# Patient Record
Sex: Female | Born: 1975 | Race: White | Hispanic: No
Health system: Western US, Academic
[De-identification: ages and names within clinical notes are randomized; demographics above are authoritative.]

## PROBLEM LIST (undated history)

## (undated) ENCOUNTER — Emergency Department (EMERGENCY_DEPARTMENT_HOSPITAL): Admission: EM | Payer: Medicare Other

## (undated) DIAGNOSIS — M549 Dorsalgia, unspecified: Secondary | ICD-10-CM

## (undated) DIAGNOSIS — E669 Obesity, unspecified: Secondary | ICD-10-CM

## (undated) DIAGNOSIS — J309 Allergic rhinitis, unspecified: Secondary | ICD-10-CM

## (undated) DIAGNOSIS — G4730 Sleep apnea, unspecified: Secondary | ICD-10-CM

## (undated) DIAGNOSIS — T148XXA Other injury of unspecified body region, initial encounter: Secondary | ICD-10-CM

## (undated) DIAGNOSIS — E119 Type 2 diabetes mellitus without complications: Secondary | ICD-10-CM

## (undated) DIAGNOSIS — I10 Essential (primary) hypertension: Secondary | ICD-10-CM

## (undated) DIAGNOSIS — F3289 Other specified depressive episodes: Secondary | ICD-10-CM

## (undated) DIAGNOSIS — J4489 Other specified chronic obstructive pulmonary disease: Secondary | ICD-10-CM

## (undated) DIAGNOSIS — K458 Other specified abdominal hernia without obstruction or gangrene: Secondary | ICD-10-CM

## (undated) DIAGNOSIS — E079 Disorder of thyroid, unspecified: Secondary | ICD-10-CM

## (undated) DIAGNOSIS — K219 Gastro-esophageal reflux disease without esophagitis: Secondary | ICD-10-CM

## (undated) DIAGNOSIS — J449 Chronic obstructive pulmonary disease, unspecified: Secondary | ICD-10-CM

## (undated) DIAGNOSIS — R519 Headache, unspecified: Secondary | ICD-10-CM

## (undated) DIAGNOSIS — M159 Polyosteoarthritis, unspecified: Secondary | ICD-10-CM

## (undated) DIAGNOSIS — L089 Local infection of the skin and subcutaneous tissue, unspecified: Secondary | ICD-10-CM

## (undated) HISTORY — DX: Morbid (severe) obesity due to excess calories: E66.01

## (undated) HISTORY — PX: PR APPENDECTOMY: 44950

## (undated) HISTORY — DX: Headache, unspecified: R51.9

## (undated) HISTORY — PX: SURGICAL HX OTHER: 99

## (undated) HISTORY — DX: Disorder of thyroid, unspecified: E07.9

## (undated) HISTORY — DX: Polyosteoarthritis, unspecified: M15.9

## (undated) HISTORY — DX: Allergic rhinitis, unspecified: J30.9

## (undated) HISTORY — PX: PR TONSILLECTOMY ONE-HALF <AGE 12: 42825

## (undated) HISTORY — PX: ADRENALECTOMY: SHX5220

## (undated) HISTORY — DX: Other specified chronic obstructive pulmonary disease: J44.89

## (undated) HISTORY — PX: BARIATRIC SURGERY: SHX5068

## (undated) HISTORY — PX: PR CHOLECYSTECTOMY: 47600

## (undated) HISTORY — PX: PR GASTRIC RSTCV W/BYP W/SM INT RCNSTJ LIMIT ABSRPJ: 43847

## (undated) HISTORY — DX: Type 2 diabetes mellitus without complications: E11.9

## (undated) HISTORY — DX: Other specified abdominal hernia without obstruction or gangrene: K45.8

## (undated) HISTORY — PX: PR LIG/TRNSXJ FLP TUBE ABDL/VAG APPR UNI/BI: 58600

## (undated) HISTORY — DX: Local infection of the skin and subcutaneous tissue, unspecified: L08.9

## (undated) HISTORY — DX: Chronic obstructive pulmonary disease, unspecified: J44.9

## (undated) HISTORY — DX: Gastro-esophageal reflux disease without esophagitis: K21.9

## (undated) HISTORY — DX: Other specified depressive episodes: F32.89

## (undated) HISTORY — DX: Sleep apnea, unspecified: G47.30

## (undated) HISTORY — DX: Essential (primary) hypertension: I10

## (undated) HISTORY — DX: Other injury of unspecified body region, initial encounter: T14.8XXA

## (undated) HISTORY — PX: CHOLECYSTECTOMY: SHX55

## (undated) HISTORY — PX: TUBAL LIGATION: SHX77

## (undated) HISTORY — PX: APPENDECTOMY: SHX54

## (undated) HISTORY — PX: GASTRIC BYPASS: SHX52

## (undated) HISTORY — PX: TONSILLECTOMY: SUR1361

## (undated) MED ORDER — BUSPIRONE HCL 10 MG OR TABS
ORAL_TABLET | ORAL | 5 refills | Status: AC
Start: 2021-02-26 — End: ?

## (undated) MED ORDER — MELOXICAM 7.5 MG OR TABS
ORAL_TABLET | ORAL | Status: AC
Start: 2018-12-15 — End: ?

## (undated) MED ORDER — TIZANIDINE HCL 4 MG OR TABS
ORAL_TABLET | ORAL | 5 refills | Status: AC
Start: 2021-02-28 — End: ?

## (undated) MED ORDER — FLUOXETINE HCL 20 MG OR CAPS
ORAL_CAPSULE | ORAL | 4 refills | Status: AC
Start: 2022-01-13 — End: ?

## (undated) MED ORDER — TIZANIDINE HCL 4 MG OR TABS
ORAL_TABLET | ORAL | 5 refills | Status: AC
Start: 2022-02-10 — End: ?

## (undated) MED ORDER — METHOCARBAMOL 750 MG OR TABS
ORAL_TABLET | ORAL | 0 refills | Status: AC
Start: 2019-01-17 — End: ?

## (undated) MED ORDER — BUPROPION HCL ER (XL) 150 MG OR TB24
EXTENDED_RELEASE_TABLET | ORAL | 5 refills | Status: AC
Start: 2019-11-07 — End: ?

## (undated) MED ORDER — PREDNISONE 20 MG OR TABS
ORAL_TABLET | ORAL | 0 refills | Status: AC
Start: 2017-10-07 — End: ?

## (undated) MED ORDER — LIDOCAINE 5 % EX PTCH
MEDICATED_PATCH | CUTANEOUS | 0 refills | Status: AC
Start: 2021-12-20 — End: ?

---

## 2004-04-04 ENCOUNTER — Emergency Department (HOSPITAL_COMMUNITY): Admission: EM | Admit: 2004-04-04 | Discharge: 2004-04-04 | Payer: Self-pay | Admitting: Emergency Medicine

## 2004-10-24 ENCOUNTER — Emergency Department (HOSPITAL_COMMUNITY): Admission: EM | Admit: 2004-10-24 | Discharge: 2004-10-24 | Payer: Self-pay | Admitting: Emergency Medicine

## 2008-12-02 ENCOUNTER — Emergency Department (HOSPITAL_BASED_OUTPATIENT_CLINIC_OR_DEPARTMENT_OTHER): Admission: EM | Admit: 2008-12-02 | Discharge: 2008-12-02 | Payer: Self-pay | Admitting: Emergency Medicine

## 2008-12-17 ENCOUNTER — Emergency Department (HOSPITAL_BASED_OUTPATIENT_CLINIC_OR_DEPARTMENT_OTHER): Admission: EM | Admit: 2008-12-17 | Discharge: 2008-12-17 | Payer: Self-pay | Admitting: Emergency Medicine

## 2009-01-22 ENCOUNTER — Ambulatory Visit (HOSPITAL_COMMUNITY): Admission: RE | Admit: 2009-01-22 | Discharge: 2009-01-22 | Payer: Self-pay | Admitting: Obstetrics and Gynecology

## 2009-01-22 ENCOUNTER — Encounter (INDEPENDENT_AMBULATORY_CARE_PROVIDER_SITE_OTHER): Payer: Self-pay | Admitting: Obstetrics and Gynecology

## 2009-03-25 ENCOUNTER — Emergency Department
Admission: EM | Admit: 2009-03-25 | Discharge: 2009-03-26 | Disposition: A | Discharge status: Home / Self Care | Payer: Medicaid Other | Attending: Emergency Medicine | Admitting: Emergency Medicine

## 2009-03-25 ENCOUNTER — Other Ambulatory Visit (HOSPITAL_BASED_OUTPATIENT_CLINIC_OR_DEPARTMENT_OTHER): Payer: Self-pay | Admitting: Emergency Medicine

## 2009-03-25 DIAGNOSIS — R9431 Abnormal electrocardiogram [ECG] [EKG]: Secondary | ICD-10-CM

## 2009-03-25 DIAGNOSIS — R079 Chest pain, unspecified: Secondary | ICD-10-CM

## 2009-03-26 LAB — X-RAY CHEST 2 VW

## 2009-03-30 ENCOUNTER — Ambulatory Visit (INDEPENDENT_AMBULATORY_CARE_PROVIDER_SITE_OTHER): Payer: Medicaid Other | Admitting: Wound Care

## 2009-03-30 ENCOUNTER — Encounter (INDEPENDENT_AMBULATORY_CARE_PROVIDER_SITE_OTHER): Payer: Self-pay | Admitting: Wound Care

## 2009-03-30 VITALS — BP 118/72 | HR 104 | Temp 98.7°F | Resp 16 | Ht 63.5 in | Wt >= 6400 oz

## 2009-03-30 DIAGNOSIS — R58 Hemorrhage, not elsewhere classified: Secondary | ICD-10-CM

## 2009-03-30 DIAGNOSIS — Z23 Encounter for immunization: Secondary | ICD-10-CM

## 2009-03-30 DIAGNOSIS — F339 Major depressive disorder, recurrent, unspecified: Secondary | ICD-10-CM

## 2009-03-30 DIAGNOSIS — R7309 Other abnormal glucose: Secondary | ICD-10-CM

## 2009-03-30 DIAGNOSIS — A6000 Herpesviral infection of urogenital system, unspecified: Secondary | ICD-10-CM

## 2009-03-30 MED ORDER — WELLBUTRIN SR 150 MG OR TB12
EXTENDED_RELEASE_TABLET | ORAL | Status: DC
Start: 2009-03-30 — End: 2009-08-24

## 2009-03-30 MED ORDER — LEXAPRO 10 MG OR TABS
ORAL_TABLET | ORAL | Status: DC
Start: 2009-03-30 — End: 2009-05-11

## 2009-03-30 NOTE — Progress Notes (Signed)
Why is patient here today? Patient is here to establish care. Patient was refrred to [place] by [facility].   Does patient need refills? Yes.   Does patient need a referral?   Does patient need a letter or need forms filled out?   Lab results?   Please check health mainenance issues and flag for the doctor:

## 2009-03-30 NOTE — Progress Notes (Signed)
Pt is a 33 year old female  here today for bruising and hernia   Reviewed history obtained by MA and agree on 03/30/2009    Hernia- has stomach area hernias for past 2 years back. They are getting bigger.  She has some pain 1-2 times per week. Like a pressure 2-3 hrs . She has a compazine from [place] and it helps. She needs to get this hernia fixed.     Chest pain - she recently was seen at [facility]. They rules out cardiac issue. She has had multiple studies in [place] - barium swallow etc which are neg.   She is too large to fit into a cardiac nuclear study and so that part of the test aws not done.   Her chest pain has resolved.     bruising started a lot. Large bruising spontanesous. Platelets normal at ER. PTT abnormal.   No blood from anywhere else.     Depression lots of death in her family. Parents died when she was young. Since then she has lost everybody she loves. Moved here to be close to a distant cousin. So far she likes it.       No records.     There is no problem list on file for this patient.       Current outpatient prescriptions   Medication Sig   . Albuterol 90 MCG/ACT IN AERS two puff as needed    . BuPROPion HCl (WELLBUTRIN SR) 150 MG OR TB12 once daily    . Eletriptan Hydrobromide (RELPAX) 40 MG OR TABS take one tablet by mouth at onset of headache    . Escitalopram Oxalate (LEXAPRO) 10 MG OR TABS once daily    . Omeprazole 20 MG OR TBEC take two capsules by mouth every day    . Prochlorperazine Maleate 10 MG OR TABS take one tablet by mouth every 6 hours as needed for nausea    . Valacyclovir HCl 1 GM OR TABS Take 1 tablet by mouth daily for supression with outbreaks . Take one tablet twice day daily    . Zolpidem Tartrate (AMBIEN) 10 MG OR TABS take one tablet at bedtime as needed        History   Substance Use Topics   . Tobacco Use: Never   . Alcohol Use: No         O/e  Gen alert    BP 118/72  Pulse 104  Temp(Src) 98.7 F (37.1 C) (Oral)  Resp 16  Ht 5' 3.5" (1.613 m)  Wt 533 lb (241.767 kg)   SpO2 97%  LMP IUD  Morbidly obese.  Arms some bruising seen  Abdomen very diff exam. + hernia seen ventral. No strangulation noted.         ASSESMENT AND PLAN  296.30 DEPRESSIVE DISORDER RECURRENT,NOS  (primary encounter diagnosis)  Comment: needs medication refilled.  Plan: has been stable on medication for longtime per pt.     278.01 MORBID OBESITY  Comment: this is a huge issue for her. Check with bariatric sx   Plan: REFERRAL TO SURGICAL OBESITY TX, REFERRAL TO         NUTRITION, LIPID PANEL, COMPREHENSIVE METABOLIC        PANEL, THYROID STIMULATING HORMONE, A1C,         ONSITE, RAPID        Has non cardiac chest pain but as the ER notes she is a high risk.        Lipids.daily  asa and wt loss are imp. See orders.     790.29 OTHER ABNORMAL GLUCOSE  Comment: Plan: REFERRAL TO SURGICAL OBESITY TX, A1C, ONSITE,         RAPID        Has had high sugars. Check a1c    V04.81 VACCINE FOR INFLUENZA  Comment: Plan: FLU VACCINE, 3 YRS & >, IM, ADMIN INFLUENZA         VIRUS VAC          She is using relpax for headaches. We did not have time to discuss but will Follow up next time  She needs CT head r/o NPH secondary to her obesity.     Greater than 50% of this visit was spent face to face with the patient in counseling and/or coordination of care, and/or discussing treatment/management options, and/or education with the patient and/or family member.  Total time spent was 30 minutes.

## 2009-04-02 ENCOUNTER — Telehealth (INDEPENDENT_AMBULATORY_CARE_PROVIDER_SITE_OTHER): Payer: Self-pay | Admitting: Wound Care

## 2009-04-02 ENCOUNTER — Other Ambulatory Visit (INDEPENDENT_AMBULATORY_CARE_PROVIDER_SITE_OTHER): Payer: Medicaid Other

## 2009-04-02 DIAGNOSIS — F339 Major depressive disorder, recurrent, unspecified: Secondary | ICD-10-CM

## 2009-04-02 DIAGNOSIS — R7309 Other abnormal glucose: Secondary | ICD-10-CM

## 2009-04-02 LAB — PROTHROMBIN & PTT
Partial Thromboplastin Time: 31 s (ref 22–35)
Partial Thromboplastin X Mean: 1.1
Prothrombin INR: 1 (ref 0.8–1.3)
Prothrombin Time Patient: 13.2 s (ref 10.7–15.6)

## 2009-04-02 LAB — COMPREHENSIVE METABOLIC PANEL
ALT (GPT): 16 U/L (ref 6–40)
AST (GOT): 20 U/L (ref 15–40)
Albumin: 3.5 g/dL (ref 3.5–5.2)
Alkaline Phosphatase (Total): 50 U/L (ref 25–100)
Anion Gap: 12 — ABNORMAL HIGH (ref 3–11)
Bilirubin (Total): 0.7 mg/dL (ref 0.2–1.3)
Calcium: 8.9 mg/dL (ref 8.9–10.2)
Carbon Dioxide, Total: 20 mEq/L — ABNORMAL LOW (ref 22–32)
Chloride: 105 mEq/L (ref 98–108)
Creatinine: 0.7 mg/dL (ref 0.2–1.1)
GFR, Calc, African American: >60 mL/min (ref >59)
GFR, Calc, European American: >60 mL/min (ref >59)
Glucose: 99 mg/dL (ref 62–125)
Potassium: 4.5 mEq/L (ref 3.7–5.2)
Protein (Total): 6.1 g/dL (ref 6.0–8.2)
Sodium: 137 mEq/L (ref 136–145)
Urea Nitrogen: 11 mg/dL (ref 8–21)

## 2009-04-02 LAB — LIPID PANEL
Cholesterol (LDL): 107 mg/dL (ref <130)
Cholesterol/HDL Ratio: 3.9
HDL Cholesterol: 43 mg/dL (ref >40)
Non-HDL Cholesterol: 125 mg/dL (ref 0–159)
Total Cholesterol: 168 mg/dL (ref <200)
Triglyceride: 90 mg/dL (ref <150)

## 2009-04-02 LAB — THYROID STIMULATING HORMONE: Thyroid Stimulating Hormone: 4.155 u[IU]/mL (ref 0.400–5.00)

## 2009-04-02 LAB — PR A1C RAPID, ONSITE: Hemoglobin A1C: 6 % (ref 4.0–6.0)

## 2009-04-02 MED ORDER — CELEXA 10 MG OR TABS
ORAL_TABLET | ORAL | Status: DC
Start: 2009-04-02 — End: 2009-06-28

## 2009-04-02 NOTE — Telephone Encounter (Signed)
Forward to Dr. Ennis Forts, please advise a an alternative to Lexapro.

## 2009-04-02 NOTE — Telephone Encounter (Signed)
Check pharmacy and send .  I have approved celexa 10mg 

## 2009-04-02 NOTE — Telephone Encounter (Signed)
Pt states her insurance does not cover this medication.  Requesting alternative be faxed to [facility] on [address].  Please call when completed.

## 2009-04-02 NOTE — Telephone Encounter (Signed)
Called patient and ask what pharmacy she goes there is not one file. Patient states she uses [facility] in [place].     Called pharmacy and Celexa is covered. Called medication in and let patient know that this was done.

## 2009-04-04 ENCOUNTER — Telehealth (INDEPENDENT_AMBULATORY_CARE_PROVIDER_SITE_OTHER): Payer: Self-pay | Admitting: Wound Care

## 2009-04-04 LAB — VITAMIN D (25 HYDROXY)
Vit D (25_Hydroxy) Total: 10.4 ng/mL — ABNORMAL LOW (ref 20.1–50.0)
Vitamin D2 (25_Hydroxy): <1 ng/mL
Vitamin D3 (25_Hydroxy): 10.4 ng/mL

## 2009-04-04 MED ORDER — VITAMIN D 50 MCG (2000 UT) OR CAPS
ORAL_CAPSULE | ORAL | Status: DC
Start: 2009-04-04 — End: 2011-10-10

## 2009-04-04 NOTE — Telephone Encounter (Signed)
Called patient and relayed message per Dr. Patni. Patient understood and did not have any further questions.

## 2009-04-04 NOTE — Telephone Encounter (Signed)
Please let pt know that     Your Vitamin D level is low. Please start Vitamin D tablets that were sent to your pharmacy and recheck levels in 2months.

## 2009-04-06 ENCOUNTER — Ambulatory Visit (HOSPITAL_BASED_OUTPATIENT_CLINIC_OR_DEPARTMENT_OTHER): Payer: Medicaid Other | Attending: Family | Admitting: Family

## 2009-04-06 DIAGNOSIS — G4733 Obstructive sleep apnea (adult) (pediatric): Secondary | ICD-10-CM

## 2009-04-11 ENCOUNTER — Ambulatory Visit (HOSPITAL_BASED_OUTPATIENT_CLINIC_OR_DEPARTMENT_OTHER): Payer: Medicaid Other | Attending: Neurology

## 2009-04-11 DIAGNOSIS — G4733 Obstructive sleep apnea (adult) (pediatric): Secondary | ICD-10-CM | POA: Insufficient documentation

## 2009-04-19 ENCOUNTER — Ambulatory Visit (INDEPENDENT_AMBULATORY_CARE_PROVIDER_SITE_OTHER): Payer: Medicaid Other | Admitting: Registered"

## 2009-04-24 NOTE — Progress Notes (Addendum)
Addended by: Keane Police) on: 04/24/2009      Modules accepted: Orders, Level of Service

## 2009-04-25 ENCOUNTER — Encounter (INDEPENDENT_AMBULATORY_CARE_PROVIDER_SITE_OTHER): Payer: Self-pay | Admitting: Wound Care

## 2009-04-25 ENCOUNTER — Ambulatory Visit (INDEPENDENT_AMBULATORY_CARE_PROVIDER_SITE_OTHER): Payer: Medicaid Other | Admitting: Wound Care

## 2009-04-25 VITALS — BP 98/80 | HR 88 | Temp 98.6°F | Resp 16 | Wt >= 6400 oz

## 2009-04-25 DIAGNOSIS — L0291 Cutaneous abscess, unspecified: Secondary | ICD-10-CM

## 2009-04-25 MED ORDER — BACTROBAN 2 % EX OINT
TOPICAL_OINTMENT | CUTANEOUS | Status: AC
Start: 2009-04-25 — End: 2009-05-01

## 2009-04-25 MED ORDER — BACTRIM DS 800-160 MG OR TABS
ORAL_TABLET | ORAL | Status: AC
Start: 2009-04-25 — End: 2009-05-04

## 2009-04-25 NOTE — Progress Notes (Signed)
Pt is a 33 year old female  here today for infection   Reviewed history obtained by MA and agree on 04/25/2009  Why is patient here today? Patient is is here with a possible abscess on the top of her head. Patient states that she notice sore yesterday while washing her hair and it has been draining since.   Does patient need refills? Yes   Does patient need a referral? No   Does patient need a letter or need forms filled out? No   Lab results? No   Please check health mainenance issues and flag for the doctor: yes .    agree with MA note above.     Started 1-2 days.thinks she scratched it during sleep study and it is getting worse. dtr had MRSa  Previous h/o of similar  no  No fever     There is no problem list on file for this patient.       Current outpatient prescriptions   Medication Sig   . Albuterol 90 MCG/ACT IN AERS two puff as needed    . BuPROPion HCl (WELLBUTRIN SR) 150 MG OR TB12 once daily    . BuPROPion HCl (WELLBUTRIN SR) 150 MG OR TB12 one tablet daily   . Cholecalciferol (VITAMIN D) 2000 UNIT OR CAPS one tablet daily    . Citalopram Hydrobromide (CELEXA) 10 MG OR TABS one tab daily    . Eletriptan Hydrobromide (RELPAX) 40 MG OR TABS take one tablet by mouth at onset of headache    . Escitalopram Oxalate (LEXAPRO) 10 MG OR TABS once daily   . Mupirocin (BACTROBAN) 2 % EX OINT Apply to affected area 3 times per day   . Omeprazole 20 MG OR TBEC take two capsules by mouth every day    . Prochlorperazine Maleate 10 MG OR TABS take one tablet by mouth every 6 hours as needed for nausea    . TRIMETHOPRIM-SULFAMETHOXAZOLE (BACTRIM DS) 800-160 MG OR TABS Take 1 tablet by mouth 2 times per day until gone   . Valacyclovir HCl 1 GM OR TABS Take 1 tablet by mouth daily for supression with outbreaks . Take one tablet twice day daily    . Zolpidem Tartrate (AMBIEN) 10 MG OR TABS take one tablet at bedtime as needed        History   Substance Use Topics   . Tobacco Use: Never   . Alcohol Use: No       Review of  patient's allergies indicates:  Allergies   Allergen Reactions   . Amoxicillin      Unsure from childhood    . Penicillins      Unsure from childhood          O/e  Gen alert    BP 98/80  Pulse 88  Temp(Src) 98.6 F (37 C) (Oral)  Resp 16  Wt 527 lb (239.046 kg)  LMP IUD  Scalp - + tender erythematous abscess seen. Mild blood on palpation.         ASSESMENT AND PLAN  682.9 CELLULITIS NOS  (primary encounter diagnosis)  Comment: bactrim for mrsa coverage. bactroban plus abx.  Allergic to PCN so should tolerate this.   Plan: CULTURE:BACT - WOUND          Procedure  Drain abscess on scalp  Verbal consent obtained yes  Area cleaned with alcohol yes and povidone  Lidocaine with Epi1% injected into the lesion  Yellow green d/c seen at injection site  Stab incision made into abscess with large amt of pus drainage  Collected and sent for culture   NO packing required and minimal drainge   Area irrigated with hydrogen peroxide and then Saline  Area cleaned and dressed  Return Monday for recheck. Ok to take showers and keep area clean.

## 2009-04-25 NOTE — Progress Notes (Signed)
Why is patient here today? Patient is is here with a possible abscess on the top of her head. Patient states that she notice sore yesterday while washing her hair and it has been draining since.   Does patient need refills? Yes   Does patient need a referral? No   Does patient need a letter or need forms filled out? No   Lab results? No   Please check health mainenance issues and flag for the doctor: yes .

## 2009-04-26 ENCOUNTER — Ambulatory Visit (INDEPENDENT_AMBULATORY_CARE_PROVIDER_SITE_OTHER): Payer: Medicaid Other | Admitting: Family Medicine

## 2009-04-26 ENCOUNTER — Ambulatory Visit (INDEPENDENT_AMBULATORY_CARE_PROVIDER_SITE_OTHER): Payer: Medicaid Other | Admitting: Registered"

## 2009-04-28 LAB — WOUND C/S W/GRAM

## 2009-04-30 ENCOUNTER — Ambulatory Visit (INDEPENDENT_AMBULATORY_CARE_PROVIDER_SITE_OTHER): Payer: Medicaid Other | Admitting: Wound Care

## 2009-05-04 ENCOUNTER — Ambulatory Visit (INDEPENDENT_AMBULATORY_CARE_PROVIDER_SITE_OTHER): Payer: Medicaid Other | Admitting: Wound Care

## 2009-05-04 ENCOUNTER — Encounter (INDEPENDENT_AMBULATORY_CARE_PROVIDER_SITE_OTHER): Payer: Self-pay | Admitting: Wound Care

## 2009-05-04 ENCOUNTER — Telehealth (INDEPENDENT_AMBULATORY_CARE_PROVIDER_SITE_OTHER): Payer: Self-pay | Admitting: Wound Care

## 2009-05-04 VITALS — BP 128/76 | HR 88 | Resp 16 | Wt >= 6400 oz

## 2009-05-04 DIAGNOSIS — R21 Rash and other nonspecific skin eruption: Secondary | ICD-10-CM

## 2009-05-04 DIAGNOSIS — B354 Tinea corporis: Secondary | ICD-10-CM

## 2009-05-04 DIAGNOSIS — K469 Unspecified abdominal hernia without obstruction or gangrene: Secondary | ICD-10-CM

## 2009-05-04 DIAGNOSIS — R1906 Epigastric swelling, mass or lump: Secondary | ICD-10-CM

## 2009-05-04 DIAGNOSIS — Z23 Encounter for immunization: Secondary | ICD-10-CM

## 2009-05-04 LAB — PR KOH SKIN SCRAPE,ONSITE: Hyphae: POSITIVE — AB

## 2009-05-04 MED ORDER — CLOTRIMAZOLE 1 % EX CREA
TOPICAL_CREAM | CUTANEOUS | Status: DC
Start: 2009-05-04 — End: 2009-06-20

## 2009-05-04 NOTE — Telephone Encounter (Signed)
Please let pt know that she has an fungus on her left arm. See orders

## 2009-05-04 NOTE — Progress Notes (Signed)
Why is patient here today? Patient is here for a one month follow up. Patient has a rash on her L arm. Patient statesthat she is also having upper abdominal pain. Patient notes that she has been having joint pain.   Does patient need refills? No   Does patient need a referral? Possible pediatry   Does patient need a letter or need forms filled out? No   Lab results? Yes   Please check health mainenance issues and flag for the doctor: yes, Tdap.

## 2009-05-04 NOTE — Progress Notes (Signed)
Pt is a 33 year old female  here today for Follow up    Reviewed history obtained by MA and agree on 05/04/2009    Scalp - her infection of her scalp has resolved.    Joint pains - her knees and ankles hurt. She has been exercising and walking.     Hernia- she has this abdominal mass in epigastric area. The [facility] will not see for this .    Has a rash left arm - months. Wants it checked. Itchy.       There is no problem list on file for this patient.       Current outpatient prescriptions   Medication Sig   . Albuterol 90 MCG/ACT IN AERS two puff as needed    . BuPROPion HCl (WELLBUTRIN SR) 150 MG OR TB12 once daily    . BuPROPion HCl (WELLBUTRIN SR) 150 MG OR TB12 one tablet daily   . Cholecalciferol (VITAMIN D) 2000 UNIT OR CAPS one tablet daily    . Citalopram Hydrobromide (CELEXA) 10 MG OR TABS one tab daily    . Eletriptan Hydrobromide (RELPAX) 40 MG OR TABS take one tablet by mouth at onset of headache    . Escitalopram Oxalate (LEXAPRO) 10 MG OR TABS once daily   . Omeprazole 20 MG OR TBEC take two capsules by mouth every day    . Prochlorperazine Maleate 10 MG OR TABS take one tablet by mouth every 6 hours as needed for nausea    . TRIMETHOPRIM-SULFAMETHOXAZOLE (BACTRIM DS) 800-160 MG OR TABS Take 1 tablet by mouth 2 times per day until gone   . Valacyclovir HCl 1 GM OR TABS Take 1 tablet by mouth daily for supression with outbreaks . Take one tablet twice day daily    . Zolpidem Tartrate (AMBIEN) 10 MG OR TABS take one tablet at bedtime as needed        History   Substance Use Topics   . Tobacco Use: Never   . Alcohol Use: No         O/e  Gen alert    BP 128/76  Pulse 88  Resp 16  Wt 531 lb (240.86 kg)  LMP IUD  Obese  Left arm- scaly erythemtous rash with central clearing.   Abdomen - very diff exam. Obese. Possible hernia.   Left foot - tenderness over the arch area. No other pain noted. Feet are small      ASSESMENT AND PLAN  789.36 ABD/PEL SWELL/MASS/LUMP EPIGASTR  (primary encounter  diagnosis)  Comment: get [place]. She is not able to fit into a machine.   Plan: US EXAM, ABDOM, COMPLETE            553.9 HERNIA NOS  Comment:   Plan: REFERRAL TO GENERAL SURGERY            782.1 NONSPECIF SKIN ERUPT NEC  Comment:ringworm see orders. She had to leave -see TE   Plan: KOH SKIN SCRAPE,ONSITE            V06.2 VACCINE FOR DTP + TAB  Comment:   Plan: TDAP VACCINE >7 IM, IMMUNIZATION ADMIN, SINGLE         IMM        Has kids.     Joint pains. From her wt. i dont think her body was made to support 500 pds. She agrees. I would be happy to do an inx with labs etc for things like RA /inflam disorders but unlikely. Return if  needed.   revd diet and need to meet with dietician. revd exercise. [facility]ric [facility] at [facility] has sent [facility]. Will send her to [facility] loss surgery .  Her insurance has not approved this yet which is unbelievable . She definitely needs help with this wt. She is working towards losing 50 pds which is 10% of body wt for her insurance.     Ward Givens, MD

## 2009-05-07 DIAGNOSIS — R1013 Epigastric pain: Secondary | ICD-10-CM | POA: Insufficient documentation

## 2009-05-07 DIAGNOSIS — R079 Chest pain, unspecified: Secondary | ICD-10-CM

## 2009-05-07 NOTE — Telephone Encounter (Signed)
Called patient and left a message for patient to return clinics call.

## 2009-05-08 ENCOUNTER — Emergency Department
Admission: EM | Admit: 2009-05-08 | Discharge: 2009-05-08 | Disposition: A | Discharge status: Home / Self Care | Payer: Medicaid Other | Attending: Emergency Medicine | Admitting: Emergency Medicine

## 2009-05-08 NOTE — Telephone Encounter (Signed)
Relayed message to pt

## 2009-05-09 ENCOUNTER — Ambulatory Visit: Payer: Medicaid Other | Attending: Wound Care

## 2009-05-09 ENCOUNTER — Telehealth (INDEPENDENT_AMBULATORY_CARE_PROVIDER_SITE_OTHER): Payer: Self-pay | Admitting: Wound Care

## 2009-05-09 DIAGNOSIS — R1901 Right upper quadrant abdominal swelling, mass and lump: Secondary | ICD-10-CM | POA: Insufficient documentation

## 2009-05-09 DIAGNOSIS — R1013 Epigastric pain: Secondary | ICD-10-CM

## 2009-05-09 LAB — PR ULTRASOUND ABDOMINAL R-T W/IMAGE LIMITED

## 2009-05-09 NOTE — Telephone Encounter (Signed)
Done

## 2009-05-09 NOTE — Telephone Encounter (Signed)
See letter   Normal. No hernia seen.   It is just fat per radiologist.

## 2009-05-09 NOTE — Telephone Encounter (Signed)
CONFIRMED PHONE NUMBER: [phone]   CALLERS FIRST AND LAST NAME: Pershing Proud  FACILITY NAME: N/A TITLE: N/A  CALLERS RELATIONSHIP: Self  RETURN CALL: General message OK    SUBJECT: Results Request   REASON FOR REQUEST: U/S Results Request    WHO ORDERED THE TEST: [name], MD  TYPE OF TEST? Imaging (U/S)  WHERE WAS THE TEST DONE:   WHEN WAS THE TEST DONE: 05/09/09

## 2009-05-09 NOTE — Telephone Encounter (Signed)
Informed patient of below message. Patient is requesting to be sent to a gastroenterologist  Because she is still having pain.

## 2009-05-09 NOTE — Telephone Encounter (Signed)
Forward to Dr. Ennis Forts, please advise on [place] report.

## 2009-05-10 ENCOUNTER — Ambulatory Visit (INDEPENDENT_AMBULATORY_CARE_PROVIDER_SITE_OTHER): Payer: Medicaid Other | Admitting: Registered"

## 2009-05-10 DIAGNOSIS — R7309 Other abnormal glucose: Secondary | ICD-10-CM

## 2009-05-10 NOTE — Progress Notes (Signed)
Pershing Proud was referred for Medical Nutrition Therapy by Dr.Patni.    SUBJECTIVE: Not sure of purpose of today's visit.      Lifestyle:  Moved to area from  [place] two months ago. Has  31yo and 58 yo kids;  Living with family of kids' father "They are like my mother and sister." . "I wanted to get away from my kids father."  Hasn't worked in 6 to 7 years; in the process of getting back with [employer].  Spends days taking care of kids, housework.    Exercise: Hasn't walked two blocks in more than 5 years; did all by self yesterday.  Admitted that she has done some walking in the past "but not every day."  Has used electric cart in grocery cart for at least 4 years.      Hasn't been in a mall for much longer than that.      Weight: States that weight gain started when she was a child. Parents separated when she was 8 or 9 and was living with her father who passed away (due to trauma) when she was 10, lost mother at 86yo.  Only child.  mom with massive heart attack at age 52.  Grandma passed away a year later.  Grandpa raised pt and "spoiled me rotten."    Weight concerns pt "Wasn't active until I moved here."  Walks daughter to bus stop; has started walking more.  Feels good about that   Over 350lbs  at age 60.     33 yo [facility] [place] - little recollection.  Stated, "I don't remember much about my childhood."     Diet pills - Meridia, Xenical;    Weight Watchers with friend, friends mother;  Lost 30 lbs and then got pregnant.   Would be very interested in gastric bypass surgery.      Diet:  Food intolerances: denies   Food aversions:some uncooked vegies;    Pt and cousin are trying to figure out best diet for the family.  Cousin has lupus.  "She holds me up." Aunt has diabetes.       Diet recall   6am got up   6:30 diet pop   ultrasound -   might go back to bed   9 -10am  usually eats leftovers - just some meat  or bacon, eggs or sausage, eggs, diet pop or water   noon to  2pm will eat with son - leftovers or sandwich or chips; diet soda   trying to limit junk food   4-5pm banana, pineapple, spinach - 20 oz; might also have banana   8to 9pm pigs in blanket - 6small ones; fun sized candy bar.  Glass of water   glass of soda    "Sunday dinner - baked chicken, green beans, brocolli, stove top stuffing.     Doesn't really get hungry or notice when full; denies over eating though notes that was over-eating recently and resumed anti-depressants which managed it.       Got GDM, preeclampsia with son;     60" 0lbs when went into hospital at [redacted] weeks gestation; at delivery 536lbs about 5 years ago       Groceries - every 1-2 weeks;  [place] of food in home;  [facility] for son.       Vitamin, mineral or herbal supplements: vit D   Alcohol intake: denies   See medical history, medications.  OBJECTIVE: Ht:63.5"    Wt:531 (05/04/09)   BMI: 93;  A1c 6.0 (04/02/09)     See MD order.       ASSESSMENT:  Super-obese  33 yo woman with lengthy history of weight issues.  With prediabetes per A1c 6.0.   Pt noted that she is in the process of change since moving the the area from [place] with some close friends and her children.    On disability and pursuing Voc Rehab   Pt admits that she is making herself walk more but feet, ankles and knees are sore, likely due to very very little regular walking until recently.    Diet recall indicates current meal pattern of three meals/day.  Ample meat per [employer] preferences though pt likes cooked vegies though they may have added fat   Pt is drinking her fruit though does also eat fruit.    Little current evidence of over-eating though pt admits to eating in the past due to depression.   Pt has successfully lost weight on Weight Watchers;    Denies difficulty with access to food   Would be interested in gastric bypass surgery if it is available to her.     PLAN:   commended changes that pt has made to date, especially with exercise   Gave 1800 kcal meal  patterns as guides for family    Discussed gastric bypass preparation and offered to assist pt with this    Avoid drinking fruit in smoothie   Urged as much activity as possible     Pt signed waiver      Spero Curb, MNS RD CD CDE

## 2009-05-11 ENCOUNTER — Emergency Department
Admission: EM | Admit: 2009-05-11 | Discharge: 2009-05-12 | Disposition: A | Discharge status: Home / Self Care | Payer: Medicaid Other | Attending: Emergency Medicine | Admitting: Emergency Medicine

## 2009-05-11 ENCOUNTER — Encounter (INDEPENDENT_AMBULATORY_CARE_PROVIDER_SITE_OTHER): Payer: Self-pay | Admitting: Wound Care

## 2009-05-11 ENCOUNTER — Ambulatory Visit (INDEPENDENT_AMBULATORY_CARE_PROVIDER_SITE_OTHER): Payer: Medicaid Other | Admitting: Wound Care

## 2009-05-11 VITALS — BP 140/100 | HR 86 | Temp 98.1°F | Resp 16 | Wt >= 6400 oz

## 2009-05-11 DIAGNOSIS — R109 Unspecified abdominal pain: Secondary | ICD-10-CM | POA: Insufficient documentation

## 2009-05-11 DIAGNOSIS — R1013 Epigastric pain: Secondary | ICD-10-CM

## 2009-05-11 MED ORDER — TYLENOL WITH CODEINE #3 300-30 MG OR TABS
ORAL_TABLET | ORAL | Status: DC
Start: 2009-05-11 — End: 2013-03-08

## 2009-05-11 MED ORDER — ATENOLOL 25 MG OR TABS
ORAL_TABLET | ORAL | Status: DC
Start: 2009-05-11 — End: 2009-06-04

## 2009-05-11 MED ORDER — CELEXA 20 MG OR TABS
ORAL_TABLET | ORAL | Status: DC
Start: 2009-05-11 — End: 2009-09-05

## 2009-05-11 NOTE — Progress Notes (Signed)
Pt is a 33 year old female  here today for abdominal.   Reviewed history obtained by MA and agree on 05/11/2009    Location mid epigastric abdominal area.   Duration 2 years. At times it gets better. Then worse. She went to ER 2 days ago as it was worse.  We also did a [place] which did not show hernia. She has a CT scan which shows ventral hernia but these are from Feb 2009.   Pain level -8. Cannot sleep.   Worse with nothing just gets worse. Not worse after eating.   Better with not sure  Used  Nothing. Omeprazole which sometimes doesn't work.     There is no problem list on file for this patient.       Current outpatient prescriptions   Medication Sig   . Albuterol 90 MCG/ACT IN AERS two puff as needed    . BuPROPion HCl (WELLBUTRIN SR) 150 MG OR TB12 once daily    . BuPROPion HCl (WELLBUTRIN SR) 150 MG OR TB12 one tablet daily   . Cholecalciferol (VITAMIN D) 2000 UNIT OR CAPS one tablet daily    . Citalopram Hydrobromide (CELEXA) 10 MG OR TABS one tab daily    . Clotrimazole 1 % EX CREA apply to rash three times per day   . Eletriptan Hydrobromide (RELPAX) 40 MG OR TABS take one tablet by mouth at onset of headache    . Omeprazole 20 MG OR TBEC take two capsules by mouth every day    . Prochlorperazine Maleate 10 MG OR TABS take one tablet by mouth every 6 hours as needed for nausea    . Valacyclovir HCl 1 GM OR TABS Take 1 tablet by mouth daily for supression with outbreaks . Take one tablet twice day daily    . Zolpidem Tartrate (AMBIEN) 10 MG OR TABS take one tablet at bedtime as needed        History   Substance Use Topics   . Tobacco Use: Never   . Alcohol Use: No         review of systems:    cardiovascular Negative   genitourinary  Negative . Not sexually active and has an IUD  Mood- feels that she needs celexa increased. At times she gets frustarted. She was on a higher dose of lexapro.   No Si or HI. Sleep is good. She just wants to stay motivated to lose wt so she can get her surgery done. Weather change is  not helping either.     O/e  Gen alert    BP 140/100  Pulse 86  Temp(Src) 98.1 F (36.7 C) (Oral)  Resp 16  Wt 534 lb (242.221 kg)  LMP IUD  BP is elevated.  RS-LUNGS CLEAR TO AUSCULTATION,NO RALESRHONCHI OR WHEEZES  [facility]- RRR no murmurs or rubs  Abdominal exam is limited secondary to her SIZE.   nontender , no gaurding or rigidity.       ASSESMENT AND PLAN  789.06 ABDOMINAL PAIN EPIGASTRIC  (primary encounter diagnosis)  Comment: check for any hernias or issues with esophageal motility.  Plan: H. PYLORI BREATH TEST, CINE ESOPHAGRAM/PHARYNX,        TYLENOL WITH CODEINE #3 300-30 MG OR TABS        Barium test. H pylori. Codeine tab prn for pain incase msk. Hard to tell with her body habitus    Depression - increase celexa to 20mg  daily. Return 55m to see how she is doing.  Instructions given to patient and/or family re: when to call our office and when to seek emergency care/call 911 etc.     HTN- high. Needs control. Atenolol 25mg  daily . Discussed medication dosage, usage, goals of therapy, and side effects.   Has used labetolol during her pregnancies.       Greater than 50% of this visit was spent face to face with the patient in counseling and/or coordination of care, and/or discussing treatment/management options, and/or education with the patient and/or family member.  Total time spent was 25 minutes.    Ward Givens, MD

## 2009-05-11 NOTE — Progress Notes (Signed)
Why is patient here today? Patient is here for a ER follow up on upper abdominal pain. Patient states that she has had the pain on and off for the last two years. Patient states that this episode is the worse yet. Patient also states that her WBC are always high.   Does patient need refills? Possible, patient would like to discuss getting depression medication increased.   Does patient need a referral? Possible   Does patient need a letter or need forms filled out? No   Lab results? Yes  Please check health mainenance issues and flag for the doctor: no

## 2009-05-12 LAB — H. PYLORI BREATH TEST
H. Pylori Breath Test Interp: NEGATIVE
H. Pylori Breath Test Interp: NEGATIVE
H. Pylori Breath Test Result: NEGATIVE

## 2009-05-17 ENCOUNTER — Telehealth (INDEPENDENT_AMBULATORY_CARE_PROVIDER_SITE_OTHER): Payer: Self-pay | Admitting: Wound Care

## 2009-05-17 NOTE — Telephone Encounter (Signed)
Called patient and left a message for patient to return clinics call.

## 2009-05-17 NOTE — Telephone Encounter (Signed)
CONFIRMED PHONE NUMBER: [phone]   CALLERS FIRST AND LAST NAME: Pershing Proud  FACILITY NAME: N/A TITLE: N/A  CALLERS RELATIONSHIP:Father  RETURN CALL: Detailed message on voicemail only    SUBJECT: Results Request   REASON FOR REQUEST: Results    WHO ORDERED THE TEST: Patni  TYPE OF TEST? Other Pt doesn't remember what the test was   WHERE WAS THE TEST DONE: [facility]  WHEN WAS THE TEST DONE: 05/11/2009

## 2009-05-18 NOTE — Telephone Encounter (Signed)
Forward to Dr. Ennis Forts, please advise on H. Pylori result.

## 2009-05-18 NOTE — Telephone Encounter (Signed)
Normal result.   No h pylori.

## 2009-05-21 ENCOUNTER — Encounter (HOSPITAL_BASED_OUTPATIENT_CLINIC_OR_DEPARTMENT_OTHER): Payer: Self-pay

## 2009-05-22 ENCOUNTER — Ambulatory Visit: Payer: Medicaid Other | Attending: Wound Care

## 2009-05-22 DIAGNOSIS — R1013 Epigastric pain: Secondary | ICD-10-CM | POA: Insufficient documentation

## 2009-05-22 LAB — PR RADIOLOGIC EXAM PHRNX&/CRV ESOPH CONTRAST STUDY

## 2009-05-29 ENCOUNTER — Encounter (HOSPITAL_BASED_OUTPATIENT_CLINIC_OR_DEPARTMENT_OTHER): Payer: Medicaid Other

## 2009-05-30 ENCOUNTER — Ambulatory Visit (INDEPENDENT_AMBULATORY_CARE_PROVIDER_SITE_OTHER): Payer: Medicaid Other | Admitting: Wound Care

## 2009-06-04 ENCOUNTER — Encounter (INDEPENDENT_AMBULATORY_CARE_PROVIDER_SITE_OTHER): Payer: Self-pay | Admitting: Wound Care

## 2009-06-04 ENCOUNTER — Ambulatory Visit (INDEPENDENT_AMBULATORY_CARE_PROVIDER_SITE_OTHER): Payer: Medicaid Other | Admitting: Wound Care

## 2009-06-04 VITALS — BP 150/96 | HR 84 | Temp 98.6°F | Resp 16 | Wt >= 6400 oz

## 2009-06-04 DIAGNOSIS — I10 Essential (primary) hypertension: Secondary | ICD-10-CM

## 2009-06-04 DIAGNOSIS — R7309 Other abnormal glucose: Secondary | ICD-10-CM

## 2009-06-04 DIAGNOSIS — E559 Vitamin D deficiency, unspecified: Secondary | ICD-10-CM

## 2009-06-04 DIAGNOSIS — B3731 Acute candidiasis of vulva and vagina: Secondary | ICD-10-CM

## 2009-06-04 LAB — PR A1C RAPID, ONSITE: Hemoglobin A1C: 6 % (ref 4.0–6.0)

## 2009-06-04 MED ORDER — ATENOLOL 25 MG OR TABS
ORAL_TABLET | ORAL | Status: DC
Start: 2009-06-04 — End: 2009-07-04

## 2009-06-04 MED ORDER — DIFLUCAN 150 MG OR TABS
ORAL_TABLET | ORAL | Status: AC
Start: 2009-06-04 — End: 2009-06-05

## 2009-06-04 NOTE — Progress Notes (Signed)
Pt is a 33 year old female  here today for followup  Reviewed history obtained by MA and agree on 06/04/2009    -Patient states that she has a vaginal yeast infection. She also gets infection when she changes soap  Itching. She has no sexual partners denies any exposure to STD.  Doesn't want a vaginal check today as she has another appointment to go to. Her daughter passed out this morning and she is extremely stressed about that    -Patient also needs vitamin D level checked.    - Patient states that her blood sugars in the morning are running around 140. This is a change for her     we did a1c which was at 6.0/     There are no active problems to display for this patient.       Current outpatient prescriptions   Medication Sig   . Albuterol 90 MCG/ACT IN AERS two puff as needed    . Atenolol 25 MG OR TABS one and half tablet daily -dose increase   . BuPROPion HCl (WELLBUTRIN SR) 150 MG OR TB12 once daily    . BuPROPion HCl (WELLBUTRIN SR) 150 MG OR TB12 one tablet daily   . Cholecalciferol (VITAMIN D) 2000 UNIT OR CAPS one tablet daily    . Citalopram Hydrobromide (CELEXA) 10 MG OR TABS one tab daily    . Citalopram Hydrobromide (CELEXA) 20 MG OR TABS dose increase - one daily   . Clotrimazole 1 % EX CREA apply to rash three times per day   . Eletriptan Hydrobromide (RELPAX) 40 MG OR TABS take one tablet by mouth at onset of headache    . Fluconazole (DIFLUCAN) 150 MG OR TABS Take 1 tablet 1 time only   . Omeprazole 20 MG OR TBEC take two capsules by mouth every day    . Prochlorperazine Maleate 10 MG OR TABS take one tablet by mouth every 6 hours as needed for nausea    . Valacyclovir HCl 1 GM OR TABS Take 1 tablet by mouth daily for supression with outbreaks . Take one tablet twice day daily    . Zolpidem Tartrate (AMBIEN) 10 MG OR TABS take one tablet at bedtime as needed        History   Substance Use Topics   . Tobacco Use: Never   . Alcohol Use: No         O/e  Gen alert    BP 150/96  Pulse 84  Temp(Src)  98.6 F (37 C) (Oral)  Resp 16  Wt 542 lb (245.85 kg)  LMP IUD  RS-lungs clear to auscultation,no ralesrhonchi or wheezes  CVS- RRR no murmurs or rubs  GU refuses.       ASSESMENT AND PLAN    401.1 BENIGN HYPERTENSION  (primary encounter diagnosis)  Comment: Increase medicine to atenolol 37.5 daily.   Plan: May increase to 50 mg depending on blood pressure    790.29 OTHER ABNORMAL GLUCOSE  Comment: Plan: A1C, ONSITE, RAPID        Recheck    268.9 VITAMIN D DEFICIENCY NOS  Comment: Plan: VITAMIN D (25 HYDROXY)            112.1 CANDIDAL VULVOVAGINITIS  Comment: This diagnosis is without exam as patient refuses.   Plan: We'll treat symptoms. If symptoms persist she'll need a vaginal check    Ward Givens, MD

## 2009-06-04 NOTE — Progress Notes (Addendum)
Addended by: STOVER, DONNA L on: 06/04/2009      Modules accepted: Orders

## 2009-06-04 NOTE — Progress Notes (Signed)
Why is patient here today? Patient is here for a one month follow up. Patient would like to discuss allergies. Patient also statesthat she might have a vaginal yeast infection and behind her knees.   Does patient need refills? Yes   Does patient need a referral? No   Does patient need a letter or need forms filled out? No   Lab results? No   Please check health mainenance issues and flag for the doctor:

## 2009-06-04 NOTE — Telephone Encounter (Signed)
Discussed at appt today

## 2009-06-05 ENCOUNTER — Encounter (HOSPITAL_BASED_OUTPATIENT_CLINIC_OR_DEPARTMENT_OTHER): Payer: Medicaid Other

## 2009-06-06 ENCOUNTER — Emergency Department
Admission: EM | Admit: 2009-06-06 | Discharge: 2009-06-06 | Disposition: A | Discharge status: Home / Self Care | Payer: Medicaid Other | Attending: Acute Care | Admitting: Acute Care

## 2009-06-06 DIAGNOSIS — R1031 Right lower quadrant pain: Secondary | ICD-10-CM | POA: Insufficient documentation

## 2009-06-06 DIAGNOSIS — R109 Unspecified abdominal pain: Secondary | ICD-10-CM | POA: Insufficient documentation

## 2009-06-06 LAB — VITAMIN D (25 HYDROXY)
Vit D (25_Hydroxy) Total: 25.8 ng/mL (ref 20.1–50.0)
Vitamin D2 (25_Hydroxy): <1 ng/mL
Vitamin D3 (25_Hydroxy): 25.8 ng/mL

## 2009-06-19 ENCOUNTER — Other Ambulatory Visit (INDEPENDENT_AMBULATORY_CARE_PROVIDER_SITE_OTHER): Payer: Self-pay | Admitting: Wound Care

## 2009-06-19 NOTE — Telephone Encounter (Signed)
Last OV 06/04/09   Dr Carma Lair prescribed before.

## 2009-06-19 NOTE — Telephone Encounter (Signed)
CONFIRMED PHONE NUMBER:   CALLERS FIRST AND LAST NAME: Fara Boros    CALLERS RELATIONSHIP:OTHER:   RETURN CALL: General message OK    SUBJECT: Requesting Orders   REASON FOR REQUEST: Faxed request for Diabetic supplies on 06/07/09 and have heard back will re-fax request    ORDERING PROVIDER: [name], MD    REQUESTS AN ORDER FOR: Other Diabetic supplies  ABLE TO PICK UP LAB SLIP AT FRONT DESK: NO

## 2009-06-20 ENCOUNTER — Encounter (INDEPENDENT_AMBULATORY_CARE_PROVIDER_SITE_OTHER): Payer: Self-pay | Admitting: Wound Care

## 2009-06-20 ENCOUNTER — Ambulatory Visit (INDEPENDENT_AMBULATORY_CARE_PROVIDER_SITE_OTHER): Payer: Medicaid Other | Admitting: Wound Care

## 2009-06-20 VITALS — BP 138/86 | HR 80 | Temp 98.5°F | Resp 16 | Wt >= 6400 oz

## 2009-06-20 DIAGNOSIS — R079 Chest pain, unspecified: Secondary | ICD-10-CM

## 2009-06-20 DIAGNOSIS — Z8249 Family history of ischemic heart disease and other diseases of the circulatory system: Secondary | ICD-10-CM

## 2009-06-20 DIAGNOSIS — M25529 Pain in unspecified elbow: Secondary | ICD-10-CM

## 2009-06-20 DIAGNOSIS — F339 Major depressive disorder, recurrent, unspecified: Secondary | ICD-10-CM

## 2009-06-20 MED ORDER — KETOCONAZOLE 2 % EX CREA
TOPICAL_CREAM | CUTANEOUS | Status: DC
Start: 2009-06-20 — End: 2010-11-04

## 2009-06-20 MED ORDER — MELOXICAM 15 MG OR TABS
ORAL_TABLET | ORAL | Status: DC
Start: 2009-06-20 — End: 2009-10-09

## 2009-06-20 NOTE — Telephone Encounter (Signed)
She does not have diabetes per our lab.    She does have prediabetes or glucose intolerance.   We dont give them a meter for prediabetes.

## 2009-06-20 NOTE — Progress Notes (Signed)
Pt is a 33 year old female  here today for Follow up    Reviewed history obtained by MA and agree on 06/20/2009    Abnormal sugar- pt has prediabetes. She had gesttional diabetes and wanted to know if she should be checking her sugars. She is not on any medication . Last 2 A1c 6.0/    Left elbow. Larey Seat last month. Xray at [place] - normal. Since then the elbow is causing a cracking noise. She has no pain.just wants it checked. Thinks it is healing well.    Rash behind R knee. Hurts. Red and swollen area. She cannot see it.     Depression - fairly stable. She would like an opinion from the psychiatrist. Nada Boozer are not good for her. She feels extremely fatigued.  No Si or HI.     Also left chest pain started 2 days back. Only left breast area and under left breast. She is worried because her mom died of MI at 76.  No cp or sob. Doesn't wake her up from sleep. Hurts to touch area.      There are no active problems to display for this patient.       Current outpatient prescriptions   Medication Sig   . Albuterol 90 MCG/ACT IN AERS two puff as needed    . Atenolol 25 MG OR TABS one and half tablet daily -dose increase   . BuPROPion HCl (WELLBUTRIN SR) 150 MG OR TB12 once daily    . BuPROPion HCl (WELLBUTRIN SR) 150 MG OR TB12 one tablet daily   . Cholecalciferol (VITAMIN D) 2000 UNIT OR CAPS one tablet daily    . Citalopram Hydrobromide (CELEXA) 10 MG OR TABS one tab daily    . Citalopram Hydrobromide (CELEXA) 20 MG OR TABS dose increase - one daily   . Clotrimazole 1 % EX CREA apply to rash three times per day   . Eletriptan Hydrobromide (RELPAX) 40 MG OR TABS take one tablet by mouth at onset of headache    . Omeprazole 20 MG OR TBEC take two capsules by mouth every day    . Prochlorperazine Maleate 10 MG OR TABS take one tablet by mouth every 6 hours as needed for nausea    . Valacyclovir HCl 1 GM OR TABS Take 1 tablet by mouth daily for supression with outbreaks . Take one tablet twice day daily    . Zolpidem  Tartrate (AMBIEN) 10 MG OR TABS take one tablet at bedtime as needed        History   Substance Use Topics   . Tobacco Use: Never   . Alcohol Use: No         review of systems:    constitutional Negative   respiratory Negative   neurological Negative       O/e  Gen alert    BP 138/86  Pulse 80  Temp(Src) 98.5 F (36.9 C) (Oral)  Resp 16  Wt 538 lb (244.035 kg)  LMP IUD  Morbidly obese  Left breast -chest wall - tenderness over entire chest wall left rib 4-8th rib.  RS-lungs clear to auscultation,no ralesrhonchi or wheezes  CVS- RRR no murmurs or rubs  Left elbow no tenderness or erythema. ROM normal. There is crepitus heard with flex/ext of elbow.   No tenderness over the olecranon area.  Pulse intact.   R knee- popliteal area. + moist erythematous rash with satelite lesions.         ASSESMENT  AND PLAN  786.50 CHEST PAIN NOS  (primary encounter diagnosis)  Comment: TRY mobic. Cannot use other nsaids.   Plan: REFERRAL TO CARDIOLOGY        No e/o cardiac issue today. Seems msk today. Chest wall pain. Use prn tylenol. She herself is high risk secondary to her wt.     296.30 DEPRESSIVE DISORDER RECURRENT,NOS  Comment:  Plan: REFERRAL TO PSYCHIATRY        Psych to optimize medication . She may be going thru bariatric surgery    V17.3 FAMILY HX-ISCHEM HEART DIS  Comment:  Plan: REFERRAL TO CARDIOLOGY            719.42 JOINT PAIN-UP/ARM  Comment: xray.normal.   Plan: X-RAY ELBOW 3+ VW          Candidiasis. Behind R knee. See orders. Clotrimazole doesn't work for her. Use alternate medication .    Greater than 50% of this visit was spent face to face with the patient in counseling and/or coordination of care, and/or discussing treatment/management options, and/or education with the patient and/or family member.  Total time spent was 30 minutes.    Ward Givens, MD

## 2009-06-20 NOTE — Progress Notes (Signed)
Why is patient here today? Patient is here and c/o knee pain, and possible yeast behind R knee. Patient also c/o chest pain for the last 2 days. Patient notes that her upper back started to hurt today. Patient also needs to follow up on derm concern on her arms.   Does patient need refills? Yes, needs to discuss blood pressure medication.   Does patient need a referral? Yes, psych   Does patient need a letter or need forms filled out? No   Lab results? No   Please check health mainenance issues and flag for the doctor:

## 2009-06-20 NOTE — Telephone Encounter (Signed)
See visit.

## 2009-06-21 ENCOUNTER — Encounter (HOSPITAL_BASED_OUTPATIENT_CLINIC_OR_DEPARTMENT_OTHER): Payer: Medicaid Other | Admitting: Family

## 2009-06-21 LAB — PR X-RAY ELBOW 3+ VW

## 2009-06-25 ENCOUNTER — Telehealth (INDEPENDENT_AMBULATORY_CARE_PROVIDER_SITE_OTHER): Payer: Self-pay | Admitting: Wound Care

## 2009-06-25 NOTE — Telephone Encounter (Signed)
Triage Nurse Telephone Encounter  Chief Complaint: Abdominal pain  Reviewed Problem List, Medications and Allergies: YES    Description of symptoms: Pt reports chronic belly pain, but admits worsening pain in past few days since started on Mobic.  Describes as more on Left upper bra area, denies n/v, nor unusual abdominal distension or stools.  No fever, nor other sx of illness.  Pt has been taking medication with food.     ROS: positive for Gastric Bypass candidate  Protocol used for assessment: Abdominal pain and Epocrates medication information  Recommended disposition: Consult with primary or covering provider  Caller agrees:  YES    PLAN:  Encouraged to increase fluids when taking medication and will forward to PCP for further recommendations.   Home care instructions provided:  YES   Instructed to call back or seek tx if sxs worsen or has new concerns: YES   Caller understands:  YES  Follow up call needed  YES  Reference used: Briggs 3rd edition, Telephone Triage Protocols for Nurses

## 2009-06-25 NOTE — Telephone Encounter (Signed)
Called patient and relayed message per Dr. Ennis Forts. Melany understood and did not have any further questions.

## 2009-06-25 NOTE — Telephone Encounter (Signed)
She can stop the mobic

## 2009-06-26 ENCOUNTER — Encounter (HOSPITAL_BASED_OUTPATIENT_CLINIC_OR_DEPARTMENT_OTHER): Payer: Medicaid Other | Admitting: Psychiatry

## 2009-06-26 ENCOUNTER — Other Ambulatory Visit (INDEPENDENT_AMBULATORY_CARE_PROVIDER_SITE_OTHER): Payer: Self-pay | Admitting: Wound Care

## 2009-06-26 ENCOUNTER — Other Ambulatory Visit: Payer: Self-pay

## 2009-06-26 DIAGNOSIS — F339 Major depressive disorder, recurrent, unspecified: Secondary | ICD-10-CM

## 2009-06-26 NOTE — Telephone Encounter (Signed)
No phone number has been provided in this TE, unable to complete.

## 2009-06-28 MED ORDER — CITALOPRAM HYDROBROMIDE 10 MG OR TABS
ORAL_TABLET | ORAL | Status: DC
Start: 2009-06-28 — End: 2009-07-04

## 2009-06-28 NOTE — Telephone Encounter (Signed)
Done. Thanks.

## 2009-06-28 NOTE — Telephone Encounter (Signed)
Medication Refill Documentation    Name of Medication: Celexa    Prescribing provider:  Angie Fava, MD  Protocol: Adult:  ANTIDEPRESSANTS - OK to refill for 3 months unless last chart note specifies otherwise, patient must be seen annually.  Refill for only 1 month if not seen in the last 12 months and advise pt the yearly visit is due.    Date script was written: 05/11/09, quantity: 1, # refills: 1    Date last seen for this issue:  06/20/09  Date of last OV: 06/20/09  Next scheduled appointment date: 07/04/2009      Interaction warning with NSAIDS. Ok to fill?

## 2009-07-03 ENCOUNTER — Ambulatory Visit (HOSPITAL_BASED_OUTPATIENT_CLINIC_OR_DEPARTMENT_OTHER): Payer: Medicaid Other | Attending: Surgery

## 2009-07-03 DIAGNOSIS — R1012 Left upper quadrant pain: Secondary | ICD-10-CM | POA: Insufficient documentation

## 2009-07-04 ENCOUNTER — Ambulatory Visit (INDEPENDENT_AMBULATORY_CARE_PROVIDER_SITE_OTHER): Payer: Medicaid Other | Admitting: Wound Care

## 2009-07-04 VITALS — BP 150/90 | HR 68 | Temp 97.8°F | Resp 16 | Wt >= 6400 oz

## 2009-07-04 DIAGNOSIS — I10 Essential (primary) hypertension: Secondary | ICD-10-CM

## 2009-07-04 DIAGNOSIS — B3789 Other sites of candidiasis: Secondary | ICD-10-CM

## 2009-07-04 DIAGNOSIS — J309 Allergic rhinitis, unspecified: Secondary | ICD-10-CM

## 2009-07-04 MED ORDER — CETIRIZINE HCL 10 MG OR TABS
ORAL_TABLET | ORAL | Status: DC
Start: 2009-07-04 — End: 2009-12-14

## 2009-07-04 MED ORDER — ATENOLOL 50 MG OR TABS
ORAL_TABLET | ORAL | Status: DC
Start: 2009-07-04 — End: 2009-10-17

## 2009-07-04 NOTE — Progress Notes (Signed)
Pt is a 33 year old female  here today for depression  Reviewed history obtained by MA and agree on 07/04/2009    Allergies - she has a lot of runny nose and sneeezing. The weather affects it.  No fever and chills . No chest pains or sob. No wheezing.   Some cough.     Rash - better with ketoconazole treatment but now has it on R arm. She is using the same ointment on different rashes and it is helping.     HTn- hard for her to take care of her BP. The 37.5 mg is not working well. Plus it is hard for her to cut it.       There are no active problems to display for this patient.       Current outpatient prescriptions   Medication Sig   . Albuterol 90 MCG/ACT IN AERS two puff as needed    . Atenolol 25 MG OR TABS one and half tablet daily -dose increase   . BuPROPion HCl (WELLBUTRIN SR) 150 MG OR TB12 once daily    . BuPROPion HCl (WELLBUTRIN SR) 150 MG OR TB12 one tablet daily   . Cholecalciferol (VITAMIN D) 2000 UNIT OR CAPS one tablet daily    . Citalopram Hydrobromide (CELEXA) 10 MG OR TABS one tab daily    . Citalopram Hydrobromide (CELEXA) 20 MG OR TABS dose increase - one daily   . Eletriptan Hydrobromide (RELPAX) 40 MG OR TABS take one tablet by mouth at onset of headache    . Ketoconazole 2 % EX CREA apply to rash area three times a day   . Meloxicam (MOBIC) 15 MG OR TABS one tablet daily for pain   . Omeprazole 20 MG OR TBEC take two capsules by mouth every day    . Prochlorperazine Maleate 10 MG OR TABS take one tablet by mouth every 6 hours as needed for nausea    . Valacyclovir HCl 1 GM OR TABS Take 1 tablet by mouth daily for supression with outbreaks . Take one tablet twice day daily    . Zolpidem Tartrate (AMBIEN) 10 MG OR TABS take one tablet at bedtime as needed        History   Substance Use Topics   . Tobacco Use: Never   . Alcohol Use: No       O/e  Gen alert    BP 150/90  Pulse 68  Temp(Src) 97.8 F (36.6 C) (Oral)  Resp 16  Wt 542 lb (245.85 kg)  LMP IUD  Obese  RS-lungs clear to  auscultation,no ralesrhonchi or wheezes  CVS- RRR no murmurs or rubs  Skin candidiasis - R elbow area.       ASSESMENT AND PLAN  401.1 BENIGN HYPERTENSION  (primary encounter diagnosis)  Comment: not well controlled.   Plan: ZEBRA LABELS, ZEBRA LABELS, ATENOLOL 50 MG OR         TABS        Increase atenolol.    477.9 ALLERGIC RHINITIS NOS  Comment: start otc medication for allergies  Plan: ZEBRA LABELS, ZEBRA LABELS, CETIRIZINE HCL 10         MG OR TABS            112.89 CANDIDIASIS SITE NEC  Comment: use topical cream. May need oral medication     Ward Givens, MD

## 2009-07-04 NOTE — Progress Notes (Signed)
Patient here for follow up on rash.  Now has rash on left leg.  Concerns about allergies and ambien.      Does patient need refills? no  Does patient need a referral? no  Does patient need a letter or need forms filled out? no  Lab results? no  Please check health mainenance issues and flag for the doctor: no

## 2009-07-07 HISTORY — PX: SURGICAL HX OTHER: 99

## 2009-07-08 ENCOUNTER — Other Ambulatory Visit: Payer: Self-pay

## 2009-07-08 DIAGNOSIS — F339 Major depressive disorder, recurrent, unspecified: Secondary | ICD-10-CM

## 2009-07-09 ENCOUNTER — Encounter (INDEPENDENT_AMBULATORY_CARE_PROVIDER_SITE_OTHER): Payer: Self-pay | Admitting: Wound Care

## 2009-07-09 DIAGNOSIS — B3789 Other sites of candidiasis: Secondary | ICD-10-CM

## 2009-07-10 NOTE — Telephone Encounter (Signed)
From: Pershing Proud   Sent: Sun Jul 08, 2009 8:24 PM   Subject: Medication Renewal Request    Original authorizing provider: Pcp Outside, MD    Pershing Proud would like a refill of the following medications:  Eletriptan Hydrobromide (RELPAX) 40 MG OR TABS [Pcp Outside, MD]  Zolpidem Tartrate (AMBIEN) 10 MG OR TABS [Pcp Outside, MD]    Preferred pharmacy: [facility]    Comment:      Medication renewals requested in this message routed to other providers:

## 2009-07-10 NOTE — Telephone Encounter (Signed)
Medication Refill Documentation    Name of Medication: Relpax  Name of Medication: Ambien    Prescribing provider:  Outside provider, historical medication.    Relpax: Date script was written: not dispensed from pharmacy patient is requesting for refills  Ambien: Date script was written:not dispensed from pharmacy patient is requesting for refills    Date last seen for this issue:  Not in filter, historical medication  Date of last OV: 07/04/09  Next scheduled appointment date: Visit date not found    email sent to patient re: history of where she got these medications from. We will need to call the pharmacy to gather last script information: quantity with refill amounts and what provider last prescribed the medications.

## 2009-07-12 MED ORDER — ZOLPIDEM TARTRATE 10 MG OR TABS
ORAL_TABLET | ORAL | Status: DC
Start: 2009-07-10 — End: 2009-10-17

## 2009-07-12 MED ORDER — ELETRIPTAN HYDROBROMIDE 40 MG OR TABS
ORAL_TABLET | ORAL | Status: DC
Start: 2009-07-10 — End: 2010-11-04

## 2009-07-12 NOTE — Telephone Encounter (Signed)
Spoke with [facility]-  Relpax last issued 03/07/09 for 10, 40 mg tabs  Ambien last issued 01/04/2009 for 30, 10 mg tabs

## 2009-07-12 NOTE — Telephone Encounter (Signed)
Given these are new, will defer to PCP.  Thanks.

## 2009-07-16 ENCOUNTER — Emergency Department
Admission: EM | Admit: 2009-07-16 | Discharge: 2009-07-17 | Disposition: A | Discharge status: Home / Self Care | Payer: Medicaid Other | Attending: Emergency Medicine | Admitting: Emergency Medicine

## 2009-07-16 DIAGNOSIS — R109 Unspecified abdominal pain: Secondary | ICD-10-CM | POA: Insufficient documentation

## 2009-07-16 DIAGNOSIS — R1012 Left upper quadrant pain: Secondary | ICD-10-CM | POA: Insufficient documentation

## 2009-07-17 ENCOUNTER — Other Ambulatory Visit (HOSPITAL_BASED_OUTPATIENT_CLINIC_OR_DEPARTMENT_OTHER): Payer: Self-pay | Admitting: Emergency Medicine

## 2009-07-17 DIAGNOSIS — R1012 Left upper quadrant pain: Secondary | ICD-10-CM

## 2009-07-17 LAB — PR CT ABDOMEN AND PELVIS W CONT

## 2009-07-17 NOTE — Telephone Encounter (Signed)
Sent unable to reach letter.

## 2009-07-20 ENCOUNTER — Ambulatory Visit: Payer: Medicaid Other | Attending: Cardiovascular Disease | Admitting: Cardiovascular Disease

## 2009-07-20 DIAGNOSIS — R072 Precordial pain: Secondary | ICD-10-CM | POA: Insufficient documentation

## 2009-07-20 DIAGNOSIS — E669 Obesity, unspecified: Secondary | ICD-10-CM | POA: Insufficient documentation

## 2009-07-20 DIAGNOSIS — I10 Essential (primary) hypertension: Secondary | ICD-10-CM | POA: Insufficient documentation

## 2009-07-24 ENCOUNTER — Telehealth (INDEPENDENT_AMBULATORY_CARE_PROVIDER_SITE_OTHER): Payer: Self-pay | Admitting: Wound Care

## 2009-07-24 NOTE — Telephone Encounter (Signed)
CONFIRMED PHONE NUMBER: [phone]  CALLERS FIRST AND LAST NAME: Pershing Proud  FACILITY NAME: N/A TITLE: N/A  CALLERS RELATIONSHIP:Self  RETURN CALL: General message OK    SUBJECT: General Message GENERAL MESSAGE    PCP: [name], MD  Main reason for the call: Pt stated she received a letter from the clinic, dated 07/17/09, in which she is informed the clinic has "some information to share" and to give a phone number she can be reached at. Pt stated it offered no other information. Pt stated the number listed in her chart is the number she can be reached.  Call back expected: YES  Daytime call back number: [phone] After 5: same Saturday: same   Okay to leave detailed VM or msg with someone at any of these numbers: YES

## 2009-07-25 NOTE — Telephone Encounter (Signed)
I am not sure. It is not from the physician.  There is nothing I could find in the chart to report to her.. Maybe it is something to do with the referral to derm. I see that she has an appt so that is nothing to worry about.

## 2009-07-25 NOTE — Telephone Encounter (Signed)
Unsure of what letter was intended for. Please advise. Thanks

## 2009-07-25 NOTE — Telephone Encounter (Signed)
Pt is calling back because she did not receive a call from the clinic. Pt states that the letter was from [facility] clinic stating that we have been trying to contact her, and to call our clinic. She's still unsure what this is for, please call the pt back regarding this matter.

## 2009-07-25 NOTE — Telephone Encounter (Signed)
As noted on the letter page, it was generated from the encounter dated 06/19/09 when patient was asking about prediabetes.  Informed pt

## 2009-07-27 ENCOUNTER — Ambulatory Visit: Payer: Medicaid Other | Attending: Dermatology | Admitting: Dermatology

## 2009-07-27 DIAGNOSIS — R21 Rash and other nonspecific skin eruption: Secondary | ICD-10-CM | POA: Insufficient documentation

## 2009-07-27 DIAGNOSIS — Z1283 Encounter for screening for malignant neoplasm of skin: Secondary | ICD-10-CM | POA: Insufficient documentation

## 2009-07-31 ENCOUNTER — Ambulatory Visit: Payer: Medicaid Other | Attending: Wound Care | Admitting: Gastroenterology

## 2009-07-31 DIAGNOSIS — K3189 Other diseases of stomach and duodenum: Secondary | ICD-10-CM | POA: Insufficient documentation

## 2009-07-31 DIAGNOSIS — R1013 Epigastric pain: Secondary | ICD-10-CM | POA: Insufficient documentation

## 2009-08-01 ENCOUNTER — Ambulatory Visit (HOSPITAL_BASED_OUTPATIENT_CLINIC_OR_DEPARTMENT_OTHER): Payer: Medicaid Other | Attending: Psychiatry | Admitting: Psychiatry

## 2009-08-01 DIAGNOSIS — F339 Major depressive disorder, recurrent, unspecified: Secondary | ICD-10-CM | POA: Insufficient documentation

## 2009-08-01 DIAGNOSIS — F509 Eating disorder, unspecified: Secondary | ICD-10-CM | POA: Insufficient documentation

## 2009-08-01 DIAGNOSIS — F3289 Other specified depressive episodes: Secondary | ICD-10-CM | POA: Insufficient documentation

## 2009-08-20 ENCOUNTER — Encounter (HOSPITAL_BASED_OUTPATIENT_CLINIC_OR_DEPARTMENT_OTHER): Payer: Medicaid Other | Admitting: Psychiatry

## 2009-08-20 ENCOUNTER — Other Ambulatory Visit: Payer: Self-pay

## 2009-08-24 ENCOUNTER — Ambulatory Visit (INDEPENDENT_AMBULATORY_CARE_PROVIDER_SITE_OTHER): Payer: Medicaid Other | Admitting: Wound Care

## 2009-08-24 ENCOUNTER — Encounter (INDEPENDENT_AMBULATORY_CARE_PROVIDER_SITE_OTHER): Payer: Self-pay | Admitting: Wound Care

## 2009-08-24 DIAGNOSIS — G43919 Migraine, unspecified, intractable, without status migrainosus: Secondary | ICD-10-CM

## 2009-08-24 MED ORDER — ALBUTEROL 90 MCG/ACT IN AERS
INHALATION_SPRAY | RESPIRATORY_TRACT | Status: DC
Start: 2009-08-24 — End: 2010-09-27

## 2009-08-24 MED ORDER — PROCHLORPERAZINE MALEATE 10 MG OR TABS
ORAL_TABLET | ORAL | Status: DC
Start: 2009-08-24 — End: 2010-11-04

## 2009-08-24 NOTE — Progress Notes (Signed)
Pt is a 34 year old female  here today for migraines   Reviewed history obtained by MA and agree on 08/24/2009    Location r side of head.   Duration getting migraines for years. Off and on for 2 weeks now. Sometimes she wake up with it.  She has used the imitrex,injections, relpax etc for years. She is here because in the last 2 weeks not much is helping her   Pain level 10/10. Right now she doesn't have a headache. She is headache free 2 days in a week in the last 2 weeks. She was doing fine 2 weeks back. No new stressors. No new foods etc .  Worse with nothing , noise from kids   Better with sleep.       There is no problem list on file for this patient.       Current outpatient prescriptions   Medication Sig   . Albuterol 90 MCG/ACT IN AERS two puff as needed    . Atenolol 50 MG OR TABS Take 1 tablet by mouth daily -NEW dose   . BuPROPion HCl (WELLBUTRIN SR) 150 MG OR TB12 one tablet twice a day    . Cetirizine HCl (ZYRTEC) 10 MG OR TABS Take 1 tablet every day for allergies   . Cholecalciferol (VITAMIN D) 2000 UNIT OR CAPS one tablet daily    . Citalopram Hydrobromide (CELEXA) 20 MG OR TABS dose increase - one daily   . Eletriptan Hydrobromide (RELPAX) 40 MG OR TABS take one tablet by mouth at onset of headache    . Ketoconazole 2 % EX CREA apply to rash area three times a day   . Meloxicam (MOBIC) 15 MG OR TABS one tablet daily for pain   . Omeprazole 20 MG OR TBEC take two capsules by mouth every day    . Prochlorperazine Maleate 10 MG OR TABS take one tablet by mouth every 6 hours as needed for nausea    . Valacyclovir HCl 1 GM OR TABS Take 1 tablet by mouth daily for supression with outbreaks . Take one tablet twice day daily    . Zolpidem Tartrate (AMBIEN) 10 MG OR TABS take one tablet at bedtime as needed        History   Substance Use Topics   . Tobacco Use: Never   . Alcohol Use: No         review of systems:    constitutional Negative   cardiovascular Negative   respiratory Negative   neurological  Negative   No new eye changes      O/e  Gen alert    BP 130/78  Pulse 84  Resp 16  Wt 545 lb (247.21 kg)  LMP IUD  HEENT neg for acute infection.   Rs clear  CVS- RRR no murmurs or rubs   Neuro nonfocal     ASSESMENT AND PLAN  784.0 HEADACHE  (primary encounter diagnosis)  Comment: Referring her to neurology as her headaches have changed.   There is some concern with her weight but she could have increased pressures. I let the neurologist comment on this  Plan: REFERRAL TO NEUROLOGY            346.91 MIGRAINE NOS INTRACTABLE  Comment: Plan: REFERRAL TO NEUROLOGY        Use  prochlorperazine with Relpax if another migraine starts. Ok to add ibuprofen if needed.       She has not tolerated other medications. My  choices are limited and I wait for neurology opinion

## 2009-08-24 NOTE — Progress Notes (Signed)
Why is patient here today? Pt is here and c/o migraines for the last 2 weeks mostly on then off. Pt states has light sensitive, sounds bother her and also has nausea and vomiting. Pt states that she was seen at [facility] ER about a week as her migraine got worse.   Does patient need refills? No   Does patient need a referral? No   Does patient need a letter or need forms filled out? Yes, disabled parking   Lab results? No   Please check health mainenance issues and flag for the doctor: no

## 2009-08-29 ENCOUNTER — Encounter (HOSPITAL_BASED_OUTPATIENT_CLINIC_OR_DEPARTMENT_OTHER): Payer: Medicaid Other | Admitting: Family

## 2009-08-30 ENCOUNTER — Telehealth (INDEPENDENT_AMBULATORY_CARE_PROVIDER_SITE_OTHER): Payer: Self-pay | Admitting: Wound Care

## 2009-08-30 NOTE — Telephone Encounter (Signed)
CONFIRMED PHONE NUMBER: [phone]  CALLERS FIRST AND LAST NAME: Pershing Proud  FACILITY NAME: n/a TITLE: n/a  CALLERS RELATIONSHIP:Self  RETURN CALL: General message OK    SUBJECT: Referral Request   REASON FOR REQUEST: Other Referral ordered, Dr offered 2nd referral if 1st clinic couldn't get patient in in timely manenr    REFERRAL TO WHAT SPECIALTY? neurology  HAVE YOU DISCUSSED THIS WITH A PROVIDER? YES - when  2/18, who Dr Ennis Forts  WHAT SYMPTOMS ARE YOU EXPERIENCING? As discussed with Dr  NAME OF DOCTOR AND/OR CLINIC: [facility]  SPECIALTY CLINIC PHONE & FAX NUMBER: [facility]

## 2009-08-31 NOTE — Telephone Encounter (Signed)
Referral re-routed to [facility] neurology as requested. Left message on vm for patient

## 2009-09-05 ENCOUNTER — Telehealth (INDEPENDENT_AMBULATORY_CARE_PROVIDER_SITE_OTHER): Payer: Self-pay | Admitting: Wound Care

## 2009-09-05 ENCOUNTER — Other Ambulatory Visit (INDEPENDENT_AMBULATORY_CARE_PROVIDER_SITE_OTHER): Payer: Self-pay | Admitting: Wound Care

## 2009-09-05 MED ORDER — CITALOPRAM HYDROBROMIDE 20 MG OR TABS
ORAL_TABLET | ORAL | Status: DC
Start: 2009-09-05 — End: 2009-10-17

## 2009-09-05 NOTE — Telephone Encounter (Signed)
Fax from [facility] pharmacy requesting refill(s).  Medication Refill Documentation    Name of Medication: citalopram    Prescribing provider:  Illene Silver, MD  Protocol: Adult:  ANTIDEPRESSANTS - OK to refill for 3 months unless last chart note specifies otherwise, patient must be seen annually.  Refill for only 1 month if not seen in the last 12 months and advise pt the yearly visit is due.    Date script was written: 05/11/09, quantity: 45m, # refills: 1    Date last seen for this issue:  08/24/09  Date of last OV: 08/24/09  Next scheduled appointment date: Visit date not found      Refilled per protocol.

## 2009-09-05 NOTE — Telephone Encounter (Signed)
Vanetta Shawl from [facility] clinic called regarding patient's referral.  Referral was redirected in Feb to [facility].  Lillian was not informed.  Left msg for patient to call back.

## 2009-09-05 NOTE — Telephone Encounter (Signed)
Patient was seen 09/04/09 at [facility].  Gear @ [facility] clinic verbally informed.

## 2009-09-10 MED ORDER — ZOLPIDEM TARTRATE 10 MG OR TABS
ORAL_TABLET | ORAL | Status: DC
Start: 2009-09-10 — End: 2010-01-04

## 2009-09-10 NOTE — Telephone Encounter (Signed)
Approved  Will sign in am

## 2009-09-10 NOTE — Telephone Encounter (Addendum)
Medication Refill Documentation    Name of Medication:   Pending Prescriptions:                       Disp   Refills    Zolpidem Tartrate (AMBIEN) 10 MG OR TABS   10 Tab          Sig: take one tablet at bedtime as needed     Prescribing provider:  Illene Silver, MD  Protocol: Adult:  ANTIDEPRESSANTS - OK to refill for 3 months unless last chart note specifies otherwise, patient must be seen annually.  Refill for only 1 month if not seen in the last 12 months and advise pt the yearly visit is due.    Date script was written: 07/10/09, quantity: 10, # refills: 1    Date last seen for this issue:  06/20/09  Date of last OV: 08/24/09  Next scheduled appointment date: 07/08/2009

## 2009-09-17 ENCOUNTER — Emergency Department
Admission: EM | Admit: 2009-09-17 | Discharge: 2009-09-17 | Disposition: A | Discharge status: Home / Self Care | Payer: Medicare Other | Attending: Emergency Medicine | Admitting: Emergency Medicine

## 2009-09-17 DIAGNOSIS — Z13228 Encounter for screening for other metabolic disorders: Secondary | ICD-10-CM | POA: Insufficient documentation

## 2009-09-17 DIAGNOSIS — Z1329 Encounter for screening for other suspected endocrine disorder: Secondary | ICD-10-CM | POA: Insufficient documentation

## 2009-09-17 DIAGNOSIS — Z130 Encounter for screening for diseases of the blood and blood-forming organs and certain disorders involving the immune mechanism: Secondary | ICD-10-CM | POA: Insufficient documentation

## 2009-09-18 ENCOUNTER — Ambulatory Visit (HOSPITAL_BASED_OUTPATIENT_CLINIC_OR_DEPARTMENT_OTHER): Payer: Medicare Other | Attending: Psychiatry | Admitting: Psychiatry

## 2009-09-18 DIAGNOSIS — F509 Eating disorder, unspecified: Secondary | ICD-10-CM | POA: Insufficient documentation

## 2009-09-18 DIAGNOSIS — F339 Major depressive disorder, recurrent, unspecified: Secondary | ICD-10-CM

## 2009-09-19 ENCOUNTER — Ambulatory Visit (HOSPITAL_BASED_OUTPATIENT_CLINIC_OR_DEPARTMENT_OTHER): Payer: Medicare Other | Attending: Family | Admitting: Family

## 2009-09-19 DIAGNOSIS — G4733 Obstructive sleep apnea (adult) (pediatric): Secondary | ICD-10-CM

## 2009-09-25 ENCOUNTER — Telehealth (INDEPENDENT_AMBULATORY_CARE_PROVIDER_SITE_OTHER): Payer: Self-pay | Admitting: Wound Care

## 2009-09-25 ENCOUNTER — Encounter (HOSPITAL_BASED_OUTPATIENT_CLINIC_OR_DEPARTMENT_OTHER): Payer: Medicaid Other | Admitting: Gastroenterology

## 2009-09-25 NOTE — Telephone Encounter (Signed)
REASON FOR REQUEST:  Caregiver Assessment    MESSAGE: Caller states she faxed over a caregiver assessment form requesting information regarding the pt on 09/17/09, requesting a call from clinic to discuss  FORWARD TO: [name], MD

## 2009-09-26 NOTE — Telephone Encounter (Signed)
Received fax. Called Misty [facility] and left a message that we did receive form.     Forward to Dr. Ennis Forts, please review form. Placed in forms to complete file at your desk.

## 2009-09-26 NOTE — Telephone Encounter (Signed)
Called Sharon Stephens and she states that she faxed a paperwork to Dr. Ennis Forts on 3/14 asked her it it was the [employer] paperwork and she states that she did not.   Informed Sharon Stephens that we has npt seen paperwork and ask her to refax paper work. Sharon Stephens would like a call back to confirm paper one way or the other.

## 2009-09-26 NOTE — Telephone Encounter (Signed)
Caregiver returning call and checking on status of request. She will be at her phone for the majority of the day and all of the mornings

## 2009-09-26 NOTE — Telephone Encounter (Signed)
I have not done a skin assessment on this patient . They can get a RN to do this for them if needed. The form only requires a Chief of Staff.    MA staff - they only need her last notes from [place] faxed to them with medication history.         Thanks

## 2009-09-27 NOTE — Telephone Encounter (Signed)
I have form. Faxed last chart notes and a list of her medications.   Will follow up with Natasha Bence tomorrow.

## 2009-09-27 NOTE — Telephone Encounter (Signed)
Kristin Bruins is out of the office today. I need a fax number to send below request. I will look for the form, it may have a number on it. If I can not find the form I will call Kristin Bruins again tomorrow.

## 2009-09-28 ENCOUNTER — Ambulatory Visit (HOSPITAL_BASED_OUTPATIENT_CLINIC_OR_DEPARTMENT_OTHER): Payer: Medicare Other | Attending: Clinical | Admitting: Clinical

## 2009-09-28 ENCOUNTER — Encounter (INDEPENDENT_AMBULATORY_CARE_PROVIDER_SITE_OTHER): Payer: Self-pay | Admitting: Wound Care

## 2009-09-28 DIAGNOSIS — F331 Major depressive disorder, recurrent, moderate: Secondary | ICD-10-CM

## 2009-09-28 DIAGNOSIS — G4730 Sleep apnea, unspecified: Secondary | ICD-10-CM | POA: Insufficient documentation

## 2009-09-28 DIAGNOSIS — I10 Essential (primary) hypertension: Secondary | ICD-10-CM | POA: Insufficient documentation

## 2009-09-28 DIAGNOSIS — F509 Eating disorder, unspecified: Secondary | ICD-10-CM | POA: Insufficient documentation

## 2009-09-28 DIAGNOSIS — R109 Unspecified abdominal pain: Secondary | ICD-10-CM | POA: Insufficient documentation

## 2009-09-28 NOTE — Telephone Encounter (Signed)
CONFIRMED PHONE NUMBER:  [phone]  CALLERS FIRST AND LAST NAME:  LISA MERRY (Social worker - [facility])   FACILITY NAME: na  TITLE: na   CALLERS RELATIONSHIP:OTHER: / Social work   RETURN CALL:  No needed     SUBJECT: General Message   REASON FOR REQUEST:  A note of thanks    MESSAGE:  Social worker wanted to thank [place] [facility] for the information which was sent on patient.   FORWARD TO:  [name], MD / [facility]

## 2009-09-28 NOTE — Telephone Encounter (Signed)
Noted  

## 2009-09-28 NOTE — Telephone Encounter (Signed)
Forward to [place].

## 2009-09-28 NOTE — Telephone Encounter (Signed)
Called Sharon Stephens and left a message for her to return my call.

## 2009-10-01 NOTE — Telephone Encounter (Signed)
Please see TE 3/25.   Closing TE as this has been taken care of.

## 2009-10-03 ENCOUNTER — Other Ambulatory Visit: Payer: Self-pay

## 2009-10-04 ENCOUNTER — Emergency Department
Admission: EM | Admit: 2009-10-04 | Discharge: 2009-10-04 | Disposition: A | Discharge status: Home / Self Care | Payer: Medicare Other | Attending: Acute Care | Admitting: Acute Care

## 2009-10-04 ENCOUNTER — Encounter (HOSPITAL_BASED_OUTPATIENT_CLINIC_OR_DEPARTMENT_OTHER): Payer: Medicaid Other | Admitting: Dermatology

## 2009-10-04 ENCOUNTER — Other Ambulatory Visit (HOSPITAL_BASED_OUTPATIENT_CLINIC_OR_DEPARTMENT_OTHER): Payer: Self-pay | Admitting: Registered Nurse

## 2009-10-04 ENCOUNTER — Ambulatory Visit (HOSPITAL_BASED_OUTPATIENT_CLINIC_OR_DEPARTMENT_OTHER): Payer: Medicare Other | Attending: Clinical | Admitting: Clinical

## 2009-10-04 DIAGNOSIS — M79609 Pain in unspecified limb: Secondary | ICD-10-CM | POA: Insufficient documentation

## 2009-10-04 DIAGNOSIS — F331 Major depressive disorder, recurrent, moderate: Secondary | ICD-10-CM

## 2009-10-04 DIAGNOSIS — I10 Essential (primary) hypertension: Secondary | ICD-10-CM

## 2009-10-04 DIAGNOSIS — R109 Unspecified abdominal pain: Secondary | ICD-10-CM | POA: Insufficient documentation

## 2009-10-04 DIAGNOSIS — G4730 Sleep apnea, unspecified: Secondary | ICD-10-CM | POA: Insufficient documentation

## 2009-10-04 DIAGNOSIS — F509 Eating disorder, unspecified: Secondary | ICD-10-CM

## 2009-10-04 DIAGNOSIS — R7309 Other abnormal glucose: Secondary | ICD-10-CM | POA: Insufficient documentation

## 2009-10-04 LAB — PR X-RAY FOOT 3+ VW

## 2009-10-06 ENCOUNTER — Other Ambulatory Visit (INDEPENDENT_AMBULATORY_CARE_PROVIDER_SITE_OTHER): Payer: Self-pay | Admitting: Wound Care

## 2009-10-09 MED ORDER — MELOXICAM 15 MG OR TABS
ORAL_TABLET | ORAL | Status: DC
Start: 2009-10-09 — End: 2009-12-14

## 2009-10-09 NOTE — Telephone Encounter (Signed)
Medication Refill Documentation    Name of Medication:   Pending Prescriptions:                       Disp   Refills    Meloxicam (MOBIC) 15 MG OR TABS            16m     1        Sig: one tablet daily for pain    Prescribing provider:  Illene Silver, MD  Protocol: Adult:  Other     Date script was written: 06/20/09, quantity: 69m , # refills: 1    Date last seen for this issue:  06/20/09  Date of last OV: 08/24/09  Next scheduled appointment date: Visit date not found

## 2009-10-10 ENCOUNTER — Ambulatory Visit (HOSPITAL_BASED_OUTPATIENT_CLINIC_OR_DEPARTMENT_OTHER): Payer: Medicare Other | Attending: Clinical | Admitting: Clinical

## 2009-10-10 DIAGNOSIS — F509 Eating disorder, unspecified: Secondary | ICD-10-CM | POA: Insufficient documentation

## 2009-10-10 DIAGNOSIS — F331 Major depressive disorder, recurrent, moderate: Secondary | ICD-10-CM

## 2009-10-10 DIAGNOSIS — R7309 Other abnormal glucose: Secondary | ICD-10-CM | POA: Insufficient documentation

## 2009-10-10 DIAGNOSIS — I10 Essential (primary) hypertension: Secondary | ICD-10-CM

## 2009-10-10 DIAGNOSIS — G4730 Sleep apnea, unspecified: Secondary | ICD-10-CM | POA: Insufficient documentation

## 2009-10-10 DIAGNOSIS — R109 Unspecified abdominal pain: Secondary | ICD-10-CM | POA: Insufficient documentation

## 2009-10-17 ENCOUNTER — Ambulatory Visit (INDEPENDENT_AMBULATORY_CARE_PROVIDER_SITE_OTHER): Payer: Self-pay | Admitting: Wound Care

## 2009-10-17 ENCOUNTER — Ambulatory Visit (INDEPENDENT_AMBULATORY_CARE_PROVIDER_SITE_OTHER): Payer: Medicaid Other | Admitting: Wound Care

## 2009-10-17 ENCOUNTER — Ambulatory Visit (HOSPITAL_BASED_OUTPATIENT_CLINIC_OR_DEPARTMENT_OTHER): Payer: Medicare Other | Attending: Psychiatry | Admitting: Clinical

## 2009-10-17 ENCOUNTER — Encounter (INDEPENDENT_AMBULATORY_CARE_PROVIDER_SITE_OTHER): Payer: Self-pay | Admitting: Wound Care

## 2009-10-17 VITALS — BP 124/86 | HR 76 | Temp 98.4°F | Resp 16 | Wt >= 6400 oz

## 2009-10-17 DIAGNOSIS — I10 Essential (primary) hypertension: Secondary | ICD-10-CM

## 2009-10-17 DIAGNOSIS — G43919 Migraine, unspecified, intractable, without status migrainosus: Secondary | ICD-10-CM

## 2009-10-17 NOTE — Progress Notes (Signed)
Why is patient here today? Pt is here for a follow up. Pt would like to follow up on headaches and stomach pain. Pt also notes that she had a swollen lymph node behind here R ear that was painful. Pt states that sx got better and is no longer having pain.   Does patient need refills? Yes, blood pressure medication.   Does patient need a referral? No   Does patient need a letter or need forms filled out? No   Lab results? No   Please check health mainenance issues and flag for the doctor: no

## 2009-10-17 NOTE — Progress Notes (Signed)
Pt is a 34 year old female  here today for Follow up    Reviewed history obtained by MA and agree on 10/17/2009    Wt - doing wt watchers. She is following the pt system.  Still not exercising but trying to.  She is having a home therapist start a program next week.      She was having some swollen glnds better now.    Headaches - she is seeing a neurologist. They are managing her headaches and medication .    There is no problem list on file for this patient.       Current outpatient prescriptions   Medication Sig   . Albuterol 90 MCG/ACT IN AERS 2 puffs inhaled every 4 hours as needed for wheezing and/or cough   . Albuterol 90 MCG/ACT IN AERS two puff as needed    . BuPROPion HCl (WELLBUTRIN SR) 200 MG OR TB12 400 mg a day two tablet twice a day    . Cetirizine HCl (ZYRTEC) 10 MG OR TABS Take 1 tablet every day for allergies   . Cholecalciferol (VITAMIN D) 2000 UNIT OR CAPS one tablet daily    . Citalopram Hydrobromide 40 MG OR TABS once a day    . Eletriptan Hydrobromide (RELPAX) 40 MG OR TABS take one tablet by mouth at onset of headache    . Hydrochlorothiazide 25 MG OR TABS once a day    . Ketoconazole 2 % EX CREA apply to rash area three times a day   . Meloxicam (MOBIC) 15 MG OR TABS one tablet daily for pain   . Omeprazole 20 MG OR TBEC take two capsules by mouth every day    . Prochlorperazine Maleate 10 MG OR TABS take one tablet by mouth every 6 hours as needed for nausea/headache   . Zolpidem Tartrate (AMBIEN) 10 MG OR TABS take one tablet at bedtime as needed        History   Substance Use Topics   . Tobacco Use: Never   . Alcohol Use: No         review of systems:    constitutional Negative   cardiovascular Negative   respiratory Negative       O/e  Gen alert    BP 124/86  Pulse 76  Temp(Src) 98.4 F (36.9 C) (Oral)  Resp 16  Wt 546 lb (247.664 kg)  LMP IUD  Exam NAD      ASSESMENT AND PLAN  401.1 BENIGN HYPERTENSION  (primary encounter diagnosis)  Comment: stable      346.91 MIGRAINE NOS  INTRACTABLE  Comment: followed by neurology. Awaiting notes.    Gaining wt . Not losing. A1c around 6 last time. Going to bariatric surgery soon.  Needs to exercise - understands . Ct wt watchers.     Greater than 50% of this visit was spent face to face with the patient in counseling and/or coordination of care, and/or discussing treatment/management options, and/or education with the patient and/or family member.  Total time spent was 15 minutes.

## 2009-10-24 ENCOUNTER — Ambulatory Visit (HOSPITAL_BASED_OUTPATIENT_CLINIC_OR_DEPARTMENT_OTHER): Payer: Medicare Other | Attending: Clinical | Admitting: Clinical

## 2009-10-24 DIAGNOSIS — I10 Essential (primary) hypertension: Secondary | ICD-10-CM

## 2009-10-24 DIAGNOSIS — F331 Major depressive disorder, recurrent, moderate: Secondary | ICD-10-CM | POA: Insufficient documentation

## 2009-10-24 DIAGNOSIS — F509 Eating disorder, unspecified: Secondary | ICD-10-CM

## 2009-10-24 DIAGNOSIS — R7309 Other abnormal glucose: Secondary | ICD-10-CM | POA: Insufficient documentation

## 2009-10-24 DIAGNOSIS — G4730 Sleep apnea, unspecified: Secondary | ICD-10-CM | POA: Insufficient documentation

## 2009-10-24 DIAGNOSIS — F3289 Other specified depressive episodes: Secondary | ICD-10-CM | POA: Insufficient documentation

## 2009-10-24 DIAGNOSIS — R109 Unspecified abdominal pain: Secondary | ICD-10-CM | POA: Insufficient documentation

## 2009-10-30 ENCOUNTER — Ambulatory Visit (HOSPITAL_BASED_OUTPATIENT_CLINIC_OR_DEPARTMENT_OTHER): Payer: Medicare Other | Admitting: Clinical

## 2009-10-30 ENCOUNTER — Ambulatory Visit (HOSPITAL_BASED_OUTPATIENT_CLINIC_OR_DEPARTMENT_OTHER): Payer: Medicare Other | Attending: Clinical | Admitting: Psychiatry

## 2009-10-30 DIAGNOSIS — R7309 Other abnormal glucose: Secondary | ICD-10-CM | POA: Insufficient documentation

## 2009-10-30 DIAGNOSIS — I10 Essential (primary) hypertension: Secondary | ICD-10-CM

## 2009-10-30 DIAGNOSIS — F331 Major depressive disorder, recurrent, moderate: Secondary | ICD-10-CM

## 2009-10-30 DIAGNOSIS — F3289 Other specified depressive episodes: Secondary | ICD-10-CM | POA: Insufficient documentation

## 2009-10-30 DIAGNOSIS — R109 Unspecified abdominal pain: Secondary | ICD-10-CM | POA: Insufficient documentation

## 2009-10-30 DIAGNOSIS — F509 Eating disorder, unspecified: Secondary | ICD-10-CM | POA: Insufficient documentation

## 2009-10-30 DIAGNOSIS — G4730 Sleep apnea, unspecified: Secondary | ICD-10-CM | POA: Insufficient documentation

## 2009-10-31 ENCOUNTER — Telehealth (INDEPENDENT_AMBULATORY_CARE_PROVIDER_SITE_OTHER): Payer: Self-pay | Admitting: Wound Care

## 2009-10-31 ENCOUNTER — Encounter (INDEPENDENT_AMBULATORY_CARE_PROVIDER_SITE_OTHER): Payer: Self-pay | Admitting: Wound Care

## 2009-10-31 ENCOUNTER — Inpatient Hospital Stay (HOSPITAL_COMMUNITY): Payer: Medicare Other

## 2009-10-31 ENCOUNTER — Ambulatory Visit (INDEPENDENT_AMBULATORY_CARE_PROVIDER_SITE_OTHER): Payer: Medicare Other | Admitting: Wound Care

## 2009-10-31 ENCOUNTER — Other Ambulatory Visit (HOSPITAL_BASED_OUTPATIENT_CLINIC_OR_DEPARTMENT_OTHER): Payer: Self-pay | Admitting: Emergency Medicine

## 2009-10-31 ENCOUNTER — Inpatient Hospital Stay
Admission: EM | Admit: 2009-10-31 | Discharge: 2009-11-05 | DRG: 075 | Disposition: A | Discharge status: Home / Self Care | Payer: Medicare Other | Attending: Neurology | Admitting: Neurology

## 2009-10-31 VITALS — BP 160/100 | HR 88 | Temp 99.2°F | Resp 16 | Wt >= 6400 oz

## 2009-10-31 DIAGNOSIS — J069 Acute upper respiratory infection, unspecified: Secondary | ICD-10-CM

## 2009-10-31 DIAGNOSIS — I10 Essential (primary) hypertension: Secondary | ICD-10-CM | POA: Diagnosis present

## 2009-10-31 DIAGNOSIS — R55 Syncope and collapse: Secondary | ICD-10-CM

## 2009-10-31 DIAGNOSIS — S0993XA Unspecified injury of face, initial encounter: Secondary | ICD-10-CM

## 2009-10-31 DIAGNOSIS — F3289 Other specified depressive episodes: Secondary | ICD-10-CM | POA: Diagnosis present

## 2009-10-31 DIAGNOSIS — A879 Viral meningitis, unspecified: Principal | ICD-10-CM | POA: Diagnosis present

## 2009-10-31 DIAGNOSIS — G4733 Obstructive sleep apnea (adult) (pediatric): Secondary | ICD-10-CM | POA: Diagnosis present

## 2009-10-31 DIAGNOSIS — Z881 Allergy status to other antibiotic agents status: Secondary | ICD-10-CM

## 2009-10-31 DIAGNOSIS — Z880 Allergy status to penicillin: Secondary | ICD-10-CM

## 2009-10-31 DIAGNOSIS — M654 Radial styloid tenosynovitis [de Quervain]: Secondary | ICD-10-CM

## 2009-10-31 DIAGNOSIS — Z6841 Body Mass Index (BMI) 40.0 and over, adult: Secondary | ICD-10-CM

## 2009-10-31 DIAGNOSIS — R29810 Facial weakness: Secondary | ICD-10-CM | POA: Diagnosis present

## 2009-10-31 DIAGNOSIS — M4000 Postural kyphosis, site unspecified: Secondary | ICD-10-CM | POA: Diagnosis present

## 2009-10-31 NOTE — Progress Notes (Signed)
Why is patient here today? 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 Pt states that she is having a flare up right now. Pt states that she is having a hard time moving thumb. Pt is also concerned that she might have a sinus infection.   Does patient need refills? No   Does patient need a referral? Yes, hand   Does patient need a letter or need forms filled out? No   Lab results? No   Please check health mainenance issues and flag for the doctor: no

## 2009-10-31 NOTE — Telephone Encounter (Signed)
Pt was seen today and per PCP pt had cold sx.    PCP also reported re-check of BP was normal (initially was 160/100).      Left message that we would try once more today or tomororow and that if her sx are worsening, she should call after hours nurse or seek ER?

## 2009-10-31 NOTE — Progress Notes (Signed)
Pt is a 34 year old female  here today for left arm pain   Reviewed history obtained by MA and agree on 10/31/2009    Started a long time back. Gets aggravated. Has had shots in wrist before.  This time started 3 weeks back. No trauma. Has an issue with her bones in the past and broke her wrist 2002. No redness or swelling.    Also has a URI. Cough congestion sinus pressure for 1 weeks or so.  Coughing a lot. Seems to be getting better.     BP high per MA. Rechecked. Pt lost her wallet and is stressed.       Patient Active Problem List   Diagnoses Code   . BENIGN HYPERTENSION 401.1   . MIGRAINE NOS INTRACTABLE 346.91          Current outpatient prescriptions   Medication Sig   . Albuterol 90 MCG/ACT IN AERS 2 puffs inhaled every 4 hours as needed for wheezing and/or cough   . Albuterol 90 MCG/ACT IN AERS two puff as needed    . BuPROPion HCl (WELLBUTRIN SR) 200 MG OR TB12 400 mg a day two tablet twice a day    . Cetirizine HCl (ZYRTEC) 10 MG OR TABS Take 1 tablet every day for allergies   . Cholecalciferol (VITAMIN D) 2000 UNIT OR CAPS one tablet daily    . Citalopram Hydrobromide 40 MG OR TABS once a day    . Eletriptan Hydrobromide (RELPAX) 40 MG OR TABS take one tablet by mouth at onset of headache    . Hydrochlorothiazide 25 MG OR TABS once a day    . Ketoconazole 2 % EX CREA apply to rash area three times a day   . Meloxicam (MOBIC) 15 MG OR TABS one tablet daily for pain   . Omeprazole 20 MG OR TBEC take two capsules by mouth every day    . Prochlorperazine Maleate 10 MG OR TABS take one tablet by mouth every 6 hours as needed for nausea/headache   . Zolpidem Tartrate (AMBIEN) 10 MG OR TABS take one tablet at bedtime as needed        History   Substance Use Topics   . Tobacco Use: Never   . Alcohol Use: No         review of systems:    constitutional Negative   cardiovascular Negative   respiratory Negative       O/e  Gen alert    BP 160/100  Pulse 88  Temp(Src) 99.2 F (37.3 C) (Oral)  Resp 16  Wt 545 lb  (247.21 kg)  LMP IUD  Recheck left around 132/ 90 and R systolic 120/80 , very diff to get BP.    Left wrist - no swelling or erythema . Tenderness with forced extension of thumb.   Radial pulse. Normal.    HEAD - atraumatic   EAR- external exam -normal,external canals- clear , TMS - clear  EYE- conjuctiva clear, sclera anicteric,PERRL,EOMI  NOSE- Clear clear drainage   THROAT - Clear with no erythema , oral hygiene good  Mastoid area - No mastoid tenderness  Sinus - no  maxillary tenderness and no  frontal sinus tenderness   Neck no lymphadenopathy       ASSESMENT AND PLAN    727.04 RADIAL STYLOID TENOSYNOV  (primary encounter diagnosis)  Comment: Plan: [facility]        Refer to hand for injection    465.9  ACUTE URI NOS  Comment: Plan: Symptomatic therapy suggested: rest and increase fluids.

## 2009-11-01 ENCOUNTER — Emergency Department (HOSPITAL_BASED_OUTPATIENT_CLINIC_OR_DEPARTMENT_OTHER): Payer: Medicare Other

## 2009-11-01 ENCOUNTER — Other Ambulatory Visit (HOSPITAL_BASED_OUTPATIENT_CLINIC_OR_DEPARTMENT_OTHER): Payer: Self-pay | Admitting: Emergency Medicine

## 2009-11-01 DIAGNOSIS — R55 Syncope and collapse: Secondary | ICD-10-CM

## 2009-11-01 LAB — PR CT CERVICAL SPINE W/O CONTRAST MATERIAL

## 2009-11-01 LAB — PR CT HEAD/BRAIN W/O CONTRAST MATERIAL

## 2009-11-01 LAB — XR FLUORO GUIDED LUMBAR SPINAL PUNCTURE

## 2009-11-01 NOTE — Telephone Encounter (Signed)
Patient went to ER last night and is being worked up --  Negative CT scan --is going to have LP this am.  Labs OK --slept  Most of the night.  If negative --will be discharged.

## 2009-11-02 DIAGNOSIS — F3289 Other specified depressive episodes: Secondary | ICD-10-CM

## 2009-11-02 NOTE — Telephone Encounter (Signed)
Patient is still in hospital.  Forward to Monday for f/u

## 2009-11-03 DIAGNOSIS — R110 Nausea: Secondary | ICD-10-CM

## 2009-11-04 DIAGNOSIS — G971 Other reaction to spinal and lumbar puncture: Secondary | ICD-10-CM

## 2009-11-05 DIAGNOSIS — G43909 Migraine, unspecified, not intractable, without status migrainosus: Secondary | ICD-10-CM

## 2009-11-05 NOTE — Telephone Encounter (Signed)
Patient is still inpatient.  Has developed what they feel is post spinal headache.

## 2009-11-06 ENCOUNTER — Inpatient Hospital Stay (HOSPITAL_COMMUNITY): Payer: Medicare Other | Admitting: Neurology

## 2009-11-06 ENCOUNTER — Inpatient Hospital Stay
Admission: EM | Admit: 2009-11-06 | Discharge: 2009-11-08 | DRG: 103 | Disposition: A | Discharge status: Home / Self Care | Payer: Medicare Other | Attending: Neurology | Admitting: Neurology

## 2009-11-06 DIAGNOSIS — G43909 Migraine, unspecified, not intractable, without status migrainosus: Secondary | ICD-10-CM | POA: Diagnosis present

## 2009-11-06 DIAGNOSIS — F3289 Other specified depressive episodes: Secondary | ICD-10-CM | POA: Diagnosis present

## 2009-11-06 DIAGNOSIS — I10 Essential (primary) hypertension: Secondary | ICD-10-CM | POA: Diagnosis present

## 2009-11-06 DIAGNOSIS — R110 Nausea: Secondary | ICD-10-CM

## 2009-11-06 DIAGNOSIS — G4733 Obstructive sleep apnea (adult) (pediatric): Secondary | ICD-10-CM | POA: Diagnosis present

## 2009-11-06 DIAGNOSIS — G971 Other reaction to spinal and lumbar puncture: Principal | ICD-10-CM | POA: Diagnosis present

## 2009-11-06 DIAGNOSIS — Z6841 Body Mass Index (BMI) 40.0 and over, adult: Secondary | ICD-10-CM

## 2009-11-06 DIAGNOSIS — A879 Viral meningitis, unspecified: Secondary | ICD-10-CM | POA: Diagnosis present

## 2009-11-06 NOTE — Telephone Encounter (Signed)
Patient was discharged last pm and presented at ER this am with headache. Was readmitted.

## 2009-11-07 ENCOUNTER — Encounter (INDEPENDENT_AMBULATORY_CARE_PROVIDER_SITE_OTHER): Payer: Self-pay | Admitting: Wound Care

## 2009-11-07 ENCOUNTER — Encounter (HOSPITAL_BASED_OUTPATIENT_CLINIC_OR_DEPARTMENT_OTHER): Payer: Medicare Other | Admitting: Clinical

## 2009-11-08 ENCOUNTER — Encounter (HOSPITAL_BASED_OUTPATIENT_CLINIC_OR_DEPARTMENT_OTHER): Payer: Medicare Other

## 2009-11-08 ENCOUNTER — Telehealth (INDEPENDENT_AMBULATORY_CARE_PROVIDER_SITE_OTHER): Payer: Self-pay | Admitting: Wound Care

## 2009-11-08 NOTE — Telephone Encounter (Addendum)
Patient still inpatient.  Considering a blood patch.

## 2009-11-08 NOTE — Telephone Encounter (Signed)
[facility] called regarding pt resolved problem and wanted to inform PCP that patient is doing better. If any follow questions pager number is attached above. Thanks

## 2009-11-08 NOTE — Telephone Encounter (Signed)
Noted. FYI Dr. Patni

## 2009-11-09 NOTE — Telephone Encounter (Signed)
Patient discharged 5/5,  Planned on attending a bariatric surgery information meeting 5/5/11PM.

## 2009-11-13 ENCOUNTER — Other Ambulatory Visit: Payer: Self-pay

## 2009-11-14 ENCOUNTER — Ambulatory Visit (HOSPITAL_BASED_OUTPATIENT_CLINIC_OR_DEPARTMENT_OTHER): Payer: Medicare Other | Attending: Clinical | Admitting: Clinical

## 2009-11-14 DIAGNOSIS — R7309 Other abnormal glucose: Secondary | ICD-10-CM | POA: Insufficient documentation

## 2009-11-14 DIAGNOSIS — G4730 Sleep apnea, unspecified: Secondary | ICD-10-CM | POA: Insufficient documentation

## 2009-11-14 DIAGNOSIS — I10 Essential (primary) hypertension: Secondary | ICD-10-CM

## 2009-11-14 DIAGNOSIS — R109 Unspecified abdominal pain: Secondary | ICD-10-CM | POA: Insufficient documentation

## 2009-11-14 DIAGNOSIS — F331 Major depressive disorder, recurrent, moderate: Secondary | ICD-10-CM

## 2009-11-14 DIAGNOSIS — F509 Eating disorder, unspecified: Secondary | ICD-10-CM

## 2009-11-17 ENCOUNTER — Encounter (INDEPENDENT_AMBULATORY_CARE_PROVIDER_SITE_OTHER): Payer: Self-pay | Admitting: Wound Care

## 2009-11-17 ENCOUNTER — Telehealth (INDEPENDENT_AMBULATORY_CARE_PROVIDER_SITE_OTHER): Payer: Self-pay | Admitting: Wound Care

## 2009-11-17 ENCOUNTER — Ambulatory Visit (INDEPENDENT_AMBULATORY_CARE_PROVIDER_SITE_OTHER): Payer: Medicare Other | Admitting: Wound Care

## 2009-11-17 VITALS — BP 140/100 | HR 84 | Resp 16 | Wt >= 6400 oz

## 2009-11-17 DIAGNOSIS — I10 Essential (primary) hypertension: Secondary | ICD-10-CM

## 2009-11-17 NOTE — Telephone Encounter (Signed)
Called [facility] and pt is taking Propanolol 60 mg QD. Rx'd by Lennox Grumbles.   Updated med list.

## 2009-11-17 NOTE — Progress Notes (Signed)
Pt is a 34 year old female  here today for headache.   Reviewed history obtained by MA and agree on 11/17/2009    Pt is here for headache Follow up . She feels great today.     Pt states that the hosp stopped her hctz. She was vomiting at the hosp and had electrolyte issues.  Her BP is up today. Not using any other medication for htn. Her neurologist stopped the atenolol also.  She was on propanolol for headaches but not usedit for a while now secondary to refill issues with the neurologist.     revd [place] chart.     Patient Active Problem List   Diagnoses Code   . BENIGN HYPERTENSION 401.1   . MIGRAINE NOS INTRACTABLE 346.91          Current outpatient prescriptions   Medication Sig   . Albuterol 90 MCG/ACT IN AERS 2 puffs inhaled every 4 hours as needed for wheezing and/or cough   . Albuterol 90 MCG/ACT IN AERS two puff as needed    . BuPROPion HCl (WELLBUTRIN SR) 200 MG OR TB12 400 mg a day two tablet twice a day    . Cetirizine HCl (ZYRTEC) 10 MG OR TABS Take 1 tablet every day for allergies   . Cholecalciferol (VITAMIN D) 2000 UNIT OR CAPS one tablet daily    . Citalopram Hydrobromide 40 MG OR TABS once a day    . Eletriptan Hydrobromide (RELPAX) 40 MG OR TABS take one tablet by mouth at onset of headache    . Hydrochlorothiazide 25 MG OR TABS once a day    . Ketoconazole 2 % EX CREA apply to rash area three times a day   . Meloxicam (MOBIC) 15 MG OR TABS one tablet daily for pain   . Omeprazole 20 MG OR TBEC take two capsules by mouth every day    . Prochlorperazine Maleate 10 MG OR TABS take one tablet by mouth every 6 hours as needed for nausea/headache   . Zolpidem Tartrate (AMBIEN) 10 MG OR TABS take one tablet at bedtime as needed        History   Substance Use Topics   . Tobacco Use: Never   . Alcohol Use: No         review of systems:    cardiovascular Negative   respiratory Negative       O/e  Gen alert    BP 140/100  Pulse 84  Resp 16  Wt 532 lb (241.314 kg)  LMP IUD  BP is running higher.   resp  clear   [facility]      ASSESMENT AND PLAN  401.1 BENIGN HYPERTENSION  (primary encounter diagnosis)  Comment: restart propanolol. This will help with headache and her BP. She cannot just stop it. See TE.  Confirmed with pharmacy that pt has med from neurologist. Recheck BP in 2 weeks. May need to add ACE. She understands.

## 2009-11-17 NOTE — Telephone Encounter (Signed)
Staff can we find out how much propanolol the pt is taking.   pleasecall the pharmacy [facility] and see her last rx. Thanks

## 2009-11-17 NOTE — Progress Notes (Signed)
Why is patient here today? Pt is here for a hospital follow up.     Any new significant medical diagnoses since last visit? YES: blood patch     Refills? NO    Referral? NO    Letter or form? NO    Lab results? NO    Health maintenance issues? NO

## 2009-11-17 NOTE — Telephone Encounter (Signed)
Yes update med list with info from pharmacy

## 2009-11-17 NOTE — Telephone Encounter (Signed)
Pt was seen in clinic today she said her bp med already got call in by a different doctor. Thank you

## 2009-11-17 NOTE — Telephone Encounter (Signed)
Forward to Dr. Ennis Forts, please advise if you would like to to still follow up on this.

## 2009-11-19 ENCOUNTER — Encounter (INDEPENDENT_AMBULATORY_CARE_PROVIDER_SITE_OTHER): Payer: Medicare Other | Admitting: Hand Surgery

## 2009-11-20 ENCOUNTER — Other Ambulatory Visit: Payer: Self-pay

## 2009-11-21 ENCOUNTER — Ambulatory Visit (HOSPITAL_BASED_OUTPATIENT_CLINIC_OR_DEPARTMENT_OTHER): Payer: Medicare Other | Attending: Clinical | Admitting: Clinical

## 2009-11-21 DIAGNOSIS — F509 Eating disorder, unspecified: Secondary | ICD-10-CM

## 2009-11-21 DIAGNOSIS — F331 Major depressive disorder, recurrent, moderate: Secondary | ICD-10-CM | POA: Insufficient documentation

## 2009-11-21 DIAGNOSIS — R109 Unspecified abdominal pain: Secondary | ICD-10-CM | POA: Insufficient documentation

## 2009-11-21 DIAGNOSIS — G4730 Sleep apnea, unspecified: Secondary | ICD-10-CM | POA: Insufficient documentation

## 2009-11-21 DIAGNOSIS — I10 Essential (primary) hypertension: Secondary | ICD-10-CM

## 2009-11-21 DIAGNOSIS — R7309 Other abnormal glucose: Secondary | ICD-10-CM | POA: Insufficient documentation

## 2009-11-23 ENCOUNTER — Ambulatory Visit (INDEPENDENT_AMBULATORY_CARE_PROVIDER_SITE_OTHER): Payer: Self-pay | Admitting: Wound Care

## 2009-11-23 ENCOUNTER — Encounter (HOSPITAL_BASED_OUTPATIENT_CLINIC_OR_DEPARTMENT_OTHER): Payer: Medicaid Other | Admitting: Cardiovascular Disease

## 2009-11-26 ENCOUNTER — Ambulatory Visit: Payer: Medicare Other | Attending: Hand Surgery | Admitting: Hand Surgery

## 2009-11-26 ENCOUNTER — Encounter (INDEPENDENT_AMBULATORY_CARE_PROVIDER_SITE_OTHER): Payer: Medicare Other | Admitting: Rehabilitative and Restorative Service Providers"

## 2009-11-26 ENCOUNTER — Ambulatory Visit: Payer: Medicare Other | Admitting: Rehabilitative and Restorative Service Providers"

## 2009-11-26 VITALS — BP 140/90 | HR 81

## 2009-11-26 DIAGNOSIS — IMO0001 Reserved for inherently not codable concepts without codable children: Secondary | ICD-10-CM | POA: Insufficient documentation

## 2009-11-26 DIAGNOSIS — Q740 Other congenital malformations of upper limb(s), including shoulder girdle: Secondary | ICD-10-CM | POA: Insufficient documentation

## 2009-11-26 DIAGNOSIS — M25539 Pain in unspecified wrist: Secondary | ICD-10-CM

## 2009-11-26 LAB — PR X-RAY WRIST 3+ VW

## 2009-11-26 NOTE — Progress Notes (Signed)
Occupational Therapy Hand Clinic Note: [facility]    This note serves as a Leisure centre manager form that requires the referring provider to certify the need for therapy services furnished under this Plan of Treatment. The signing provider is certifying the plan of care.    Start of care date: 11/26/2009    Certification from: 11/26/2009 through February 25, 2010    Date of onset: 11/26/2009. Date of referral is being used for onset date, due to exacerbation of symptoms.    The following patient identifiers were confirmed with the patient: name and date of birth    Time In: 2:45 PM   Time Out: 3:15 PM   Duration of Treatment: 30 minutes    Referring M.D:  Cristela Felt, M.D.    History: Patient is a right hand dominant female, currently on disability, who reports a several week history of left wrist pain.  Patient has past medical history of bilateral Madelung's deformity, and notes that she does have pain flare-ups, left greater than right, every few years.   Patient is morbidly obese, and states that she lives with room-mates and her 2 children, ages 37 and 77.    Fall risk: a low risk for falls.    [name] Butterbaugh arrived on this date from the [facility] with orders for therapy to provide: Orthosis fabrication    Barriers to learning: none  Preferred learning style: demonstration, written and verbal  Cultural Practices Influencing care: none    Prior level of function: patient was able to lift objects with her left hand without pain. She was able to brush her daughter's hair, dress herself without pain.    Level of function at start of care: Patient reports increased pain mainly when she tries to lift heavy objects.  She has pain with brushing her daughter's hair, opening jar lids, and dressing herself.    Skilled Services/Interventions provided: orthosis  for 30 minutes WHO wrist gauntlet [id number].  The entire treatment session was spent on fabrication of a thermoplastic forearm-based left  wrist orthosis.  Patient was instructed in wear and care of orthosis.    Current Impairments:  Range of Motion: not tested.    Strength:  Not tested    Pain: The patient is reporting 6-7 level of pain, on a 0-10 Numeric Pain Distress Scale following her steroid injection.  She states that her pain has been a 4/10 the past few weeks.    Sensation: normal    Patient's participation in home program:   Patient verbalizes/demonstrates comprehension of the exercise routine, progression and precautions.  Patient verbalizes comprehension of the use of the splint, and was able to don and doff splint independently and correctly.    GOALS:    Long term goal(s): (Hand)  Open a jar lid with involved hand within 6 weeks. and brush daughter's hair within 6 weeks without wrist pain.    These goals were discussed and the patient agrees with them.  Rehab potential: Good    PLAN:   Frequency/Duration: This patient was seen for a one time session for orthosis fabrication.  The therapy will include: Orthotic management       Patient may also be seen by an Wellsite geologist. The plan of care has been reviewed with the [place] Patent examiner).

## 2009-11-26 NOTE — Progress Notes (Deleted)
No chief complaint on file.    Dear Dr. Ennis Forts, [place],    I had the pleasure of seeing your patient, Sharon Stephens at [facility] [id number]} today on 11/26/2009 in consultation for evaluation and treatment of ***.    As you know,  Sharon Stephens is a 34 year old {RHD:106038} female who {WORK NFAO:130865}.  She presents with a chief complaint of {CHIEF COMPLAINT HQIO:962952} in the {RIGHT/LEFT:104821} {LOCATION UPPER EXTREMITY:105891} that has been ongoing for {NUMBERS:100716} {TIME UNITS III:100419} {TRAUMA:106037} ***    She currently rates her pain as {PAIN SCALE:104448::"0 (No Pain)"} out of 10 that is {PAIN QUALITY:104449}. Pain is aggravated by ***. The pain has come on {ONSET:104370}.  Sensation is {SENSATION:100506::"normal"} in the ***.  She notes {STRENGTH WUXL:244010} . She currently rates her strength as {NUMBERS:100716} out of 10.     Past Medical History:  Past Medical History   Diagnosis Date   . DEPRESSIVE DISORDER NEC    . CHRON OBST ASTHMA WO STATUS ASTHM    . OTHER UNSPEC SLEEP APNEA    . DIABETES UNCOMPL ADULT-TYPE II      broaderline    . HERNIA NEC      notes having 3 in abdomen          Medications:     Current outpatient prescriptions   Medication Sig   . Albuterol 90 MCG/ACT IN AERS 2 puffs inhaled every 4 hours as needed for wheezing and/or cough   . Albuterol 90 MCG/ACT IN AERS two puff as needed    . BuPROPion HCl (WELLBUTRIN SR) 200 MG OR TB12 400 mg a day two tablet twice a day    . Cetirizine HCl (ZYRTEC) 10 MG OR TABS Take 1 tablet every day for allergies   . Cholecalciferol (VITAMIN D) 2000 UNIT OR CAPS one tablet daily    . Citalopram Hydrobromide 40 MG OR TABS once a day    . Eletriptan Hydrobromide (RELPAX) 40 MG OR TABS take one tablet by mouth at onset of headache    . Ketoconazole 2 % EX CREA apply to rash area three times a day   . Meloxicam (MOBIC) 15 MG OR TABS one tablet daily for pain   . Omeprazole 20 MG OR TBEC take two capsules by mouth every day    .  Prochlorperazine Maleate 10 MG OR TABS take one tablet by mouth every 6 hours as needed for nausea/headache   . Propranolol HCl 60 MG Oral Tab Once a day    . Zolpidem Tartrate (AMBIEN) 10 MG OR TABS take one tablet at bedtime as needed          Allergies:   Review of patient's allergies indicates:  Allergies   Allergen Reactions   . Amoxicillin      Unsure from childhood    . Penicillins      Unsure from childhood          Past Surgical History:  Past Surgical History   Procedure Date   . C-sections    . Gallbladder removed    . Appendectomy    . Tube tied    . Removal of tonsils          Family History:  Family History   Problem Relation   . Alcohol/Drug No Hx Of   . Diabetes Mother   . Diabetes Maternal Grandmother   . Diabetes Maternal Grandfather   . Diabetes Paternal Grandmother   . Heart disease Maternal Grandfather   .  Heart disease Paternal Grandmother   . Hypertension Maternal Grandmother   . Hypertension Maternal Grandfather   . Hypertension Paternal Grandmother   . Hypertension Paternal Grandfather   . Lipids Maternal Grandfather   . Stroke Maternal Grandfather         Social History:   The patient states her problem {is/ is not:103979::"is "} work related.  She {MARITALSTATUS2:106092} and has {NUMBERS 1-5 ([place]/ORTHO):105718} children.     History   Alcohol Use No       History   Tobacco Use Never         ROS:   Positive for {ROSHAND:105976}. All other systems of the 14 reviewed are negative.     Hand & Upper Extremity Examination    Physical Examination  Gen: Patient is {GENERAL APPEARANCE:50::"healthy","alert","no distress"}    Psych: {PSYCH EXAM:106179::"Alert and oriented times 3"}  {MOOD AND AFFECT:106180}    Skin: {(HAND) SKIN JXBJ:478295}  {SKIN MASS AOZHY:865784}    Vascular: {VASCULAR:106181}    Neurologic: {NEUROHAND:105959}    Musculoskeletal:  {MSK HUANG GENERAL:106183}  {ROM ON:629528}  {ROM UXLK:440102}  {MSK HUANG STABILITY:106184}  {MSK HUANG STRENGTH:106185}      Studies:  {STUDIES:106062::"Not applicable"}      Assessment:   ***    Plan:  ***    Thank you for allowing [place] to participate in the care of your patient.    Sincerely,    Azucena Fallen, MD  [facility]  Department of Orthopaedics and Sports Medicine  Hand, Wrist, and Elbow Surgery

## 2009-11-27 ENCOUNTER — Encounter (INDEPENDENT_AMBULATORY_CARE_PROVIDER_SITE_OTHER): Payer: Self-pay | Admitting: Hand Surgery

## 2009-11-27 ENCOUNTER — Ambulatory Visit (HOSPITAL_BASED_OUTPATIENT_CLINIC_OR_DEPARTMENT_OTHER): Payer: Medicare Other | Attending: Clinical | Admitting: Clinical

## 2009-11-27 DIAGNOSIS — F509 Eating disorder, unspecified: Secondary | ICD-10-CM

## 2009-11-27 DIAGNOSIS — G4730 Sleep apnea, unspecified: Secondary | ICD-10-CM

## 2009-11-27 DIAGNOSIS — I10 Essential (primary) hypertension: Secondary | ICD-10-CM | POA: Insufficient documentation

## 2009-11-27 DIAGNOSIS — F3289 Other specified depressive episodes: Secondary | ICD-10-CM | POA: Insufficient documentation

## 2009-11-27 DIAGNOSIS — F331 Major depressive disorder, recurrent, moderate: Secondary | ICD-10-CM

## 2009-11-27 DIAGNOSIS — R7309 Other abnormal glucose: Secondary | ICD-10-CM | POA: Insufficient documentation

## 2009-11-27 DIAGNOSIS — R109 Unspecified abdominal pain: Secondary | ICD-10-CM | POA: Insufficient documentation

## 2009-11-27 NOTE — Progress Notes (Signed)
Chief Complaint   Patient presents with   . Musculoskeletal Problem     Left wrist pain             Dear Dr. Ennis Forts, Darrick Penna,    I had the pleasure of seeing your patient, Sharon Stephens at [facility] today on 11/26/2009 in consultation for evaluation and treatment of left wrist pain.    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  She presents with a chief complaint of pain in the left wrist that has been ongoing for 10 years. Patient sustained an injury to the left forearm as a child. When she was 30 years old she sustained a both bone forearm fracture.  She was treated closed and she healed without complication. About 10 years ago, she began having bilateral wrist pain.  At today's visit she only has left wrist pain.  She denies any recent injuries.  She recently moved to [place] from [place] 8 months ago.  She notes she did see an orthopedic surgeon in [place] previously who told her she had a wrist deformity and only surgery could correct her pain. She has had previous steroid injections into bilateral wrists.  She notes she has had 2 previous injections into both wrists, but it has been well over a year since her last one.  She notes she has occasional numbness in bilateral hands, but it is not present today.      She currently rates her pain as 6 out of 10 that is Aching, Stabbing, Burning and Heavy. Pain is aggravated by activity. The pain has come on gradually.  Sensation is normal in the bilateral hands.  She notes a decrease in strength . She currently rates her strength as 5 out of 10.     Past Medical History:  Past Medical History   Diagnosis Date   . DEPRESSIVE DISORDER NEC    . CHRON OBST ASTHMA WO STATUS ASTHM    . OTHER UNSPEC SLEEP APNEA    . DIABETES UNCOMPL ADULT-TYPE II      broaderline    . HERNIA NEC      notes having 3 in abdomen    . ALLERGIC RHINITIS NOS    . ESOPHAGEAL REFLUX    . BENIGN  HYPERTENSION            Medications:     Medications marked Taking as of 11/26/09 encounter (Office Visit) with [facility]   Medication Sig Dispense Refill   . Albuterol 90 MCG/ACT IN AERS 2 puffs inhaled every 4 hours as needed for wheezing and/or cough  1 Inhaler  2   . Albuterol 90 MCG/ACT IN AERS two puff as needed        . BuPROPion HCl (WELLBUTRIN SR) 200 MG OR TB12 400 mg a day two tablet twice a day        . Cetirizine HCl (ZYRTEC) 10 MG OR TABS Take 1 tablet every day for allergies  90 Tab  3   . Cholecalciferol (VITAMIN D) 2000 UNIT OR CAPS one tablet daily   8m  11   . Citalopram Hydrobromide 40 MG OR TABS once a day        . Eletriptan Hydrobromide (RELPAX) 40 MG OR TABS take one tablet by mouth at onset of headache   9 Tab  1   . Meloxicam (MOBIC) 15 MG OR  TABS one tablet daily for pain  30 Tab  1   . Omeprazole 20 MG OR TBEC take two capsules by mouth every day        . Prochlorperazine Maleate 10 MG OR TABS take one tablet by mouth every 6 hours as needed for nausea/headache  30 Tab  0   . Propranolol HCl 60 MG Oral Tab Once a day        . Zolpidem Tartrate (AMBIEN) 10 MG OR TABS take one tablet at bedtime as needed   10 Tab  1           Allergies:   Review of patient's allergies indicates:  Allergies   Allergen Reactions   . Amoxicillin      Unsure from childhood    . Penicillins      Unsure from childhood            Past Surgical History:  Past Surgical History   Procedure Date   . C-sections    . Gallbladder removed    . Appendectomy    . Tube tied    . Removal of tonsils            Social History:   The patient states her problem is not work related.  She is single and has 2 children.     History   Alcohol Use No         History   Tobacco Use Never           ROS:   Positive for   Eyes:  glasses/contacts  Heart:  high blood pressure  Stomach:  heartburn  Muscle/Bones:  joint pain  Neurologic:  numbness/tingling  Mental Health:  depression  . All other systems of the 14 reviewed are negative.      Hand & Upper Extremity Examination    Physical Examination  Gen: Patient is obese, alert, no distress    Psych: Alert and oriented times 3  Pleasant female. Mood and affect appropriate.    Skin:   warm, normal color and sweat patterns.  no abrasions, lacerations, or ecchymosis.    No palpable masses    Vascular: Fingers warm, pink, with brisk capillary refill    Neurologic: Sensation to light touch grossly intact over the median, radial, and ulnar distributions    Musculoskeletal:    Inspection of the  left upper extremity shows no gross deformity or evidence of atrophy.  No swelling over the left wrist.  Tender over left dorsal wrist over lunate and distal radius.  NegativeFinkelstein's  left wrist.  Test does cause wrist pain, but not over 1st dorsal compartment.    Supination/pronation: right: 30/90, left: 35/90  Wrist ROM: right: 55/60, left: 50/70  Full composite flexion/extension all digits    5/5 strength WE, WF, FE, FF, grip  5/5 strength APB, intrinsics        Studies: X-rays of the left wrist were reviewed and show increased inclination of the distal radius with ulnar negative variance consistent with Madelung deformity.  There is impaction of the lunate on the distal radius.  There is widening of her scapholunate interval.      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  The only way to correct this is surgically with a radius corrective osteotomy.  At this point we are recommending continued conservative management until pain  requires further intervention. We are sending the patient to therapy for a custom wrist splint to be worn for comfort.  She was also offered a steroid injection in the radiocarpal joint to decrease inflammation and pain. Patient would like to proceed with the injection.    Patient will follow-up with [place] on a prn basis.  Patient is happy with the plan.    Dr. Renaldo Reel  saw the patient, evaluated the patient, and formulated the plan to his satisfaction.      Thank you for allowing [place] to participate in the care of your patient.    Sincerely,    Santiago Bur, PA-C, ATC  [facility]  Orthopaedics & Sports Medicine  Hand and Upper Extremity Surgery    ATTENDING STATEMENT:    I personally evaluated and examined the patient above, and agree with the findings and plan outlined above.  Sharon Stephens is a 54 year old year old morbidly obese female who presents with 10 year history of bilateral wrist pain.  She had a both bone forearm fracture as a child treated with cast immobilization.  On exam, she has tenderness over the lunate and the lunate facet of the distal radius. Wrist flexion/extension of right: 55/60, left: 50/70. Radiographs demonstrate significant radial inclination angle with ulnar negative variance with ulnar subluxation of the lunate, consistent with a Madelung's deformity. Radiographs were not available of the right wrist, but patient states she has a similar deformity. Treatment options were reviewed with the patient including nonoperative vs. surgical treatment. Ms. Whitworth will likely benefit from a corrective osteotomy in the future. At this time, we recommended continuing with course of conservative management. She would like a cortisone injection today.    The risks and benefits of a corticosteroid injection were discussed. Potential side effects include but are not limited to infection, hematoma, allergic reaction, hypopigmentation/ skin blanching, fatty atrophy, risk of tendon rupture, and temporary spike in blood sugar. The patient agreed to the procedure, which was performed as described below.    After obtaining verbal consent and verifying patient identification, the region over the radiocarpal joint of the left wrist was prepped with alcohol and injected with 20 mg of kenalog with 1% lidocaine without any difficulty.      Azucena Fallen, MD  [facility]  [facility]  Department of Orthopaedics and Sports Medicine  Hand, Wrist, and Elbow Surgery

## 2009-11-30 ENCOUNTER — Ambulatory Visit (INDEPENDENT_AMBULATORY_CARE_PROVIDER_SITE_OTHER): Payer: Medicare Other | Admitting: Wound Care

## 2009-11-30 VITALS — BP 130/98 | HR 76 | Temp 98.0°F | Wt >= 6400 oz

## 2009-11-30 DIAGNOSIS — I10 Essential (primary) hypertension: Secondary | ICD-10-CM

## 2009-11-30 MED ORDER — LISINOPRIL 5 MG OR TABS
ORAL_TABLET | ORAL | Status: DC
Start: 2009-11-30 — End: 2010-02-23

## 2009-11-30 NOTE — Progress Notes (Signed)
Subjective:   Sharon Stephens is a 34 year old female with hypertension.     Current outpatient prescriptions   Medication Sig   . Albuterol 90 MCG/ACT IN AERS 2 puffs inhaled every 4 hours as needed for wheezing and/or cough   . Albuterol 90 MCG/ACT IN AERS two puff as needed    . BuPROPion HCl (WELLBUTRIN SR) 200 MG OR TB12 400 mg a day two tablet twice a day    . Cetirizine HCl (ZYRTEC) 10 MG OR TABS Take 1 tablet every day for allergies   . Cholecalciferol (VITAMIN D) 2000 UNIT OR CAPS one tablet daily    . Citalopram Hydrobromide 40 MG OR TABS once a day    . Eletriptan Hydrobromide (RELPAX) 40 MG OR TABS take one tablet by mouth at onset of headache    . Ketoconazole 2 % EX CREA apply to rash area three times a day   . Meloxicam (MOBIC) 15 MG OR TABS one tablet daily for pain   . Omeprazole 20 MG OR TBEC take two capsules by mouth every day    . Prochlorperazine Maleate 10 MG OR TABS take one tablet by mouth every 6 hours as needed for nausea/headache   . Propranolol HCl 40 MG Oral Tab 1 TABLET daily   . Zolpidem Tartrate (AMBIEN) 10 MG OR TABS take one tablet at bedtime as needed         Hypertension ROS: taking medications as instructed and no medication side effects noted.   New concerns: none. Using propanolol. No more headaches. Needs BP controlled for her upcoming bariatric surgery.    Objective:   BP 130/98  Pulse 76  Temp(Src) 98 F (36.7 C) (Temporal)  Wt 532 lb (241.314 kg)  LMP IUD   Appearance healthy, alert, no distress and cooperative.  General exam BP noted to be mildly elevated today in office.   RS-lungs clear to auscultation,no ralesrhonchi or wheezes  CVS- RRR no murmurs or rubs    Lab review: labs are reviewed, up to date and normal.     Assessment:    Hypertension - start lisinopril daily. See orders recheck 2 weeks

## 2009-11-30 NOTE — Progress Notes (Signed)
Why is patient here today? Sharon Stephens is here f/u on blood pressure. Patient states she is taking propranolol 40 mg.     Any new significant medical diagnoses since last visit? NO    Refills? YES: Mobic    Referral? NO    Letter or form? NO    Lab results? NO    Health maintenance issues? NO

## 2009-12-05 ENCOUNTER — Ambulatory Visit (HOSPITAL_BASED_OUTPATIENT_CLINIC_OR_DEPARTMENT_OTHER): Payer: Medicare Other | Attending: Clinical | Admitting: Clinical

## 2009-12-05 DIAGNOSIS — F331 Major depressive disorder, recurrent, moderate: Secondary | ICD-10-CM | POA: Insufficient documentation

## 2009-12-05 DIAGNOSIS — I10 Essential (primary) hypertension: Secondary | ICD-10-CM | POA: Insufficient documentation

## 2009-12-05 DIAGNOSIS — R7309 Other abnormal glucose: Secondary | ICD-10-CM | POA: Insufficient documentation

## 2009-12-05 DIAGNOSIS — G4730 Sleep apnea, unspecified: Secondary | ICD-10-CM | POA: Insufficient documentation

## 2009-12-05 DIAGNOSIS — F509 Eating disorder, unspecified: Secondary | ICD-10-CM

## 2009-12-05 DIAGNOSIS — F3289 Other specified depressive episodes: Secondary | ICD-10-CM | POA: Insufficient documentation

## 2009-12-05 DIAGNOSIS — R109 Unspecified abdominal pain: Secondary | ICD-10-CM | POA: Insufficient documentation

## 2009-12-06 ENCOUNTER — Ambulatory Visit: Payer: Medicare Other

## 2009-12-11 ENCOUNTER — Ambulatory Visit (HOSPITAL_BASED_OUTPATIENT_CLINIC_OR_DEPARTMENT_OTHER): Payer: Medicare Other | Attending: Clinical | Admitting: Clinical

## 2009-12-11 DIAGNOSIS — R7309 Other abnormal glucose: Secondary | ICD-10-CM | POA: Insufficient documentation

## 2009-12-11 DIAGNOSIS — F509 Eating disorder, unspecified: Secondary | ICD-10-CM | POA: Insufficient documentation

## 2009-12-11 DIAGNOSIS — F331 Major depressive disorder, recurrent, moderate: Secondary | ICD-10-CM | POA: Insufficient documentation

## 2009-12-11 DIAGNOSIS — I10 Essential (primary) hypertension: Secondary | ICD-10-CM | POA: Insufficient documentation

## 2009-12-11 DIAGNOSIS — G4730 Sleep apnea, unspecified: Secondary | ICD-10-CM | POA: Insufficient documentation

## 2009-12-11 DIAGNOSIS — R109 Unspecified abdominal pain: Secondary | ICD-10-CM | POA: Insufficient documentation

## 2009-12-14 ENCOUNTER — Ambulatory Visit (INDEPENDENT_AMBULATORY_CARE_PROVIDER_SITE_OTHER): Payer: Medicare Other | Admitting: Wound Care

## 2009-12-14 ENCOUNTER — Encounter (INDEPENDENT_AMBULATORY_CARE_PROVIDER_SITE_OTHER): Payer: Self-pay | Admitting: Wound Care

## 2009-12-14 VITALS — BP 120/86 | HR 84 | Resp 16 | Wt >= 6400 oz

## 2009-12-14 DIAGNOSIS — J309 Allergic rhinitis, unspecified: Secondary | ICD-10-CM

## 2009-12-14 DIAGNOSIS — R4589 Other symptoms and signs involving emotional state: Secondary | ICD-10-CM

## 2009-12-14 DIAGNOSIS — G43919 Migraine, unspecified, intractable, without status migrainosus: Secondary | ICD-10-CM

## 2009-12-14 DIAGNOSIS — R079 Chest pain, unspecified: Secondary | ICD-10-CM | POA: Insufficient documentation

## 2009-12-14 MED ORDER — M-VIT OR TABS
ORAL_TABLET | ORAL | Status: DC
Start: 2009-12-14 — End: 2010-11-04

## 2009-12-14 MED ORDER — CETIRIZINE HCL 10 MG OR TABS
ORAL_TABLET | ORAL | Status: DC
Start: 2009-12-14 — End: 2010-11-04

## 2009-12-14 MED ORDER — ASPIRIN 81 MG OR TBEC
DELAYED_RELEASE_TABLET | ORAL | Status: DC
Start: 2009-12-14 — End: 2010-09-27

## 2009-12-14 MED ORDER — CLONAZEPAM 1 MG OR TABS
ORAL_TABLET | ORAL | Status: DC
Start: 2009-12-14 — End: 2010-11-04

## 2009-12-14 NOTE — Progress Notes (Signed)
Why is patient here today? Pt is here for high blood pressure and discuss lab work. Pt states that requested records form her surgeons office in [place] where she had her gallbladder removed.      Any new significant medical diagnoses since last visit? NO    Refills? YES:     Referral? NO    Letter or form? NO    Lab results? NO    Health maintenance issues? NO

## 2009-12-14 NOTE — Progress Notes (Signed)
Date ROI Received: 12/14/2009    Request Sent to: [facility]    Request was sent via:Fax    Contact for follow UV:OZDGU to [phone]

## 2009-12-14 NOTE — Progress Notes (Signed)
Pt is a 34 year old female  here today for Follow up    Reviewed history obtained by MA and agree on 12/14/2009    She went to the ER for ongoing chest pain and foot issues.  She dropped a wt on her foot and hurt it but it got better. She has no issues with her foot since then.    She also went in for chestpain. It is now better. She gets this on and off. She has been ruled out for cardiaca event. At the ER they thought she may have a PE but did not have a CT machine that could accomodate her. So they watched her and she got better. She is asymptomatic today .           Patient Active Problem List   Diagnoses Code   . BENIGN HYPERTENSION 401.1   . MIGRAINE NOS INTRACTABLE 346.91          Current outpatient prescriptions   Medication Sig   . Albuterol 90 MCG/ACT IN AERS 2 puffs inhaled every 4 hours as needed for wheezing and/or cough   . Albuterol 90 MCG/ACT IN AERS two puff as needed    . Aspirin (SB LOW DOSE ASA EC) 81 MG Oral Tab EC 1 TABLET DAILY   . BuPROPion HCl (WELLBUTRIN SR) 200 MG OR TB12 400 mg a day two tablet twice a day    . Cetirizine HCl (ZYRTEC) 10 MG Oral Tab Take 1 tablet every day for allergies   . Cholecalciferol (VITAMIN D) 2000 UNIT OR CAPS one tablet daily    . Citalopram Hydrobromide 40 MG OR TABS once a day    . ClonazePAM 1 MG Oral Tab One tab as needed daily for stress.   . Eletriptan Hydrobromide (RELPAX) 40 MG OR TABS take one tablet by mouth at onset of headache    . Ketoconazole 2 % EX CREA apply to rash area three times a day   . Lisinopril 5 MG Oral Tab Take 1 tablet by mouth every day for HTN. new med   . Omeprazole 20 MG OR TBEC take two capsules by mouth every day    . Prenatal Vit-Fe Fumarate-FA (M-VIT) Oral Tab any daily multivitamin that is covered by insurance - once daily   . Prochlorperazine Maleate 10 MG OR TABS take one tablet by mouth every 6 hours as needed for nausea/headache   . Propranolol HCl 40 MG Oral Tab 1 TABLET daily   . Zolpidem Tartrate (AMBIEN) 10 MG OR TABS  take one tablet at bedtime as needed        History   Substance Use Topics   . Tobacco Use: Never   . Alcohol Use: No         review of systems:    constitutional Negative   cardiovascular Negative   respiratory Negative   Had some higher BP yesterday but not sure if used the right size cuff.  Psych increase stress. She is trying to lose wt. Doing well with wt loss . no h.o current abuse etc.      O/e  Gen alert    BP 120/86  Pulse 84  Resp 16  Wt 518 lb (234.963 kg)  LMP IUD  Morbid obese.  resp clear   CVS- RRR no murmurs or rubs        ASSESMENT AND PLAN  477.9 ALLERGIC RHINITIS NOS    Comment: refill medication   Plan: CETIRIZINE HCL 10  MG OR TABS            346.91 MIGRAINE NOS INTRACTABLE  Comment: she has stopped her propanolol. Just stopped it.   Plan: needs to take medication properly. Was started by neurologist for headaches. She was in the hosp for 7 days for headaches. Understands that medication need to be taken properly and if she has a s.e she needs to call not just stop them.    308.0 STRESS REACT, EMOTIONAL  Comment: use prn klonipin if needed. She thinks her chest pains are anxiety.  Plan: see if this helps. Call prn.     Greater than 50% of this visit was spent face to face with the patient in counseling and/or coordination of care, and/or discussing treatment/management options, and/or education with the patient and/or family member.  Total time spent was 25 minutes mostly on compliance of medication

## 2009-12-18 ENCOUNTER — Encounter (INDEPENDENT_AMBULATORY_CARE_PROVIDER_SITE_OTHER): Payer: Self-pay | Admitting: Wound Care

## 2009-12-18 NOTE — Telephone Encounter (Signed)
LVM advising we have not received records yet.  Request was faxed on 6/10 (4 days ago). Advised we will let her know when records are received.

## 2009-12-18 NOTE — Telephone Encounter (Signed)
CONFIRMED PHONE NUMBER: [phone]  CALLERS FIRST AND LAST NAME: Shellee Milo  FACILITY NAME: n/a TITLE: n/a  CALLERS RELATIONSHIP:Self  RETURN CALL: Detailed message on voicemail only    SUBJECT: General Message   REASON FOR REQUEST: per patient    MESSAGE: Recently signed release for clinic to obtain medical records from Dr. Adolphus Birchwood, pt would like to know if clinic was able to get those records, please call and let her know.  FORWARD TO: [place]

## 2009-12-19 ENCOUNTER — Ambulatory Visit (HOSPITAL_BASED_OUTPATIENT_CLINIC_OR_DEPARTMENT_OTHER): Payer: Medicare Other | Attending: Clinical | Admitting: Clinical

## 2009-12-19 ENCOUNTER — Telehealth (INDEPENDENT_AMBULATORY_CARE_PROVIDER_SITE_OTHER): Payer: Self-pay | Admitting: Wound Care

## 2009-12-19 DIAGNOSIS — R7309 Other abnormal glucose: Secondary | ICD-10-CM | POA: Insufficient documentation

## 2009-12-19 DIAGNOSIS — F331 Major depressive disorder, recurrent, moderate: Secondary | ICD-10-CM | POA: Insufficient documentation

## 2009-12-19 DIAGNOSIS — R109 Unspecified abdominal pain: Secondary | ICD-10-CM | POA: Insufficient documentation

## 2009-12-19 DIAGNOSIS — I10 Essential (primary) hypertension: Secondary | ICD-10-CM

## 2009-12-19 DIAGNOSIS — F509 Eating disorder, unspecified: Secondary | ICD-10-CM

## 2009-12-19 DIAGNOSIS — G4730 Sleep apnea, unspecified: Secondary | ICD-10-CM | POA: Insufficient documentation

## 2009-12-19 DIAGNOSIS — F3289 Other specified depressive episodes: Secondary | ICD-10-CM | POA: Insufficient documentation

## 2009-12-19 NOTE — Telephone Encounter (Signed)
CONFIRMED PHONE NUMBER: [phone]  CALLERS FIRST AND LAST NAME: Pershing Proud  FACILITY NAME: N/A TITLE: N/A  CALLERS RELATIONSHIP:Self  RETURN CALL: Detailed message on voicemail only    SUBJECT: MEDICATION CONCERN/QUESTION  [id number] [name], MD  Doctor who prescribed meds. [name], MD  Name of medication: ClonazePAM 1 MG Oral Tab  Question/Concern regarding medication: Pt stated this medication isn't addressing her pain, and she would like to try something else.  Pharmacy name and location: [facility] #[id number] [address] [place] [address]   Pharmacy phone number: [phone]  Callers name: Sharon Stephens  Daytime call back number: [phone] After 5: same Saturday: same   Okay to leave detailed VM or msg with someone at any of these numbers: YES

## 2009-12-19 NOTE — Telephone Encounter (Signed)
Clonazepam is not for pain but anxiety, please advise, thank you

## 2009-12-19 NOTE — Telephone Encounter (Signed)
Ask her to make an appt. It was given for stress.anxiety

## 2009-12-20 NOTE — Telephone Encounter (Signed)
Called pt and she made and appointment for tomorrow at 1 45 with Dr. Ennis Forts.

## 2009-12-20 NOTE — Telephone Encounter (Signed)
Called pt and left a message that I called and will call her back in the morning.

## 2009-12-20 NOTE — Telephone Encounter (Signed)
Called pt and left a message that I called and will try back this afternoon.

## 2009-12-21 ENCOUNTER — Ambulatory Visit (INDEPENDENT_AMBULATORY_CARE_PROVIDER_SITE_OTHER): Payer: Medicare Other | Admitting: Wound Care

## 2009-12-21 ENCOUNTER — Encounter (INDEPENDENT_AMBULATORY_CARE_PROVIDER_SITE_OTHER): Payer: Self-pay | Admitting: Wound Care

## 2009-12-21 VITALS — BP 118/80 | HR 76 | Temp 99.1°F | Resp 16 | Wt >= 6400 oz

## 2009-12-21 DIAGNOSIS — K219 Gastro-esophageal reflux disease without esophagitis: Secondary | ICD-10-CM

## 2009-12-21 NOTE — Progress Notes (Signed)
Why is patient here today? Pt is here for a follow up on pain in between her breasts. Pt states that anxiety medication did not help.     Any new significant medical diagnoses since last visit? NO    Refills? NO    Referral? NO    Letter or form? NO    Lab results? NO    Health maintenance issues? NO

## 2009-12-21 NOTE — Telephone Encounter (Signed)
We have not yet received records.

## 2009-12-24 NOTE — Progress Notes (Signed)
Pt is a 34 year old female  here today for pain   Reviewed history obtained by MA and agree on 12/24/2009    Pt states that she has pain inbetween her breasts and upper abdomen for years. I have done testing with neg findings.  She is here to see what else can be done. No fever and chills   Recent hosp admission for chest pains - neg card work up.    She states that the pain was better when she was heavier. Now that she is trying to lose wt it has returned.  Feels like a pull inside.     Patient Active Problem List   Diagnoses Code   . BENIGN HYPERTENSION 401.1   . MIGRAINE NOS INTRACTABLE 346.91   . MORBID OBESITY 278.01   . CHEST PAIN NOS 786.50          Current outpatient prescriptions   Medication Sig   . Albuterol 90 MCG/ACT IN AERS 2 puffs inhaled every 4 hours as needed for wheezing and/or cough   . Albuterol 90 MCG/ACT IN AERS two puff as needed    . Aspirin (SB LOW DOSE ASA EC) 81 MG Oral Tab EC 1 TABLET DAILY   . BuPROPion HCl (WELLBUTRIN SR) 200 MG OR TB12 400 mg a day two tablet twice a day    . Cetirizine HCl (ZYRTEC) 10 MG Oral Tab Take 1 tablet every day for allergies   . Cholecalciferol (VITAMIN D) 2000 UNIT OR CAPS one tablet daily    . Citalopram Hydrobromide 40 MG OR TABS once a day    . ClonazePAM 1 MG Oral Tab One tab as needed daily for stress.   . Eletriptan Hydrobromide (RELPAX) 40 MG OR TABS take one tablet by mouth at onset of headache    . Ketoconazole 2 % EX CREA apply to rash area three times a day   . Lisinopril 5 MG Oral Tab Take 1 tablet by mouth every day for HTN. new med   . Omeprazole 20 MG OR TBEC take two capsules by mouth every day    . Prenatal Vit-Fe Fumarate-FA (M-VIT) Oral Tab any daily multivitamin that is covered by insurance - once daily   . Prochlorperazine Maleate 10 MG OR TABS take one tablet by mouth every 6 hours as needed for nausea/headache   . Propranolol HCl 40 MG Oral Tab 1 TABLET daily   . Zolpidem Tartrate (AMBIEN) 10 MG OR TABS take one tablet at bedtime as  needed        History   Substance Use Topics   . Tobacco Use: Never   . Alcohol Use: No           O/e  Gen alert    BP 118/80  Pulse 76  Temp(Src) 99.1 F (37.3 C) (Oral)  Resp 16  Wt 517 lb (234.51 kg)  LMP IUD  Skin chest wall neg  Abdomen- soft, nontender,non distended +BS ( extremely obese and very diff exam)      ASSESMENT AND PLAN  530.81 ESOPHAGEAL REFLUX  (primary encounter diagnosis)  Comment: not had EGD before. Will see if GI can help schedule her. We need to look inside to see what is going on.   Plan: REFERRAL TO UPPER GI ENDOSCOPY         Ct PPI .     [name], MD

## 2009-12-25 ENCOUNTER — Ambulatory Visit (HOSPITAL_BASED_OUTPATIENT_CLINIC_OR_DEPARTMENT_OTHER): Payer: Medicare Other | Attending: Clinical | Admitting: Psychiatry

## 2009-12-25 ENCOUNTER — Telehealth (INDEPENDENT_AMBULATORY_CARE_PROVIDER_SITE_OTHER): Payer: Self-pay | Admitting: Wound Care

## 2009-12-25 ENCOUNTER — Ambulatory Visit (HOSPITAL_BASED_OUTPATIENT_CLINIC_OR_DEPARTMENT_OTHER): Payer: Medicare Other | Admitting: Clinical

## 2009-12-25 DIAGNOSIS — F331 Major depressive disorder, recurrent, moderate: Secondary | ICD-10-CM

## 2009-12-25 DIAGNOSIS — R7309 Other abnormal glucose: Secondary | ICD-10-CM | POA: Insufficient documentation

## 2009-12-25 DIAGNOSIS — R109 Unspecified abdominal pain: Secondary | ICD-10-CM | POA: Insufficient documentation

## 2009-12-25 DIAGNOSIS — I10 Essential (primary) hypertension: Secondary | ICD-10-CM

## 2009-12-25 DIAGNOSIS — F509 Eating disorder, unspecified: Secondary | ICD-10-CM | POA: Insufficient documentation

## 2009-12-25 DIAGNOSIS — G4730 Sleep apnea, unspecified: Secondary | ICD-10-CM | POA: Insufficient documentation

## 2009-12-25 NOTE — Telephone Encounter (Signed)
Triage Nurse Telephone Encounter  Chief Complaint:Chest pain  Reviewed Problem List, Medications and Allergies: YES    Description of symptoms: The patient was seen for reflux 06.17.11, she has an EGD scheduled 07.06.11, today she reports increased constant midline chest pain, described as "sharp," rated 8 out of 10, started 2 days ago, She has taken Omeprazole without a change in SX, denies SOB, dizzy, weak, pallor, diaphoresis, N/V, palpitation, hemoptysis.    ROS: negative per protocol except as noted in history  Protocol used for assessment: chest pain    Recommended disposition: Call 911  Caller agrees:  NO    PLAN: Declines 911 or ED, scheduled an appointment tomorrow with Dr Ennis Forts as requested.    Home care instructions provided:  YES  Instructed to call back or seek tx if sxs worsen or has new concerns: YES   Caller understands:  YES  Follow up call needed  NO  Reference used: [employer] 3rd edition, Telephone Triage Protocols for Nurses        '

## 2009-12-26 ENCOUNTER — Ambulatory Visit (INDEPENDENT_AMBULATORY_CARE_PROVIDER_SITE_OTHER): Payer: Medicare Other | Admitting: Wound Care

## 2009-12-27 ENCOUNTER — Encounter (HOSPITAL_BASED_OUTPATIENT_CLINIC_OR_DEPARTMENT_OTHER): Payer: Medicaid Other | Admitting: Neurology

## 2009-12-27 NOTE — Telephone Encounter (Signed)
Spoke to [facility] and was advised pt has never been seen there. LVM for pt advising and requesting follow up

## 2009-12-31 ENCOUNTER — Ambulatory Visit: Payer: Medicare Other | Attending: Surgery | Admitting: Surgery

## 2009-12-31 ENCOUNTER — Encounter (HOSPITAL_BASED_OUTPATIENT_CLINIC_OR_DEPARTMENT_OTHER): Payer: Medicaid Other | Admitting: Family

## 2009-12-31 DIAGNOSIS — G4733 Obstructive sleep apnea (adult) (pediatric): Secondary | ICD-10-CM | POA: Insufficient documentation

## 2009-12-31 DIAGNOSIS — E119 Type 2 diabetes mellitus without complications: Secondary | ICD-10-CM | POA: Insufficient documentation

## 2009-12-31 DIAGNOSIS — I10 Essential (primary) hypertension: Secondary | ICD-10-CM | POA: Insufficient documentation

## 2010-01-04 ENCOUNTER — Ambulatory Visit: Payer: Medicare Other | Attending: Surgery | Admitting: Rehabilitative and Restorative Service Providers"

## 2010-01-04 ENCOUNTER — Ambulatory Visit: Payer: Medicare Other | Attending: Surgery | Admitting: Registered"

## 2010-01-04 ENCOUNTER — Ambulatory Visit (INDEPENDENT_AMBULATORY_CARE_PROVIDER_SITE_OTHER): Payer: Medicare Other | Admitting: Wound Care

## 2010-01-04 ENCOUNTER — Encounter (INDEPENDENT_AMBULATORY_CARE_PROVIDER_SITE_OTHER): Payer: Self-pay | Admitting: Wound Care

## 2010-01-04 VITALS — BP 120/90 | HR 72 | Resp 16 | Wt >= 6400 oz

## 2010-01-04 DIAGNOSIS — I10 Essential (primary) hypertension: Secondary | ICD-10-CM | POA: Insufficient documentation

## 2010-01-04 DIAGNOSIS — IMO0001 Reserved for inherently not codable concepts without codable children: Secondary | ICD-10-CM | POA: Insufficient documentation

## 2010-01-04 DIAGNOSIS — M79609 Pain in unspecified limb: Secondary | ICD-10-CM

## 2010-01-04 NOTE — Progress Notes (Signed)
Why is patient here today? Pt is here for a follow up. Pt would like to have her L ankle checked. Pt states that she injured her foot while walking Tuesday.     Any new significant medical diagnoses since last visit? NO    Refills? NO    Referral? NO    Letter or form? NO    Lab results? YES: [place]     Health maintenance issues? NO

## 2010-01-04 NOTE — Progress Notes (Signed)
Pt is a 34 year old female  here today for HTN/ankle pain  Reviewed history obtained by MA and agree on 01/04/2010    HTN stable. Taking medication without compalints.    Left ankle- doing more exercise at present. Twisted her ankle while walking.   Can walk on it but wants it checked. No redness. Mild swelling on it.         Patient Active Problem List   Diagnoses Code   . BENIGN HYPERTENSION 401.1   . MIGRAINE NOS INTRACTABLE 346.91   . MORBID OBESITY 278.01   . CHEST PAIN NOS 786.50          Current outpatient prescriptions   Medication Sig   . Albuterol 90 MCG/ACT IN AERS 2 puffs inhaled every 4 hours as needed for wheezing and/or cough   . Albuterol 90 MCG/ACT IN AERS two puff as needed    . Aspirin (SB LOW DOSE ASA EC) 81 MG Oral Tab EC 1 TABLET DAILY   . BuPROPion HCl (WELLBUTRIN SR) 200 MG OR TB12 400 mg a day two tablet twice a day    . Cetirizine HCl (ZYRTEC) 10 MG Oral Tab Take 1 tablet every day for allergies   . Cholecalciferol (VITAMIN D) 2000 UNIT OR CAPS one tablet daily    . Citalopram Hydrobromide 40 MG OR TABS once a day    . ClonazePAM 1 MG Oral Tab One tab as needed daily for stress.   . Eletriptan Hydrobromide (RELPAX) 40 MG OR TABS take one tablet by mouth at onset of headache    . Ketoconazole 2 % EX CREA apply to rash area three times a day   . Lisinopril 5 MG Oral Tab Take 1 tablet by mouth every day for HTN. new med   . Omeprazole 20 MG OR TBEC take two capsules by mouth every day    . Prenatal Vit-Fe Fumarate-FA (M-VIT) Oral Tab any daily multivitamin that is covered by insurance - once daily   . Prochlorperazine Maleate 10 MG OR TABS take one tablet by mouth every 6 hours as needed for nausea/headache   . Propranolol HCl 40 MG Oral Tab 1 TABLET daily   . Zaleplon 10 MG Oral Cap Take one capsule by mouth nightly at bedtime          History   Substance Use Topics   . Tobacco Use: Never   . Alcohol Use: No         review of systems:    cardiovascular Negative   gastrointestinal  Negative has  egd appt.      O/e  Gen alert    BP 120/90  Pulse 72  Resp 16  Wt 527 lb (239.046 kg)  LMP IUD  Obese  Left ankle - mild swelling. ROM normal. Tenderness is nonspecific over dorsum of foot. No bruising seen.  No tenderness over foot bones.     Xray - due to wt limit she will go to [facility] to get it done.    ASSESMENT AND PLAN  401.1 BENIGN HYPERTENSION  (primary encounter diagnosis)  Comment: well controlled overall.  Plan: walking is very important for her to lose wt. Will see if podiatry can help,   Rest ice for now. See if it improves on its own.      729.5 PAIN IN LIMB  Comment: REFERRAL TO PODIATRY           Plan: X-RAY ANKLE 3+ VW, X-RAY FOOT 3+ VW

## 2010-01-09 ENCOUNTER — Encounter (HOSPITAL_BASED_OUTPATIENT_CLINIC_OR_DEPARTMENT_OTHER): Payer: Medicare Other

## 2010-01-09 ENCOUNTER — Ambulatory Visit: Payer: Medicare Other | Attending: Gastroenterology | Admitting: Gastroenterology

## 2010-01-09 ENCOUNTER — Encounter (HOSPITAL_BASED_OUTPATIENT_CLINIC_OR_DEPARTMENT_OTHER): Payer: Self-pay | Admitting: Gastroenterology

## 2010-01-09 DIAGNOSIS — R079 Chest pain, unspecified: Secondary | ICD-10-CM | POA: Insufficient documentation

## 2010-01-14 ENCOUNTER — Ambulatory Visit (HOSPITAL_BASED_OUTPATIENT_CLINIC_OR_DEPARTMENT_OTHER): Payer: Medicare Other | Admitting: Psychiatry

## 2010-01-14 ENCOUNTER — Ambulatory Visit (HOSPITAL_BASED_OUTPATIENT_CLINIC_OR_DEPARTMENT_OTHER): Payer: Medicare Other | Attending: Adult Health | Admitting: Adult Health

## 2010-01-14 ENCOUNTER — Ambulatory Visit (HOSPITAL_BASED_OUTPATIENT_CLINIC_OR_DEPARTMENT_OTHER): Payer: Medicare Other | Admitting: Rehabilitative and Restorative Service Providers"

## 2010-01-14 DIAGNOSIS — M659 Synovitis and tenosynovitis, unspecified: Secondary | ICD-10-CM | POA: Insufficient documentation

## 2010-01-14 DIAGNOSIS — M19079 Primary osteoarthritis, unspecified ankle and foot: Secondary | ICD-10-CM | POA: Insufficient documentation

## 2010-01-14 DIAGNOSIS — M25579 Pain in unspecified ankle and joints of unspecified foot: Secondary | ICD-10-CM | POA: Insufficient documentation

## 2010-01-14 DIAGNOSIS — M79609 Pain in unspecified limb: Secondary | ICD-10-CM

## 2010-01-14 DIAGNOSIS — G43909 Migraine, unspecified, not intractable, without status migrainosus: Secondary | ICD-10-CM | POA: Insufficient documentation

## 2010-01-14 DIAGNOSIS — F509 Eating disorder, unspecified: Secondary | ICD-10-CM | POA: Insufficient documentation

## 2010-01-14 DIAGNOSIS — M216X9 Other acquired deformities of unspecified foot: Secondary | ICD-10-CM

## 2010-01-14 DIAGNOSIS — M65979 Unspecified synovitis and tenosynovitis, unspecified ankle and foot: Secondary | ICD-10-CM | POA: Insufficient documentation

## 2010-01-14 DIAGNOSIS — G4733 Obstructive sleep apnea (adult) (pediatric): Secondary | ICD-10-CM | POA: Insufficient documentation

## 2010-01-14 DIAGNOSIS — E669 Obesity, unspecified: Secondary | ICD-10-CM | POA: Insufficient documentation

## 2010-01-21 ENCOUNTER — Ambulatory Visit (HOSPITAL_BASED_OUTPATIENT_CLINIC_OR_DEPARTMENT_OTHER): Payer: Medicare Other | Admitting: Rehabilitative and Restorative Service Providers"

## 2010-01-21 ENCOUNTER — Ambulatory Visit (HOSPITAL_BASED_OUTPATIENT_CLINIC_OR_DEPARTMENT_OTHER): Payer: Medicare Other | Attending: Psychiatry | Admitting: Psychiatry

## 2010-01-21 DIAGNOSIS — E669 Obesity, unspecified: Secondary | ICD-10-CM | POA: Insufficient documentation

## 2010-01-21 DIAGNOSIS — G43909 Migraine, unspecified, not intractable, without status migrainosus: Secondary | ICD-10-CM | POA: Insufficient documentation

## 2010-01-21 DIAGNOSIS — F509 Eating disorder, unspecified: Secondary | ICD-10-CM | POA: Insufficient documentation

## 2010-01-21 DIAGNOSIS — G4733 Obstructive sleep apnea (adult) (pediatric): Secondary | ICD-10-CM | POA: Insufficient documentation

## 2010-01-25 ENCOUNTER — Ambulatory Visit: Payer: Medicare Other | Attending: Internal Medicine

## 2010-01-25 ENCOUNTER — Ambulatory Visit (HOSPITAL_BASED_OUTPATIENT_CLINIC_OR_DEPARTMENT_OTHER): Payer: Medicare Other | Admitting: Internal Medicine

## 2010-01-25 DIAGNOSIS — Z01810 Encounter for preprocedural cardiovascular examination: Secondary | ICD-10-CM | POA: Insufficient documentation

## 2010-01-25 DIAGNOSIS — G4730 Sleep apnea, unspecified: Secondary | ICD-10-CM | POA: Insufficient documentation

## 2010-01-25 DIAGNOSIS — F3289 Other specified depressive episodes: Secondary | ICD-10-CM | POA: Insufficient documentation

## 2010-01-25 DIAGNOSIS — G4733 Obstructive sleep apnea (adult) (pediatric): Secondary | ICD-10-CM

## 2010-01-25 DIAGNOSIS — R072 Precordial pain: Secondary | ICD-10-CM | POA: Insufficient documentation

## 2010-01-25 DIAGNOSIS — R1013 Epigastric pain: Secondary | ICD-10-CM | POA: Insufficient documentation

## 2010-01-25 DIAGNOSIS — G8929 Other chronic pain: Secondary | ICD-10-CM | POA: Insufficient documentation

## 2010-01-25 DIAGNOSIS — I10 Essential (primary) hypertension: Secondary | ICD-10-CM

## 2010-01-25 DIAGNOSIS — G43909 Migraine, unspecified, not intractable, without status migrainosus: Secondary | ICD-10-CM | POA: Insufficient documentation

## 2010-01-25 DIAGNOSIS — Z01818 Encounter for other preprocedural examination: Secondary | ICD-10-CM | POA: Insufficient documentation

## 2010-01-25 DIAGNOSIS — M129 Arthropathy, unspecified: Secondary | ICD-10-CM | POA: Insufficient documentation

## 2010-01-28 ENCOUNTER — Ambulatory Visit (HOSPITAL_BASED_OUTPATIENT_CLINIC_OR_DEPARTMENT_OTHER): Payer: Medicare Other | Attending: Psychiatry | Admitting: Psychiatry

## 2010-01-28 ENCOUNTER — Ambulatory Visit (HOSPITAL_BASED_OUTPATIENT_CLINIC_OR_DEPARTMENT_OTHER): Payer: Medicare Other | Admitting: Rehabilitative and Restorative Service Providers"

## 2010-01-28 DIAGNOSIS — G43909 Migraine, unspecified, not intractable, without status migrainosus: Secondary | ICD-10-CM | POA: Insufficient documentation

## 2010-01-28 DIAGNOSIS — E669 Obesity, unspecified: Secondary | ICD-10-CM | POA: Insufficient documentation

## 2010-01-28 DIAGNOSIS — F329 Major depressive disorder, single episode, unspecified: Secondary | ICD-10-CM | POA: Insufficient documentation

## 2010-01-28 DIAGNOSIS — G4733 Obstructive sleep apnea (adult) (pediatric): Secondary | ICD-10-CM | POA: Insufficient documentation

## 2010-01-28 DIAGNOSIS — F509 Eating disorder, unspecified: Secondary | ICD-10-CM | POA: Insufficient documentation

## 2010-01-29 ENCOUNTER — Encounter (HOSPITAL_BASED_OUTPATIENT_CLINIC_OR_DEPARTMENT_OTHER): Payer: Self-pay | Admitting: Obstetrics & Gynecology

## 2010-01-29 ENCOUNTER — Ambulatory Visit (HOSPITAL_BASED_OUTPATIENT_CLINIC_OR_DEPARTMENT_OTHER): Payer: Medicare Other | Attending: Obstetrics & Gynecology | Admitting: Obstetrics & Gynecology

## 2010-01-29 VITALS — BP 132/83 | HR 100 | Temp 96.8°F | Ht 66.0 in | Wt >= 6400 oz

## 2010-01-29 DIAGNOSIS — Z9851 Tubal ligation status: Secondary | ICD-10-CM | POA: Insufficient documentation

## 2010-01-29 DIAGNOSIS — Z975 Presence of (intrauterine) contraceptive device: Secondary | ICD-10-CM | POA: Insufficient documentation

## 2010-01-29 DIAGNOSIS — R1031 Right lower quadrant pain: Secondary | ICD-10-CM | POA: Insufficient documentation

## 2010-01-29 DIAGNOSIS — Z6841 Body Mass Index (BMI) 40.0 and over, adult: Secondary | ICD-10-CM | POA: Insufficient documentation

## 2010-01-29 DIAGNOSIS — N949 Unspecified condition associated with female genital organs and menstrual cycle: Secondary | ICD-10-CM | POA: Insufficient documentation

## 2010-01-29 DIAGNOSIS — E669 Obesity, unspecified: Secondary | ICD-10-CM

## 2010-01-29 DIAGNOSIS — R109 Unspecified abdominal pain: Secondary | ICD-10-CM

## 2010-01-29 NOTE — Progress Notes (Signed)
This note was dictated.

## 2010-01-30 ENCOUNTER — Ambulatory Visit: Payer: Medicare Other | Attending: Surgery | Admitting: Clinical

## 2010-01-30 ENCOUNTER — Ambulatory Visit (HOSPITAL_BASED_OUTPATIENT_CLINIC_OR_DEPARTMENT_OTHER): Payer: Medicare Other | Attending: Family | Admitting: Family

## 2010-01-30 DIAGNOSIS — G4733 Obstructive sleep apnea (adult) (pediatric): Secondary | ICD-10-CM

## 2010-01-30 NOTE — Progress Notes (Signed)
Transcription accepted by Eustace Pen ANN on 01/30/2010 at  4:31 PM  ------  CHIEF COMPLAINT:  The patient is a 34 year old G2, P2 with a past medical history significant for morbid obesity, here for evaluation of right lower quadrant pain, now resolved.      HISTORY OF PRESENT ILLNESS:  Ms. Hollifield is a 34 year old G2 P2 who has a past medical history significant for morbid obesity with a BMI of 83.1.  She has had two prior C-sections and a tubal ligation.  Because she had a history of menorrhagia on January 22, 2009 she underwent a dilatation and curettage which found a benign polyp in her uterus and an IUD was placed.  She has had oligomenorrhea since, and has had no issues with menorrhagia.  She is also status post an appendectomy in the past.  At that time, her appendix was noted to be normal but she did have multiple ovarian cysts.  She no longer has her appendix, and therefore is not being worked up for appendicitis.  The pain started about a week ago.  She rated it about 4 to 5/10 on a pain scale.  It lasted for about 2 to 3 days, and then now has currently resolved.  She had no nausea, or vomiting, no fevers, chills, no changes in her bowel or bladder habits.  The patient has also lost 37 pounds since May as she is trying to become a candidate for a gastric bypass surgery.  She is not currently sexually active.  She does not have any vaginal discharge or symptoms.  When she is sexually active she denies dyspareunia.      PAST MEDICAL HISTORY:  1.  Allergies, which include penicillin, which occurred in childhood, and she has no record of what that was.  2.  Arthritis.  3.  Headaches.  4.  Hypertension, for which she is treated with propanolol.  5.  She also has depression.      PAST SURGICAL HISTORY:  1.  She has had an appendectomy.  2.  Two C-sections.  3.  A cholecystectomy.  4.  A tubal ligation.  5.  Dilatation and curettage.      OB HISTORY:  1.  October 05, 1997, she underwent a C-section at 32 weeks due  to severe pre-eclampsia, in [place], [place].  2.  June 12, 2005, she underwent a C-section at 32 weeks due to severe pre-eclampsia, and then was hospitalized for two and a half months    GYN HISTORY.  She underwent menarche at age of 34, and her last menstrual period was October 2010, at which time flow was limited to spotting.She has oligomenorrhea due to a Mirena IUD placement.  The Mirena IUD was placed for severe menorrhagia.  At that time she was also noted to have a polyp in her uterus and underwent a dilatation and curettage, which had benign findings.  She has had no issues with her IUD.  When she gets her period she does have dysmenorrhea, that she rates as severe; however, she has become almost amenorrheic with the IUD in place.  She is not currently sexually active.  Coitarche at the age of 7, consensual.  She has had two different partners in the last year and has had 11 total lifetime partners.  She had an HIV test in 2009.  She is not currently sexually active.  She has a history of herpes, but denies any history of  chlamydia, gonorrhea, or other sexually transmitted illnesses.  She has had five Pap smear in five years.  They have all been within normal limits.  Her last Pap was in November 2010.  She states that her OB/GYN had told her that with three consecutive normal Pap smears she was now on a q-three year Pap cycle.  Again, she denies any symptoms of vaginitis.      SOCIAL HISTORY:  The patient lives in [place] with three friends and her two children.  She moved to [place] from [place], [place] to "get away from the father of her children."  She denies any history of sexual or physical abuse, and states that the FOB has not been physically abusive, that she wanted to get away from him because "he's just plain crazy."  She is actually here with his family.  She is not currently employed.  She denies any exposure to occupational hazards.  She wears a seatbelt part of the  time.  She is unable to wear a seatbelt regularly because of her habitus.  She has no guns in her home.  She does not ride a bike.  She is trying to exercise more regularly by walking.  She is taking a number of vitamin supplements in preparation for bariatric surgery and she is also on an active weight loss plan.  As mentioned above, she has lost 37 pounds since May.  She is a former smoker, smoked a half a pack a day for three years.  She quit in 1999.  She denies alcohol or recreational drug use.      FAMILY HISTORY:  She has a mother who had juvenile diabetes and died at age 32 due to a massive heart attack.  Her grandfather had stomach cancer and bone cancer. Her mother who had juvenile diabetes and died at age 32  Her mother and her grandparents both had high blood pressure.  She states that her mother was not significantly overweight.  Her grandmother died of uterine cancer in 19.      MEDICATIONS:  1.  Vitamin D 2000 mg once daily.  2.  Propanolol 40 mg once daily.  3.  Wellbutrin 400 mg once daily.  4.  Citalopram 60 mg once daily.  5.  Omeprazole 20 mg twice a day.  6.  Clonazepam 1 mg PRN.  7.  Trazodone 50 mg at bedtime.  8.  Zyrtec 10 mg once daily.  9.  Prenatal vitamin one tab po daily.  10.  Relpax 4 gmPRN for migraines.  11.  Aspirin 81 mg once daily.      ALLERGIES:  PENICILLIN, UNKNOWN REACTION OCCURRED IN CHILDHOOD.  MOTHER IS NO LONGER LIVING AND CANNOT RELATE HISTORY.      REVIEW OF SYSTEMS:  As per HPI.  Otherwise, remaining systems are negative.  The patient specifically states that she has no abdominal pain, no dysuria, no hematuria, no fatigue, fever, current headache, no unintended weight loss or gain, and no chest or lower back ache pain.      PHYSICAL EXAMINATION:    Vital signs :    Blood pressure 132/83.  Temperature is 36 degrees Celsius.  Heart rate is 100.  Height 5 feet, 6.  Weight 513 pounds.  BMI is 83.1.  Last menstrual period is April 06, 2009 in the setting of IUD use.    General:    This is a pleasant woman in no apparent  distress.  Morbidly obese.    Heent:    The patient is wearing glasses, otherwise normocephalic and atraumatic, no conjunctival injection, scleral icterus, or ptosis.  Cardiovascular:    Regular rate and rhythm,  no tachycardia appreciated on auscultation.    Respiratory:    Clear to auscultation bilaterally.    Abdomen:    Abdomen is morbidly obese, soft and non-tender.    Extremities:    Show no lower extremity edema.    Skin:    No rashes, cyanosis, or petechiae.  Normal hair pattern for sex and age.    Psych:    Bright and reactive affect.    Neuro:    Alert and oriented.  Gait impaired by habitus but otherwise normal gait and coordination.        ASSESSMENT AND PLAN:  Ms. Almanzar is a 34 year old, G2, P2, with a past medical history significant for morbid obesity.  Status post BTL with Mirena IUD in place, here for right lower quadrant pain attributed to ovarian cysts; however, the patient is asymptomatic at present.    1.  GYN.  The patient is not currently symptomatic.  She has a Mirena IUD in place.  It is likely that she experienced either a mittelschmerz associated with ovulation, or had a corpus luteum cyst that burst.  She is not currently having any pain and her presentation at that time sounds relatively benign, given that she only rated her pain 4/10 and did not have nausea and vomiting associated with it.  She was counseled that our major concern with symptoms such as those is that she may have developed an ovarian cyst that caused a torsion.  However, since the pain has resolved and not recurred we are not concerned about that at this time.  She was given warning signs and appropriate information on when to followup should she develop the symptoms.  Given that she has a BTL and a Mirena IUD in place she is currently amenorrheic/oligomenorrheic.  We do not think it is appropriate to give her oral contraceptives to assess cysts, since there is nothing that we would really do, given that they are  asymptomatic in nature.  2.  Her other health needs are being managed by a PCP who is preparing her for bariatric surgery.  3.  With regard to health maintenance, she is up to date on her Pap smears and is now on a q-three year schedule.  She does not need to have a speculum exam today.  She will followup in November for her annual well woman exam.

## 2010-02-03 NOTE — Progress Notes (Addendum)
------------------------------------------    Attending: Mathews Argyle, MD  I saw and evaluated the patient and agree with Dr. MSorley's note.   Additional diagnoses: none  ----------------------------------------

## 2010-02-04 ENCOUNTER — Ambulatory Visit (HOSPITAL_BASED_OUTPATIENT_CLINIC_OR_DEPARTMENT_OTHER): Payer: Medicare Other | Attending: Psychiatry | Admitting: Psychiatry

## 2010-02-04 ENCOUNTER — Encounter (HOSPITAL_BASED_OUTPATIENT_CLINIC_OR_DEPARTMENT_OTHER): Payer: Self-pay | Admitting: Rehabilitative and Restorative Service Providers"

## 2010-02-04 DIAGNOSIS — G4733 Obstructive sleep apnea (adult) (pediatric): Secondary | ICD-10-CM | POA: Insufficient documentation

## 2010-02-04 DIAGNOSIS — G43909 Migraine, unspecified, not intractable, without status migrainosus: Secondary | ICD-10-CM | POA: Insufficient documentation

## 2010-02-04 DIAGNOSIS — F509 Eating disorder, unspecified: Secondary | ICD-10-CM | POA: Insufficient documentation

## 2010-02-04 DIAGNOSIS — E669 Obesity, unspecified: Secondary | ICD-10-CM | POA: Insufficient documentation

## 2010-02-08 ENCOUNTER — Ambulatory Visit (INDEPENDENT_AMBULATORY_CARE_PROVIDER_SITE_OTHER): Payer: Medicare Other | Admitting: Wound Care

## 2010-02-08 ENCOUNTER — Telehealth (INDEPENDENT_AMBULATORY_CARE_PROVIDER_SITE_OTHER): Payer: Self-pay | Admitting: Wound Care

## 2010-02-08 ENCOUNTER — Encounter (INDEPENDENT_AMBULATORY_CARE_PROVIDER_SITE_OTHER): Payer: Self-pay | Admitting: Wound Care

## 2010-02-08 VITALS — BP 100/70 | HR 68 | Resp 16 | Wt >= 6400 oz

## 2010-02-08 DIAGNOSIS — M19079 Primary osteoarthritis, unspecified ankle and foot: Secondary | ICD-10-CM

## 2010-02-08 DIAGNOSIS — G4700 Insomnia, unspecified: Secondary | ICD-10-CM

## 2010-02-08 MED ORDER — CELECOXIB 200 MG OR CAPS
ORAL_CAPSULE | ORAL | Status: DC
Start: 2010-02-08 — End: 2010-11-04

## 2010-02-08 NOTE — Progress Notes (Signed)
Pt is a 34 year old female  here today for foot pain   Reviewed history obtained by MA and agree on 02/08/2010    Pt states that her foot is hurting with walking. She is getting more active.  She is on bariatric schedule for surgery. Heel hurts . mobic used to help but not anymore.  She saw podiatrist . No problems except for arthritis. Wants some medication to help with pain.    She used to be on Celebrex prior to moving here and it worked really well. She would like to try this again      Patient Active Problem List   Diagnoses Code   . BENIGN HYPERTENSION 401.1   . MIGRAINE NOS INTRACTABLE 346.91   . MORBID OBESITY 278.01   . CHEST PAIN NOS 786.50          Current outpatient prescriptions   Medication Sig   . Albuterol 90 MCG/ACT IN AERS 2 puffs inhaled every 4 hours as needed for wheezing and/or cough   . Albuterol 90 MCG/ACT IN AERS two puff as needed    . Aspirin (SB LOW DOSE ASA EC) 81 MG Oral Tab EC 1 TABLET DAILY   . BuPROPion HCl (WELLBUTRIN SR) 200 MG OR TB12 400 mg a day two tablet twice a day    . Cetirizine HCl (ZYRTEC) 10 MG Oral Tab Take 1 tablet every day for allergies   . Cholecalciferol (VITAMIN D) 2000 UNIT OR CAPS one tablet daily    . Citalopram Hydrobromide 40 MG OR TABS once a day    . ClonazePAM 1 MG Oral Tab One tab as needed daily for stress.   . Eletriptan Hydrobromide (RELPAX) 40 MG OR TABS take one tablet by mouth at onset of headache    . Ketoconazole 2 % EX CREA apply to rash area three times a day   . Lisinopril 5 MG Oral Tab Take 1 tablet by mouth every day for HTN. new med   . Omeprazole 20 MG OR TBEC take two capsules by mouth every day    . Prenatal Vit-Fe Fumarate-FA (M-VIT) Oral Tab any daily multivitamin that is covered by insurance - once daily   . Prochlorperazine Maleate 10 MG OR TABS take one tablet by mouth every 6 hours as needed for nausea/headache   . Propranolol HCl 40 MG Oral Tab 1 TABLET daily       History   Substance Use Topics   . Tobacco Use: Never   . Alcohol  Use: No         O/e  Gen alert    BP 100/70  Pulse 68  Resp 16  Wt 512 lb (232.242 kg)  LMP IUD  Exam NAD     ASSESMENT AND PLAN    715.97 OSTEOARTHROS NOS-ANKLE  (primary encounter diagnosis)  Comment: chronic issue. Start PT. Change to celebrex.   Plan: CELECOXIB 200 MG OR CAPS, REFERRAL TO PHYSICAL         THERAPY        Discussed that her pain is related to wt . Hopefully her foot pain will resolve with wt loss.     780.52 INSOMNIA NEC  Comment: Plan: TRAZODONE HCL 50 MG OR TABS        Changed by psych. Updated in chart.

## 2010-02-08 NOTE — Progress Notes (Signed)
Why is patient here today? Pt is here for a follow up. Pt needs a discuss medication for arthritis in her feet.     Any new significant medical diagnoses since last visit? NO    Refills? NO    Referral? NO    Letter or form? YES:     Lab results? NO    Health maintenance issues? NO

## 2010-02-08 NOTE — Telephone Encounter (Signed)
Noted.  Closing.

## 2010-02-08 NOTE — Telephone Encounter (Signed)
CONFIRMED PHONE NUMBER:[phone]  CALLERS FIRST AND LAST NAME: Shellee Milo  FACILITY NAME: n/a TITLE: n/a  CALLERS RELATIONSHIP:Self  RETURN CALL: No call back needed    SUBJECT: General Message   REASON FOR REQUEST: per caller    MESSAGE: States that she left information with clinic about losing her cell phone, she was able to locate it so there is no need to keep looking for it.  Thank you.   FORWARD TO: [facility]

## 2010-02-11 ENCOUNTER — Ambulatory Visit (HOSPITAL_BASED_OUTPATIENT_CLINIC_OR_DEPARTMENT_OTHER): Payer: Medicare Other | Attending: Psychiatry | Admitting: Psychiatry

## 2010-02-11 ENCOUNTER — Ambulatory Visit: Payer: Medicare Other | Attending: Surgery | Admitting: Rehabilitative and Restorative Service Providers"

## 2010-02-11 DIAGNOSIS — E669 Obesity, unspecified: Secondary | ICD-10-CM | POA: Insufficient documentation

## 2010-02-11 DIAGNOSIS — G4733 Obstructive sleep apnea (adult) (pediatric): Secondary | ICD-10-CM | POA: Insufficient documentation

## 2010-02-11 DIAGNOSIS — F329 Major depressive disorder, single episode, unspecified: Secondary | ICD-10-CM | POA: Insufficient documentation

## 2010-02-11 DIAGNOSIS — F509 Eating disorder, unspecified: Secondary | ICD-10-CM | POA: Insufficient documentation

## 2010-02-11 DIAGNOSIS — G43909 Migraine, unspecified, not intractable, without status migrainosus: Secondary | ICD-10-CM | POA: Insufficient documentation

## 2010-02-11 DIAGNOSIS — IMO0001 Reserved for inherently not codable concepts without codable children: Secondary | ICD-10-CM | POA: Insufficient documentation

## 2010-02-18 ENCOUNTER — Ambulatory Visit (HOSPITAL_BASED_OUTPATIENT_CLINIC_OR_DEPARTMENT_OTHER): Payer: Medicare Other | Attending: Psychiatry | Admitting: Psychiatry

## 2010-02-18 DIAGNOSIS — G43909 Migraine, unspecified, not intractable, without status migrainosus: Secondary | ICD-10-CM | POA: Insufficient documentation

## 2010-02-18 DIAGNOSIS — F509 Eating disorder, unspecified: Secondary | ICD-10-CM | POA: Insufficient documentation

## 2010-02-18 DIAGNOSIS — E669 Obesity, unspecified: Secondary | ICD-10-CM | POA: Insufficient documentation

## 2010-02-18 DIAGNOSIS — G4733 Obstructive sleep apnea (adult) (pediatric): Secondary | ICD-10-CM | POA: Insufficient documentation

## 2010-02-23 ENCOUNTER — Other Ambulatory Visit (INDEPENDENT_AMBULATORY_CARE_PROVIDER_SITE_OTHER): Payer: Self-pay | Admitting: Wound Care

## 2010-02-23 DIAGNOSIS — I10 Essential (primary) hypertension: Secondary | ICD-10-CM

## 2010-02-23 MED ORDER — LISINOPRIL 5 MG OR TABS
ORAL_TABLET | ORAL | Status: DC
Start: 2010-02-23 — End: 2010-11-04

## 2010-02-23 NOTE — Telephone Encounter (Signed)
Medication Refill Documentation    Name of Medication: Lisinopril    Prescribing provider:  Illene Silver, MD  Protocol: Adult:    BLOOD PRESSURE - Refill if seen in last 6 months for BP. Fill amount up to their next 6 month follow-up appointment and schedule next appointment.  Needs cmp Q77mos, if not UTD, place future order and notify.    BP Readings from Last 1 Encounters:   02/08/10 100/70          Date script was written: 11/30/09, quantity: 90, # refills: 0    Date last seen for this issue:  01/04/10  Date of last OV: 02/08/10  Next scheduled appointment date: Visit date not found    Labs done at [facility] on 01/04/10. Refill #90 x1

## 2010-02-25 ENCOUNTER — Encounter (HOSPITAL_BASED_OUTPATIENT_CLINIC_OR_DEPARTMENT_OTHER): Payer: Medicare Other | Admitting: Psychiatry

## 2010-03-04 ENCOUNTER — Ambulatory Visit (HOSPITAL_BASED_OUTPATIENT_CLINIC_OR_DEPARTMENT_OTHER): Payer: Medicare Other | Attending: Psychiatry | Admitting: Psychiatry

## 2010-03-04 DIAGNOSIS — F509 Eating disorder, unspecified: Secondary | ICD-10-CM | POA: Insufficient documentation

## 2010-03-04 DIAGNOSIS — E669 Obesity, unspecified: Secondary | ICD-10-CM | POA: Insufficient documentation

## 2010-03-04 DIAGNOSIS — G43909 Migraine, unspecified, not intractable, without status migrainosus: Secondary | ICD-10-CM | POA: Insufficient documentation

## 2010-03-04 DIAGNOSIS — G4733 Obstructive sleep apnea (adult) (pediatric): Secondary | ICD-10-CM | POA: Insufficient documentation

## 2010-03-08 ENCOUNTER — Ambulatory Visit (INDEPENDENT_AMBULATORY_CARE_PROVIDER_SITE_OTHER): Payer: Medicare Other | Admitting: Wound Care

## 2010-03-08 VITALS — BP 126/80 | HR 88 | Temp 98.3°F | Resp 16 | Wt >= 6400 oz

## 2010-03-08 DIAGNOSIS — M79609 Pain in unspecified limb: Secondary | ICD-10-CM

## 2010-03-08 MED ORDER — CALCIUM CARBONATE 1250 (500 CA) MG OR TABS
ORAL_TABLET | ORAL | Status: DC
Start: 2010-03-08 — End: 2011-04-03

## 2010-03-08 MED ORDER — POLYETHYLENE GLYCOL 3350 17 GM/SCOOP OR POWD
ORAL | Status: DC
Start: 2010-03-08 — End: 2010-11-04

## 2010-03-08 MED ORDER — METHOCARBAMOL 500 MG OR TABS
ORAL_TABLET | ORAL | Status: AC
Start: 2010-03-08 — End: 2010-03-17

## 2010-03-08 MED ORDER — BISACODYL 5 MG OR TBEC
DELAYED_RELEASE_TABLET | ORAL | Status: DC
Start: 2010-03-08 — End: 2011-03-21

## 2010-03-08 NOTE — Progress Notes (Signed)
Why is patient here today? Pt is here and c/o L leg pain that starts in hip area and goes down to her feet. Pt notes that last Thursday as she has a migraine and went to [facility] ER. Pt would like to discuss a medication for arthritis.     Any new significant medical diagnoses since last visit? NO    Refills? NO    Referral? NO    Letter or form? NO    Lab results? NO    Health maintenance issues? NO

## 2010-03-08 NOTE — Progress Notes (Signed)
Pt is a 34 year old female  here today for follow up  Reviewed history obtained by MA and agree on 03/08/2010    Pt states that she was doing well till the Migraine started last Thursday. Went to ER 2 times.  They gave her im pain medication and compazine. She feels that her migraine is better.  Her left leg started hurting her a day later. From back to lower ankle. Could not walk on it yesterday.  Today it is hurting some but can walk on it. Her stomach pain also flared up. This time it is lower .  This is worse than the other pains. No urine infection s.s . Some constipation.         Patient Active Problem List   Diagnoses   . BENIGN HYPERTENSION   . MIGRAINE NOS INTRACTABLE   . MORBID OBESITY   . CHEST PAIN NOS     Current Outpatient Prescriptions   Medication Sig   . Albuterol 90 MCG/ACT IN AERS 2 puffs inhaled every 4 hours as needed for wheezing and/or cough   . Albuterol 90 MCG/ACT IN AERS two puff as needed    . Aspirin (SB LOW DOSE ASA EC) 81 MG Oral Tab EC 1 TABLET DAILY   . BuPROPion HCl (WELLBUTRIN SR) 200 MG OR TB12 400 mg a day two tablet twice a day    . Celecoxib (CELEBREX) 200 MG Oral Cap Take 1 capsule daily for arthritis   . Cetirizine HCl (ZYRTEC) 10 MG Oral Tab Take 1 tablet every day for allergies   . Cholecalciferol (VITAMIN D) 2000 UNIT OR CAPS one tablet daily    . Citalopram Hydrobromide 40 MG OR TABS once a day    . ClonazePAM 1 MG Oral Tab One tab as needed daily for stress.   . Eletriptan Hydrobromide (RELPAX) 40 MG OR TABS take one tablet by mouth at onset of headache    . Ketoconazole 2 % EX CREA apply to rash area three times a day   . Lisinopril 5 MG Oral Tab Take 1 tablet by mouth every day for HTN. new med   . Omeprazole 20 MG OR TBEC take two capsules by mouth every day    . Prenatal Vit-Fe Fumarate-FA (M-VIT) Oral Tab any daily multivitamin that is covered by insurance - once daily   . Prochlorperazine Maleate 10 MG OR TABS take one tablet by mouth every 6 hours as needed for  nausea/headache   . Propranolol HCl 40 MG Oral Tab 1 TABLET daily   . TraZODone HCl 50 MG Oral Tab per psych - one daily     History   Substance Use Topics   . Smoking status: Never Smoker    . Smokeless tobacco: Not on file   . Alcohol Use: No       review of systems:  constitutional Negative   cardiovascular Negative   respiratory Negative   gastrointestinal  Negative except for constipation. She thinks the medication from ER caused this issue.      O/e  Gen alert    BP 126/80  Pulse 88  Temp(Src) 98.3 F (36.8 C) (Oral)  Resp 16  Wt 512 lb (232.242 kg)  Morbidly obese- losing wt  Neuro limited exan secondary to size.  Standing - ROM fairly normal for her .   Back no tenderness to palpation.  Left leg -obese , no e.o DVT. SLR neg in sitting position.      ASSESMENT  AND PLAN   PAIN IN LIMB  (primary encounter diagnosis)  Comment: seems to be msk though exam is very limited.  Try treatment for constipaiton and prn muscle relaxant.  Discussed use of tylenol for arthritis. She is already on cox-2.   Continue to lose wt. Surgery scheduled end of month for bariatric.   Plan: Bisacodyl 5 MG Oral Tab EC, Calcium Carbonate         (CALCIUM 500) 1250 MG Oral Tab, Polyethylene         Glycol 3350 (MIRALAX) Oral Powder,         Methocarbamol (ROBAXIN) 500 MG Oral Tab          She will contact neurology at [facility] for Follow up on her headaches. We need to prevent ongoing ER visits.  She agrees. Will contact Dr. Roger Shelter.     [name], MD

## 2010-03-13 ENCOUNTER — Encounter (HOSPITAL_BASED_OUTPATIENT_CLINIC_OR_DEPARTMENT_OTHER): Payer: Medicare Other | Admitting: Psychiatry

## 2010-03-13 ENCOUNTER — Telehealth (INDEPENDENT_AMBULATORY_CARE_PROVIDER_SITE_OTHER): Payer: Self-pay | Admitting: Wound Care

## 2010-03-16 ENCOUNTER — Emergency Department
Admission: EM | Admit: 2010-03-16 | Discharge: 2010-03-16 | Disposition: A | Discharge status: Home / Self Care | Payer: Medicare Other | Attending: Emergency Medicine | Admitting: Emergency Medicine

## 2010-03-16 DIAGNOSIS — R109 Unspecified abdominal pain: Secondary | ICD-10-CM | POA: Insufficient documentation

## 2010-03-18 NOTE — Telephone Encounter (Signed)
Pt was seen in ER 9/10.  Do follow up and close TE if she is ok.

## 2010-03-19 NOTE — Telephone Encounter (Signed)
Called pt and she states she is doing fine now. Pt states that stress brought her recent abdominal pain.

## 2010-03-20 ENCOUNTER — Ambulatory Visit (HOSPITAL_BASED_OUTPATIENT_CLINIC_OR_DEPARTMENT_OTHER): Payer: Medicare Other | Attending: Psychiatry | Admitting: Psychiatry

## 2010-03-20 DIAGNOSIS — G43909 Migraine, unspecified, not intractable, without status migrainosus: Secondary | ICD-10-CM | POA: Insufficient documentation

## 2010-03-20 DIAGNOSIS — F509 Eating disorder, unspecified: Secondary | ICD-10-CM | POA: Insufficient documentation

## 2010-03-20 DIAGNOSIS — F329 Major depressive disorder, single episode, unspecified: Secondary | ICD-10-CM | POA: Insufficient documentation

## 2010-03-20 DIAGNOSIS — E669 Obesity, unspecified: Secondary | ICD-10-CM | POA: Insufficient documentation

## 2010-03-20 DIAGNOSIS — G4733 Obstructive sleep apnea (adult) (pediatric): Secondary | ICD-10-CM | POA: Insufficient documentation

## 2010-03-25 ENCOUNTER — Encounter (HOSPITAL_BASED_OUTPATIENT_CLINIC_OR_DEPARTMENT_OTHER): Payer: Medicare Other

## 2010-03-25 ENCOUNTER — Ambulatory Visit: Payer: Medicare Other | Attending: Registered Nurse | Admitting: Registered Nurse

## 2010-03-25 DIAGNOSIS — M1990 Unspecified osteoarthritis, unspecified site: Secondary | ICD-10-CM | POA: Insufficient documentation

## 2010-03-25 DIAGNOSIS — I10 Essential (primary) hypertension: Secondary | ICD-10-CM | POA: Insufficient documentation

## 2010-03-25 DIAGNOSIS — Z6841 Body Mass Index (BMI) 40.0 and over, adult: Secondary | ICD-10-CM | POA: Insufficient documentation

## 2010-03-25 DIAGNOSIS — E559 Vitamin D deficiency, unspecified: Secondary | ICD-10-CM | POA: Insufficient documentation

## 2010-03-25 DIAGNOSIS — N393 Stress incontinence (female) (male): Secondary | ICD-10-CM | POA: Insufficient documentation

## 2010-03-25 DIAGNOSIS — K219 Gastro-esophageal reflux disease without esophagitis: Secondary | ICD-10-CM | POA: Insufficient documentation

## 2010-03-25 DIAGNOSIS — G4733 Obstructive sleep apnea (adult) (pediatric): Secondary | ICD-10-CM | POA: Insufficient documentation

## 2010-03-25 DIAGNOSIS — E785 Hyperlipidemia, unspecified: Secondary | ICD-10-CM | POA: Insufficient documentation

## 2010-03-27 ENCOUNTER — Encounter (HOSPITAL_BASED_OUTPATIENT_CLINIC_OR_DEPARTMENT_OTHER): Payer: Medicare Other | Admitting: Psychiatry

## 2010-04-01 ENCOUNTER — Ambulatory Visit (HOSPITAL_BASED_OUTPATIENT_CLINIC_OR_DEPARTMENT_OTHER): Payer: Medicare Other | Attending: Psychiatry | Admitting: Psychiatry

## 2010-04-01 DIAGNOSIS — E669 Obesity, unspecified: Secondary | ICD-10-CM | POA: Insufficient documentation

## 2010-04-01 DIAGNOSIS — G43909 Migraine, unspecified, not intractable, without status migrainosus: Secondary | ICD-10-CM | POA: Insufficient documentation

## 2010-04-01 DIAGNOSIS — G4733 Obstructive sleep apnea (adult) (pediatric): Secondary | ICD-10-CM | POA: Insufficient documentation

## 2010-04-01 DIAGNOSIS — F325 Major depressive disorder, single episode, in full remission: Secondary | ICD-10-CM | POA: Insufficient documentation

## 2010-04-01 DIAGNOSIS — F329 Major depressive disorder, single episode, unspecified: Secondary | ICD-10-CM | POA: Insufficient documentation

## 2010-04-01 DIAGNOSIS — F509 Eating disorder, unspecified: Secondary | ICD-10-CM | POA: Insufficient documentation

## 2010-04-08 ENCOUNTER — Observation Stay
Admission: EM | Admit: 2010-04-08 | Discharge: 2010-04-09 | DRG: 313 | Disposition: A | Discharge status: Home / Self Care | Payer: Medicare Other | Attending: Cardiovascular Disease | Admitting: Cardiovascular Disease

## 2010-04-08 ENCOUNTER — Inpatient Hospital Stay (HOSPITAL_BASED_OUTPATIENT_CLINIC_OR_DEPARTMENT_OTHER): Payer: Medicare Other | Admitting: Cardiovascular Disease

## 2010-04-08 ENCOUNTER — Telehealth (INDEPENDENT_AMBULATORY_CARE_PROVIDER_SITE_OTHER): Payer: Self-pay | Admitting: Wound Care

## 2010-04-08 ENCOUNTER — Other Ambulatory Visit (HOSPITAL_BASED_OUTPATIENT_CLINIC_OR_DEPARTMENT_OTHER): Payer: Self-pay | Admitting: Emergency Medicine

## 2010-04-08 DIAGNOSIS — I2510 Atherosclerotic heart disease of native coronary artery without angina pectoris: Secondary | ICD-10-CM | POA: Diagnosis present

## 2010-04-08 DIAGNOSIS — F3289 Other specified depressive episodes: Secondary | ICD-10-CM | POA: Diagnosis present

## 2010-04-08 DIAGNOSIS — K219 Gastro-esophageal reflux disease without esophagitis: Secondary | ICD-10-CM | POA: Diagnosis present

## 2010-04-08 DIAGNOSIS — F509 Eating disorder, unspecified: Secondary | ICD-10-CM | POA: Diagnosis present

## 2010-04-08 DIAGNOSIS — Z880 Allergy status to penicillin: Secondary | ICD-10-CM

## 2010-04-08 DIAGNOSIS — E781 Pure hyperglyceridemia: Secondary | ICD-10-CM | POA: Diagnosis present

## 2010-04-08 DIAGNOSIS — Z881 Allergy status to other antibiotic agents status: Secondary | ICD-10-CM

## 2010-04-08 DIAGNOSIS — R61 Generalized hyperhidrosis: Secondary | ICD-10-CM | POA: Diagnosis present

## 2010-04-08 DIAGNOSIS — G43909 Migraine, unspecified, not intractable, without status migrainosus: Secondary | ICD-10-CM | POA: Diagnosis present

## 2010-04-08 DIAGNOSIS — R0602 Shortness of breath: Secondary | ICD-10-CM

## 2010-04-08 DIAGNOSIS — Z6841 Body Mass Index (BMI) 40.0 and over, adult: Secondary | ICD-10-CM

## 2010-04-08 DIAGNOSIS — G4733 Obstructive sleep apnea (adult) (pediatric): Secondary | ICD-10-CM | POA: Diagnosis present

## 2010-04-08 DIAGNOSIS — Z8614 Personal history of Methicillin resistant Staphylococcus aureus infection: Secondary | ICD-10-CM

## 2010-04-08 DIAGNOSIS — I10 Essential (primary) hypertension: Secondary | ICD-10-CM

## 2010-04-08 DIAGNOSIS — J811 Chronic pulmonary edema: Secondary | ICD-10-CM

## 2010-04-08 DIAGNOSIS — R0789 Other chest pain: Principal | ICD-10-CM | POA: Diagnosis present

## 2010-04-08 DIAGNOSIS — R079 Chest pain, unspecified: Secondary | ICD-10-CM

## 2010-04-08 DIAGNOSIS — R42 Dizziness and giddiness: Secondary | ICD-10-CM | POA: Diagnosis present

## 2010-04-08 DIAGNOSIS — M25519 Pain in unspecified shoulder: Secondary | ICD-10-CM | POA: Diagnosis present

## 2010-04-08 DIAGNOSIS — E559 Vitamin D deficiency, unspecified: Secondary | ICD-10-CM | POA: Diagnosis present

## 2010-04-08 DIAGNOSIS — E785 Hyperlipidemia, unspecified: Secondary | ICD-10-CM

## 2010-04-08 LAB — CHEST X-RAY 1 VW

## 2010-04-09 ENCOUNTER — Ambulatory Visit (HOSPITAL_COMMUNITY): Payer: Medicare Other

## 2010-04-09 DIAGNOSIS — R0602 Shortness of breath: Secondary | ICD-10-CM

## 2010-04-09 DIAGNOSIS — R079 Chest pain, unspecified: Secondary | ICD-10-CM

## 2010-04-09 NOTE — Telephone Encounter (Signed)
The patient is inpatient at [facility], routing to tomorrow to check status, see [facility]

## 2010-04-10 NOTE — Telephone Encounter (Addendum)
Care Coordination/hospital follow up:    Pt states she already has a follow up appt. For Monday October 10th.  From ER. visit and overnight stay.  Pt states she is feeling much better.  Was inpatient @ [facility].  States she wasn't started on any new medications.  Pt has no further questions at this time.

## 2010-04-15 ENCOUNTER — Ambulatory Visit (INDEPENDENT_AMBULATORY_CARE_PROVIDER_SITE_OTHER): Payer: Medicare Other | Admitting: Medical

## 2010-04-15 ENCOUNTER — Ambulatory Visit (HOSPITAL_BASED_OUTPATIENT_CLINIC_OR_DEPARTMENT_OTHER): Payer: Medicare Other | Attending: Psychiatry | Admitting: Psychiatry

## 2010-04-15 VITALS — BP 138/84 | HR 92 | Temp 97.9°F

## 2010-04-15 DIAGNOSIS — R079 Chest pain, unspecified: Secondary | ICD-10-CM

## 2010-04-15 DIAGNOSIS — F329 Major depressive disorder, single episode, unspecified: Secondary | ICD-10-CM | POA: Insufficient documentation

## 2010-04-15 DIAGNOSIS — G43909 Migraine, unspecified, not intractable, without status migrainosus: Secondary | ICD-10-CM | POA: Insufficient documentation

## 2010-04-15 DIAGNOSIS — G4733 Obstructive sleep apnea (adult) (pediatric): Secondary | ICD-10-CM | POA: Insufficient documentation

## 2010-04-15 DIAGNOSIS — E669 Obesity, unspecified: Secondary | ICD-10-CM | POA: Insufficient documentation

## 2010-04-15 DIAGNOSIS — J069 Acute upper respiratory infection, unspecified: Secondary | ICD-10-CM

## 2010-04-15 DIAGNOSIS — F509 Eating disorder, unspecified: Secondary | ICD-10-CM | POA: Insufficient documentation

## 2010-04-15 NOTE — Progress Notes (Signed)
Why is patient here today? Patient is here today for a FU ER Chest Pns.    Any new significant medical diagnoses since last visit? NO    Refills? NO    Referral? NO    Letter or form? NO    Lab results? NO    Health maintenance issues? NO

## 2010-04-15 NOTE — Progress Notes (Signed)
12:42 PM  Sharon Stephens  is a 34 year old female  with the following concerns:    Admitted to the [facility] 7 days ago for chest pain. Admitted for one night. Stress test and work up revealed low likelihood  for MI. States she was informed her heart is enlarged. Has follow up with cardiologist on 11/2.     Gastric Bypass Surgery scheduled for 12/2.     No chest pain since then    Has a cold from son. Takes Zyrtec for allergies.     ROS:  No fever, no chills   + congestion   Cardiovascular: Denies chest pain, palpitations  Respiratory: Denies cough  Allergic/Immunologic: + season rhinitis, no asthma        Current Outpatient Prescriptions   Medication Sig   . Albuterol 90 MCG/ACT IN AERS 2 puffs inhaled every 4 hours as needed for wheezing and/or cough   . Albuterol 90 MCG/ACT IN AERS two puff as needed    . Aspirin (SB LOW DOSE ASA EC) 81 MG Oral Tab EC 1 TABLET DAILY   . Bisacodyl 5 MG Oral Tab EC one tablet daily for constipation   . BuPROPion HCl (WELLBUTRIN SR) 200 MG OR TB12 400 mg a day two tablet twice a day    . Calcium Carbonate (CALCIUM 500) 1250 MG Oral Tab one tablet three times daily   . Celecoxib (CELEBREX) 200 MG Oral Cap Take 1 capsule daily for arthritis   . Cetirizine HCl (ZYRTEC) 10 MG Oral Tab Take 1 tablet every day for allergies   . Cholecalciferol (VITAMIN D) 2000 UNIT OR CAPS one tablet daily    . Citalopram Hydrobromide 40 MG OR TABS once a day    . ClonazePAM 1 MG Oral Tab One tab as needed daily for stress.   . Eletriptan Hydrobromide (RELPAX) 40 MG OR TABS take one tablet by mouth at onset of headache    . Ketoconazole 2 % EX CREA apply to rash area three times a day   . Lisinopril 5 MG Oral Tab Take 1 tablet by mouth every day for HTN. new med   . Omeprazole 20 MG OR TBEC take two capsules by mouth every day    . Polyethylene Glycol 3350 (MIRALAX) Oral Powder one dose daily as needed for constipation   . Prenatal Vit-Fe Fumarate-FA (M-VIT) Oral Tab any daily multivitamin that is  covered by insurance - once daily   . Prochlorperazine Maleate 10 MG OR TABS take one tablet by mouth every 6 hours as needed for nausea/headache   . Propranolol HCl 40 MG Oral Tab 1 TABLET daily   . TraZODone HCl 50 MG Oral Tab per psych - one daily     Patient Active Problem List   Diagnoses   . BENIGN HYPERTENSION   . MIGRAINE NOS INTRACTABLE   . MORBID OBESITY   . CHEST PAIN NOS     Review of patient's allergies indicates:  Allergies   Allergen Reactions   . Amoxicillin      Unsure from childhood    . Penicillins      Unsure from childhood      .OBJECTIVE:  BP 138/84  Pulse 92  Temp(Src) 97.9 F (36.6 C) (Oral)  GENERAL: morbidly obese woman no acute distress.  Alert and oriented x3.    Resp: congested   PULM:  Distant lung sounds due to size    CV:  RRR.  Nl S1 and  S2 without murmurs rubs or gallops.       CHEST PAIN NOS  (primary encounter diagnosis)  Comment: has resolved. Reviewed [facility] chart of recent events.   Plan: follow up with cardiologist.      ACUTE URI NOS  Comment: Discussed likely viral origin   Plan: Encouraged supportive care     Jerene Bears, PA-C

## 2010-04-21 ENCOUNTER — Emergency Department (HOSPITAL_BASED_OUTPATIENT_CLINIC_OR_DEPARTMENT_OTHER)
Admission: EM | Admit: 2010-04-21 | Discharge: 2010-04-21 | Disposition: A | Discharge status: Home / Self Care | Payer: Medicare Other | Attending: Psychiatry | Admitting: Psychiatry

## 2010-04-21 DIAGNOSIS — I10 Essential (primary) hypertension: Secondary | ICD-10-CM

## 2010-04-21 DIAGNOSIS — F489 Nonpsychotic mental disorder, unspecified: Secondary | ICD-10-CM | POA: Insufficient documentation

## 2010-04-21 DIAGNOSIS — F329 Major depressive disorder, single episode, unspecified: Secondary | ICD-10-CM

## 2010-04-22 ENCOUNTER — Ambulatory Visit (HOSPITAL_BASED_OUTPATIENT_CLINIC_OR_DEPARTMENT_OTHER): Payer: Medicare Other | Attending: Psychiatry | Admitting: Psychiatry

## 2010-04-22 DIAGNOSIS — G4733 Obstructive sleep apnea (adult) (pediatric): Secondary | ICD-10-CM | POA: Insufficient documentation

## 2010-04-22 DIAGNOSIS — E669 Obesity, unspecified: Secondary | ICD-10-CM | POA: Insufficient documentation

## 2010-04-22 DIAGNOSIS — F509 Eating disorder, unspecified: Secondary | ICD-10-CM | POA: Insufficient documentation

## 2010-04-22 DIAGNOSIS — G43909 Migraine, unspecified, not intractable, without status migrainosus: Secondary | ICD-10-CM | POA: Insufficient documentation

## 2010-04-29 ENCOUNTER — Ambulatory Visit (HOSPITAL_BASED_OUTPATIENT_CLINIC_OR_DEPARTMENT_OTHER): Payer: Medicare Other | Attending: Psychiatry | Admitting: Psychiatry

## 2010-04-29 DIAGNOSIS — F509 Eating disorder, unspecified: Secondary | ICD-10-CM | POA: Insufficient documentation

## 2010-04-29 DIAGNOSIS — F325 Major depressive disorder, single episode, in full remission: Secondary | ICD-10-CM | POA: Insufficient documentation

## 2010-04-29 DIAGNOSIS — G43909 Migraine, unspecified, not intractable, without status migrainosus: Secondary | ICD-10-CM | POA: Insufficient documentation

## 2010-04-29 DIAGNOSIS — G4733 Obstructive sleep apnea (adult) (pediatric): Secondary | ICD-10-CM | POA: Insufficient documentation

## 2010-04-29 DIAGNOSIS — E669 Obesity, unspecified: Secondary | ICD-10-CM | POA: Insufficient documentation

## 2010-05-02 ENCOUNTER — Encounter (HOSPITAL_BASED_OUTPATIENT_CLINIC_OR_DEPARTMENT_OTHER): Payer: Medicare Other | Admitting: Family

## 2010-05-06 ENCOUNTER — Ambulatory Visit (HOSPITAL_BASED_OUTPATIENT_CLINIC_OR_DEPARTMENT_OTHER): Payer: Medicare Other | Attending: Psychiatry | Admitting: Psychiatry

## 2010-05-06 DIAGNOSIS — F509 Eating disorder, unspecified: Secondary | ICD-10-CM | POA: Insufficient documentation

## 2010-05-06 DIAGNOSIS — E669 Obesity, unspecified: Secondary | ICD-10-CM | POA: Insufficient documentation

## 2010-05-06 DIAGNOSIS — G43909 Migraine, unspecified, not intractable, without status migrainosus: Secondary | ICD-10-CM | POA: Insufficient documentation

## 2010-05-06 DIAGNOSIS — G4733 Obstructive sleep apnea (adult) (pediatric): Secondary | ICD-10-CM | POA: Insufficient documentation

## 2010-05-08 ENCOUNTER — Ambulatory Visit: Payer: Medicare Other | Attending: Cardiovascular Disease | Admitting: Cardiovascular Disease

## 2010-05-08 DIAGNOSIS — R0789 Other chest pain: Secondary | ICD-10-CM | POA: Insufficient documentation

## 2010-05-08 DIAGNOSIS — I10 Essential (primary) hypertension: Secondary | ICD-10-CM | POA: Insufficient documentation

## 2010-05-10 ENCOUNTER — Ambulatory Visit (INDEPENDENT_AMBULATORY_CARE_PROVIDER_SITE_OTHER): Payer: Medicare Other | Admitting: Medical

## 2010-05-10 ENCOUNTER — Encounter (INDEPENDENT_AMBULATORY_CARE_PROVIDER_SITE_OTHER): Payer: Self-pay | Admitting: Medical

## 2010-05-10 VITALS — BP 120/60 | HR 88 | Temp 97.9°F

## 2010-05-10 DIAGNOSIS — J069 Acute upper respiratory infection, unspecified: Secondary | ICD-10-CM

## 2010-05-10 NOTE — Progress Notes (Signed)
4:43 PM  Sharon Stephens  is a 34 year old female  with the following concerns:    3 days a with cough. Son recently had cold symptoms. As surgery scheduled for 12/5.     ROS:  Constitutional: Denies fever  Ears:  Denies ear pain  Nose:  Denies rhinorrhea  Throat:  + sore throat  Neurological: Denies headaches    Current Outpatient Prescriptions   Medication Sig   . Albuterol 90 MCG/ACT IN AERS 2 puffs inhaled every 4 hours as needed for wheezing and/or cough   . Albuterol 90 MCG/ACT IN AERS two puff as needed    . Aspirin (SB LOW DOSE ASA EC) 81 MG Oral Tab EC 1 TABLET DAILY   . Bisacodyl 5 MG Oral Tab EC one tablet daily for constipation   . BuPROPion HCl (WELLBUTRIN SR) 200 MG OR TB12 400 mg a day two tablet twice a day    . Calcium Carbonate (CALCIUM 500) 1250 MG Oral Tab one tablet three times daily   . Celecoxib (CELEBREX) 200 MG Oral Cap Take 1 capsule daily for arthritis   . Cetirizine HCl (ZYRTEC) 10 MG Oral Tab Take 1 tablet every day for allergies   . Cholecalciferol (VITAMIN D) 2000 UNIT OR CAPS one tablet daily    . Citalopram Hydrobromide 40 MG OR TABS once a day    . ClonazePAM 1 MG Oral Tab One tab as needed daily for stress.   . Eletriptan Hydrobromide (RELPAX) 40 MG OR TABS take one tablet by mouth at onset of headache    . Ketoconazole 2 % EX CREA apply to rash area three times a day   . Lisinopril 5 MG Oral Tab Take 1 tablet by mouth every day for HTN. new med   . Omeprazole 20 MG OR TBEC take two capsules by mouth every day    . Polyethylene Glycol 3350 (MIRALAX) Oral Powder one dose daily as needed for constipation   . Prenatal Vit-Fe Fumarate-FA (M-VIT) Oral Tab any daily multivitamin that is covered by insurance - once daily   . Prochlorperazine Maleate 10 MG OR TABS take one tablet by mouth every 6 hours as needed for nausea/headache   . Propranolol HCl 40 MG Oral Tab 1 TABLET daily   . TraZODone HCl 50 MG Oral Tab per psych - one daily     Review of patient's allergies  indicates:  Allergies   Allergen Reactions   . Amoxicillin      Unsure from childhood    . Penicillins      Unsure from childhood      Patient Active Problem List   Diagnoses   . BENIGN HYPERTENSION   . MIGRAINE NOS INTRACTABLE   . MORBID OBESITY   . CHEST PAIN NOS     OBJECTIVE:  BP 120/60  Pulse 88  Temp(Src) 97.9 F (36.6 C) (Temporal)  SpO2 96%  GENERAL: obese woman in no acute distress.  Alert and oriented x3.    HYDRATION:  Moist mucous membranes  EYES:   No conjuctival erythema or discharge.    EARS:  Canals patent bilaterally.  TMs gray with visible bony landmarks   NOSE:    No rhinorrhea.    PHARYNX:  Pharynx pink.  No tonsillar hypertrophy or exudate.  Uvula midline.    NECK:  Supple, nontender.  No lymphadenopathy.   PULM:  Clear to auscultation bilaterally.  No wheezes, rales, or rhonchi.    CV:  RRR.  Nl S1 and S2 without murmurs rubs or gallops.       ACUTE URI NOS  (primary encounter diagnosis)  Normal exam   Plan: Encouraged supportive care     Return if symptoms do not improve.     Jerene Bears, PA-C

## 2010-05-13 ENCOUNTER — Encounter (HOSPITAL_BASED_OUTPATIENT_CLINIC_OR_DEPARTMENT_OTHER): Payer: Medicare Other | Admitting: Family

## 2010-05-20 ENCOUNTER — Ambulatory Visit (HOSPITAL_BASED_OUTPATIENT_CLINIC_OR_DEPARTMENT_OTHER): Payer: Medicare Other | Attending: Psychiatry | Admitting: Psychiatry

## 2010-05-20 DIAGNOSIS — G4733 Obstructive sleep apnea (adult) (pediatric): Secondary | ICD-10-CM | POA: Insufficient documentation

## 2010-05-20 DIAGNOSIS — E669 Obesity, unspecified: Secondary | ICD-10-CM | POA: Insufficient documentation

## 2010-05-20 DIAGNOSIS — F329 Major depressive disorder, single episode, unspecified: Secondary | ICD-10-CM | POA: Insufficient documentation

## 2010-05-20 DIAGNOSIS — G43909 Migraine, unspecified, not intractable, without status migrainosus: Secondary | ICD-10-CM | POA: Insufficient documentation

## 2010-05-20 DIAGNOSIS — F509 Eating disorder, unspecified: Secondary | ICD-10-CM | POA: Insufficient documentation

## 2010-05-24 ENCOUNTER — Telehealth (INDEPENDENT_AMBULATORY_CARE_PROVIDER_SITE_OTHER): Payer: Self-pay | Admitting: Wound Care

## 2010-05-24 NOTE — Telephone Encounter (Signed)
CONFIRMED PHONE NUMBER: [phone]  CALLERS FIRST AND LAST NAME: Pershing Proud  FACILITY NAME: n/a TITLE: n/a  CALLERS RELATIONSHIP:Self  RETURN CALL: Detailed message on voicemail only    SUBJECT: General Message   REASON FOR REQUEST: Patient went to [facility]  ER. ER gave patient dysepin(check spelling).    MESSAGE: Patient would like the same medication that was given at [facility] ER.  FORWARD TO: Clinical staff pool

## 2010-05-24 NOTE — Telephone Encounter (Signed)
Spoke with patient and it is Fluconazole(yeast infection)--150 mg  One tablet by mouth.  So patient is needing two by mouth(because of her weight).  States Dr. Ennis Forts has prescribed before.  She realizes that this may not happen because this was prescribed in ED.  Thank you.

## 2010-05-24 NOTE — Telephone Encounter (Signed)
Need to know what medication, dose, sig, etc.  Thank you.

## 2010-05-24 NOTE — Telephone Encounter (Signed)
CCR could not obtain info requested below from Pt. Please try Pt again at number attached. Pt would like a call today. She says she will not be available on Monday.

## 2010-05-27 ENCOUNTER — Ambulatory Visit (HOSPITAL_BASED_OUTPATIENT_CLINIC_OR_DEPARTMENT_OTHER): Payer: Medicare Other | Attending: Psychiatry | Admitting: Psychiatry

## 2010-05-27 DIAGNOSIS — G43909 Migraine, unspecified, not intractable, without status migrainosus: Secondary | ICD-10-CM | POA: Insufficient documentation

## 2010-05-27 DIAGNOSIS — F329 Major depressive disorder, single episode, unspecified: Secondary | ICD-10-CM | POA: Insufficient documentation

## 2010-05-27 DIAGNOSIS — F509 Eating disorder, unspecified: Secondary | ICD-10-CM | POA: Insufficient documentation

## 2010-05-27 DIAGNOSIS — G4733 Obstructive sleep apnea (adult) (pediatric): Secondary | ICD-10-CM | POA: Insufficient documentation

## 2010-05-27 DIAGNOSIS — E669 Obesity, unspecified: Secondary | ICD-10-CM | POA: Insufficient documentation

## 2010-05-28 ENCOUNTER — Ambulatory Visit (INDEPENDENT_AMBULATORY_CARE_PROVIDER_SITE_OTHER): Payer: Medicare Other | Admitting: Medical

## 2010-05-28 VITALS — HR 100 | Temp 97.8°F | Wt >= 6400 oz

## 2010-05-28 DIAGNOSIS — B3731 Acute candidiasis of vulva and vagina: Secondary | ICD-10-CM

## 2010-05-28 MED ORDER — FLUCONAZOLE 150 MG OR TABS
ORAL_TABLET | ORAL | Status: AC
Start: 2010-05-28 — End: 2010-05-29

## 2010-05-28 NOTE — Progress Notes (Signed)
10:25 AM  Sharon Stephens       Walking more and cut calories in half,. Has lost 62 pounds since May. Has meeting with anesthesiologist tomorrow for gastric   Bypass scheduled on 12/2. The last time she was at this weight was at least 5 years ago.     Was in the hospital 6 days ago for what she thought was a bladder infection. She was diagnosed with a yeast infections and treated with diflucan. States her symptoms are somewhat better but still has some vaginal burning. States due to her weight she typically needs two doses of diflucan to treat a yeast infection.       Current Outpatient Prescriptions   Medication Sig   . Albuterol 90 MCG/ACT IN AERS 2 puffs inhaled every 4 hours as needed for wheezing and/or cough   . AmLODIPine Besylate (NORVASC) 5 MG Oral Tab 1 daily for high blood pressure   . Atorvastatin Calcium (LIPITOR) 20 MG Oral Tab 1 at bedtime to lower cholesterol   (2ND FAX, PLEASE FILL ASAP)   . BuPROPion HCl (WELLBUTRIN XL) 300 MG Oral TABLET SR 24 HR 1 daily for depression   . CENTRUM SILVER OR TABS 1 TABLET DAILY   . ESTRACE 0.1 MG/GM VA CREA instill in vagina 1/2 applicator 3 nights/week    . Fluticasone Propionate  HFA (FLOVENT HFA) 220 MCG/ACT IN AERO inhale 2 puffs twice daily regardless of symptoms   . Oxybutynin 3.9 MG/24HR Transdermal PATCH BIWEEKLY apply 1 patch 2 days a week   . Solifenacin Succinate (VESICARE) 5 MG Oral Tab one daily     Review of patient's allergies indicates:  Allergies   Allergen Reactions   . Codiene [Other]      vomits     . Darvon [Other]      loopy     . Hydrocodone Nausea/Vomiting     Vicotuss syrup induced nausea/vomitting   . Meperidine And Related      hives     . Talwin [Other]      nausea     . [place] [Other] Shortness of Breath     Patient Active Problem List   Diagnoses   . ENDOMETRIOSIS NOS   . CVA   . CHRONIC DEPRESSIVE PERSON   . VENTRICULAR SHUNT STATUS   . CA IN SITU CERVIX UTERI   . URINARY FREQUENCY   . BENIGN HYPERTENSION   . 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 Regurgiation   . BURSITIS: greater trochanter   . UNSP ASTHMA W/O STATUS ASTHMATICUS   . CNS DISORDER NEC   . HYPERLIPIDEMIA NEC/NOS   . ALCOHOL ABUSE-EPISODIC   . 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 UNDIAGNOSED CARDIAC MURMURS     OBJECTIVE:  Pulse 100  Temp(Src) 97.8 F (36.6 C) (Tympanic)  Wt 485 lb (219.995 kg)  SpO2 95%    GENERAL: obese woman in no acute distress.  Alert and oriented x3.      Exam deferred     CANDIDAL VULVOVAGINITIS  (primary encounter diagnosis)    Plan: diflucan for residual symptoms. Return is symptoms persists      MORBID OBESITY  Comment: praised on weight loss.     Jerene Bears, PA-C

## 2010-05-28 NOTE — Progress Notes (Signed)
Why is patient here today? Patient is c/o ongoing yeast infection x 3 days. Was seen at the ER and given Diflucan. It only temporarily cleared.    Any new significant medical diagnoses since last visit? NO    Refills? NO    Referral? NO    Letter or form? NO    Lab results? NO    Health maintenance issues? Pap and Flu shot

## 2010-05-29 ENCOUNTER — Telehealth (INDEPENDENT_AMBULATORY_CARE_PROVIDER_SITE_OTHER): Payer: Self-pay | Admitting: Wound Care

## 2010-05-29 ENCOUNTER — Ambulatory Visit: Payer: Medicare Other

## 2010-05-29 DIAGNOSIS — B3731 Acute candidiasis of vulva and vagina: Secondary | ICD-10-CM

## 2010-05-29 MED ORDER — FLUCONAZOLE 150 MG OR TABS
ORAL_TABLET | ORAL | Status: AC
Start: 2010-05-29 — End: 2010-05-30

## 2010-05-29 NOTE — Telephone Encounter (Signed)
Refill sent. Let her know she will need to be seen in symptoms persist

## 2010-05-29 NOTE — Telephone Encounter (Signed)
Patient states states her yeast infection has not cleared up since being seen on 11/22. She is requesting a refill on Diflucan to be called in to [facility] at [phone] as patient will be out of town until Friday.    Does she need to be seen?

## 2010-05-29 NOTE — Telephone Encounter (Signed)
Patient is requesting that prescription be called in asap as she will out of town until Friday. Please call patient at [phone].

## 2010-05-29 NOTE — Telephone Encounter (Signed)
CONFIRMED PHONE NUMBER: [phone]  CALLERS FIRST AND LAST NAME: Shellee Milo  FACILITY NAME: n/a TITLE: n/a  CALLERS RELATIONSHIP:Self  RETURN CALL: No call back needed    SUBJECT: Prescription Management   REASON FOR REQUEST: Yeast infection hasn't cleared up; patient is needing more medication.    MEDICATION: Fluconazole  CONCERN/QUESTION: Yeast infection hasn't cleared up.  PRESCRIBING PROVIDER: Dr. Caesar Chestnut  DOSE TAKING NOW: 150 mg.  PHARMACY NAME, LOCATION, & PHONE #: [facility], [phone]

## 2010-05-29 NOTE — Telephone Encounter (Signed)
Informed patient that the prescription has been called in to the [facility] pharmacy and if symptoms persist, to make an appointment.

## 2010-06-03 ENCOUNTER — Ambulatory Visit (HOSPITAL_BASED_OUTPATIENT_CLINIC_OR_DEPARTMENT_OTHER): Payer: Medicare Other | Attending: Psychiatry | Admitting: Psychiatry

## 2010-06-03 DIAGNOSIS — F329 Major depressive disorder, single episode, unspecified: Secondary | ICD-10-CM | POA: Insufficient documentation

## 2010-06-03 DIAGNOSIS — F509 Eating disorder, unspecified: Secondary | ICD-10-CM | POA: Insufficient documentation

## 2010-06-03 DIAGNOSIS — G4733 Obstructive sleep apnea (adult) (pediatric): Secondary | ICD-10-CM | POA: Insufficient documentation

## 2010-06-03 DIAGNOSIS — E669 Obesity, unspecified: Secondary | ICD-10-CM | POA: Insufficient documentation

## 2010-06-03 DIAGNOSIS — G43909 Migraine, unspecified, not intractable, without status migrainosus: Secondary | ICD-10-CM | POA: Insufficient documentation

## 2010-06-05 ENCOUNTER — Encounter (HOSPITAL_BASED_OUTPATIENT_CLINIC_OR_DEPARTMENT_OTHER): Payer: Medicare Other | Admitting: Family

## 2010-06-07 ENCOUNTER — Inpatient Hospital Stay
Admission: RE | Admit: 2010-06-07 | Discharge: 2010-06-13 | DRG: 621 | Disposition: A | Discharge status: Skilled Nursing Facility | Payer: Medicare Other | Attending: Surgery | Admitting: Surgery

## 2010-06-07 ENCOUNTER — Inpatient Hospital Stay (HOSPITAL_COMMUNITY): Payer: Medicare Other | Admitting: Surgery

## 2010-06-07 DIAGNOSIS — G4733 Obstructive sleep apnea (adult) (pediatric): Secondary | ICD-10-CM

## 2010-06-07 DIAGNOSIS — Z8249 Family history of ischemic heart disease and other diseases of the circulatory system: Secondary | ICD-10-CM

## 2010-06-07 DIAGNOSIS — G43909 Migraine, unspecified, not intractable, without status migrainosus: Secondary | ICD-10-CM | POA: Diagnosis present

## 2010-06-07 DIAGNOSIS — K219 Gastro-esophageal reflux disease without esophagitis: Secondary | ICD-10-CM | POA: Diagnosis present

## 2010-06-07 DIAGNOSIS — Z881 Allergy status to other antibiotic agents status: Secondary | ICD-10-CM

## 2010-06-07 DIAGNOSIS — R0681 Apnea, not elsewhere classified: Secondary | ICD-10-CM | POA: Diagnosis present

## 2010-06-07 DIAGNOSIS — Z880 Allergy status to penicillin: Secondary | ICD-10-CM

## 2010-06-07 DIAGNOSIS — A4902 Methicillin resistant Staphylococcus aureus infection, unspecified site: Secondary | ICD-10-CM | POA: Diagnosis present

## 2010-06-07 DIAGNOSIS — M1990 Unspecified osteoarthritis, unspecified site: Secondary | ICD-10-CM | POA: Diagnosis present

## 2010-06-07 DIAGNOSIS — Z713 Dietary counseling and surveillance: Secondary | ICD-10-CM

## 2010-06-07 DIAGNOSIS — Z6841 Body Mass Index (BMI) 40.0 and over, adult: Secondary | ICD-10-CM

## 2010-06-07 DIAGNOSIS — I10 Essential (primary) hypertension: Secondary | ICD-10-CM

## 2010-06-07 DIAGNOSIS — Z9109 Other allergy status, other than to drugs and biological substances: Secondary | ICD-10-CM

## 2010-06-07 DIAGNOSIS — E119 Type 2 diabetes mellitus without complications: Secondary | ICD-10-CM | POA: Diagnosis present

## 2010-06-07 DIAGNOSIS — Z87891 Personal history of nicotine dependence: Secondary | ICD-10-CM

## 2010-06-07 DIAGNOSIS — G8918 Other acute postprocedural pain: Secondary | ICD-10-CM

## 2010-06-07 DIAGNOSIS — R109 Unspecified abdominal pain: Secondary | ICD-10-CM | POA: Diagnosis present

## 2010-06-07 DIAGNOSIS — N393 Stress incontinence (female) (male): Secondary | ICD-10-CM | POA: Diagnosis present

## 2010-06-07 DIAGNOSIS — R404 Transient alteration of awareness: Secondary | ICD-10-CM | POA: Diagnosis present

## 2010-06-07 DIAGNOSIS — F3289 Other specified depressive episodes: Secondary | ICD-10-CM | POA: Diagnosis present

## 2010-06-08 DIAGNOSIS — D72829 Elevated white blood cell count, unspecified: Secondary | ICD-10-CM

## 2010-06-10 ENCOUNTER — Encounter (HOSPITAL_BASED_OUTPATIENT_CLINIC_OR_DEPARTMENT_OTHER): Payer: Medicare Other | Admitting: Psychiatry

## 2010-06-17 ENCOUNTER — Ambulatory Visit (HOSPITAL_BASED_OUTPATIENT_CLINIC_OR_DEPARTMENT_OTHER): Payer: Medicare Other | Attending: Psychiatry | Admitting: Psychiatry

## 2010-06-17 DIAGNOSIS — G43909 Migraine, unspecified, not intractable, without status migrainosus: Secondary | ICD-10-CM | POA: Insufficient documentation

## 2010-06-17 DIAGNOSIS — G4733 Obstructive sleep apnea (adult) (pediatric): Secondary | ICD-10-CM | POA: Insufficient documentation

## 2010-06-17 DIAGNOSIS — E669 Obesity, unspecified: Secondary | ICD-10-CM | POA: Insufficient documentation

## 2010-06-17 DIAGNOSIS — F509 Eating disorder, unspecified: Secondary | ICD-10-CM | POA: Insufficient documentation

## 2010-06-18 ENCOUNTER — Ambulatory Visit: Payer: Medicare Other | Attending: Family Medicine | Admitting: Family Medicine

## 2010-06-18 DIAGNOSIS — F3289 Other specified depressive episodes: Secondary | ICD-10-CM | POA: Insufficient documentation

## 2010-06-18 DIAGNOSIS — I10 Essential (primary) hypertension: Secondary | ICD-10-CM | POA: Insufficient documentation

## 2010-06-24 ENCOUNTER — Encounter (HOSPITAL_BASED_OUTPATIENT_CLINIC_OR_DEPARTMENT_OTHER): Payer: Medicare Other | Admitting: Registered"

## 2010-06-24 ENCOUNTER — Ambulatory Visit: Payer: Medicare Other | Attending: Surgery | Admitting: Surgery

## 2010-06-24 ENCOUNTER — Encounter (HOSPITAL_BASED_OUTPATIENT_CLINIC_OR_DEPARTMENT_OTHER): Payer: Medicare Other | Admitting: Psychiatry

## 2010-06-24 DIAGNOSIS — I10 Essential (primary) hypertension: Secondary | ICD-10-CM | POA: Insufficient documentation

## 2010-06-24 DIAGNOSIS — Z9884 Bariatric surgery status: Secondary | ICD-10-CM | POA: Insufficient documentation

## 2010-06-24 DIAGNOSIS — K219 Gastro-esophageal reflux disease without esophagitis: Secondary | ICD-10-CM

## 2010-06-24 DIAGNOSIS — R110 Nausea: Secondary | ICD-10-CM | POA: Insufficient documentation

## 2010-06-24 DIAGNOSIS — G4733 Obstructive sleep apnea (adult) (pediatric): Secondary | ICD-10-CM

## 2010-06-24 DIAGNOSIS — Z48815 Encounter for surgical aftercare following surgery on the digestive system: Secondary | ICD-10-CM | POA: Insufficient documentation

## 2010-06-25 ENCOUNTER — Ambulatory Visit: Payer: Medicare Other | Attending: Family Medicine | Admitting: Family Medicine

## 2010-06-25 DIAGNOSIS — R110 Nausea: Secondary | ICD-10-CM | POA: Insufficient documentation

## 2010-06-25 DIAGNOSIS — F3289 Other specified depressive episodes: Secondary | ICD-10-CM | POA: Insufficient documentation

## 2010-06-26 ENCOUNTER — Ambulatory Visit (HOSPITAL_BASED_OUTPATIENT_CLINIC_OR_DEPARTMENT_OTHER): Payer: Medicare Other | Attending: Psychiatry | Admitting: Psychiatry

## 2010-06-26 DIAGNOSIS — G43909 Migraine, unspecified, not intractable, without status migrainosus: Secondary | ICD-10-CM | POA: Insufficient documentation

## 2010-06-26 DIAGNOSIS — E669 Obesity, unspecified: Secondary | ICD-10-CM | POA: Insufficient documentation

## 2010-06-26 DIAGNOSIS — G4733 Obstructive sleep apnea (adult) (pediatric): Secondary | ICD-10-CM | POA: Insufficient documentation

## 2010-06-26 DIAGNOSIS — F329 Major depressive disorder, single episode, unspecified: Secondary | ICD-10-CM | POA: Insufficient documentation

## 2010-06-26 DIAGNOSIS — F509 Eating disorder, unspecified: Secondary | ICD-10-CM | POA: Insufficient documentation

## 2010-07-02 ENCOUNTER — Encounter (HOSPITAL_BASED_OUTPATIENT_CLINIC_OR_DEPARTMENT_OTHER): Payer: Medicare Other

## 2010-07-10 ENCOUNTER — Encounter (HOSPITAL_BASED_OUTPATIENT_CLINIC_OR_DEPARTMENT_OTHER): Payer: Medicare Other | Admitting: Psychiatry

## 2010-07-17 ENCOUNTER — Ambulatory Visit (HOSPITAL_BASED_OUTPATIENT_CLINIC_OR_DEPARTMENT_OTHER): Payer: Medicare Other | Attending: Psychiatry | Admitting: Psychiatry

## 2010-07-17 DIAGNOSIS — F325 Major depressive disorder, single episode, in full remission: Secondary | ICD-10-CM | POA: Insufficient documentation

## 2010-07-17 DIAGNOSIS — E669 Obesity, unspecified: Secondary | ICD-10-CM | POA: Insufficient documentation

## 2010-07-17 DIAGNOSIS — G43909 Migraine, unspecified, not intractable, without status migrainosus: Secondary | ICD-10-CM | POA: Insufficient documentation

## 2010-07-17 DIAGNOSIS — H16309 Unspecified interstitial keratitis, unspecified eye: Secondary | ICD-10-CM | POA: Insufficient documentation

## 2010-07-17 DIAGNOSIS — G4733 Obstructive sleep apnea (adult) (pediatric): Secondary | ICD-10-CM | POA: Insufficient documentation

## 2010-07-24 ENCOUNTER — Encounter (HOSPITAL_BASED_OUTPATIENT_CLINIC_OR_DEPARTMENT_OTHER): Payer: Medicare Other | Admitting: Psychiatry

## 2010-07-29 ENCOUNTER — Ambulatory Visit (HOSPITAL_BASED_OUTPATIENT_CLINIC_OR_DEPARTMENT_OTHER): Payer: Medicare Other | Admitting: Registered"

## 2010-07-29 ENCOUNTER — Ambulatory Visit: Payer: Medicare Other | Attending: Surgery | Admitting: Surgery

## 2010-07-29 DIAGNOSIS — Z9884 Bariatric surgery status: Secondary | ICD-10-CM | POA: Insufficient documentation

## 2010-07-29 DIAGNOSIS — Z6841 Body Mass Index (BMI) 40.0 and over, adult: Secondary | ICD-10-CM | POA: Insufficient documentation

## 2010-07-29 DIAGNOSIS — Z48815 Encounter for surgical aftercare following surgery on the digestive system: Secondary | ICD-10-CM | POA: Insufficient documentation

## 2010-07-31 ENCOUNTER — Ambulatory Visit (HOSPITAL_BASED_OUTPATIENT_CLINIC_OR_DEPARTMENT_OTHER): Payer: Medicare Other | Attending: Psychiatry | Admitting: Psychiatry

## 2010-07-31 DIAGNOSIS — F509 Eating disorder, unspecified: Secondary | ICD-10-CM | POA: Insufficient documentation

## 2010-07-31 DIAGNOSIS — G43909 Migraine, unspecified, not intractable, without status migrainosus: Secondary | ICD-10-CM | POA: Insufficient documentation

## 2010-07-31 DIAGNOSIS — E669 Obesity, unspecified: Secondary | ICD-10-CM | POA: Insufficient documentation

## 2010-07-31 DIAGNOSIS — G4733 Obstructive sleep apnea (adult) (pediatric): Secondary | ICD-10-CM | POA: Insufficient documentation

## 2010-07-31 DIAGNOSIS — F325 Major depressive disorder, single episode, in full remission: Secondary | ICD-10-CM | POA: Insufficient documentation

## 2010-08-03 ENCOUNTER — Ambulatory Visit (INDEPENDENT_AMBULATORY_CARE_PROVIDER_SITE_OTHER): Payer: Medicare Other | Admitting: Wound Care

## 2010-08-05 ENCOUNTER — Ambulatory Visit (INDEPENDENT_AMBULATORY_CARE_PROVIDER_SITE_OTHER): Payer: Medicare Other | Admitting: Wound Care

## 2010-08-05 NOTE — Progress Notes (Signed)
No show

## 2010-08-07 ENCOUNTER — Encounter (HOSPITAL_BASED_OUTPATIENT_CLINIC_OR_DEPARTMENT_OTHER): Payer: Medicare Other | Admitting: Psychiatry

## 2010-08-13 ENCOUNTER — Ambulatory Visit (INDEPENDENT_AMBULATORY_CARE_PROVIDER_SITE_OTHER): Payer: Medicare Other | Admitting: Medical

## 2010-08-14 ENCOUNTER — Encounter (HOSPITAL_BASED_OUTPATIENT_CLINIC_OR_DEPARTMENT_OTHER): Payer: Medicare Other | Admitting: Psychiatry

## 2010-08-16 ENCOUNTER — Ambulatory Visit (HOSPITAL_BASED_OUTPATIENT_CLINIC_OR_DEPARTMENT_OTHER): Payer: Medicare Other | Attending: Psychiatry | Admitting: Psychiatry

## 2010-08-16 DIAGNOSIS — G43909 Migraine, unspecified, not intractable, without status migrainosus: Secondary | ICD-10-CM | POA: Insufficient documentation

## 2010-08-16 DIAGNOSIS — F509 Eating disorder, unspecified: Secondary | ICD-10-CM | POA: Insufficient documentation

## 2010-08-16 DIAGNOSIS — E669 Obesity, unspecified: Secondary | ICD-10-CM | POA: Insufficient documentation

## 2010-08-16 DIAGNOSIS — G4733 Obstructive sleep apnea (adult) (pediatric): Secondary | ICD-10-CM | POA: Insufficient documentation

## 2010-08-19 ENCOUNTER — Telehealth (INDEPENDENT_AMBULATORY_CARE_PROVIDER_SITE_OTHER): Payer: Self-pay | Admitting: Family Medicine

## 2010-08-19 NOTE — Telephone Encounter (Signed)
CCR: Unable to reach pt at the number that she called from (x 2)  LVM at her home number to CB if concerns questions and RN's will call tomorrow.  If she CB today after five please transfer to CCL.

## 2010-08-20 NOTE — Telephone Encounter (Signed)
Requested.

## 2010-08-20 NOTE — Telephone Encounter (Signed)
Went to ER last night, [place], on Antibiotics for UTI, has OV on 2/23. Will CB if symptoms not improving or worsening.     PSR please request and scan in ER documentation from [facility] and close TE when done. Thank you.

## 2010-08-20 NOTE — Telephone Encounter (Signed)
LVM if urgent questions/concerns CB, we will CB between 1-3pm/ps   Still unable to reach with Mobile # listed, message left on home line

## 2010-08-22 NOTE — Telephone Encounter (Signed)
Noted. Will check back later this morning.

## 2010-08-22 NOTE — Telephone Encounter (Signed)
Have you seen these?

## 2010-08-22 NOTE — Telephone Encounter (Signed)
Records not in Dr [employer] box.   Sent another request. Forwarding to [place], [place] as FYI for records.

## 2010-08-22 NOTE — Telephone Encounter (Signed)
Records received. Placed on Dr. Bary Castilla desk to review.

## 2010-08-28 ENCOUNTER — Ambulatory Visit (HOSPITAL_BASED_OUTPATIENT_CLINIC_OR_DEPARTMENT_OTHER): Payer: Medicare Other | Attending: Psychiatry | Admitting: Psychiatry

## 2010-08-28 DIAGNOSIS — F509 Eating disorder, unspecified: Secondary | ICD-10-CM | POA: Insufficient documentation

## 2010-08-28 DIAGNOSIS — G43909 Migraine, unspecified, not intractable, without status migrainosus: Secondary | ICD-10-CM | POA: Insufficient documentation

## 2010-08-28 DIAGNOSIS — G4733 Obstructive sleep apnea (adult) (pediatric): Secondary | ICD-10-CM | POA: Insufficient documentation

## 2010-08-28 DIAGNOSIS — E669 Obesity, unspecified: Secondary | ICD-10-CM | POA: Insufficient documentation

## 2010-08-28 DIAGNOSIS — F329 Major depressive disorder, single episode, unspecified: Secondary | ICD-10-CM | POA: Insufficient documentation

## 2010-08-29 ENCOUNTER — Ambulatory Visit (INDEPENDENT_AMBULATORY_CARE_PROVIDER_SITE_OTHER): Payer: Medicare Other | Admitting: Wound Care

## 2010-08-29 VITALS — BP 120/86 | HR 88 | Temp 98.4°F | Resp 16 | Wt >= 6400 oz

## 2010-08-29 DIAGNOSIS — E539 Vitamin B deficiency, unspecified: Secondary | ICD-10-CM

## 2010-08-29 DIAGNOSIS — R296 Repeated falls: Secondary | ICD-10-CM

## 2010-08-29 DIAGNOSIS — M25519 Pain in unspecified shoulder: Secondary | ICD-10-CM

## 2010-08-29 MED ORDER — CYCLOBENZAPRINE HCL 10 MG OR TABS
ORAL_TABLET | ORAL | Status: DC
Start: 2010-08-29 — End: 2010-09-27

## 2010-08-29 MED ORDER — OXYCODONE-ACETAMINOPHEN 5-325 MG OR TABS
ORAL_TABLET | ORAL | Status: AC
Start: 2010-08-29 — End: 2010-09-07

## 2010-08-29 MED ORDER — ANIMAL CHEWABLE MULTIPLE VIT OR CHEW
CHEWABLE_TABLET | ORAL | Status: DC
Start: 2010-08-29 — End: 2011-03-21

## 2010-08-29 NOTE — Progress Notes (Signed)
Pt is a 35 year old female  here today for back pain  Reviewed history obtained by MA and agree on 08/29/2010    Patient fell while carrying laundry couple days ago. Hurt her upper left back  Increased pain. 5/10 to 8/10. Unable to sleep.  No head trauma.     Overall is doing well with her gastric sugery.   Needs B12 injection.      Patient Active Problem List   Diagnoses   . BENIGN HYPERTENSION   . MIGRAINE NOS INTRACTABLE   . MORBID OBESITY   . CHEST PAIN NOS     Current Outpatient Prescriptions   Medication Sig   . Albuterol 90 MCG/ACT IN AERS 2 puffs inhaled every 4 hours as needed for wheezing and/or cough   . Albuterol 90 MCG/ACT IN AERS two puff as needed    . Aspirin (SB LOW DOSE ASA EC) 81 MG Oral Tab EC 1 TABLET DAILY   . Bisacodyl 5 MG Oral Tab EC one tablet daily for constipation   . BuPROPion HCl (WELLBUTRIN SR) 200 MG OR TB12 400 mg a day two tablet twice a day    . Calcium Carbonate (CALCIUM 500) 1250 MG Oral Tab one tablet three times daily   . Celecoxib (CELEBREX) 200 MG Oral Cap Take 1 capsule daily for arthritis   . Cetirizine HCl (ZYRTEC) 10 MG Oral Tab Take 1 tablet every day for allergies   . Cholecalciferol (VITAMIN D) 2000 UNIT OR CAPS one tablet daily    . Citalopram Hydrobromide 40 MG OR TABS once a day    . ClonazePAM 1 MG Oral Tab One tab as needed daily for stress.   . Eletriptan Hydrobromide (RELPAX) 40 MG OR TABS take one tablet by mouth at onset of headache    . Ketoconazole 2 % EX CREA apply to rash area three times a day   . Lisinopril 5 MG Oral Tab Take 1 tablet by mouth every day for HTN. new med   . Omeprazole 20 MG OR TBEC take two capsules by mouth every day    . Polyethylene Glycol 3350 (MIRALAX) Oral Powder one dose daily as needed for constipation   . Prenatal Vit-Fe Fumarate-FA (M-VIT) Oral Tab any daily multivitamin that is covered by insurance - once daily   . Prochlorperazine Maleate 10 MG OR TABS take one tablet by mouth every 6 hours as needed for nausea/headache   .  Propranolol HCl 40 MG Oral Tab 1 TABLET daily   . TraZODone HCl 50 MG Oral Tab per psych - one daily     History   Substance Use Topics   . Smoking status: Never Smoker    . Smokeless tobacco: Not on file   . Alcohol Use: No       O/e  Gen alert    BP 120/86  Pulse 88  Temp(Src) 98.4 F (36.9 C) (Temporal)  Resp 16  Wt 428 lb (194.14 kg)    Back tenderness over left upper scapular area and trapezius muscle. No erythema no swelling.   Shoulder ROM fairly normal.      ASSESMENT AND PLAN   MORBID OBESITY  (primary encounter diagnosis)  Comment: Plan: VITAMIN B12 INJECTION, VITAMIN B12 INJECTION             VITAMIN B DEFICIENCY NOS  Comment: Plan: VITAMIN B12 INJECTION, VITAMIN B12 INJECTION             JOINT PAIN-SHLDER  Comment: upper  neck /spasm . Will check for fractures.   Plan: X-RAY SHOULDER 2+ VW LEFT        Use prn pain medication and muscle relaxant. Call prn.

## 2010-08-29 NOTE — Progress Notes (Signed)
Why is patient here today? Pt is here for a follow up. Pt needs to discuss getting B-12 injection. Pt received her last B-12 on 07/29/10.     Any new significant medical diagnoses since last visit? NO    Refills? NO    Referral? NO    Letter or form? NO    Lab results? NO    Health maintenance issues? YES:

## 2010-09-02 NOTE — Progress Notes (Signed)
Addended by: Porfirio Mylar CID on: 09/02/2010 02:40 PM     Modules accepted: Orders

## 2010-09-09 ENCOUNTER — Encounter (HOSPITAL_BASED_OUTPATIENT_CLINIC_OR_DEPARTMENT_OTHER): Payer: Medicare Other | Admitting: Surgery

## 2010-09-11 ENCOUNTER — Ambulatory Visit (HOSPITAL_BASED_OUTPATIENT_CLINIC_OR_DEPARTMENT_OTHER): Payer: Medicare Other | Attending: Psychiatry | Admitting: Psychiatry

## 2010-09-11 DIAGNOSIS — F509 Eating disorder, unspecified: Secondary | ICD-10-CM | POA: Insufficient documentation

## 2010-09-11 DIAGNOSIS — G43909 Migraine, unspecified, not intractable, without status migrainosus: Secondary | ICD-10-CM | POA: Insufficient documentation

## 2010-09-11 DIAGNOSIS — G4733 Obstructive sleep apnea (adult) (pediatric): Secondary | ICD-10-CM | POA: Insufficient documentation

## 2010-09-11 DIAGNOSIS — E669 Obesity, unspecified: Secondary | ICD-10-CM | POA: Insufficient documentation

## 2010-09-16 ENCOUNTER — Encounter (HOSPITAL_BASED_OUTPATIENT_CLINIC_OR_DEPARTMENT_OTHER): Payer: Medicare Other | Admitting: Registered"

## 2010-09-16 ENCOUNTER — Encounter (HOSPITAL_BASED_OUTPATIENT_CLINIC_OR_DEPARTMENT_OTHER): Payer: Medicare Other | Admitting: Surgery

## 2010-09-18 ENCOUNTER — Encounter (HOSPITAL_BASED_OUTPATIENT_CLINIC_OR_DEPARTMENT_OTHER): Payer: Medicare Other | Admitting: Psychiatry

## 2010-09-24 ENCOUNTER — Other Ambulatory Visit (INDEPENDENT_AMBULATORY_CARE_PROVIDER_SITE_OTHER): Payer: Self-pay | Admitting: Wound Care

## 2010-09-24 DIAGNOSIS — Z029 Encounter for administrative examinations, unspecified: Secondary | ICD-10-CM

## 2010-09-25 ENCOUNTER — Encounter (HOSPITAL_BASED_OUTPATIENT_CLINIC_OR_DEPARTMENT_OTHER): Payer: Medicare Other | Admitting: Psychiatry

## 2010-09-27 ENCOUNTER — Ambulatory Visit (INDEPENDENT_AMBULATORY_CARE_PROVIDER_SITE_OTHER): Payer: Self-pay

## 2010-09-27 ENCOUNTER — Encounter (INDEPENDENT_AMBULATORY_CARE_PROVIDER_SITE_OTHER): Payer: Self-pay | Admitting: Wound Care

## 2010-09-27 ENCOUNTER — Other Ambulatory Visit (INDEPENDENT_AMBULATORY_CARE_PROVIDER_SITE_OTHER): Payer: Medicare Other

## 2010-09-27 ENCOUNTER — Ambulatory Visit (INDEPENDENT_AMBULATORY_CARE_PROVIDER_SITE_OTHER): Payer: Medicare Other | Admitting: Wound Care

## 2010-09-27 VITALS — BP 130/90 | HR 80 | Temp 98.0°F | Resp 16 | Wt >= 6400 oz

## 2010-09-27 DIAGNOSIS — E539 Vitamin B deficiency, unspecified: Secondary | ICD-10-CM

## 2010-09-27 DIAGNOSIS — I10 Essential (primary) hypertension: Secondary | ICD-10-CM

## 2010-09-27 DIAGNOSIS — S93409A Sprain of unspecified ligament of unspecified ankle, initial encounter: Secondary | ICD-10-CM

## 2010-09-27 LAB — COMPREHENSIVE METABOLIC PANEL
ALT (GPT): 19 U/L (ref 6–40)
AST (GOT): 19 U/L (ref 15–40)
Albumin: 3.6 g/dL (ref 3.5–5.2)
Alkaline Phosphatase (Total): 62 U/L (ref 25–100)
Anion Gap: 5 (ref 3–11)
Bilirubin (Total): 1.1 mg/dL (ref 0.2–1.3)
Calcium: 9 mg/dL (ref 8.9–10.2)
Carbon Dioxide, Total: 23 mEq/L (ref 22–32)
Chloride: 106 mEq/L (ref 98–108)
Creatinine: 0.58 mg/dL (ref 0.38–1.02)
GFR, Calc, African American: >60 mL/min (ref >59)
GFR, Calc, European American: >60 mL/min (ref >59)
Glucose: 103 mg/dL (ref 62–125)
Potassium: 3.9 mEq/L (ref 3.7–5.2)
Protein (Total): 6.3 g/dL (ref 6.0–8.2)
Sodium: 134 mEq/L — ABNORMAL LOW (ref 136–145)
Urea Nitrogen: 10 mg/dL (ref 8–21)

## 2010-09-27 LAB — CBC (HEMOGRAM)
Hematocrit: 40 % (ref 36–45)
Hemoglobin: 12.9 g/dL (ref 11.5–15.5)
MCH: 27.1 pg — ABNORMAL LOW (ref 27.3–33.6)
MCHC: 31.9 g/dL — ABNORMAL LOW (ref 32.2–36.5)
MCV: 85 fL (ref 81–98)
Platelet Count: 223 10*3/uL (ref 150–400)
RBC: 4.76 mil/uL (ref 3.80–5.00)
RDW-CV: 14.8 % — ABNORMAL HIGH (ref 11.6–14.4)
WBC: 7.59 10*3/uL (ref 4.3–10.0)

## 2010-09-27 MED ORDER — NAPROXEN 500 MG OR TABS
ORAL_TABLET | ORAL | Status: DC
Start: 2010-09-27 — End: 2010-11-04

## 2010-09-27 NOTE — Progress Notes (Signed)
labs

## 2010-09-27 NOTE — Progress Notes (Signed)
Why is patient here today? Patient here to follow up on ER visit, left foot/ankle sprain, also would like to receive a B12 injection    Any new significant medical diagnoses since last visit? NO    Refills? NO    Referral? NO    Letter or form? YES: letter for school, missed since Tuesday due to foot injury    Lab results? NO    Health maintenance issues? NO

## 2010-09-27 NOTE — Progress Notes (Addendum)
Pt is a 35 year old female  here today for ankle pain   Reviewed history obtained by MA and agree on 09/27/2010    Pt sprained left ankle Tuesday. Went to [facility]. No fracture. No records available.  Pain 8/10 when she walks on it. Hurts a lot at night. She is in school.   She has a splint but it is not working.     She also wants blood test.     She needs her B12 shot.     Patient Active Problem List   Diagnoses   . BENIGN HYPERTENSION   . MIGRAINE NOS INTRACTABLE   . MORBID OBESITY   . CHEST PAIN NOS     Current Outpatient Prescriptions   Medication Sig   . Albuterol 90 MCG/ACT IN AERS 2 puffs inhaled every 4 hours as needed for wheezing and/or cough   . Albuterol 90 MCG/ACT IN AERS two puff as needed    . Aspirin (SB LOW DOSE ASA EC) 81 MG Oral Tab EC 1 TABLET DAILY   . Bisacodyl 5 MG Oral Tab EC one tablet daily for constipation   . BuPROPion HCl (WELLBUTRIN SR) 200 MG OR TB12 400 mg a day two tablet twice a day    . Calcium Carbonate (CALCIUM 500) 1250 MG Oral Tab one tablet three times daily   . Celecoxib (CELEBREX) 200 MG Oral Cap Take 1 capsule daily for arthritis   . Cetirizine HCl (ZYRTEC) 10 MG Oral Tab Take 1 tablet every day for allergies   . Cholecalciferol (VITAMIN D) 2000 UNIT OR CAPS one tablet daily    . Citalopram Hydrobromide 40 MG OR TABS once a day    . ClonazePAM 1 MG Oral Tab One tab as needed daily for stress.   . Cyclobenzaprine HCl (FLEXERIL) 10 MG Oral Tab Take 1 tablet by mouth 3 times per day as needed for muscle pain   . Eletriptan Hydrobromide (RELPAX) 40 MG OR TABS take one tablet by mouth at onset of headache    . Ketoconazole 2 % EX CREA apply to rash area three times a day   . Lisinopril 5 MG Oral Tab Take 1 tablet by mouth every day for HTN. new med   . Omeprazole 20 MG OR TBEC take two capsules by mouth every day    . Pediatric Multivitamins-Iron (ANIMAL CHEWABLE MULTIPLE VIT) Oral Chew Tab one chewable vitamin daily   . Polyethylene Glycol 3350 (MIRALAX) Oral Powder one dose  daily as needed for constipation   . Prenatal Vit-Fe Fumarate-FA (M-VIT) Oral Tab any daily multivitamin that is covered by insurance - once daily   . Prochlorperazine Maleate 10 MG OR TABS take one tablet by mouth every 6 hours as needed for nausea/headache   . Propranolol HCl 40 MG Oral Tab 1 TABLET daily   . TraZODone HCl 50 MG Oral Tab per psych - one daily     History   Substance Use Topics   . Smoking status: Former Games developer   . Smokeless tobacco: Not on file   . Alcohol Use: No       review of systems:  constitutional Negative   Resp neg  Cardiac-neg. Her chest pains have resolved as her chest size has decrease. Total 110 pound wt loss.  Skin her skin issues have also resolved.     O/e  Gen alert    BP 130/90  Pulse 80  Temp(Src) 98 F (36.7 C) (Temporal)  Resp 16  Wt 421 lb (190.964 kg)  Left foot- ankle ROM normal. Mild tenderness over the lateral aspect. No definite localization of tenderness.  No lateral malleoli issue.     ASSESMENT AND PLAN  845.00 SPRAIN OF ANKLE NOS  (primary encounter diagnosis)  Comment: NEEDS BOOT. SHE IS high risk for tearing the tendons secondary to her wt.   Plan: [facility] TO [facility], VENIPUNCTURE FEE        If pain continues will need re-xray.     401.1 BENIGN HYPERTENSION  Comment: fairly stable. Slightly high DBP. Will wait for recheck when not in pain  Plan: CBC (HEMOGRAM), COMPREHENSIVE METABOLIC PANEL,         VENIPUNCTURE FEE            278.01 MORBID OBESITY  Comment: s/p surgical correction. b12 today  Plan: VITAMIN B12 INJECTION, VENIPUNCTURE FEE            266.9 VITAMIN B DEFICIENCY NOS  Comment:   Plan: VITAMIN B12 INJECTION, VENIPUNCTURE FEE

## 2010-09-29 NOTE — Progress Notes (Signed)
u

## 2010-10-02 ENCOUNTER — Ambulatory Visit (HOSPITAL_BASED_OUTPATIENT_CLINIC_OR_DEPARTMENT_OTHER): Payer: Medicare Other | Attending: Psychiatry | Admitting: Psychiatry

## 2010-10-02 DIAGNOSIS — G4733 Obstructive sleep apnea (adult) (pediatric): Secondary | ICD-10-CM | POA: Insufficient documentation

## 2010-10-02 DIAGNOSIS — F509 Eating disorder, unspecified: Secondary | ICD-10-CM | POA: Insufficient documentation

## 2010-10-02 DIAGNOSIS — G43909 Migraine, unspecified, not intractable, without status migrainosus: Secondary | ICD-10-CM | POA: Insufficient documentation

## 2010-10-02 DIAGNOSIS — E669 Obesity, unspecified: Secondary | ICD-10-CM | POA: Insufficient documentation

## 2010-10-02 DIAGNOSIS — F329 Major depressive disorder, single episode, unspecified: Secondary | ICD-10-CM | POA: Insufficient documentation

## 2010-10-07 ENCOUNTER — Other Ambulatory Visit (HOSPITAL_BASED_OUTPATIENT_CLINIC_OR_DEPARTMENT_OTHER): Payer: Self-pay | Admitting: Surgery

## 2010-10-07 ENCOUNTER — Ambulatory Visit: Payer: Medicare Other | Attending: Surgery | Admitting: Registered"

## 2010-10-07 ENCOUNTER — Ambulatory Visit (HOSPITAL_BASED_OUTPATIENT_CLINIC_OR_DEPARTMENT_OTHER): Payer: Medicare Other | Admitting: Surgery

## 2010-10-07 DIAGNOSIS — G4733 Obstructive sleep apnea (adult) (pediatric): Secondary | ICD-10-CM

## 2010-10-07 DIAGNOSIS — Z9884 Bariatric surgery status: Secondary | ICD-10-CM | POA: Insufficient documentation

## 2010-10-07 DIAGNOSIS — Z6841 Body Mass Index (BMI) 40.0 and over, adult: Secondary | ICD-10-CM | POA: Insufficient documentation

## 2010-10-07 LAB — CBC, DIFF
% Basophils: 0 % (ref 0–1)
% Eosinophils: 1 % (ref 0–7)
% Immature Granulocytes: 0 % (ref 0–1)
% Lymphocytes: 23 % (ref 19–53)
% Monocytes: 5 % (ref 5–13)
% Neutrophils: 71 % (ref 34–71)
Absolute Eosinophil Count: 0.06 10*3/uL (ref 0.00–0.50)
Absolute Lymphocyte Count: 2.21 10*3/uL (ref 1.00–4.80)
Basophils: 0.03 10*3/uL (ref 0.00–0.20)
Hematocrit: 40 % (ref 36–45)
Hemoglobin: 12.7 g/dL (ref 11.5–15.5)
Immature Granulocytes: 0.02 10*3/uL (ref 0.00–0.05)
MCH: 27.6 pg (ref 27.3–33.6)
MCHC: 32.2 g/dL (ref 32.2–36.5)
MCV: 86 fL (ref 81–98)
Monocytes: 0.48 10*3/uL (ref 0.00–0.80)
Neutrophils: 6.99 10*3/uL (ref 1.80–7.00)
Platelet Count: 271 10*3/uL (ref 150–400)
RBC: 4.6 mil/uL (ref 3.80–5.00)
RDW-CV: 15 % — ABNORMAL HIGH (ref 11.6–14.4)
WBC: 9.79 10*3/uL (ref 4.3–10.0)

## 2010-10-07 LAB — ALBUMIN, SERUM: Albumin: 3.4 g/dL — ABNORMAL LOW (ref 3.5–5.2)

## 2010-10-09 ENCOUNTER — Ambulatory Visit (HOSPITAL_BASED_OUTPATIENT_CLINIC_OR_DEPARTMENT_OTHER): Payer: Medicare Other | Attending: Psychiatry | Admitting: Psychiatry

## 2010-10-09 DIAGNOSIS — F329 Major depressive disorder, single episode, unspecified: Secondary | ICD-10-CM | POA: Insufficient documentation

## 2010-10-09 DIAGNOSIS — G43909 Migraine, unspecified, not intractable, without status migrainosus: Secondary | ICD-10-CM | POA: Insufficient documentation

## 2010-10-09 DIAGNOSIS — G4733 Obstructive sleep apnea (adult) (pediatric): Secondary | ICD-10-CM | POA: Insufficient documentation

## 2010-10-09 DIAGNOSIS — F509 Eating disorder, unspecified: Secondary | ICD-10-CM | POA: Insufficient documentation

## 2010-10-09 DIAGNOSIS — E669 Obesity, unspecified: Secondary | ICD-10-CM | POA: Insufficient documentation

## 2010-10-09 LAB — VITAMIN D (25 HYDROXY)
Vit D (25_Hydroxy) Total: 15.9 ng/mL — ABNORMAL LOW (ref 20.1–50.0)
Vitamin D2 (25_Hydroxy): <1 ng/mL
Vitamin D3 (25_Hydroxy): 15.9 ng/mL

## 2010-10-13 LAB — CBC
Hemoglobin: 13.4 g/dL (ref 12.0–15.0)
MCHC: 32.9 g/dL (ref 30.0–36.0)
MCV: 79.1 fL (ref 78.0–100.0)
RBC: 5.17 MIL/uL — ABNORMAL HIGH (ref 3.87–5.11)

## 2010-10-13 LAB — BASIC METABOLIC PANEL
GFR calc Af Amer: >60 mL/min (ref >60)
GFR calc non Af Amer: >60 mL/min (ref >60)
Potassium: 3.7 mEq/L (ref 3.5–5.1)
Sodium: 137 mEq/L (ref 135–145)

## 2010-10-16 ENCOUNTER — Ambulatory Visit (HOSPITAL_BASED_OUTPATIENT_CLINIC_OR_DEPARTMENT_OTHER): Payer: Medicare Other | Attending: Psychiatry | Admitting: Psychiatry

## 2010-10-16 DIAGNOSIS — G43909 Migraine, unspecified, not intractable, without status migrainosus: Secondary | ICD-10-CM | POA: Insufficient documentation

## 2010-10-16 DIAGNOSIS — F509 Eating disorder, unspecified: Secondary | ICD-10-CM | POA: Insufficient documentation

## 2010-10-16 DIAGNOSIS — E669 Obesity, unspecified: Secondary | ICD-10-CM | POA: Insufficient documentation

## 2010-10-16 DIAGNOSIS — G4733 Obstructive sleep apnea (adult) (pediatric): Secondary | ICD-10-CM | POA: Insufficient documentation

## 2010-10-17 ENCOUNTER — Emergency Department
Admission: EM | Admit: 2010-10-17 | Discharge: 2010-10-17 | Disposition: A | Discharge status: Home / Self Care | Payer: Medicare Other | Attending: Family | Admitting: Family

## 2010-10-17 DIAGNOSIS — K089 Disorder of teeth and supporting structures, unspecified: Secondary | ICD-10-CM | POA: Insufficient documentation

## 2010-10-25 ENCOUNTER — Encounter (HOSPITAL_BASED_OUTPATIENT_CLINIC_OR_DEPARTMENT_OTHER): Payer: Medicare Other | Admitting: Psychiatry

## 2010-10-26 ENCOUNTER — Other Ambulatory Visit (INDEPENDENT_AMBULATORY_CARE_PROVIDER_SITE_OTHER): Payer: Self-pay | Admitting: Wound Care

## 2010-10-26 DIAGNOSIS — Z029 Encounter for administrative examinations, unspecified: Secondary | ICD-10-CM

## 2010-11-01 ENCOUNTER — Ambulatory Visit (HOSPITAL_BASED_OUTPATIENT_CLINIC_OR_DEPARTMENT_OTHER): Payer: Medicare Other | Admitting: Psychiatry

## 2010-11-01 ENCOUNTER — Ambulatory Visit (INDEPENDENT_AMBULATORY_CARE_PROVIDER_SITE_OTHER): Payer: Medicare Other

## 2010-11-01 ENCOUNTER — Telehealth (INDEPENDENT_AMBULATORY_CARE_PROVIDER_SITE_OTHER): Payer: Self-pay | Admitting: Wound Care

## 2010-11-01 ENCOUNTER — Emergency Department (HOSPITAL_BASED_OUTPATIENT_CLINIC_OR_DEPARTMENT_OTHER)
Admission: EM | Admit: 2010-11-01 | Discharge: 2010-11-01 | Disposition: A | Discharge status: Home / Self Care | Payer: Medicare Other | Attending: Psychiatry | Admitting: Psychiatry

## 2010-11-01 DIAGNOSIS — F509 Eating disorder, unspecified: Secondary | ICD-10-CM | POA: Insufficient documentation

## 2010-11-01 DIAGNOSIS — F39 Unspecified mood [affective] disorder: Secondary | ICD-10-CM

## 2010-11-01 DIAGNOSIS — I10 Essential (primary) hypertension: Secondary | ICD-10-CM

## 2010-11-01 DIAGNOSIS — G4700 Insomnia, unspecified: Secondary | ICD-10-CM | POA: Insufficient documentation

## 2010-11-01 DIAGNOSIS — R7309 Other abnormal glucose: Secondary | ICD-10-CM | POA: Insufficient documentation

## 2010-11-01 DIAGNOSIS — G4730 Sleep apnea, unspecified: Secondary | ICD-10-CM

## 2010-11-01 DIAGNOSIS — F603 Borderline personality disorder: Secondary | ICD-10-CM | POA: Insufficient documentation

## 2010-11-01 DIAGNOSIS — Z8679 Personal history of other diseases of the circulatory system: Secondary | ICD-10-CM | POA: Insufficient documentation

## 2010-11-01 DIAGNOSIS — F3289 Other specified depressive episodes: Secondary | ICD-10-CM | POA: Insufficient documentation

## 2010-11-01 NOTE — Telephone Encounter (Signed)
Noted! Thank you

## 2010-11-01 NOTE — Telephone Encounter (Signed)
CONFIRMED PHONE NUMBER: [phone]  CALLERS FIRST AND LAST NAME: Pershing Proud  FACILITY NAME: n/a TITLE: n/a  CALLERS RELATIONSHIP:Self  RETURN CALL: No call back necessary    SUBJECT: General Message   REASON FOR REQUEST: Injection question    MESSAGE: Patient had a visit with the clinic staff scheduled as well as an office visit with a provider scheduled at different times. Patient is requesting that she get her B12 injection that she had scheduled with the clinic staff at the time of the appointment. Cancelled clinic staff appointment and added B12 injection to notes of office visit. Please call back if for some reason she cannot have the injection at her office visit. Thank you.   FORWARD TO: n/a

## 2010-11-04 ENCOUNTER — Ambulatory Visit (INDEPENDENT_AMBULATORY_CARE_PROVIDER_SITE_OTHER): Payer: Medicare Other | Admitting: Wound Care

## 2010-11-04 VITALS — BP 122/80 | HR 84 | Temp 98.3°F | Resp 16 | Wt >= 6400 oz

## 2010-11-04 DIAGNOSIS — F322 Major depressive disorder, single episode, severe without psychotic features: Secondary | ICD-10-CM

## 2010-11-04 DIAGNOSIS — E539 Vitamin B deficiency, unspecified: Secondary | ICD-10-CM

## 2010-11-04 DIAGNOSIS — S51809A Unspecified open wound of unspecified forearm, initial encounter: Secondary | ICD-10-CM

## 2010-11-04 DIAGNOSIS — R45851 Suicidal ideations: Secondary | ICD-10-CM

## 2010-11-04 DIAGNOSIS — K5900 Constipation, unspecified: Secondary | ICD-10-CM

## 2010-11-04 DIAGNOSIS — S41109A Unspecified open wound of unspecified upper arm, initial encounter: Secondary | ICD-10-CM

## 2010-11-04 MED ORDER — ELETRIPTAN HYDROBROMIDE 40 MG OR TABS
ORAL_TABLET | ORAL | Status: DC
Start: 2010-11-04 — End: 2010-12-23

## 2010-11-04 MED ORDER — DOCUSATE SODIUM 100 MG OR CAPS
ORAL_CAPSULE | ORAL | Status: DC
Start: 2010-11-04 — End: 2011-03-21

## 2010-11-04 MED ORDER — PROCHLORPERAZINE MALEATE 10 MG OR TABS
ORAL_TABLET | ORAL | Status: DC
Start: 2010-11-04 — End: 2011-10-10

## 2010-11-04 NOTE — Progress Notes (Signed)
Why is patient here today? Pt is here for a ER follow up and suture removal from L wrist.     Any new significant medical diagnoses since last visit? NO    Refills? NO    Referral? NO    Letter or form? NO    Lab results? NO    Health maintenance issues? YES:

## 2010-11-04 NOTE — Progress Notes (Signed)
Pt is a 35 year old female  here today for  Stitches.  Reviewed history obtained by MA and agree on 11/04/2010    Sharon Stephens is here for followup on psychiatry admission.   She was admitted to [facility] after slitting open her left wrist. She is going through very severe depression.  She had stopped her Wellbutrin completely. She is now back on Wellbutrin and feeling better.     She had some problem at home with her roommate's. Very frustrated. She then used a kitchen knife to slit her left wrist open. She was taken to the [facility] ER and admitted to [facility].      Patient Active Problem List   Diagnoses   . BENIGN HYPERTENSION   . MIGRAINE NOS INTRACTABLE   . MORBID OBESITY   . CHEST PAIN NOS     Current Outpatient Prescriptions   Medication Sig   . Bisacodyl 5 MG Oral Tab EC one tablet daily for constipation   . BuPROPion HCl (WELLBUTRIN SR) 200 MG OR TB12 400 mg a day two tablet twice a day    . Calcium Carbonate (CALCIUM 500) 1250 MG Oral Tab one tablet three times daily   . Cholecalciferol (VITAMIN D) 2000 UNIT OR CAPS one tablet daily    . Docusate Sodium (COLACE) 100 MG Oral Cap Take 1 capsule 2 times per day for constipation   . Eletriptan Hydrobromide (RELPAX) 40 MG Oral Tab take one tablet by mouth at onset of headache    . Pediatric Multivitamins-Iron (ANIMAL CHEWABLE MULTIPLE VIT) Oral Chew Tab one chewable vitamin daily   . Prochlorperazine Maleate 10 MG Oral Tab take one tablet by mouth every 6 hours as needed for nausea/headache     History   Substance Use Topics   . Smoking status: Former Games developer   . Smokeless tobacco: Not on file   . Alcohol Use: No       review of systems:  constitutional Negative   respiratory Negative   gastrointestinal constipation.    O/e  Gen alert    BP 122/80  Pulse 84  Temp(Src) 98.3 F (36.8 C) (Temporal)  Resp 16  Wt 408 lb (185.068 kg)  Left wrist - left wrist healed laceration. Mild bruising still present.   Stitches were removed. No e/o erythema or  infection.    Affect -good   Insight- present  Suicidal ideations absent   Homicidal ideations absent         ASSESMENT AND PLAN  278.01 MORBID OBESITY  Comment: update today  Plan: VITAMIN B12 INJECTION, Prochlorperazine Maleate        10 MG Oral Tab            266.9 VITAMIN B DEFICIENCY NOS  Comment:  Plan: VITAMIN B12 INJECTION            564.00 UNSPEC CONSTIPATION  Comment: use colace   Plan:     V62.84 SUICIDAL IDEATION  Comment: very concerned by the behavioir. She is seeing psych who is helping her.  Plan: start po wellbutrin and do NOT stop. She understands.  Continue counseling. May need anger management therapy too. Consider mood stabilizer. She will talk to her psych about this.         881.00 OPEN WOUND OF FOREARM  Comment: healed      Greater than 50% of this visit was spent face to face with the patient in counseling and/or coordination of care, and/or discussing treatment/management options, and/or  education with the patient and/or family member.  Total time spent was 35 minutes.

## 2010-11-06 ENCOUNTER — Telehealth (INDEPENDENT_AMBULATORY_CARE_PROVIDER_SITE_OTHER): Payer: Self-pay | Admitting: Wound Care

## 2010-11-06 NOTE — Telephone Encounter (Signed)
Per Dr. Ennis Forts, do not repeat dose and only 1 tablet not to exceed 24 hours. This was relayed to the pharmacist Jasmine December

## 2010-11-06 NOTE — Telephone Encounter (Signed)
CONFIRMED PHONE NUMBER: [phone]  CALLERS FIRST AND LAST NAME: Jasmine December  FACILITY NAME: [facility] [address] TITLE: Pharmacist  CALLERS RELATIONSHIP:OTHER: Pharmacist  RETURN CALL: Detailed message on voicemail only    SUBJECT: General Message   REASON FOR REQUEST: Medication clarification    MESSAGE: Patient was prescribed Relpax; pharmacy would like a return call to clarify the dosage for the prescription. Prescribed by Dr. Ennis Forts.  FORWARD TO: n/a

## 2010-11-06 NOTE — Progress Notes (Signed)
Addended by: Porfirio Mylar CID on: 11/06/2010 03:07 PM     Modules accepted: Orders

## 2010-11-08 ENCOUNTER — Ambulatory Visit (HOSPITAL_BASED_OUTPATIENT_CLINIC_OR_DEPARTMENT_OTHER): Payer: Medicare Other | Attending: Psychiatry | Admitting: Internal Medicine

## 2010-11-08 DIAGNOSIS — F509 Eating disorder, unspecified: Secondary | ICD-10-CM | POA: Insufficient documentation

## 2010-11-08 DIAGNOSIS — F3289 Other specified depressive episodes: Secondary | ICD-10-CM | POA: Insufficient documentation

## 2010-11-11 ENCOUNTER — Ambulatory Visit (INDEPENDENT_AMBULATORY_CARE_PROVIDER_SITE_OTHER): Payer: Medicare Other | Admitting: Family Medicine

## 2010-11-12 ENCOUNTER — Ambulatory Visit (INDEPENDENT_AMBULATORY_CARE_PROVIDER_SITE_OTHER): Payer: Medicare Other | Admitting: Medical

## 2010-11-12 ENCOUNTER — Other Ambulatory Visit (HOSPITAL_BASED_OUTPATIENT_CLINIC_OR_DEPARTMENT_OTHER)
Admit: 2010-11-12 | Discharge: 2010-11-12 | Disposition: A | Discharge status: Home / Self Care | Payer: Medicare Other | Attending: Wound Care | Admitting: Wound Care

## 2010-11-12 VITALS — BP 120/68 | HR 76 | Resp 20 | Ht 65.2 in | Wt >= 6400 oz

## 2010-11-12 DIAGNOSIS — R87610 Atypical squamous cells of undetermined significance on cytologic smear of cervix (ASC-US): Secondary | ICD-10-CM

## 2010-11-12 DIAGNOSIS — Z124 Encounter for screening for malignant neoplasm of cervix: Secondary | ICD-10-CM

## 2010-11-12 DIAGNOSIS — Z13220 Encounter for screening for lipoid disorders: Secondary | ICD-10-CM

## 2010-11-12 DIAGNOSIS — Z01419 Encounter for gynecological examination (general) (routine) without abnormal findings: Secondary | ICD-10-CM | POA: Insufficient documentation

## 2010-11-12 NOTE — Progress Notes (Signed)
Sharon Stephens is a 35 year old female who presents for a Welcome to Medicare Visit (Initial Preventive Physical Exam).     Past Medical History   Diagnosis Date   . DEPRESSIVE DISORDER NEC    . OTHER UNSPEC SLEEP APNEA    . HERNIA NEC      notes having 3 in abdomen    . ALLERGIC RHINITIS NOS    . CHRON OBST ASTHMA WO STATUS ASTHM    . DIABETES UNCOMPL ADULT-TYPE II      broaderline    . ESOPHAGEAL REFLUX    . BENIGN HYPERTENSION      Past Surgical History   Procedure Date   . C-sections    . Gallbladder removed    . Tube tied    . Removal of tonsils    . Appendectomy    . Cesarean delivery only    . Ligate fallopian tube    . Removal gallbladder      Family History   Problem Relation Age of Onset   . Alcohol/Drug No Hx Of    . Diabetes Mother    . Diabetes Maternal Grandmother    . Diabetes Maternal Grandfather    . Diabetes Paternal Grandmother    . Heart disease Maternal Grandfather    . Heart disease Paternal Grandmother    . Hypertension Maternal Grandmother    . Hypertension Maternal Grandfather    . Hypertension Paternal Grandmother    . Hypertension Paternal Grandfather    . Lipids Maternal Grandfather    . Stroke Maternal Grandfather      History     Social History   . Marital Status: Single     Spouse Name: N/A     Number of Children: N/A   . Years of Education: N/A     Occupational History   . Not on file.     Social History Main Topics   . Smoking status: Former Games developer   . Smokeless tobacco: Not on file   . Alcohol Use: No   . Drug Use: No   . Sexually Active: Yes -- Female partner(s)     Other Topics Concern   . Not on file     Social History Narrative    Lives in [place] with friends and children. Moved here to "get aware from my kid's father". Denies hx of abuse. Former smoker, denies TED at current. Unemployed.     Current Outpatient Prescriptions   Medication Sig   . Bisacodyl 5 MG Oral Tab EC one tablet daily for constipation   . BuPROPion HCl (WELLBUTRIN SR) 200 MG OR TB12 400 mg a day two tablet  twice a day    . Calcium Carbonate (CALCIUM 500) 1250 MG Oral Tab one tablet three times daily   . Cholecalciferol (VITAMIN D) 2000 UNIT OR CAPS one tablet daily    . Docusate Sodium (COLACE) 100 MG Oral Cap Take 1 capsule 2 times per day for constipation   . Eletriptan Hydrobromide (RELPAX) 40 MG Oral Tab take one tablet by mouth at onset of headache    . Pediatric Multivitamins-Iron (ANIMAL CHEWABLE MULTIPLE VIT) Oral Chew Tab one chewable vitamin daily   . Prochlorperazine Maleate 10 MG Oral Tab take one tablet by mouth every 6 hours as needed for nausea/headache      Dietary issues discussed:as tolerated since gastric bypass   Current exercise habits:  Type of exercise: walking, plans to start swimming   Frequency of exercise:nealry  daily     ACTIVITIES OF DAILY LIVING:  In your present state of health how much difficulty do you have with the following activities:  Preparing food and eating: 0 No impairment (the person has no problem)  Bathing yourself: 0 No impairment (the person has no problem)  Getting dressed: 0 No impairment (the person has no problem)  Using the toilet: 0 No impairment (the person has no problem)  Moving around from place to place: 0 No impairment (the person has no problem)  In the past year have you fallen or had a near fall? NO  Do you feel safe in your home environment? YES    DEPRESSION SCREEN:   (Note: if answer to either of the following is "Yes", then a more complete depression screening is indicated)  Q1: Over the past two weeks, have you felt down, depressed or hopeless? YES, recent self harm, has seen psychiatrist at [facility] since.   Q2: Over the past two weeks, have you felt little interest or pleasure in doing things? NO    CARDIAC RISK FACTORS:  Smoker:  No  Obesity:  Yes   Diabetic:  No  Known heart disease:  No  Family history of heart disease:  Yes,mother had MI in 30s Sedentary lifestyle:  Yes  Hyperlipidemia:  No     During the course of the visit the patient was educated  and counseled about appropriate screening and preventive services including:    Pneumococcal vaccine:  No  Influenza vaccine:  No  Hepatitis B vaccine:  No  Screening mammography:  No  Screening pap smear and pelvic exam:  Yes  Colorectal cancer screening:  No  Screening for diabetes:  No  Diabetes self management training:  No  Bone densitometry screening:  No  Screening for glaucoma:  No  Nutrition counseling:  No  Cardiovascular screening blood tests:  No  End-of-life planning:  No    OBJECTIVE:  Vitals: within normal limits   Body mass index is 66.49 kg/(m^2).   GENERAL: no acute distress  GAIT: normal  Visual acuity: not assesses   -------------------------    --------------------------      ASSESSMENT AND PLAN:      F/U: PRN for routine care         Patient Ed: Discussed in detail all aspects of the patient's care described above.  Patient Instructions (the written plan) was given to the patient.

## 2010-11-12 NOTE — Patient Instructions (Addendum)
There are a number of additional preventive services and screening tests that are used to detect or reduce risk of common conditions.    These tests are not all covered by Medicare.  Please consider whether you would like any or all of these tests done based on our discussion today.  You can make an appointment for a future visit at which time we can order any or all of these services.  At that time you will need to sign an ABN (Advanced Beneficiary Notification), which is an acknowledgement that you may be billed for these services if they are not covered by Medicare.    Commonly recommended preventive services include:    Pneumococcal vaccine - covered if you are over 64 or high risk    Influenza vaccine - covered     Hepatitis B vaccine - covered if medium/high risk (end-stage renal disease, hemophiliacs who received Factor VIII or IX concentrates, clients of institutions for the mentally retarded, persons who live in the same house as a HepB virus carrier, homosexual men, illicit injectable drug abusers)    Screening mammography - covered once a year over age 36.     Screening pap smear and pelvic exam - covered once every two years    Colorectal cancer screening - stool test is covered yearly, sigmoidoscopy every 4 years, colonoscopy every 10 years.    Screening for diabetes - covered if high risk (high blood pressure, high lipids, obesity (BMI>30), history of abnormal glucose test) or over 65 and one other risk factor: overweight (BMI 25 to 30), family history of diabetes, history of gestational diabetes or baby >9#, or certain medications (steroids).    Diabetes self management training    Bone densitometry screening - covered once every 2 yrs over age 15    Screening for glaucoma - covered yearly if diabetic, family history of glaucoma, or African American over age 33.    Nutrition counseling    Medical nutrition therapy if you have diabetes or kidney disease    Cardiovascular screening blood tests -  cholesterol screening covered every 5 years.    Screening EKG    There are a number of additional preventive services and screening tests that are used to detect or reduce risk of common conditions.    These tests are not all covered by Medicare.  Please consider whether you would like any or all of these tests done based on our discussion today.  You can make an appointment for a future visit at which time we can order any or all of these services.  At that time you will need to sign an ABN (Advanced Beneficiary Notification), which is an acknowledgement that you may be billed for these services if they are not covered by Medicare.    Commonly recommended preventive services include:    Pneumococcal vaccine - covered if you are over 64 or high risk    Influenza vaccine - covered     Hepatitis B vaccine - covered if medium/high risk (end-stage renal disease, hemophiliacs who received Factor VIII or IX concentrates, clients of institutions for the mentally retarded, persons who live in the same house as a HepB virus carrier, homosexual men, illicit injectable drug abusers)    Screening mammography - covered once a year over age 58.     Screening pap smear and pelvic exam - covered once every two years    Colorectal cancer screening - stool test is covered yearly, sigmoidoscopy every 4 years, colonoscopy  every 10 years.    Screening for diabetes - covered if high risk (high blood pressure, high lipids, obesity (BMI>30), history of abnormal glucose test) or over 65 and one other risk factor: overweight (BMI 25 to 30), family history of diabetes, history of gestational diabetes or baby >9#, or certain medications (steroids).    Diabetes self management training    Bone densitometry screening - covered once every 2 yrs over age 38    Screening for glaucoma - covered yearly if diabetic, family history of glaucoma, or African American over age 34.    Nutrition counseling    Medical nutrition therapy if you have diabetes or  kidney disease    Cardiovascular screening blood tests - cholesterol screening covered every 5 years.    Screening EKG    I recommend you consider: Cardiovascular screening blood tests

## 2010-11-12 NOTE — Progress Notes (Signed)
Why is patient here today? Patient is here for annual physical.    Any new significant medical diagnoses since last visit? NO    Refills? NO    Referral? NO    Letter or form? NO    Lab results? NO    Health maintenance issues? PAP

## 2010-11-15 ENCOUNTER — Ambulatory Visit (HOSPITAL_BASED_OUTPATIENT_CLINIC_OR_DEPARTMENT_OTHER): Payer: Medicare Other | Attending: Psychiatry | Admitting: Psychiatry

## 2010-11-15 DIAGNOSIS — F509 Eating disorder, unspecified: Secondary | ICD-10-CM | POA: Insufficient documentation

## 2010-11-15 DIAGNOSIS — F3289 Other specified depressive episodes: Secondary | ICD-10-CM | POA: Insufficient documentation

## 2010-11-15 DIAGNOSIS — F325 Major depressive disorder, single episode, in full remission: Secondary | ICD-10-CM | POA: Insufficient documentation

## 2010-11-15 LAB — CERVICAL CANCER SCREENING: Cytologic Impression: UNDETERMINED — AB

## 2010-11-18 LAB — HPV ONLY

## 2010-11-19 ENCOUNTER — Telehealth (INDEPENDENT_AMBULATORY_CARE_PROVIDER_SITE_OTHER): Payer: Self-pay | Admitting: Medical

## 2010-11-19 DIAGNOSIS — R87619 Unspecified abnormal cytological findings in specimens from cervix uteri: Secondary | ICD-10-CM | POA: Insufficient documentation

## 2010-11-19 NOTE — Telephone Encounter (Signed)
I reached pt on cell and scheduled colposcopy next Tuesday 5/22 @ 11:00a with Dr.Lorentz.

## 2010-11-19 NOTE — Telephone Encounter (Signed)
Spoke with patient about pap results and explained colpo   She will schedule for colpo

## 2010-11-19 NOTE — H&P (Signed)
NAMEGERMANI, Angelica Elliott                 ACCOUNT NO.:  [id number]   MEDICAL RECORD NO.:  [id number]         PATIENT TYPE:  WINP   LOCATION:                                FACILITY:  [facility]   PHYSICIAN:  Sherron Monday, MD        DATE OF BIRTH:  [birth year]   DATE OF ADMISSION:  01/22/2009  DATE OF DISCHARGE:                              HISTORY & PHYSICAL   ADMISSION DIAGNOSES:  1. Morbid obesity.  2. Irregular vaginal bleeding.  3. Polyp on endometrial biopsy.  4. Desires Mirena for control of her irregular bleeding.   PROCEDURE:  Planned hysteroscopy, D and C, polypectomy, Mirena  placement.   HISTORY OF PRESENT ILLNESS:  A 35 year old, G2, P2-0-0-2, who presents  to the office for evaluation of irregular bleeding.  An endometrial  biopsy was performed with difficulty and revealed fragments of a polyp  with local disordered proliferative pattern.  No atypia or malignancy.  She is moving into [place] soon as well.   Her past medical history is significant for asthma, bronchitis, morbid  obesity, diabetes mellitus, migraines, and sleep apnea.   Surgical history is significant for 3 hernia repairs as well as 2 C-  sections and a tubal, tonsillectomy and adenoidectomy, cholecystectomy,  and appendectomy.   PAST OB/GYN HISTORY:  G2, P2-0-0-2.  G1 was a low transverse cesarean  section for failure to progress.  G2 was a repeat low transverse  cesarean section and tubal ligation, pregnancy complicated by her morbid  obesity as well as preeclampsia.  History of an abnormal Pap smear that  resolved with followup and has been normal since.  In 2008, she had a  Pap smear with negative high and low-risk HPV but was within normal  limits.  She has a history of herpes with occasional outbreaks.  No  other sexually transmitted diseases.  She has irregular menses and uses  a tubal for contraception.  No dyspareunia.   Medications include Diovan HCT, Relpax, valacyclovir for suppression,  ProAir.   Allergies include PENICILLIN and AMOXICILLIN.   SOCIAL HISTORY:  Denies tobacco.  Occasional alcohol use.  No other drug  use.  She is single and a homemaker.   Family history is significant for a stroke in the maternal grandfather  as well as hypertension in the same grandfather.  Diabetes is widespread  throughout her family.  Some kind of female cancer in maternal  grandmother, and mother with coronary artery disease.   PHYSICAL EXAMINATION:  GENERAL:  No apparent distress.  CARDIOVASCULAR:  Regular rate and rhythm.  LUNGS:  Clear to auscultation bilaterally.  ABDOMEN:  Soft and morbidly obese. A well-healed vertical incision from  her cesarean section.  PELVIC:  Vaginal exam reveals normal external female genitalia, normal  Bartholin's, urethral, and Skene.  Good support.  Cervix and vagina  without lesions.  Somewhat redundant vaginal tissue.  No cervical motion  tenderness.  Exam is limited by her morbid obesity.  She had a recent  ultrasound that revealed normal uterus and normal ovaries with a simple  cyst on her left ovary.  ASSESSMENT AND PLAN:  A 35 year old, gravida 2, para 2, with an  endometrial polyp for hysteroscopy, D and C, polypectomy, as well as a  Mirena placement.  I discussed with the patient risks, benefits, and  alternatives of the surgical procedure.  She voices understanding and  wishes to proceed.      Sherron Monday, MD  Electronically Signed     JB/MEDQ  D:  01/18/2009  T:  01/19/2009  Job:  [id number]

## 2010-11-19 NOTE — Op Note (Signed)
NAMEELEORA, SUTHERLAND                 ACCOUNT NO.:  [id number]   MEDICAL RECORD NO.:  [id number]          PATIENT TYPE:  AMB   LOCATION:  [facility]                           FACILITY:  [facility]   PHYSICIAN:  Sherron Monday, MD        DATE OF BIRTH:  [birth year]   DATE OF PROCEDURE:  01/22/2009  DATE OF DISCHARGE:  01/22/2009                               OPERATIVE REPORT   PREOPERATIVE DIAGNOSES:  Menorrhagia from endometrial polyp on  endometrial biopsy, morbid obesity.   POSTOPERATIVE DIAGNOSES:  Menorrhagia from endometrial polyp on  endometrial biopsy, morbid obesity.   PROCEDURE:  Diagnostic hysteroscopy, dilation and curettage, Mirena  intrauterine device insertion.   SURGEON:  Sherron Monday, MD   ASSISTANT:  None.   ANESTHESIA:  MAC with 20 mL of 1% lidocaine for local anesthesia.   FINDINGS:  Uterus sounds to 10 cm.   SPECIMENS:  Endometrial curettings to Pathology.   ESTIMATED BLOOD LOSS:  Minimal.   IV FLUIDS:  800 mL.   URINE OUTPUT:  I and O cath prior to the procedure.   Procedure was complicated by the patient's morbid obesity.   DISPOSITION:  Stable to PACU.   PROCEDURE:  After informed consent was reviewed with the patient  including risks, benefits, and alternatives, she was transported to the  OR, placed on the table in supine position.  MAC was introduced and  found to be adequate.  She was then placed in the bariatric Yellofin  stirrups, prepped and draped in the normal sterile fashion.  Her bladder  was sterilely drained with difficulty.  A long speculum was used to  visualize her cervix after a bimanual exam to localize the position.  Using a sidewall retractor, her cervix was visualized well.  It was  grasped with a single-tooth tenaculum, 20 mL of 1% lidocaine was infused  for a paracervical block.  The cervix was dilated to accommodate the  diagnostic hysteroscope.  Uterine sounding revealed a uterine cavity of  10 cm.  The diagnostic hysteroscopy was limited by  the patient's morbid  obesity.  Fluffy endometrium was noted throughout.  D and C performed,  some tissue  was removed.  Mirena IUD was placed in the typical fashion.  Strings  were trimmed to approximately 2-3 cm.  The single-tooth tenaculum was  removed.  Hemostasis was noted.  The patient was returned in supine  position to the stretcher and transported to the PACU in stable  condition.      Sherron Monday, MD  Electronically Signed     JB/MEDQ  D:  01/22/2009  T:  01/23/2009  Job:  [id number]

## 2010-11-20 ENCOUNTER — Telehealth (INDEPENDENT_AMBULATORY_CARE_PROVIDER_SITE_OTHER): Payer: Self-pay | Admitting: Wound Care

## 2010-11-20 NOTE — Telephone Encounter (Signed)
CONFIRMED PHONE NUMBER: [phone]  CALLERS FIRST AND LAST NAME: Pershing Proud  FACILITY NAME: na TITLE: na  CALLERS RELATIONSHIP:Self  RETURN CALL: General message OK    SUBJECT: Appointment Request   REASON FOR REQUEST: Patient has appointment with medical assistant on 11/29/10 for a B12 shot. Patient is requesting if she can have that appointment moved to or get the shot during her appointment on  11/26/10 Dr. Wilkie Aye.     REQUEST APPOINTMENT WITH: Medical assistant  REFERRING PROVIDER: na  REQUESTED DATE: 11/26/10  REQUESTED TIME: before or during patient's appointment with Dr. Eusebio Me TO APPOINT: Schedule appears full

## 2010-11-20 NOTE — Telephone Encounter (Signed)
Switched

## 2010-11-21 ENCOUNTER — Telehealth (INDEPENDENT_AMBULATORY_CARE_PROVIDER_SITE_OTHER): Payer: Self-pay | Admitting: Wound Care

## 2010-11-21 NOTE — Telephone Encounter (Signed)
Prior Authorization required for Relpax 40 mg  PA department: [facility]  Phone number: [phone]  Insurance ID: [id number]  Pharmacy: [facility]  Pharmacy phone #: [phone]    PA started.     Please keep TE open.

## 2010-11-22 ENCOUNTER — Ambulatory Visit (INDEPENDENT_AMBULATORY_CARE_PROVIDER_SITE_OTHER): Payer: Medicare Other | Admitting: Family Medicine

## 2010-11-22 ENCOUNTER — Ambulatory Visit (HOSPITAL_BASED_OUTPATIENT_CLINIC_OR_DEPARTMENT_OTHER): Payer: Medicare Other | Attending: Psychiatry | Admitting: Psychiatry

## 2010-11-22 VITALS — BP 130/86 | HR 78 | Temp 98.1°F | Wt 396.0 lb

## 2010-11-22 DIAGNOSIS — S20219A Contusion of unspecified front wall of thorax, initial encounter: Secondary | ICD-10-CM

## 2010-11-22 DIAGNOSIS — F509 Eating disorder, unspecified: Secondary | ICD-10-CM | POA: Insufficient documentation

## 2010-11-22 DIAGNOSIS — F3289 Other specified depressive episodes: Secondary | ICD-10-CM | POA: Insufficient documentation

## 2010-11-22 MED ORDER — HYDROCODONE-ACETAMINOPHEN 5-500 MG OR TABS
ORAL_TABLET | ORAL | Status: DC
Start: 2010-11-22 — End: 2010-12-01

## 2010-11-22 NOTE — Progress Notes (Signed)
Lost 151# in past year  Bypass 12/11, lost 100# since then  More active,  Was playing bball yesterday and   Subjective: Patient presents for evaluation of Right rib pain since falling yesterday. She was playing basketball and tripped and landed on her right side. Since then she's been having significant pain along her ribs. It's sore to take a deep breath, cough or sneeze. She does not feel particularly out of breath. She has had a rib fracture in the past and this feels fairly similar.    Patient Active Problem List   Diagnoses   . BENIGN HYPERTENSION   . MIGRAINE NOS INTRACTABLE   . MORBID OBESITY   . CHEST PAIN NOS   . NONSPEC ABNL PAP SMEAR CERVIX, UNSPEC     Current Outpatient Prescriptions   Medication Sig   . Bisacodyl 5 MG Oral Tab EC one tablet daily for constipation   . BuPROPion HCl (WELLBUTRIN SR) 200 MG OR TB12 400 mg a day two tablet twice a day    . Calcium Carbonate (CALCIUM 500) 1250 MG Oral Tab one tablet three times daily   . Cholecalciferol (VITAMIN D) 2000 UNIT OR CAPS one tablet daily    . Docusate Sodium (COLACE) 100 MG Oral Cap Take 1 capsule 2 times per day for constipation   . Eletriptan Hydrobromide (RELPAX) 40 MG Oral Tab take one tablet by mouth at onset of headache    . HYDROCODONE-APAP (VICODIN) 5-500 MG Oral Tab Take 1 to 2  tablets every 4 to 6 hours as needed for pain up to 8 tablets per day   . Pediatric Multivitamins-Iron (ANIMAL CHEWABLE MULTIPLE VIT) Oral Chew Tab one chewable vitamin daily   . Prochlorperazine Maleate 10 MG Oral Tab take one tablet by mouth every 6 hours as needed for nausea/headache     History     Social History   . Marital Status: Single     Spouse Name: N/A     Number of Children: N/A   . Years of Education: N/A     Occupational History   . Not on file.     Social History Main Topics   . Smoking status: Former Games developer   . Smokeless tobacco: Not on file   . Alcohol Use: No   . Drug Use: No   . Sexually Active: Yes -- Female partner(s)     Other Topics Concern      . Not on file     Social History Narrative    Lives in [place] with friends and children. Moved here to "get aware from my kid's father". Denies hx of abuse. Former smoker, denies TED at current. Unemployed.     ROS: Denies abdominal pain, vomiting, head injury    Objective: Pleasant woman in NAD  Heart RRR  Lungs clear  Tenderness over right lower ribs in the midaxillary line.    922.1 CONTUSION OF CHEST WALL  (primary encounter diagnosis)  Comment: Contusion of the right chest wall with possible rib fracture. Reviewed with patient that this is any slow recovery process and that symptoms will probably persist for several weeks. She can use anti-inflammatories and for the next couple of days Vicodin maybe helpful for sleeping. She should followup if symptoms are increasing or not improving over several weeks.

## 2010-11-22 NOTE — Progress Notes (Signed)
Why is patient here today? Patient is here c/o pain in her side/ribs on the R side after falling yesterday PM.   Does patient need refills? no  Does patient need a referral? no  Does patient need a letter or need forms filled out? no  Lab results? no  Please check health mainenance issues and flag for the doctor: none

## 2010-11-26 ENCOUNTER — Other Ambulatory Visit (INDEPENDENT_AMBULATORY_CARE_PROVIDER_SITE_OTHER): Payer: Medicare Other

## 2010-11-26 ENCOUNTER — Ambulatory Visit (INDEPENDENT_AMBULATORY_CARE_PROVIDER_SITE_OTHER): Payer: Self-pay | Admitting: Obstetrics & Gynecology

## 2010-11-26 ENCOUNTER — Telehealth (INDEPENDENT_AMBULATORY_CARE_PROVIDER_SITE_OTHER): Payer: Self-pay | Admitting: Wound Care

## 2010-11-26 DIAGNOSIS — I10 Essential (primary) hypertension: Secondary | ICD-10-CM

## 2010-11-26 NOTE — Progress Notes (Signed)
Patient arrived 20 min late for 11/26/10 colposcopy, rescheduled for next week 12/05/10.

## 2010-11-26 NOTE — Telephone Encounter (Signed)
Called patient and left a voice message to return to clinic for another blood draw.    Called cell number

## 2010-11-27 ENCOUNTER — Ambulatory Visit (INDEPENDENT_AMBULATORY_CARE_PROVIDER_SITE_OTHER): Payer: Medicare Other | Admitting: Nurse Practitioner

## 2010-11-27 ENCOUNTER — Encounter (INDEPENDENT_AMBULATORY_CARE_PROVIDER_SITE_OTHER): Payer: Self-pay | Admitting: Nurse Practitioner

## 2010-11-27 VITALS — BP 128/64 | HR 78 | Temp 98.6°F | Resp 16 | Wt 389.6 lb

## 2010-11-27 DIAGNOSIS — E539 Vitamin B deficiency, unspecified: Secondary | ICD-10-CM

## 2010-11-27 DIAGNOSIS — S20219A Contusion of unspecified front wall of thorax, initial encounter: Secondary | ICD-10-CM

## 2010-11-27 MED ORDER — HYDROCODONE-ACETAMINOPHEN 5-500 MG OR TABS
ORAL_TABLET | ORAL | Status: DC
Start: 2010-11-27 — End: 2010-12-23

## 2010-11-27 MED ORDER — NAPROXEN 500 MG OR TABS
ORAL_TABLET | ORAL | Status: DC
Start: 2010-11-27 — End: 2011-03-21

## 2010-11-27 NOTE — Progress Notes (Signed)
Sharon Stephens  is a 35 year old  old female  who presents with the following concerns:    Rib pain, B12 shot    HPI: Saw Dr. Dr. Gearldine Shown after right rib contusion  injured playing basket ball in fall. Pt was using Tylenol and Motrin switching and vicodin but pain continues.    --Location: left ribs  Quality: sharp  Severity: 7-8/10  Duration: since last visit  Timing: no change  Context: per above  Modifying factors:  Made better by vicodin, made worse by sleeping, moving, taking care of kids  Associated signs and symptoms:  History of asthma in [place]. No SABA. Walking since accident. No swimming.    Bariatric surgery at [facility], [facility], Family history, social history reviewed and amended as appropriate (please see history tabs in EMR.)    Patient Active Problem List   Diagnoses Date Noted   . NONSPEC ABNL PAP SMEAR CERVIX, UNSPEC [795.00] 11/19/2010     ASCUS +HPV     . MORBID OBESITY [278.01] 12/14/2009   . CHEST PAIN NOS [786.50] 12/14/2009     Non cardiac.      . BENIGN HYPERTENSION [401.1] 10/17/2009   . MIGRAINE NOS INTRACTABLE [346.91] 10/17/2009       History     Social History   . Marital Status: Single     Spouse Name: N/A     Number of Children: N/A   . Years of Education: N/A     Social History Main Topics   . Smoking status: Former Games developer   . Smokeless tobacco: Not on file   . Alcohol Use: No   . Drug Use: No   . Sexually Active: Yes -- Female partner(s)     Other Topics Concern   . Not on file     Social History Narrative    Lives in [place] with friends and children. Moved here to "get aware from my kid's father". Denies hx of abuse. Former smoker, denies TED at current. Unemployed.       review of systems:  constitutional Positive for  Expected weight loss  cardiovascular Negative   respiratory Negative   musculoskeletal As noted in HPI above  psychiatric Negative       OBJECTIVE    BP 128/64  Pulse 78  Temp(Src) 98.6 F (37 C) (Temporal)  Resp 16  Wt 389 lb 9.6 oz (176.721 kg)  SpO2 100%   GEN:   Morbidly obese, Alert, pleasant, NAD  PULM:  CTA bilaterally, good effort  CV:  RRR w/o murmur appreciated   MS:  Tender right 6-7th lateral rib area  SKIN:  no rashes or worrisome lesions, warm and dry      Assessment and Plan:     922.1 CONTUSION OF CHEST WALL  (primary encounter diagnosis)  Comment: persisting  Plan: HYDROCODONE-APAP (VICODIN) 5-500 MG Oral Tab,         Naproxen 500 MG Oral Tab        Will add regular NSAID and refilled vicodin. Local heat might be helpful. Consider local lidoderm patch or Voltarin gel if covered to avoid opioids.    266.9 VITAMIN B DEFICIENCY NOS  Comment: due  Plan: VITAMIN B12 INJECTION        given

## 2010-11-27 NOTE — Progress Notes (Signed)
Why is patient here today? Patient being seen for possible rib fracture on R side from a fall on 5/17. She was seen by Dr. Gearldine Shown on 5/18 for same issue.  She is requesting refills on pain medication and vitamin B12 injection.    Any new significant medical diagnoses since last visit? NO    Refills? YES: Hydrocodone    Referral? NO    Letter or form? NO    Lab results? NO    Health maintenance issues? NO

## 2010-11-28 ENCOUNTER — Ambulatory Visit (INDEPENDENT_AMBULATORY_CARE_PROVIDER_SITE_OTHER): Payer: Medicare Other | Admitting: Wound Care

## 2010-11-29 ENCOUNTER — Ambulatory Visit (HOSPITAL_BASED_OUTPATIENT_CLINIC_OR_DEPARTMENT_OTHER): Payer: Medicare Other | Attending: Psychiatry | Admitting: Psychiatry

## 2010-11-29 ENCOUNTER — Ambulatory Visit (INDEPENDENT_AMBULATORY_CARE_PROVIDER_SITE_OTHER): Payer: Medicare Other

## 2010-11-29 ENCOUNTER — Encounter (INDEPENDENT_AMBULATORY_CARE_PROVIDER_SITE_OTHER): Payer: Self-pay | Admitting: Family Medicine

## 2010-11-29 ENCOUNTER — Ambulatory Visit (INDEPENDENT_AMBULATORY_CARE_PROVIDER_SITE_OTHER): Payer: Medicare Other | Admitting: Family Medicine

## 2010-11-29 ENCOUNTER — Other Ambulatory Visit (INDEPENDENT_AMBULATORY_CARE_PROVIDER_SITE_OTHER): Payer: Medicare Other

## 2010-11-29 VITALS — BP 130/90 | HR 87 | Temp 98.5°F | Wt 387.0 lb

## 2010-11-29 DIAGNOSIS — N390 Urinary tract infection, site not specified: Secondary | ICD-10-CM

## 2010-11-29 DIAGNOSIS — R300 Dysuria: Secondary | ICD-10-CM

## 2010-11-29 DIAGNOSIS — F509 Eating disorder, unspecified: Secondary | ICD-10-CM | POA: Insufficient documentation

## 2010-11-29 DIAGNOSIS — F325 Major depressive disorder, single episode, in full remission: Secondary | ICD-10-CM | POA: Insufficient documentation

## 2010-11-29 DIAGNOSIS — F3289 Other specified depressive episodes: Secondary | ICD-10-CM | POA: Insufficient documentation

## 2010-11-29 LAB — PR U/A AUTO W/MICRO, ONSITE
Casts, URN: NEGATIVE /LPF
Crystals, URN: NEGATIVE /LPF
Other: NEGATIVE

## 2010-11-29 MED ORDER — NITROFURANTOIN MONOHYD MACRO 100 MG OR CAPS
ORAL_CAPSULE | ORAL | Status: AC
Start: 2010-11-29 — End: 2010-12-05

## 2010-11-29 NOTE — Progress Notes (Signed)
SUBJECTIVE: Sharon Stephens is a 35 year old female who complains of urinary frequency, urgency and dysuria x 1 days, without flank pain, denies fever, chills, or abnormal vaginal discharge or bleeding.     Patient Active Problem List   Diagnoses   . BENIGN HYPERTENSION   . MIGRAINE NOS INTRACTABLE   . MORBID OBESITY   . CHEST PAIN NOS   . NONSPEC ABNL PAP SMEAR CERVIX, UNSPEC     Current Outpatient Prescriptions   Medication Sig   . Bisacodyl 5 MG Oral Tab EC one tablet daily for constipation   . BuPROPion HCl (WELLBUTRIN SR) 200 MG OR TB12 400 mg a day two tablet twice a day    . Calcium Carbonate (CALCIUM 500) 1250 MG Oral Tab one tablet three times daily   . Cholecalciferol (VITAMIN D) 2000 UNIT OR CAPS one tablet daily    . Docusate Sodium (COLACE) 100 MG Oral Cap Take 1 capsule 2 times per day for constipation   . Eletriptan Hydrobromide (RELPAX) 40 MG Oral Tab take one tablet by mouth at onset of headache    . HYDROCODONE-APAP (VICODIN) 5-500 MG Oral Tab Take 1 to 2  tablets every 4 to 6 hours as needed for pain up to 8 tablets per day   . DISCONTD: HYDROCODONE-APAP (VICODIN) 5-500 MG Oral Tab Take 1 to 2  tablets every 4 to 6 hours as needed for pain up to 8 tablets per day   . Naproxen 500 MG Oral Tab Take 1 tablet by mouth 2 times per day as with food as needed for pain   . Pediatric Multivitamins-Iron (ANIMAL CHEWABLE MULTIPLE VIT) Oral Chew Tab one chewable vitamin daily   . Prochlorperazine Maleate 10 MG Oral Tab take one tablet by mouth every 6 hours as needed for nausea/headache     History     Social History   . Marital Status: Single     Spouse Name: N/A     Number of Children: N/A   . Years of Education: N/A     Occupational History   . Not on file.     Social History Main Topics   . Smoking status: Former Games developer   . Smokeless tobacco: Not on file   . Alcohol Use: No   . Drug Use: No   . Sexually Active: Yes -- Female partner(s)     Other Topics Concern   . Not on file     Social History Narrative       Lives in [place] with friends and children. Moved here to "get aware from my kid's father". Denies hx of abuse. Former smoker, denies TED at current. Unemployed.       Ros: denies other illness    OBJECTIVE: Appears well, in no apparent distress.  Vital signs are normal.     Urine dipstick: consistent with UTI.      ASSESSMENT: UTI uncomplicated without evidence of pyelonephritis    PLAN: Treatment per orders - also push fluids, may use Pyridium OTC prn. Call or return to clinic prn if these symptoms worsen or fail to improve as anticipated.

## 2010-11-29 NOTE — Progress Notes (Signed)
Why is patient here today? Patient is here c/o continued bladder spasms and pain when going to the bathroom.  Does patient need refills? no  Does patient need a referral? no  Does patient need a letter or need forms filled out? no  Lab results? no  Please check health mainenance issues and flag for the doctor: none

## 2010-11-29 NOTE — Progress Notes (Signed)
Addended by: Loura Halt on: 11/29/2010 11:50 AM     Modules accepted: Orders

## 2010-12-01 LAB — URINE C/S: Colony Count: 8500

## 2010-12-04 ENCOUNTER — Other Ambulatory Visit (EMERGENCY_DEPARTMENT_HOSPITAL): Payer: Self-pay

## 2010-12-04 ENCOUNTER — Inpatient Hospital Stay (HOSPITAL_BASED_OUTPATIENT_CLINIC_OR_DEPARTMENT_OTHER)
Admission: EM | Admit: 2010-12-04 | Discharge: 2010-12-16 | DRG: 881 | Disposition: A | Discharge status: Home / Self Care | Payer: Medicare Other | Attending: Psychiatry | Admitting: Psychiatry

## 2010-12-04 ENCOUNTER — Inpatient Hospital Stay (HOSPITAL_COMMUNITY): Payer: Medicare Other | Admitting: Psychiatry

## 2010-12-04 DIAGNOSIS — F3289 Other specified depressive episodes: Secondary | ICD-10-CM

## 2010-12-04 DIAGNOSIS — Z881 Allergy status to other antibiotic agents status: Secondary | ICD-10-CM

## 2010-12-04 DIAGNOSIS — M1990 Unspecified osteoarthritis, unspecified site: Secondary | ICD-10-CM | POA: Diagnosis present

## 2010-12-04 DIAGNOSIS — B356 Tinea cruris: Secondary | ICD-10-CM | POA: Diagnosis present

## 2010-12-04 DIAGNOSIS — F39 Unspecified mood [affective] disorder: Secondary | ICD-10-CM

## 2010-12-04 DIAGNOSIS — E669 Obesity, unspecified: Secondary | ICD-10-CM

## 2010-12-04 DIAGNOSIS — Z880 Allergy status to penicillin: Secondary | ICD-10-CM

## 2010-12-04 LAB — HEPATIC FUNCTION PANEL
ALT (GPT): 14 U/L (ref 6–40)
AST (GOT): 19 U/L (ref 15–40)
Albumin: 3.4 g/dL — ABNORMAL LOW (ref 3.5–5.2)
Alkaline Phosphatase (Total): 53 U/L (ref 25–100)
Bilirubin (Direct): 0.2 mg/dL (ref 0.0–0.3)
Bilirubin (Total): 0.7 mg/dL (ref 0.2–1.3)
Protein (Total): 6.3 g/dL (ref 6.0–8.2)

## 2010-12-04 LAB — BASIC METABOLIC PANEL
Anion Gap: 9 (ref 3–11)
Calcium: 9.1 mg/dL (ref 8.9–10.2)
Carbon Dioxide, Total: 24 mEq/L (ref 22–32)
Chloride: 105 mEq/L (ref 98–108)
Creatinine: 0.75 mg/dL (ref 0.38–1.02)
GFR, Calc, African American: >60 mL/min (ref >59)
GFR, Calc, European American: >60 mL/min (ref >59)
Glucose: 124 mg/dL (ref 62–125)
Potassium: 3.5 mEq/L — ABNORMAL LOW (ref 3.7–5.2)
Sodium: 138 mEq/L (ref 136–145)
Urea Nitrogen: 9 mg/dL (ref 8–21)

## 2010-12-04 LAB — CBC (HEMOGRAM)
Hematocrit: 39 % (ref 36–45)
Hemoglobin: 12.6 g/dL (ref 11.5–15.5)
MCH: 28.1 pg (ref 27.3–33.6)
MCHC: 32.3 g/dL (ref 32.2–36.5)
MCV: 87 fL (ref 81–98)
Platelet Count: 225 10*3/uL (ref 150–400)
RBC: 4.48 mil/uL (ref 3.80–5.00)
RDW-CV: 14.4 % (ref 11.6–14.4)
WBC: 7.29 10*3/uL (ref 4.30–10.00)

## 2010-12-04 LAB — PREGNANCY (HCG), SERUM, QUANT
Pregnancy (HCG), SRM: <1 m[IU]/mL (ref <6)
Pregnancy (HCG), SRM: NEGATIVE

## 2010-12-04 LAB — PROTHROMBIN & PTT
Partial Thromboplastin Time: 29 s (ref 22–35)
Prothrombin INR: 1 (ref 0.8–1.3)
Prothrombin Time Patient: 12.4 s (ref 10.7–15.6)

## 2010-12-04 LAB — SALICYLATE: Salicylate: <4 mg/dL — ABNORMAL LOW (ref 5–30)

## 2010-12-04 LAB — ALCOHOL (ETHYL): Alcohol (Ethyl): NEGATIVE mg/dL

## 2010-12-04 LAB — 1ST EXTRA LIME GREEN TOP

## 2010-12-04 LAB — ACETAMINOPHEN (TYLENOL): Acetaminophen (Tylenol): <10 ug/mL — ABNORMAL LOW (ref 10–25)

## 2010-12-05 ENCOUNTER — Telehealth (INDEPENDENT_AMBULATORY_CARE_PROVIDER_SITE_OTHER): Payer: Self-pay | Admitting: Wound Care

## 2010-12-05 ENCOUNTER — Ambulatory Visit (INDEPENDENT_AMBULATORY_CARE_PROVIDER_SITE_OTHER): Payer: Self-pay | Admitting: Obstetrics & Gynecology

## 2010-12-05 ENCOUNTER — Other Ambulatory Visit (INDEPENDENT_AMBULATORY_CARE_PROVIDER_SITE_OTHER): Payer: Self-pay

## 2010-12-05 NOTE — Telephone Encounter (Signed)
Noted, thanks    Can we follow up with this patient next week to check in and make sure she is doing ok?  Also, to reschedule her colpo?

## 2010-12-05 NOTE — Telephone Encounter (Signed)
Yes, forward TE to Monday so we can follow up with patient.

## 2010-12-05 NOTE — Telephone Encounter (Signed)
fyi to provider

## 2010-12-05 NOTE — Telephone Encounter (Signed)
CONFIRMED PHONE NUMBER: n/a  CALLERS FIRST AND LAST NAME: Pershing Proud  FACILITY NAME:  TITLE:   CALLERS RELATIONSHIP:self  RETURN CALL: Detailed message on voicemail only  SUBJECT: General Message   REASON FOR REQUEST:   MESSAGE: Pt stated she needed to cancel her procedure this morning because she is at [facility] ER for depression.  FORWARD TO: Clinic Staff

## 2010-12-06 ENCOUNTER — Other Ambulatory Visit (HOSPITAL_COMMUNITY): Payer: Self-pay | Admitting: Psychiatry

## 2010-12-06 ENCOUNTER — Encounter (HOSPITAL_BASED_OUTPATIENT_CLINIC_OR_DEPARTMENT_OTHER): Payer: Medicare Other | Admitting: Psychiatry

## 2010-12-06 DIAGNOSIS — F609 Personality disorder, unspecified: Secondary | ICD-10-CM

## 2010-12-06 DIAGNOSIS — F3289 Other specified depressive episodes: Secondary | ICD-10-CM

## 2010-12-06 LAB — URINALYSIS COMPLETE, URN
Bacteria, URN: NONE SEEN
Bilirubin (Qual), URN: NEGATIVE
Epith Cells_Renal/Trans,URN: NEGATIVE /HPF
Glucose Qual, URN: NEGATIVE mg/dL
Ketones, URN: NEGATIVE mg/dL
Leukocyte Esterase, URN: NEGATIVE
Nitrite, URN: NEGATIVE
Occult Blood, URN: NEGATIVE
Protein (Alb Semiquant), URN: NEGATIVE mg/dL
RBC, URN: NEGATIVE /HPF
Specific Gravity, URN: 1.014 g/mL (ref 1.002–1.027)
WBC, URN: NEGATIVE /HPF
pH, URN: 7 (ref 5.0–8.0)

## 2010-12-06 LAB — PREGNANCY TEST, URINE
Pregnancy Test, URN: NEGATIVE
Specific Gravity, URN: 1.014 g/mL (ref 1.002–1.027)

## 2010-12-06 LAB — STANDARD DRUG SCREEN, URN
Acetaminophen Qualitative, URN: NEGATIVE
Alcohol (Ethyl), URN: NEGATIVE mg/dL
Amphet/Methamphetamine Qual,URN: NEGATIVE
Barbiturate (Qual), URN: NEGATIVE
Benzodiazepines (Qual), URN: NEGATIVE
Cannabinoids (Qual), URN: NEGATIVE
Cocaine (Qual), URN: NEGATIVE
Drug Screen Test Info, URN: POSITIVE
Drug Screen Test Info, URN: POSITIVE
Methadone (Qual), URN: NEGATIVE
Opiates (Qual), URN: NEGATIVE
Phencyclidine (Qual), URN: NEGATIVE
Tricyclic Antidepressants, URN: NEGATIVE

## 2010-12-06 LAB — LIPID PANEL
Cholesterol (LDL): 112 mg/dL (ref <130)
Cholesterol/HDL Ratio: 4.7
HDL Cholesterol: 37 mg/dL — ABNORMAL LOW (ref >40)
Non-HDL Cholesterol: 138 mg/dL (ref 0–159)
Total Cholesterol: 175 mg/dL (ref <200)
Triglyceride: 130 mg/dL (ref <150)

## 2010-12-06 LAB — 1ST EXTRA MISC SPECIMEN

## 2010-12-06 LAB — GLUCOSE SERUM, NONFASTING: Glucose: 91 mg/dL (ref 62–125)

## 2010-12-07 ENCOUNTER — Other Ambulatory Visit (HOSPITAL_COMMUNITY): Payer: Self-pay | Admitting: Psychiatry

## 2010-12-07 LAB — STANDARD DRUG SCREEN, URN
Acetaminophen Qualitative, URN: NEGATIVE
Alcohol (Ethyl), URN: NEGATIVE mg/dL
Amphet/Methamphetamine Qual,URN: NEGATIVE
Barbiturate (Qual), URN: NEGATIVE
Benzodiazepines (Qual), URN: NEGATIVE
Cannabinoids (Qual), URN: NEGATIVE
Cocaine (Qual), URN: NEGATIVE
Drug Screen Test Info, URN: POSITIVE
Drug Screen Test Info, URN: POSITIVE
Methadone (Qual), URN: NEGATIVE
Opiates (Qual), URN: NEGATIVE
Phencyclidine (Qual), URN: NEGATIVE
Tricyclic Antidepressants, URN: NEGATIVE

## 2010-12-09 ENCOUNTER — Telehealth (INDEPENDENT_AMBULATORY_CARE_PROVIDER_SITE_OTHER): Payer: Self-pay | Admitting: Wound Care

## 2010-12-09 ENCOUNTER — Encounter (HOSPITAL_BASED_OUTPATIENT_CLINIC_OR_DEPARTMENT_OTHER): Payer: Medicare Other | Admitting: Surgery

## 2010-12-09 LAB — HEMOGLOBIN A1C, HPLC: Hemoglobin A1C: 4.9 % (ref 4.0–6.0)

## 2010-12-09 NOTE — Telephone Encounter (Signed)
CONFIRMED PHONE NUMBER: [phone]  CALLERS FIRST AND LAST NAME: Shellee Milo  FACILITY NAME: NA TITLE: NA  CALLERS RELATIONSHIP:Self  RETURN CALL: Detailed message on voicemail only    SUBJECT: Appointment Request   REASON FOR REQUEST: Procedure - Colposcopy    REQUEST APPOINTMENT WITH: Lanice Shirts PROVIDER: Caesar Chestnut  REQUESTED DATE: Any  REQUESTED TIME: Any  UNABLE TO APPOINT: Other: Per SOP

## 2010-12-10 LAB — R/O STAPH AUREUS C/S

## 2010-12-10 NOTE — Telephone Encounter (Signed)
See additional TE. Patient was seen at ER for depression last week. She states she is doing well. Scheduled her for her colposcopy next week in OB/GYN.

## 2010-12-10 NOTE — Telephone Encounter (Signed)
See additional TE from this morning.

## 2010-12-11 NOTE — Telephone Encounter (Signed)
Sent Pt a colposcopy procedure patient education letter via eCare message - Pt scheduled for a colpo with Dr. Wilkie Aye on Thurs 12/19/2010 at 1:00pm.    Pt instructed to call the clinic with any further questions or concerns.    Marval Regal, OB/GYN RN

## 2010-12-13 ENCOUNTER — Encounter (HOSPITAL_BASED_OUTPATIENT_CLINIC_OR_DEPARTMENT_OTHER): Payer: Medicare Other | Admitting: Psychiatry

## 2010-12-17 ENCOUNTER — Ambulatory Visit (HOSPITAL_BASED_OUTPATIENT_CLINIC_OR_DEPARTMENT_OTHER): Payer: Medicare Other | Admitting: Counselor

## 2010-12-17 ENCOUNTER — Ambulatory Visit (HOSPITAL_BASED_OUTPATIENT_CLINIC_OR_DEPARTMENT_OTHER): Payer: Medicare Other | Attending: Psychiatry | Admitting: Psychiatry

## 2010-12-17 DIAGNOSIS — F3289 Other specified depressive episodes: Secondary | ICD-10-CM | POA: Insufficient documentation

## 2010-12-17 DIAGNOSIS — F609 Personality disorder, unspecified: Secondary | ICD-10-CM | POA: Insufficient documentation

## 2010-12-19 ENCOUNTER — Ambulatory Visit (INDEPENDENT_AMBULATORY_CARE_PROVIDER_SITE_OTHER): Payer: Medicare Other | Admitting: Obstetrics & Gynecology

## 2010-12-19 ENCOUNTER — Encounter (INDEPENDENT_AMBULATORY_CARE_PROVIDER_SITE_OTHER): Payer: Self-pay | Admitting: Obstetrics & Gynecology

## 2010-12-19 ENCOUNTER — Other Ambulatory Visit (HOSPITAL_BASED_OUTPATIENT_CLINIC_OR_DEPARTMENT_OTHER)
Admit: 2010-12-19 | Discharge: 2010-12-19 | Disposition: A | Discharge status: Home / Self Care | Payer: Medicare Other | Attending: Obstetrics & Gynecology | Admitting: Obstetrics & Gynecology

## 2010-12-19 VITALS — BP 120/78 | HR 84 | Resp 18 | Wt 387.0 lb

## 2010-12-19 DIAGNOSIS — R87810 Cervical high risk human papillomavirus (HPV) DNA test positive: Secondary | ICD-10-CM

## 2010-12-19 DIAGNOSIS — N879 Dysplasia of cervix uteri, unspecified: Secondary | ICD-10-CM

## 2010-12-19 DIAGNOSIS — N390 Urinary tract infection, site not specified: Secondary | ICD-10-CM

## 2010-12-19 MED ORDER — NITROFURANTOIN MACROCRYSTAL 50 MG OR CAPS
ORAL_CAPSULE | ORAL | Status: DC
Start: 2010-12-19 — End: 2010-12-23

## 2010-12-19 NOTE — Progress Notes (Signed)
Procedure Note: Colposcopy      HPI: Pt is a 35 year old G2 P1 who I was asked to see by Fuller Plan, PA for evaluation of ASCUS HPV + pap     Patient's pap smear history is as follows:  ASCUS HPV +  Prior abn at age 86, no treatment.  Her grandmother died of cervical cancer    HPV vaccination: no      Smoking history: no  LMP no periods with IUD       Birth control method: Mirena IUD  Sexual history:      Lifetime sexual partners: >5  How long with current partner? 10 months     Prior history of STIs: No    Pt reports getting frequent UTIs with intercourse, 4 since February, wonders whether there is something she can do to prevent this.  Presents with burning with urination.         ROS:     Constitutional: Negative    Cardiovascular: Negative    Respiratory: Negative    Genitourinary: frequent UTI    Past Medical History   Diagnosis Date   . DEPRESSIVE DISORDER NEC    . OTHER UNSPEC SLEEP APNEA    . HERNIA NEC      notes having 3 in abdomen    . ALLERGIC RHINITIS NOS    . CHRON OBST ASTHMA WO STATUS ASTHM    . DIABETES UNCOMPL ADULT-TYPE II      broaderline    . ESOPHAGEAL REFLUX    . BENIGN HYPERTENSION      Past Surgical History   Procedure Date   . C-sections    . Gallbladder removed    . Tube tied    . Removal of tonsils    . Appendectomy    . Cesarean delivery only    . Ligate fallopian tube    . Removal gallbladder      Obstetric History    G2   P0   T0   P0   A0   TAB0   SAB0   E0   M0   L2          PHYSICAL EXAMINATION  Filed Vitals:    12/19/10 1240   BP: 120/78   Pulse: 84   Resp: 18   Weight: 387 lb (175.542 kg)     GENERAL: Well developed female in no acute distress.   NEURO: ambulatory, gait normal   PSYCH: alert and oriented x3. Mood/affect appropriate.  RESPIRATORY: Effort normal, no use of accessory muscles.  ABDOMEN:  nontender, morbidly obese  PELVIC: normal appearing external genitalia.  Normal bartholin's, skene's, urethral meatus and anus.  Vagina is rugated and well estrogenized with  redundant vaginal tissue making visualization of the cervix difficult.  Extra large/long speculum used.  Cervix is parous, IUD strings present, no lesions.       PROCEDURE NOTE: Written consent was obtained after ASCCP management guidelines were discussed and all patient's questions were answered. Final verification with two patient identifiers was performed. Urine HCG: declined, has Mirena IUD and s/p BTL  Colposcopic exam of the cervix and upper vagina was performed before and after application of 3% acetic acid.  Colposcopic exam adequate? No, TZ not adequately seen  Acetowhite lesions: no  Punctations: no  Mosaicism: no  Atypical vessels: no    Biopsy not done  ECC brush done      Assessment/Plan  35 year old yo female with a history  of ASCUS HPV +pap, and colposcopy visually consistent with normal cervix.         1. ASCUS HPV + pap smear. Colposcopic impression: normal appearing cervix.  TZ appears to be in cervical canal so brush ECC done.   Will await results.  Follow up discussed with patient: if normal plan for repeat pap in 1 year. Patient counseled on risk reduction and safe sex practices.    2. Recurrent UTIs: discussed options of suppression daily vs postcoital prophalaxis.  Pt prefers postcoital prophalaxis.  Will try macrobid 50mg  with intercourse and see if she notes an improvement.

## 2010-12-19 NOTE — Progress Notes (Signed)
Patient referred by Fuller Plan, PA-C for colposcopy. LMP: IUD. Patient declined hcg due to s/p BTL.    Other concern: c/o four UTI's since February; symptoms keep returning after intercourse.

## 2010-12-23 ENCOUNTER — Ambulatory Visit (INDEPENDENT_AMBULATORY_CARE_PROVIDER_SITE_OTHER): Payer: Medicare Other | Admitting: Wound Care

## 2010-12-23 VITALS — BP 124/68 | HR 82 | Temp 97.7°F | Resp 16 | Wt 387.4 lb

## 2010-12-23 DIAGNOSIS — E539 Vitamin B deficiency, unspecified: Secondary | ICD-10-CM

## 2010-12-23 DIAGNOSIS — S63509A Unspecified sprain of unspecified wrist, initial encounter: Secondary | ICD-10-CM

## 2010-12-23 DIAGNOSIS — M79609 Pain in unspecified limb: Secondary | ICD-10-CM

## 2010-12-23 LAB — PATHOLOGY, SURGICAL

## 2010-12-23 LAB — PR X-RAY WRIST 3+ VW LEFT

## 2010-12-23 MED ORDER — SUMATRIPTAN SUCCINATE 50 MG OR TABS
ORAL_TABLET | ORAL | Status: DC
Start: 2010-12-23 — End: 2011-05-05

## 2010-12-23 MED ORDER — HYDROCODONE-ACETAMINOPHEN 5-500 MG OR TABS
ORAL_TABLET | ORAL | Status: AC
Start: 2010-12-23 — End: 2011-01-01

## 2010-12-23 NOTE — Progress Notes (Signed)
Pt is a 35 year old female  here today for migraines  Reviewed history obtained by MA and agree on 12/23/2010      Migraines. Her insurnce doesn't cover relpax. She needs a substitute. No headache today.    Hurt left wrist. Fell while rollerskating. Has had previous surgery   Unable to move it. Pain 5/10 .     Patient Active Problem List   Diagnoses   . BENIGN HYPERTENSION   . MIGRAINE NOS INTRACTABLE   . MORBID OBESITY   . CHEST PAIN NOS   . NONSPEC ABNL PAP SMEAR CERVIX, UNSPEC     Current Outpatient Prescriptions   Medication Sig   . Bisacodyl 5 MG Oral Tab EC one tablet daily for constipation   . BuPROPion HCl (WELLBUTRIN SR) 200 MG OR TB12 400 mg a day two tablet twice a day    . Calcium Carbonate (CALCIUM 500) 1250 MG Oral Tab one tablet three times daily   . Cholecalciferol (VITAMIN D) 2000 UNIT OR CAPS one tablet daily    . Docusate Sodium (COLACE) 100 MG Oral Cap Take 1 capsule 2 times per day for constipation   . Eletriptan Hydrobromide (RELPAX) 40 MG Oral Tab take one tablet by mouth at onset of headache    . HYDROCODONE-APAP (VICODIN) 5-500 MG Oral Tab Take 1 to 2  tablets every 4 to 6 hours as needed for pain up to 8 tablets per day   . Naproxen 500 MG Oral Tab Take 1 tablet by mouth 2 times per day as with food as needed for pain   . Nitrofurantoin Macrocrystal 50 MG Oral Cap 1 CAPSULE with intercourse for postcoital prophalaxis   . Pediatric Multivitamins-Iron (ANIMAL CHEWABLE MULTIPLE VIT) Oral Chew Tab one chewable vitamin daily   . Prochlorperazine Maleate 10 MG Oral Tab take one tablet by mouth every 6 hours as needed for nausea/headache     History   Substance Use Topics   . Smoking status: Former Games developer   . Smokeless tobacco: Not on file   . Alcohol Use: No         O/e  Gen alert    BP 124/68  Pulse 82  Temp(Src) 97.7 F (36.5 C) (Tympanic)  Resp 16  Wt 387 lb 6.4 oz (175.723 kg)  Left wrist - mild swelling. Scar from previous suicide attempt.  ROM -painful. No erythema.      ASSESMENT AND  PLAN  842.00 SPRAIN OF WRIST NOS  (primary encounter diagnosis)  Comment: no fracture. Use brace. See orders.  Plan: [facility] TO [facility]            729.5 PAIN IN LIMB  Comment: Plan: X-RAY WRIST 3+ VW LEFT, [facility] TO [facility]            278.01 MORBID OBESITY  Comment: monthly B12 injection  Plan: VITAMIN B12 INJECTION        Secondary to gastric surgery    A. Change, migraine medication to Imitrex. She'll call me if this is not covered

## 2010-12-23 NOTE — Progress Notes (Signed)
Patient here today:  1) follow up HA  2) left wrist pain after falling yesterday from skating. Hx wrist break x2      Patient/Parent has reviewed the appropriate VIS and has all questions answered? N/A    Vaccine given today without initial adverse effect. YES    Donnita [place], MA

## 2010-12-24 ENCOUNTER — Encounter (INDEPENDENT_AMBULATORY_CARE_PROVIDER_SITE_OTHER): Payer: Self-pay | Admitting: Obstetrics & Gynecology

## 2010-12-26 ENCOUNTER — Encounter (HOSPITAL_BASED_OUTPATIENT_CLINIC_OR_DEPARTMENT_OTHER): Payer: Medicare Other | Admitting: Psychiatry

## 2010-12-26 ENCOUNTER — Encounter (HOSPITAL_BASED_OUTPATIENT_CLINIC_OR_DEPARTMENT_OTHER): Payer: Medicare Other | Admitting: Counselor

## 2010-12-30 ENCOUNTER — Encounter (HOSPITAL_BASED_OUTPATIENT_CLINIC_OR_DEPARTMENT_OTHER): Payer: Medicare Other | Admitting: Surgery

## 2010-12-31 ENCOUNTER — Inpatient Hospital Stay (HOSPITAL_COMMUNITY): Payer: Medicaid Other | Admitting: Psychiatry

## 2010-12-31 ENCOUNTER — Inpatient Hospital Stay (HOSPITAL_BASED_OUTPATIENT_CLINIC_OR_DEPARTMENT_OTHER)
Admission: EM | Admit: 2010-12-31 | Discharge: 2011-01-07 | DRG: 885 | Payer: Medicare Other | Attending: Psychiatry | Admitting: Psychiatry

## 2010-12-31 ENCOUNTER — Ambulatory Visit (HOSPITAL_BASED_OUTPATIENT_CLINIC_OR_DEPARTMENT_OTHER): Payer: Self-pay | Admitting: Counselor

## 2010-12-31 ENCOUNTER — Emergency Department
Admission: EM | Admit: 2010-12-31 | Discharge: 2010-12-31 | Disposition: A | Discharge status: Home / Self Care | Payer: Medicare Other | Attending: Emergency Medicine | Admitting: Emergency Medicine

## 2010-12-31 DIAGNOSIS — M79609 Pain in unspecified limb: Secondary | ICD-10-CM

## 2010-12-31 DIAGNOSIS — Z609 Problem related to social environment, unspecified: Secondary | ICD-10-CM

## 2010-12-31 DIAGNOSIS — Z5987 Material hardship due to limited financial resources, not elsewhere classified: Secondary | ICD-10-CM

## 2010-12-31 DIAGNOSIS — G43909 Migraine, unspecified, not intractable, without status migrainosus: Secondary | ICD-10-CM | POA: Diagnosis present

## 2010-12-31 DIAGNOSIS — F329 Major depressive disorder, single episode, unspecified: Secondary | ICD-10-CM

## 2010-12-31 DIAGNOSIS — S40019A Contusion of unspecified shoulder, initial encounter: Secondary | ICD-10-CM | POA: Insufficient documentation

## 2010-12-31 DIAGNOSIS — Z9884 Bariatric surgery status: Secondary | ICD-10-CM

## 2010-12-31 DIAGNOSIS — Z5689 Other problems related to employment: Secondary | ICD-10-CM

## 2010-12-31 DIAGNOSIS — M25519 Pain in unspecified shoulder: Secondary | ICD-10-CM | POA: Insufficient documentation

## 2010-12-31 DIAGNOSIS — Z5900 Homelessness unspecified: Secondary | ICD-10-CM

## 2010-12-31 DIAGNOSIS — F332 Major depressive disorder, recurrent severe without psychotic features: Principal | ICD-10-CM | POA: Diagnosis present

## 2010-12-31 DIAGNOSIS — S8000XA Contusion of unspecified knee, initial encounter: Secondary | ICD-10-CM | POA: Insufficient documentation

## 2010-12-31 DIAGNOSIS — I10 Essential (primary) hypertension: Secondary | ICD-10-CM | POA: Diagnosis present

## 2010-12-31 DIAGNOSIS — F1010 Alcohol abuse, uncomplicated: Secondary | ICD-10-CM

## 2010-12-31 DIAGNOSIS — G4733 Obstructive sleep apnea (adult) (pediatric): Secondary | ICD-10-CM | POA: Diagnosis present

## 2010-12-31 DIAGNOSIS — S298XXA Other specified injuries of thorax, initial encounter: Secondary | ICD-10-CM

## 2010-12-31 DIAGNOSIS — F6081 Narcissistic personality disorder: Secondary | ICD-10-CM | POA: Diagnosis present

## 2010-12-31 DIAGNOSIS — Z6841 Body Mass Index (BMI) 40.0 and over, adult: Secondary | ICD-10-CM

## 2010-12-31 DIAGNOSIS — IMO0002 Reserved for concepts with insufficient information to code with codable children: Secondary | ICD-10-CM

## 2010-12-31 DIAGNOSIS — W108XXA Fall (on) (from) other stairs and steps, initial encounter: Secondary | ICD-10-CM | POA: Insufficient documentation

## 2010-12-31 DIAGNOSIS — F602 Antisocial personality disorder: Secondary | ICD-10-CM | POA: Diagnosis present

## 2010-12-31 DIAGNOSIS — Z880 Allergy status to penicillin: Secondary | ICD-10-CM

## 2010-12-31 DIAGNOSIS — M1990 Unspecified osteoarthritis, unspecified site: Secondary | ICD-10-CM | POA: Diagnosis present

## 2010-12-31 DIAGNOSIS — Z881 Allergy status to other antibiotic agents status: Secondary | ICD-10-CM

## 2011-01-01 ENCOUNTER — Encounter (HOSPITAL_BASED_OUTPATIENT_CLINIC_OR_DEPARTMENT_OTHER): Payer: Medicare Other | Admitting: Counselor

## 2011-01-01 ENCOUNTER — Other Ambulatory Visit (EMERGENCY_DEPARTMENT_HOSPITAL): Payer: Self-pay | Admitting: Psychiatry

## 2011-01-01 LAB — BASIC METABOLIC PANEL
Anion Gap: 6 (ref 3–11)
Calcium: 8.6 mg/dL — ABNORMAL LOW (ref 8.9–10.2)
Carbon Dioxide, Total: 27 mEq/L (ref 22–32)
Chloride: 108 mEq/L (ref 98–108)
Creatinine: 0.61 mg/dL (ref 0.38–1.02)
GFR, Calc, African American: >60 mL/min (ref >59)
GFR, Calc, European American: >60 mL/min (ref >59)
Glucose: 88 mg/dL (ref 62–125)
Potassium: 3.5 mEq/L — ABNORMAL LOW (ref 3.7–5.2)
Sodium: 141 mEq/L (ref 136–145)
Urea Nitrogen: 12 mg/dL (ref 8–21)

## 2011-01-01 LAB — CBC (HEMOGRAM)
Hematocrit: 37 % (ref 36–45)
Hemoglobin: 11.9 g/dL (ref 11.5–15.5)
MCH: 28.2 pg (ref 27.3–33.6)
MCHC: 32.1 g/dL — ABNORMAL LOW (ref 32.2–36.5)
MCV: 88 fL (ref 81–98)
Platelet Count: 192 10*3/uL (ref 150–400)
RBC: 4.22 mil/uL (ref 3.80–5.00)
RDW-CV: 14.6 % — ABNORMAL HIGH (ref 11.6–14.4)
WBC: 6.92 10*3/uL (ref 4.30–10.00)

## 2011-01-01 LAB — 1ST EXTRA RED TOP

## 2011-01-01 LAB — THYROID STIMULATING HORMONE: Thyroid Stimulating Hormone: 2.369 u[IU]/mL (ref 0.400–5.000)

## 2011-01-01 NOTE — Telephone Encounter (Signed)
Spoke with [employer]. Medication denied. Patient already switched to Imitrex.

## 2011-01-02 DIAGNOSIS — F1010 Alcohol abuse, uncomplicated: Secondary | ICD-10-CM

## 2011-01-02 DIAGNOSIS — F332 Major depressive disorder, recurrent severe without psychotic features: Secondary | ICD-10-CM

## 2011-01-03 DIAGNOSIS — F609 Personality disorder, unspecified: Secondary | ICD-10-CM

## 2011-01-04 ENCOUNTER — Other Ambulatory Visit (HOSPITAL_COMMUNITY): Payer: Self-pay | Admitting: Psychiatry

## 2011-01-04 LAB — URINALYSIS COMPLETE, URN
Bacteria, URN: NONE SEEN
Bilirubin (Qual), URN: NEGATIVE
Epith Cells_Renal/Trans,URN: NEGATIVE /HPF
Glucose Qual, URN: NEGATIVE mg/dL
Ketones, URN: NEGATIVE mg/dL
Leukocyte Esterase, URN: NEGATIVE
Nitrite, URN: NEGATIVE
Occult Blood, URN: NEGATIVE
RBC, URN: NEGATIVE /HPF
Specific Gravity, URN: 1.027 g/mL (ref 1.002–1.027)
WBC, URN: NEGATIVE /HPF
pH, URN: 5.5 (ref 5.0–8.0)

## 2011-01-04 LAB — PREGNANCY TEST, URINE
Pregnancy Test, URN: NEGATIVE
Specific Gravity, URN: 1.027 g/mL (ref 1.002–1.027)

## 2011-01-20 ENCOUNTER — Encounter (HOSPITAL_BASED_OUTPATIENT_CLINIC_OR_DEPARTMENT_OTHER): Payer: Medicare Other | Admitting: Surgery

## 2011-01-20 ENCOUNTER — Encounter (HOSPITAL_BASED_OUTPATIENT_CLINIC_OR_DEPARTMENT_OTHER): Payer: Medicare Other | Admitting: Registered"

## 2011-01-23 ENCOUNTER — Emergency Department (HOSPITAL_BASED_OUTPATIENT_CLINIC_OR_DEPARTMENT_OTHER)
Admission: EM | Admit: 2011-01-23 | Discharge: 2011-01-23 | Disposition: A | Discharge status: Home / Self Care | Payer: Medicare Other | Attending: Psychiatry | Admitting: Psychiatry

## 2011-01-23 DIAGNOSIS — Z3202 Encounter for pregnancy test, result negative: Secondary | ICD-10-CM | POA: Insufficient documentation

## 2011-01-23 DIAGNOSIS — F329 Major depressive disorder, single episode, unspecified: Secondary | ICD-10-CM

## 2011-01-24 ENCOUNTER — Ambulatory Visit (HOSPITAL_BASED_OUTPATIENT_CLINIC_OR_DEPARTMENT_OTHER): Payer: Medicare Other | Attending: Psychiatry | Admitting: Addiction Psychiatry

## 2011-01-24 ENCOUNTER — Encounter (HOSPITAL_BASED_OUTPATIENT_CLINIC_OR_DEPARTMENT_OTHER): Payer: Medicare Other | Admitting: Counselor

## 2011-01-24 ENCOUNTER — Ambulatory Visit (HOSPITAL_BASED_OUTPATIENT_CLINIC_OR_DEPARTMENT_OTHER): Payer: Medicare Other | Admitting: Psychiatry

## 2011-01-24 ENCOUNTER — Ambulatory Visit (HOSPITAL_BASED_OUTPATIENT_CLINIC_OR_DEPARTMENT_OTHER): Payer: Medicare Other | Admitting: Mental Health

## 2011-01-24 DIAGNOSIS — F3289 Other specified depressive episodes: Secondary | ICD-10-CM

## 2011-01-24 DIAGNOSIS — F603 Borderline personality disorder: Secondary | ICD-10-CM | POA: Insufficient documentation

## 2011-01-24 DIAGNOSIS — F1020 Alcohol dependence, uncomplicated: Secondary | ICD-10-CM | POA: Insufficient documentation

## 2011-01-27 ENCOUNTER — Telehealth (INDEPENDENT_AMBULATORY_CARE_PROVIDER_SITE_OTHER): Payer: Self-pay | Admitting: Wound Care

## 2011-01-27 NOTE — Telephone Encounter (Signed)
Noted  

## 2011-01-27 NOTE — Telephone Encounter (Signed)
Flor with human services [facility] for [facility] disabilities was calling to cordinnate care with Dr. Ennis Forts. Wants to discuss pts suicide attempts as well. Please call to discuss. Thanks

## 2011-01-27 NOTE — Telephone Encounter (Signed)
FYI to provider that from Aging and disability that patient was admitted to [facility] psychiatric unit and discharged and now in a nursing home and they are trying to coordinate care with patient. No call back needed

## 2011-01-29 ENCOUNTER — Emergency Department (HOSPITAL_BASED_OUTPATIENT_CLINIC_OR_DEPARTMENT_OTHER)
Admission: EM | Admit: 2011-01-29 | Discharge: 2011-01-29 | Disposition: A | Discharge status: Home / Self Care | Payer: Medicare Other | Attending: Nurse Practitioner | Admitting: Nurse Practitioner

## 2011-01-30 ENCOUNTER — Other Ambulatory Visit (EMERGENCY_DEPARTMENT_HOSPITAL): Payer: Self-pay | Admitting: Emergency Medicine

## 2011-01-30 ENCOUNTER — Emergency Department (HOSPITAL_BASED_OUTPATIENT_CLINIC_OR_DEPARTMENT_OTHER)
Admission: EM | Admit: 2011-01-30 | Discharge: 2011-01-31 | Disposition: A | Discharge status: Home / Self Care | Payer: Medicare Other | Attending: Emergency Medicine | Admitting: Emergency Medicine

## 2011-01-30 DIAGNOSIS — F3289 Other specified depressive episodes: Secondary | ICD-10-CM | POA: Insufficient documentation

## 2011-01-30 DIAGNOSIS — Z09 Encounter for follow-up examination after completed treatment for conditions other than malignant neoplasm: Secondary | ICD-10-CM | POA: Insufficient documentation

## 2011-01-30 DIAGNOSIS — R209 Unspecified disturbances of skin sensation: Secondary | ICD-10-CM | POA: Insufficient documentation

## 2011-01-30 DIAGNOSIS — F329 Major depressive disorder, single episode, unspecified: Secondary | ICD-10-CM

## 2011-01-30 DIAGNOSIS — R45851 Suicidal ideations: Secondary | ICD-10-CM

## 2011-01-30 LAB — PROTHROMBIN & PTT
Partial Thromboplastin Time: 33 s (ref 22–35)
Prothrombin INR: 1 (ref 0.8–1.3)
Prothrombin Time Patient: 13.1 s (ref 10.7–15.6)

## 2011-01-30 LAB — COMPREHENSIVE METABOLIC PANEL
ALT (GPT): 13 U/L (ref 6–40)
AST (GOT): 15 U/L (ref 15–40)
Albumin: 4 g/dL (ref 3.5–5.2)
Alkaline Phosphatase (Total): 68 U/L (ref 25–100)
Anion Gap: 10 (ref 3–11)
Bilirubin (Total): 0.6 mg/dL (ref 0.2–1.3)
Calcium: 9.3 mg/dL (ref 8.9–10.2)
Carbon Dioxide, Total: 25 mEq/L (ref 22–32)
Chloride: 105 mEq/L (ref 98–108)
Creatinine: 0.74 mg/dL (ref 0.38–1.02)
GFR, Calc, African American: >60 mL/min (ref >59)
GFR, Calc, European American: >60 mL/min (ref >59)
Glucose: 104 mg/dL (ref 62–125)
Potassium: 3.3 mEq/L — ABNORMAL LOW (ref 3.7–5.2)
Protein (Total): 7.3 g/dL (ref 6.0–8.2)
Sodium: 140 mEq/L (ref 136–145)
Urea Nitrogen: 12 mg/dL (ref 8–21)

## 2011-01-30 LAB — CBC (HEMOGRAM)
Hematocrit: 41 % (ref 36–45)
Hemoglobin: 13.2 g/dL (ref 11.5–15.5)
MCH: 28.2 pg (ref 27.3–33.6)
MCHC: 32.6 g/dL (ref 32.2–36.5)
MCV: 87 fL (ref 81–98)
Platelet Count: 235 10*3/uL (ref 150–400)
RBC: 4.68 mil/uL (ref 3.80–5.00)
RDW-CV: 14 % (ref 11.6–14.4)
WBC: 12.23 10*3/uL — ABNORMAL HIGH (ref 4.30–10.00)

## 2011-01-30 LAB — SALICYLATE: Salicylate: <4 mg/dL — ABNORMAL LOW (ref 5–30)

## 2011-01-30 LAB — ALCOHOL (ETHYL): Alcohol (Ethyl): NEGATIVE mg/dL

## 2011-01-30 LAB — ACETAMINOPHEN (TYLENOL): Acetaminophen (Tylenol): <10 ug/mL — ABNORMAL LOW (ref 10–25)

## 2011-01-31 ENCOUNTER — Other Ambulatory Visit (EMERGENCY_DEPARTMENT_HOSPITAL): Payer: Self-pay | Admitting: Emergency Medicine

## 2011-01-31 ENCOUNTER — Emergency Department
Admission: EM | Admit: 2011-01-31 | Discharge: 2011-01-31 | Payer: Medicare Other | Attending: Emergency Medicine | Admitting: Emergency Medicine

## 2011-01-31 ENCOUNTER — Emergency Department (EMERGENCY_DEPARTMENT_HOSPITAL)
Admission: EM | Admit: 2011-01-31 | Discharge: 2011-01-31 | Disposition: A | Discharge status: Home / Self Care | Payer: Medicare Other | Source: Home / Self Care

## 2011-01-31 DIAGNOSIS — F3289 Other specified depressive episodes: Secondary | ICD-10-CM | POA: Insufficient documentation

## 2011-01-31 DIAGNOSIS — R209 Unspecified disturbances of skin sensation: Secondary | ICD-10-CM

## 2011-01-31 DIAGNOSIS — F329 Major depressive disorder, single episode, unspecified: Secondary | ICD-10-CM

## 2011-01-31 LAB — EMERGENCY DRUG SCREEN, URN
Acetaminophen Qual, URN: NEGATIVE
Amphetamine Qual, Urine: NEGATIVE
Barbiturate Qual, Urine: NEGATIVE
Benzodiazepines Qual, Urine: NEGATIVE
Cocaine Qual, Urine: NEGATIVE
Drug Screen Test Info, URN: POSITIVE
Drug Screen Test Info, URN: POSITIVE
Methadone Qual, Urine: NEGATIVE
Opiates Qual, Urine: NEGATIVE
Phencyclidine Qual, Urine: NEGATIVE
Tricyclic Antidepressants, Urine: NEGATIVE

## 2011-01-31 LAB — BASIC METABOLIC PANEL
Anion Gap: 9 (ref 3–11)
Calcium: 8.9 mg/dL (ref 8.9–10.2)
Carbon Dioxide, Total: 22 mEq/L (ref 22–32)
Chloride: 105 mEq/L (ref 98–108)
Creatinine: 0.6 mg/dL (ref 0.38–1.02)
GFR, Calc, African American: >60 mL/min (ref >59)
GFR, Calc, European American: >60 mL/min (ref >59)
Glucose: 103 mg/dL (ref 62–125)
Potassium: 3.9 mEq/L (ref 3.7–5.2)
Sodium: 136 mEq/L (ref 136–145)
Urea Nitrogen: 9 mg/dL (ref 8–21)

## 2011-01-31 LAB — 1ST EXTRA RED TOP

## 2011-01-31 LAB — URINALYSIS COMPLETE, URN
Bacteria, URN: NONE SEEN
Bilirubin (Qual), URN: NEGATIVE
Comments for Macroscopic, URN: DETECTED — AB
Epith Cells_Renal/Trans,URN: NEGATIVE /HPF
Glucose Qual, URN: NEGATIVE mg/dL
Leukocyte Esterase, URN: NEGATIVE
Nitrite, URN: NEGATIVE
Occult Blood, URN: NEGATIVE
Protein (Alb Semiquant), URN: NEGATIVE mg/dL
RBC, URN: NEGATIVE /HPF
Specific Gravity, URN: 1.018 g/mL (ref 1.002–1.027)
WBC, URN: NEGATIVE /HPF
pH, URN: 6 (ref 5.0–8.0)

## 2011-01-31 LAB — ALCOHOL (ETHYL): Alcohol (Ethyl): NEGATIVE mg/dL

## 2011-01-31 LAB — CBC (HEMOGRAM)
Hematocrit: 38 % (ref 36–45)
Hemoglobin: 12.2 g/dL (ref 11.5–15.5)
MCH: 28.3 pg (ref 27.3–33.6)
MCHC: 32.5 g/dL (ref 32.2–36.5)
MCV: 87 fL (ref 81–98)
Platelet Count: 241 10*3/uL (ref 150–400)
RBC: 4.31 mil/uL (ref 3.80–5.00)
RDW-CV: 13.8 % (ref 11.6–14.4)
WBC: 12.19 10*3/uL — ABNORMAL HIGH (ref 4.3–10.0)

## 2011-01-31 LAB — ACETAMINOPHEN (TYLENOL): Acetaminophen (Tylenol): <10 ug/mL — ABNORMAL LOW (ref 10–25)

## 2011-01-31 LAB — SALICYLATE: Salicylate: <4 mg/dL — ABNORMAL LOW (ref 5–30)

## 2011-02-03 ENCOUNTER — Ambulatory Visit (INDEPENDENT_AMBULATORY_CARE_PROVIDER_SITE_OTHER): Payer: Medicare Other | Admitting: Nurse Practitioner

## 2011-02-03 ENCOUNTER — Encounter (INDEPENDENT_AMBULATORY_CARE_PROVIDER_SITE_OTHER): Payer: Self-pay | Admitting: Nurse Practitioner

## 2011-02-03 DIAGNOSIS — B379 Candidiasis, unspecified: Secondary | ICD-10-CM

## 2011-02-03 MED ORDER — KETOCONAZOLE 2 % EX CREA
TOPICAL_CREAM | CUTANEOUS | Status: DC
Start: 2011-02-03 — End: 2011-03-21

## 2011-02-03 MED ORDER — DOMEBORO 25 % EX PACK
PACK | CUTANEOUS | Status: DC
Start: 2011-02-03 — End: 2011-03-21

## 2011-02-03 NOTE — Progress Notes (Signed)
Why is patient here today? Patient being seen for rash on abdomin.     Any new significant medical diagnoses since last visit? NO    Refills? NO    Referral? NO    Letter or form? NO    Lab results? NO    Health maintenance issues? NO

## 2011-02-03 NOTE — Progress Notes (Signed)
Sharon Stephens  is a 35 year old  old female  who presents with the following concerns:    Left hip area has rash    --Location: left hip  Quality:  Sore, itchy  Severity: mild  Duration: 2 days  Timing: imchanged  Context: Previous history of rash in this area  Modifying factors:  Made better by peroxide and aloe vera- help, made worse by sweating, moist.  Associated signs and symptoms:  Red, but without  warmth or swelling, no discharge. No fever.      PMH, PSH, Family history, social history reviewed and amended as appropriate (please see history tabs in EMR.)    Patient Active Problem List   Diagnoses Date Noted   . NONSPEC ABNL PAP SMEAR CERVIX, UNSPEC [795.00] 11/19/2010     ASCUS +HPV     . MORBID OBESITY [278.01] 12/14/2009   . CHEST PAIN NOS [786.50] 12/14/2009     Non cardiac.      . BENIGN HYPERTENSION [401.1] 10/17/2009   . MIGRAINE NOS INTRACTABLE [346.91] 10/17/2009       History     Social History   . Marital Status: Single     Spouse Name: N/A     Number of Children: N/A   . Years of Education: N/A     Social History Main Topics   . Smoking status: Former Games developer   . Smokeless tobacco: Not on file   . Alcohol Use: No   . Drug Use: No   . Sexually Active: Yes -- Female, Female partner(s)     Other Topics Concern   . Not on file     Social History Narrative    Lives in [place] with friends and children. Moved here to "get aware from my kid's father". Denies hx of abuse. Former smoker, denies TED at current. Unemployed.Female partner       OBJECTIVE    BP 134/82  Pulse 73  Temp(Src) 98.3 F (36.8 C) (Temporal)  Resp 16  Wt 375 lb 9.6 oz (170.371 kg)  SpO2 96%   GEN:  Alert, pleasant, NAD  SKIN:  Left intertriginous skin fold waist level pannus with macerated, reddened skin. 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 Return  to clinic if symptoms progress or fail to improve w/ current recommendations.

## 2011-02-03 NOTE — Patient Instructions (Signed)
Ketoconazole cream topical once or twice a day to the residual body fold lesion activity. Use a daily lubricant.  Try  Domeboro compressing to dry the area and cotton underwear to separate the fold, try to keep skin dry with a hair drier, cautious safe way.

## 2011-02-04 ENCOUNTER — Encounter (HOSPITAL_BASED_OUTPATIENT_CLINIC_OR_DEPARTMENT_OTHER): Payer: Medicare Other | Admitting: Counselor

## 2011-02-04 ENCOUNTER — Ambulatory Visit (HOSPITAL_BASED_OUTPATIENT_CLINIC_OR_DEPARTMENT_OTHER): Payer: Medicare Other | Attending: Psychiatry | Admitting: Psychiatry

## 2011-02-04 DIAGNOSIS — F609 Personality disorder, unspecified: Secondary | ICD-10-CM | POA: Insufficient documentation

## 2011-02-04 DIAGNOSIS — F3289 Other specified depressive episodes: Secondary | ICD-10-CM

## 2011-02-10 ENCOUNTER — Ambulatory Visit (HOSPITAL_BASED_OUTPATIENT_CLINIC_OR_DEPARTMENT_OTHER): Payer: Medicare Other | Admitting: Addiction (Substance Use Disorder)

## 2011-02-11 ENCOUNTER — Ambulatory Visit (HOSPITAL_BASED_OUTPATIENT_CLINIC_OR_DEPARTMENT_OTHER): Payer: Medicare Other | Admitting: Counselor

## 2011-02-11 ENCOUNTER — Ambulatory Visit (INDEPENDENT_AMBULATORY_CARE_PROVIDER_SITE_OTHER): Payer: Medicare Other | Admitting: Medical

## 2011-02-11 VITALS — BP 142/81 | HR 90 | Temp 98.5°F | Resp 20 | Wt 377.0 lb

## 2011-02-11 DIAGNOSIS — R3129 Other microscopic hematuria: Secondary | ICD-10-CM

## 2011-02-11 DIAGNOSIS — N3289 Other specified disorders of bladder: Secondary | ICD-10-CM

## 2011-02-11 LAB — PR U/A AUTO W/MICRO, ONSITE
Bilirubin (Indirect): NEGATIVE
Casts, URN: NEGATIVE /LPF
Crystals, URN: NEGATIVE /LPF
Epithelial Cells, URN: NEGATIVE /LPF
Glucose, Urine: NEGATIVE mg/dL
Ketones, URN: NEGATIVE mg/dL
Nitrite, URN: NEGATIVE
Specific Gravity, URN: 1.03 (ref 1.005–1.030)
pH, Whole Blood: 5.5 (ref 5.0–8.0)

## 2011-02-11 MED ORDER — NITROFURANTOIN MONOHYD MACRO 100 MG OR CAPS
ORAL_CAPSULE | ORAL | Status: AC
Start: 2011-02-11 — End: 2011-02-18

## 2011-02-11 NOTE — Progress Notes (Signed)
SUBJECTIVE: Sharon Stephens is a 35 year old female who complains of difficult to empty bladder and bladder spasms  x 1days, without flank pain, fever, chills, or abnormal vaginal discharge or bleeding.   She has had several episodes of kidney stones, the last was 5 years ago     OBJECTIVE: Appears well, in no apparent distress.  Vital signs are normal.  No CVA tenderness  ASSESSMENT: UTI uncomplicated without evidence of pyelonephritis  URINALYSIS Latest Ref Rng 02/11/2011   Color  yellow   Clarity  clear   Indir Bili NEG-  neg by Ictotest   Ketones NEG- mg/dL neg   Spec Grav 1.610-9.604  1.030   Blood NEG-  3+ (A)   pH 5.0-8.0  5.5   Protein NEG-TRACE- mg/dL 1+ (A)   Urobilin NORMAL- E.U./dL norm   Nitrite NEG-  neg   Leukocytes NEG-  1+ (A)   URBC NEG(0-2)- /HPF 2+(5-8) (A)   UWBC NEG(0-5)- /HPF 1+(6-20) (A)   UEPITH  neg   UBCT  2+(moderate) (A)   UCAST  neg   UCRST  neg   BAOTHR       PLAN: Treatment per orders -     Call or return to clinic prn if these symptoms worsen or fail to improve as anticipated.    She will return next week to leave another UA to make sure hematuria resolves

## 2011-02-11 NOTE — Progress Notes (Signed)
Why is patient here today? Patient here with urinary spasms x sudden onset this morning. Has had 4 UTI's this year since February.     Any new significant medical diagnoses since last visit? NO    Refills? NO    Referral? NO    Letter or form? NO    Lab results? NO    Health maintenance issues? NO

## 2011-02-12 ENCOUNTER — Encounter (HOSPITAL_BASED_OUTPATIENT_CLINIC_OR_DEPARTMENT_OTHER): Payer: Medicare Other | Admitting: Counselor

## 2011-02-12 ENCOUNTER — Encounter (HOSPITAL_BASED_OUTPATIENT_CLINIC_OR_DEPARTMENT_OTHER): Payer: Medicare Other | Admitting: Psychiatry

## 2011-02-12 LAB — URINE C/S: Colony Count: 10000

## 2011-02-17 ENCOUNTER — Other Ambulatory Visit: Payer: Self-pay

## 2011-02-17 ENCOUNTER — Ambulatory Visit (INDEPENDENT_AMBULATORY_CARE_PROVIDER_SITE_OTHER): Payer: Medicare Other | Admitting: Internal Medicine

## 2011-02-17 ENCOUNTER — Ambulatory Visit (INDEPENDENT_AMBULATORY_CARE_PROVIDER_SITE_OTHER): Payer: Medicare Other

## 2011-02-17 ENCOUNTER — Encounter (INDEPENDENT_AMBULATORY_CARE_PROVIDER_SITE_OTHER): Payer: Self-pay | Admitting: Internal Medicine

## 2011-02-17 VITALS — BP 124/82 | HR 72 | Resp 16 | Wt 380.0 lb

## 2011-02-17 DIAGNOSIS — Z9884 Bariatric surgery status: Secondary | ICD-10-CM | POA: Insufficient documentation

## 2011-02-17 DIAGNOSIS — R3129 Other microscopic hematuria: Secondary | ICD-10-CM

## 2011-02-17 DIAGNOSIS — M25519 Pain in unspecified shoulder: Secondary | ICD-10-CM

## 2011-02-17 DIAGNOSIS — E539 Vitamin B deficiency, unspecified: Secondary | ICD-10-CM

## 2011-02-17 LAB — PR U/A AUTO DIPSTICK ONLY, ONSITE
Bilirubin (Indirect): NEGATIVE
Glucose, Urine: NEGATIVE mg/dL
Ketones, URN: NEGATIVE mg/dL
Leukocytes: NEGATIVE
Nitrite, URN: NEGATIVE
Protein: NEGATIVE mg/dL
Specific Gravity, URN: 1.03 (ref 1.005–1.030)
Urobilinogen, URN: NORMAL E.U./dL
pH, Whole Blood: 6 (ref 5.0–8.0)

## 2011-02-17 LAB — PR X-RAY CLAVICLE RIGHT

## 2011-02-17 LAB — PR X-RAY SHOULDER 2+ VW RIGHT

## 2011-02-17 NOTE — Progress Notes (Signed)
(Please see information listed by medical assistant below which is reviewed and confirmed  today.  4:21 PM )      Sharon Stephens is a 35 year old female  who presents with the following concerns:    Chief Complaint   Patient presents with   . Shoulder Pain     pt here c/o R shoulder pain after falling out of bed last night.        --Location: Right shoulder  Quality: painful with reduced range of motion   Severity: moderate  Duration: < 24 hours.   Timing: after falling out of bed onto right shoulder.   Context: She is morbidly obese so the trauma is significant.       PMH, PSH, Family history, social history reviewed and amended as appropriate (please see history tabs in EMR.)    Patient Active Problem List   Diagnoses Date Noted   . NONSPEC ABNL PAP SMEAR CERVIX, UNSPEC [795.00] 11/19/2010     ASCUS +HPV     . MORBID OBESITY [278.01] 12/14/2009   . CHEST PAIN NOS [786.50] 12/14/2009     Non cardiac.      . BENIGN HYPERTENSION [401.1] 10/17/2009   . MIGRAINE NOS INTRACTABLE [346.91] 10/17/2009             Review of Systems   Constitutional:        She has had intentional weight loss of about 150 pounds with bariatric surgery.  Recently has gained weight, however.    HENT: Negative.    Eyes: Negative.    Neurological:        She didn't have a seizure but was sleeping in a smaller bed and rolled out.          PE:  CONSTITUTIONAL/GENERAL:      BP 124/82  Pulse 72  Resp 16  Wt 380 lb (172.367 kg)    Appearance:  Well developed, appearing stated age and in no acute distress,   Weight appears heavy for height,   Gait and station:  Normal.      Physical Exam   Musculoskeletal:        There is tenderness over the deltoid region.  She has intact range of motion actively and passively in the right arm, but she does so with discomfort.      She is tender in the mid region of the right scapula, no ecchymosis, however, and no swelling.    Sharon Stephens is a 75 year old with:    719.41 Pain in joint, shoulder region   (primary encounter diagnosis)  Comment: There is no evidence of fracture of the clavicle or shoulder.  Discussed the need to have range of motion to avoid adhesive capsultiti.  This was demonstrated for her. Plan: X-RAY SHOULDER 2+ VW RIGHT, X-RAY CLAVICLE         RIGHT            278.01 Morbid obesity  Comment: post bariatric surgery.   Plan: VITAMIN B12 INJECTION            266.9 Unspecified vitamin B deficiency  Comment:   Plan: VITAMIN B12 INJECTION            599.72 Microscopic hematuria  Comment: she is requesting a urinalysis to follow up on hematuria noted at the time of urinary tract infection.    Plan: U/A AUTO DIPSTICK ONLY        URINALYSIS Latest Ref Rng 02/11/2011 02/17/2011  Color  yellow DARK YELLOW   Clarity  clear CLOUDY   Indir Bili NEG-  neg by Ictotest NEG BY ICTOTEST   Ketones NEG- mg/dL neg NEG   Spec Grav 8.413-2.440  1.030 1.030   Blood NEG-  3+ (A) TRACE (A)   pH 5.0-8.0  5.5 6.0   Protein NEG-TRACE- mg/dL 1+ (A) NEG   Urobilin NORMAL- E.U./dL norm NORMAL   Nitrite NEG-  neg NEG   Leukocytes NEG-  1+ (A) NEG

## 2011-02-17 NOTE — Progress Notes (Signed)
Why is patient here today? See chief complaint   Does patient need refills? no  Does patient need a referral? no  Does patient need a letter or need forms filled out? no  Lab results? no  Please check health mainenance issues and flag for the doctor: no

## 2011-02-18 NOTE — Progress Notes (Signed)
Addended by: Porfirio Mylar CID on: 02/18/2011 10:07 PM     Modules accepted: Orders

## 2011-02-19 ENCOUNTER — Ambulatory Visit (HOSPITAL_BASED_OUTPATIENT_CLINIC_OR_DEPARTMENT_OTHER): Payer: Medicare Other | Attending: Psychiatry | Admitting: Psychiatry

## 2011-02-19 DIAGNOSIS — F3289 Other specified depressive episodes: Secondary | ICD-10-CM | POA: Insufficient documentation

## 2011-02-19 DIAGNOSIS — F609 Personality disorder, unspecified: Secondary | ICD-10-CM | POA: Insufficient documentation

## 2011-02-20 ENCOUNTER — Ambulatory Visit (INDEPENDENT_AMBULATORY_CARE_PROVIDER_SITE_OTHER): Payer: Medicare Other | Admitting: Family Medicine

## 2011-02-20 VITALS — BP 140/82 | HR 83 | Temp 98.0°F | Wt 375.0 lb

## 2011-02-20 DIAGNOSIS — M7989 Other specified soft tissue disorders: Secondary | ICD-10-CM

## 2011-02-20 NOTE — Progress Notes (Signed)
Hand and foot swelling for past few days  Hard to drink, but can only to 2oz q33min due to surgery  No airconditioning, here for 2 years  Subjective: Patient is a 35 year old woman who complains of hand and foot swelling over the past several days. She is status post gastric bypass surgery and can only drink about 2 ounces at a time of fluid. The weather recently has been exceptionally hot and she does not have air conditioning. She is not very active. He otherwise is feeling well.    Patient Active Problem List   Diagnoses   . BENIGN HYPERTENSION   . MIGRAINE NOS INTRACTABLE   . MORBID OBESITY   . CHEST PAIN NOS   . NONSPEC ABNL PAP SMEAR CERVIX, UNSPEC   . Bariatric surgery status     Current Outpatient Prescriptions   Medication Sig   . Alum Sulfate-Ca Acetate (DOMEBORO) 25 % External Pack apply 1-2 times per day to affected area   . Bisacodyl 5 MG Oral Tab EC one tablet daily for constipation   . Calcium Carbonate (CALCIUM 500) 1250 MG Oral Tab one tablet three times daily   . Cholecalciferol (VITAMIN D) 2000 UNIT OR CAPS one tablet daily    . Docusate Sodium (COLACE) 100 MG Oral Cap Take 1 capsule 2 times per day for constipation   . Gabapentin 300 MG Oral Cap 1 capsule three times a day   . Ketoconazole 2 % External Cream apply to rash area three times a day   . Naproxen 500 MG Oral Tab Take 1 tablet by mouth 2 times per day as with food as needed for pain   . Pediatric Multivitamins-Iron (ANIMAL CHEWABLE MULTIPLE VIT) Oral Chew Tab one chewable vitamin daily   . Prochlorperazine Maleate 10 MG Oral Tab take one tablet by mouth every 6 hours as needed for nausea/headache   . Sertraline HCl 100 MG Oral Tab 1 TABLET DAILY   . SUMAtriptan Succinate (IMITREX) 50 MG Oral Tab 1 tablet at headache onset, repeat every 2 hrs. if needed, max 200mg  per 24 hrs.   . TraZODone HCl 50 MG Oral Tab 1 TABLET  AT BEDTIME     Past Medical History   Diagnosis Date   . Depressive disorder, not elsewhere classified    . Unspecified  sleep apnea    . Hernia of other specified sites of abdominal cavity without mention of obstruction or gangrene      notes having 3 in abdomen    . Allergic rhinitis, cause unspecified    . Chronic obstructive asthma, unspecified    . Type II or unspecified type diabetes mellitus without mention of complication, not stated as uncontrolled      broaderline    . Esophageal reflux    . Essential hypertension, benign      History     Social History   . Marital Status: Single     Spouse Name: N/A     Number of Children: N/A   . Years of Education: N/A     Occupational History   . Not on file.     Social History Main Topics   . Smoking status: Former Games developer   . Smokeless tobacco: Not on file   . Alcohol Use: No   . Drug Use: No   . Sexually Active: Yes -- Female, Female partner(s)     Other Topics Concern   . Not on file     Social History Narrative  Lives in [place] with friends and children. Moved here to "get aware from my kid's father". Denies hx of abuse. Former smoker, denies TED at current. Unemployed.Female partner     ROS: Denies shortness of breath, chest pain, rash    Objective: Pleasant woman in NAD, morbidly obese  Hands and feet with very mild, diffuse swelling without erythema  Heart RRR  Lungs clear    729.81 Swelling of limb  (primary encounter diagnosis)  Comment: I suspect the swelling is from the very hot weather in combination with not drinking enough fluid. Encouraged her to increase her fluid intake and follow up with me in a few days if symptoms are not improving

## 2011-02-20 NOTE — Progress Notes (Signed)
Why is patient here today? Patient is here c/o swelling in her hands and feet x 3-4 days. She is also c/o headaches.   Does patient need refills? no  Does patient need a referral? no  Does patient need a letter or need forms filled out? no  Lab results? no  Please check health mainenance issues and flag for the doctor: none

## 2011-02-24 ENCOUNTER — Ambulatory Visit: Payer: Medicare Other | Attending: Surgery | Admitting: Surgery

## 2011-02-24 ENCOUNTER — Ambulatory Visit (HOSPITAL_BASED_OUTPATIENT_CLINIC_OR_DEPARTMENT_OTHER): Payer: Medicare Other | Admitting: Registered"

## 2011-02-24 ENCOUNTER — Encounter (HOSPITAL_BASED_OUTPATIENT_CLINIC_OR_DEPARTMENT_OTHER): Payer: Medicare Other | Admitting: Clinical

## 2011-02-24 ENCOUNTER — Other Ambulatory Visit (HOSPITAL_BASED_OUTPATIENT_CLINIC_OR_DEPARTMENT_OTHER): Payer: Self-pay | Admitting: Registered Nurse

## 2011-02-24 DIAGNOSIS — Z9884 Bariatric surgery status: Secondary | ICD-10-CM

## 2011-02-24 LAB — COMPREHENSIVE METABOLIC PANEL
ALT (GPT): 10 U/L (ref 6–40)
AST (GOT): 13 U/L — ABNORMAL LOW (ref 15–40)
Albumin: 3.5 g/dL (ref 3.5–5.2)
Alkaline Phosphatase (Total): 66 U/L (ref 25–100)
Anion Gap: 6 (ref 3–11)
Bilirubin (Total): 0.4 mg/dL (ref 0.2–1.3)
Calcium: 8.9 mg/dL (ref 8.9–10.2)
Carbon Dioxide, Total: 26 mEq/L (ref 22–32)
Chloride: 107 mEq/L (ref 98–108)
Creatinine: 0.64 mg/dL (ref 0.38–1.02)
GFR, Calc, African American: >60 mL/min (ref >59)
GFR, Calc, European American: >60 mL/min (ref >59)
Glucose: 97 mg/dL (ref 62–125)
Potassium: 3.8 mEq/L (ref 3.7–5.2)
Protein (Total): 6.5 g/dL (ref 6.0–8.2)
Sodium: 139 mEq/L (ref 136–145)
Urea Nitrogen: 13 mg/dL (ref 8–21)

## 2011-02-24 LAB — CBC (HEMOGRAM)
Hematocrit: 38 % (ref 36–45)
Hemoglobin: 12.1 g/dL (ref 11.5–15.5)
MCH: 28.2 pg (ref 27.3–33.6)
MCHC: 32.1 g/dL — ABNORMAL LOW (ref 32.2–36.5)
MCV: 88 fL (ref 81–98)
Platelet Count: 255 10*3/uL (ref 150–400)
RBC: 4.29 mil/uL (ref 3.80–5.00)
RDW-CV: 14.2 % (ref 11.6–14.4)
WBC: 11.11 10*3/uL — ABNORMAL HIGH (ref 4.3–10.0)

## 2011-02-25 ENCOUNTER — Encounter (HOSPITAL_BASED_OUTPATIENT_CLINIC_OR_DEPARTMENT_OTHER): Payer: Medicare Other | Admitting: Counselor

## 2011-03-21 ENCOUNTER — Ambulatory Visit (INDEPENDENT_AMBULATORY_CARE_PROVIDER_SITE_OTHER): Payer: Medicare Other | Admitting: Medical

## 2011-03-21 ENCOUNTER — Encounter (INDEPENDENT_AMBULATORY_CARE_PROVIDER_SITE_OTHER): Payer: Self-pay | Admitting: Medical

## 2011-03-21 VITALS — BP 124/81 | HR 71 | Resp 20 | Wt 378.0 lb

## 2011-03-21 DIAGNOSIS — E539 Vitamin B deficiency, unspecified: Secondary | ICD-10-CM

## 2011-03-21 DIAGNOSIS — Z23 Encounter for immunization: Secondary | ICD-10-CM

## 2011-03-21 DIAGNOSIS — F411 Generalized anxiety disorder: Secondary | ICD-10-CM

## 2011-03-21 MED ORDER — GABAPENTIN 300 MG OR CAPS
ORAL_CAPSULE | ORAL | Status: DC
Start: 2011-03-21 — End: 2011-05-26

## 2011-03-21 MED ORDER — SERTRALINE HCL 100 MG OR TABS
ORAL_TABLET | ORAL | Status: DC
Start: 2011-03-21 — End: 2011-07-04
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
## 2011-03-21 NOTE — Progress Notes (Signed)
Why is patient here today? Patient is here for refills.     Any new significant medical diagnoses since last visit? NO    Refills? YES see order    Referral? NO    Letter or form? NO    Lab results? NO    Health maintenance issues? NO      Flu Screening Questions:    1) Currently have a moderate or severe illness, including fever? NO    2) Ever had a serious allergic reaction to eggs?  NO    3) Ever had a serious reaction to a flu vaccine or another vaccine in the past? NO    4) Ever had Guillain-Barre syndrome associated with a vaccine? NO    5) Less than 6 months old? NO    If YES to any of the questions above - NO Flu Vaccine to be given.  Patient may consult provider as needed.    If NO to all questions above - Patient may receive Flu Shot (IM)    If between 6 months and 72 years of age, was flu vaccine received last year?  N/A    If NO to above question - Needs 2 doses of flu vaccine this year (Future order flu vaccine for at least 4 wks from today).    Flu Mist (Nasal) may be considered if patient is >2 yrs old and < 31 yrs old.    Do you wish to proceed with screening questions for Flu Mist?  No      Patient/Parent has reviewed the appropriate VIS and has all questions answered? YES    Vaccine given today without initial adverse effect. Dorise Hiss MA

## 2011-04-01 NOTE — Progress Notes (Signed)
Addended by: Porfirio Mylar CID on: 04/01/2011 11:04 AM     Modules accepted: Orders

## 2011-04-03 ENCOUNTER — Ambulatory Visit (INDEPENDENT_AMBULATORY_CARE_PROVIDER_SITE_OTHER): Payer: Medicare Other | Admitting: Medical

## 2011-04-03 ENCOUNTER — Encounter (INDEPENDENT_AMBULATORY_CARE_PROVIDER_SITE_OTHER): Payer: Self-pay | Admitting: Medical

## 2011-04-03 VITALS — BP 112/78 | HR 88 | Resp 22 | Wt 374.0 lb

## 2011-04-03 DIAGNOSIS — M79609 Pain in unspecified limb: Secondary | ICD-10-CM

## 2011-04-03 LAB — PR X-RAY FOOT 3+ VW RIGHT

## 2011-04-03 LAB — PR X-RAY FOOT 2 VW RIGHT

## 2011-04-03 NOTE — Progress Notes (Signed)
11:14 AM  Sharon Stephens  is a 35 year old female  with the following concerns:    Went to ED at [facility] on 9/18, 9/17 was going to step up on the curb and stepped into a hole and hyper flexed foot, had pain in lateral portion of top of foot  According to her the X rays  no fracture in ED  She continues to have pain   States she has pain when she bears weight and moves her toes.   6-7/10 pain is staying abut the same   Worse in the evening and when she has been more active.   She tried ice, ibuprofen and tylenol.     Current Outpatient Prescriptions   Medication Sig   . Cholecalciferol (VITAMIN D) 2000 UNIT OR CAPS one tablet daily    . Gabapentin 300 MG Oral Cap 1 capsule three times a day   . Prochlorperazine Maleate 10 MG Oral Tab take one tablet by mouth every 6 hours as needed for nausea/headache   . Sertraline HCl 100 MG Oral Tab 1 TABLET DAILY   . SUMAtriptan Succinate (IMITREX) 50 MG Oral Tab 1 tablet at headache onset, repeat every 2 hrs. if needed, max 200mg  per 24 hrs.   . TraZODone HCl 50 MG Oral Tab 1 TABLET  AT BEDTIME     Review of patient's allergies indicates:  Allergies   Allergen Reactions   . Amoxicillin      Unsure from childhood    . Penicillins      Unsure from childhood      Patient Active Problem List   Diagnoses   . BENIGN HYPERTENSION   . MIGRAINE NOS INTRACTABLE   . MORBID OBESITY   . CHEST PAIN NOS   . NONSPEC ABNL PAP SMEAR CERVIX, UNSPEC   . Bariatric surgery status     OBJECTIVE:  BP 112/78  Pulse 88  Resp 22  Wt 374 lb (169.645 kg)  GENERAL:obese, well nourished patient in no acute distress.  Alert and oriented x3.    MSK:  No visible deformities. No edema, no ecchymosis  .  Pain with palpation over 1st-3rd metatarsals.     X ray normal    I reviewed the x-ray myself in clinic today and preliminary report above reflects my reading. The final x-ray will be read by [facility] and any changes will be conveyed to the patient    729.5 Pain in limb  (primary encounter  diagnosis)  ED records requested   Plan:rest, supportive shoes, NSAIDS    Return if pain persists.

## 2011-04-03 NOTE — Progress Notes (Signed)
Why is patient here today? Patient is here for ER follow up 03/25/11 regarding right foot pain. Patient states x-ray was done at ER-no fx.     Any new significant medical diagnoses since last visit? NO    Refills? NO    Referral? NO    Letter or form? NO    Lab results? NO    Health maintenance issues? NO

## 2011-04-04 ENCOUNTER — Telehealth (INDEPENDENT_AMBULATORY_CARE_PROVIDER_SITE_OTHER): Payer: Self-pay | Admitting: Medical

## 2011-04-04 ENCOUNTER — Encounter (INDEPENDENT_AMBULATORY_CARE_PROVIDER_SITE_OTHER): Payer: Self-pay

## 2011-04-04 NOTE — Telephone Encounter (Signed)
CONFIRMED PHONE NUMBER: [phone]  CALLERS FIRST AND LAST NAME: Pershing Proud  FACILITY NAME: n/a TITLE: n/a  CALLERS RELATIONSHIP:Self  RETURN CALL: General message on voicemail only    SUBJECT: Letter/Form Request   REASON FOR REQUEST: Patient states that she needs a letter from Purnell Shoemaker, [place] to excuse her from work.    WHAT IS THE LETTER/FORM FOR: Other: excuse from work.   WHO SHOULD FILL IT OUT: Purnell Shoemaker, PA  WHERE SHOULD IT BE SENT: faxed [phone]  DATE NEEDED BY: asap

## 2011-04-04 NOTE — Telephone Encounter (Signed)
CCR-What dates does she need? Left message for patient

## 2011-04-04 NOTE — Telephone Encounter (Signed)
Patient is requesting to be released from work 9/27-10-4 regarding:    729.5 Pain in limb (primary encounter diagnosis)   ED records requested   Plan:rest, supportive shoes, NSAIDS    See pended letter

## 2011-04-04 NOTE — Telephone Encounter (Signed)
CONFIRMED PHONE NUMBER: [phone]  CALLERS FIRST AND LAST NAME: Pershing Proud  FACILITY NAME: n/a TITLE: n/a  CALLERS RELATIONSHIP:Self  RETURN CALL: Detailed message on voicemail ok      SUBJECT: Letter/Form Request    REASON FOR REQUEST: Please see encounter dated 9/28. They did not receive the fax. Can you please resend? The patient requested that if it is possible, can you make it available on ecare? If so, she can print it out.       WHAT IS THE LETTER/FORM FOR: Other: Excuse from work,04/03/11-04/10/11    WHO SHOULD FILL IT OUT: Sharon Shoemaker, PA  WHERE SHOULD IT BE SENT: Fax: ATTNVernona Rieger: [phone]  DATE NEEDED BY: ASAP

## 2011-04-04 NOTE — Telephone Encounter (Signed)
I called patient and told her I received a confirmation that her work received the letter via fax. I will send again now to ensure they receive it. Patient agreed

## 2011-04-04 NOTE — Telephone Encounter (Signed)
Letter printed and signed. Faxed to # below.

## 2011-04-04 NOTE — Telephone Encounter (Signed)
Patient states she needs dates 04/03/11-04/10/11 attn: [name] [phone]

## 2011-04-17 ENCOUNTER — Ambulatory Visit (INDEPENDENT_AMBULATORY_CARE_PROVIDER_SITE_OTHER): Payer: Medicare Other | Admitting: Physician Assistant

## 2011-04-17 ENCOUNTER — Encounter (INDEPENDENT_AMBULATORY_CARE_PROVIDER_SITE_OTHER): Payer: Self-pay | Admitting: Physician Assistant

## 2011-04-17 VITALS — BP 140/90 | HR 64 | Temp 96.8°F | Resp 16 | Wt 371.0 lb

## 2011-04-17 DIAGNOSIS — E538 Deficiency of other specified B group vitamins: Secondary | ICD-10-CM

## 2011-04-17 DIAGNOSIS — Z975 Presence of (intrauterine) contraceptive device: Secondary | ICD-10-CM

## 2011-04-17 DIAGNOSIS — R0981 Nasal congestion: Secondary | ICD-10-CM

## 2011-04-17 DIAGNOSIS — Z9884 Bariatric surgery status: Secondary | ICD-10-CM

## 2011-04-17 MED ORDER — FLUTICASONE PROPIONATE 50 MCG/ACT NA SUSP
NASAL | Status: DC
Start: 2011-04-17 — End: 2011-10-10

## 2011-04-17 MED ORDER — CETIRIZINE HCL 10 MG OR TABS
ORAL_TABLET | ORAL | Status: DC
Start: 2011-04-17 — End: 2011-10-10

## 2011-04-17 NOTE — Progress Notes (Signed)
Vaccine given today without initial adverse effect. YES    Luisa Gale LPN

## 2011-04-18 NOTE — Progress Notes (Signed)
SUBJECTIVE:  Sharon Stephens is an 35 year old female who presents with runny nose, congestion, sneezing mainly in the am and evening. Seems fine during the day. Seen in Er over the weekend given a nasal spray but not helping( it is OTC but given rx) pt with hx of allergies. She thinks this is the cause  No fever, chills. No teeth, sinus pain.   Not much of cough.   No sob.   No new environment. Moved from [place] couple years ago.     Had gastric bypass about a yr ago.       Outpatient Prescriptions Prior to Visit   Medication Sig Dispense Refill   . Cholecalciferol (VITAMIN D) 2000 UNIT OR CAPS one tablet daily   19m  11   . Gabapentin 300 MG Oral Cap 1 capsule three times a day  120 Cap  0   . Prochlorperazine Maleate 10 MG Oral Tab take one tablet by mouth every 6 hours as needed for nausea/headache  30 Tab  0   . Sertraline HCl 100 MG Oral Tab 1 TABLET DAILY  90 Tab  0   . SUMAtriptan Succinate (IMITREX) 50 MG Oral Tab 1 tablet at headache onset, repeat every 2 hrs. if needed, max 200mg  per 24 hrs.  18 Tab  0   . TraZODone HCl 50 MG Oral Tab 1 TABLET  AT BEDTIME         Review of patient's allergies indicates:  Allergies   Allergen Reactions   . Amoxicillin      Unsure from childhood    . Penicillins      Unsure from childhood        History   Substance Use Topics   . Smoking status: Former Games developer   . Smokeless tobacco: Not on file   . Alcohol Use: No       OBJECTIVE:  BP 140/90  Pulse 64  Temp(Src) 96.8 F (36 C) (Temporal)  Resp 16  Wt 371 lb (168.284 kg)  General appearance: healthy, alert, no distress  Ears: R TM - normal, L TM - normal  Nose: clear secretions in nose, congested. NT over the sinuses  Oropharynx: normal  Neck: supple and no adenopathy  Lungs: clear to auscultation  Heart: normal rate, regular rhythm and no murmurs, clicks, or gallops           ASSESSMENT AND PLAN  478.19 Nasal congestion  (primary encounter diagnosis)  Comment:  Likely allergies. No evidence of focal infeciton  Plan:  Fluticasone Propionate 50 MCG/ACT Nasal         Suspension, Cetirizine HCl 10 MG Oral Tab             266.9 Unspecified vitamin B deficiency  Comment:  Due for her monthly shot    Plan: VITAMIN B12 INJECTION         278.01 Morbid obesity  Comment:    Plan: VITAMIN B12 INJECTION               V45.51 IUD (intrauterine device) in place  Comment:  Noting for hx   Plan: Levonorgestrel 20 MCG/24HR Intrauterine IUD

## 2011-04-21 ENCOUNTER — Ambulatory Visit (INDEPENDENT_AMBULATORY_CARE_PROVIDER_SITE_OTHER): Payer: Self-pay

## 2011-04-21 NOTE — Progress Notes (Signed)
Addended by: Dicie Beam C on: 04/21/2011 05:15 PM     Modules accepted: Orders, Level of Service

## 2011-04-23 ENCOUNTER — Encounter (HOSPITAL_BASED_OUTPATIENT_CLINIC_OR_DEPARTMENT_OTHER): Payer: Medicare Other | Admitting: Orthopaedic Surgery

## 2011-04-24 ENCOUNTER — Ambulatory Visit (INDEPENDENT_AMBULATORY_CARE_PROVIDER_SITE_OTHER): Payer: Self-pay

## 2011-04-25 ENCOUNTER — Ambulatory Visit (HOSPITAL_BASED_OUTPATIENT_CLINIC_OR_DEPARTMENT_OTHER): Payer: Medicare Other | Admitting: Orthopedic Surgery

## 2011-04-25 NOTE — Progress Notes (Signed)
Addended by: Dicie Beam C on: 04/25/2011 05:05 PM     Modules accepted: Level of Service

## 2011-05-05 ENCOUNTER — Telehealth (INDEPENDENT_AMBULATORY_CARE_PROVIDER_SITE_OTHER): Payer: Self-pay | Admitting: Medical

## 2011-05-05 MED ORDER — SUMATRIPTAN SUCCINATE 50 MG OR TABS
ORAL_TABLET | ORAL | Status: DC
Start: 2011-05-05 — End: 2012-05-11

## 2011-05-05 NOTE — Telephone Encounter (Signed)
CONFIRMED PHONE NUMBER: [phone]   CALLERS FIRST AND LAST NAME: Pershing Proud  FACILITY NAME: / TITLE: /  CALLERS RELATIONSHIP:Self  RETURN CALL: Detailed message on voicemail only    SUBJECT: Prescription Management   REASON FOR REQUEST: Patient called to request for Prescription Refill Authorization for the medications below. Please contact patient ASAP. Thanks!    MEDICATION: 2 Prescriptions for Disokin (sp?) for yeast infection and Immitrex (sp?) for migraine  CONCERN/QUESTION: Please see above.  PRESCRIBING PROVIDER: PAC Neysa Koury  DOSE TAKING NOW:   PHARMACY NAME, LOCATION, & PHONE #[facility] [address] [phone] [address] [place] [phone]

## 2011-05-05 NOTE — Telephone Encounter (Signed)
Believe the medication for yeast infection could be "diflucan" or fluconazole.  Routing to RN advice pool to triage the need for this acute medication.

## 2011-05-06 MED ORDER — FLUCONAZOLE 150 MG OR TABS
ORAL_TABLET | ORAL | Status: DC
Start: 2011-05-05 — End: 2011-09-16

## 2011-05-06 NOTE — Telephone Encounter (Signed)
Called patient, she states she recently changed soaps and complains of vaginal itching, no discharge or abdominal pain. Per Neysa ok to send rx for fluconazole into pharmacy. Done, patient notified.

## 2011-05-06 NOTE — Telephone Encounter (Signed)
Pt states she really needs the (yeast) medication.  Pt states the headache medication was authorized.  Pt would like to know about the Rx for yeast infection.

## 2011-05-14 ENCOUNTER — Encounter (HOSPITAL_BASED_OUTPATIENT_CLINIC_OR_DEPARTMENT_OTHER): Payer: Medicare Other | Admitting: Family

## 2011-05-19 ENCOUNTER — Ambulatory Visit (INDEPENDENT_AMBULATORY_CARE_PROVIDER_SITE_OTHER): Payer: Medicare Other | Admitting: Nurse Practitioner

## 2011-05-19 NOTE — Progress Notes (Signed)
This patient failed a scheduled appointment today.  Disposition: Chart reviewed, letter sent regarding follow-up

## 2011-05-26 ENCOUNTER — Ambulatory Visit: Payer: Medicare Other | Attending: Surgery | Admitting: Registered"

## 2011-05-26 ENCOUNTER — Other Ambulatory Visit (INDEPENDENT_AMBULATORY_CARE_PROVIDER_SITE_OTHER): Payer: Self-pay | Admitting: Medical

## 2011-05-26 ENCOUNTER — Ambulatory Visit (HOSPITAL_BASED_OUTPATIENT_CLINIC_OR_DEPARTMENT_OTHER): Payer: Medicare Other | Admitting: Surgery

## 2011-05-26 ENCOUNTER — Other Ambulatory Visit (HOSPITAL_BASED_OUTPATIENT_CLINIC_OR_DEPARTMENT_OTHER): Payer: Self-pay | Admitting: Registered Nurse

## 2011-05-26 DIAGNOSIS — Z9884 Bariatric surgery status: Secondary | ICD-10-CM

## 2011-05-26 LAB — CBC (HEMOGRAM)
Hematocrit: 41 % (ref 36–45)
Hemoglobin: 13.3 g/dL (ref 11.5–15.5)
MCH: 27.9 pg (ref 27.3–33.6)
MCHC: 32.3 g/dL (ref 32.2–36.5)
MCV: 86 fL (ref 81–98)
Platelet Count: 209 10*3/uL (ref 150–400)
RBC: 4.77 mil/uL (ref 3.80–5.00)
RDW-CV: 14 % (ref 11.6–14.4)
WBC: 7.73 10*3/uL (ref 4.3–10.0)

## 2011-05-26 LAB — ALBUMIN, SERUM: Albumin: 3.5 g/dL (ref 3.5–5.2)

## 2011-05-27 MED ORDER — GABAPENTIN 300 MG OR CAPS
ORAL_CAPSULE | ORAL | Status: DC
Start: 2011-05-26 — End: 2011-08-18

## 2011-05-28 ENCOUNTER — Ambulatory Visit (INDEPENDENT_AMBULATORY_CARE_PROVIDER_SITE_OTHER): Payer: Self-pay | Admitting: Medical

## 2011-06-03 ENCOUNTER — Telehealth (INDEPENDENT_AMBULATORY_CARE_PROVIDER_SITE_OTHER): Payer: Self-pay | Admitting: Medical

## 2011-06-03 NOTE — Telephone Encounter (Signed)
Left detailed message, mailed letter to address below. Closing TE

## 2011-06-03 NOTE — Telephone Encounter (Signed)
CONFIRMED PHONE NUMBER: [phone]  CALLERS FIRST AND LAST NAME: Pershing Proud    FACILITY NAME: n/a TITLE: n/a  CALLERS RELATIONSHIP:Self  RETURN CALL: Detailed message on voicemail only    SUBJECT: Letter/Form Request   REASON FOR REQUEST: Patient needs letter for supervisor at work, stating that she needs to sit down intermitently during her work day due to her weight.  Per patient.  WHAT IS THE LETTER/FORM FOR: Other: Patient needs  WHO SHOULD FILL IT OUT: Purnell Shoemaker, [place]    WHERE SHOULD IT BE SENT: mail to patient at home  [address]  [place] [address]    DATE NEEDED BY: asap per patient.

## 2011-06-03 NOTE — Telephone Encounter (Signed)
Signed.

## 2011-06-03 NOTE — Telephone Encounter (Signed)
Composed letter, please review and sign.

## 2011-07-04 ENCOUNTER — Other Ambulatory Visit (INDEPENDENT_AMBULATORY_CARE_PROVIDER_SITE_OTHER): Payer: Self-pay | Admitting: Medical

## 2011-07-06 MED ORDER — SERTRALINE HCL 100 MG OR TABS
ORAL_TABLET | ORAL | Status: DC
Start: 2011-07-04 — End: 2011-11-19

## 2011-07-16 ENCOUNTER — Ambulatory Visit (INDEPENDENT_AMBULATORY_CARE_PROVIDER_SITE_OTHER): Payer: Medicare Other | Admitting: Medical

## 2011-07-16 VITALS — BP 132/84 | HR 88 | Resp 24 | Wt 379.0 lb

## 2011-07-16 DIAGNOSIS — E538 Deficiency of other specified B group vitamins: Secondary | ICD-10-CM

## 2011-07-16 DIAGNOSIS — H01139 Eczematous dermatitis of unspecified eye, unspecified eyelid: Secondary | ICD-10-CM

## 2011-07-16 DIAGNOSIS — N390 Urinary tract infection, site not specified: Secondary | ICD-10-CM

## 2011-07-16 DIAGNOSIS — T148XXA Other injury of unspecified body region, initial encounter: Secondary | ICD-10-CM

## 2011-07-16 LAB — IRON BINDING CAPACITY (W/IRON, TRANSFERRIN & TRANSF SAT)
Iron, SRM: 28 ug/dL — ABNORMAL LOW (ref 40–155)
Total Iron Binding Capacity: 382 ug/dL (ref 270–535)
Transferrin Saturation: 7 % — ABNORMAL LOW (ref 10–45)
Transferrin: 273 mg/dL (ref 192–382)

## 2011-07-16 LAB — CBC (HEMOGRAM)
Hematocrit: 38 % (ref 36–45)
Hemoglobin: 12.3 g/dL (ref 11.5–15.5)
MCH: 28.5 pg (ref 27.3–33.6)
MCHC: 32.7 g/dL (ref 32.2–36.5)
MCV: 87 fL (ref 81–98)
Platelet Count: 228 10*3/uL (ref 150–400)
RBC: 4.32 mil/uL (ref 3.80–5.00)
RDW-CV: 13.9 % (ref 11.6–14.4)
WBC: 7.79 10*3/uL (ref 4.3–10.0)

## 2011-07-16 MED ORDER — NITROFURANTOIN MACROCRYSTAL 50 MG OR CAPS
ORAL_CAPSULE | ORAL | Status: DC
Start: 2011-07-16 — End: 2012-03-16

## 2011-07-16 MED ORDER — DESONIDE 0.05 % EX OINT
TOPICAL_OINTMENT | CUTANEOUS | Status: DC
Start: 2011-07-16 — End: 2011-10-10

## 2011-07-16 NOTE — Progress Notes (Signed)
1:59 PM  Sharon Stephens  is a 36 year old female  with the following concerns:    Due for B 12 injection   Has been working outdoor more and has dry skin around eyes with a burning sensation   She has no history of eczema  States she has always bruised easily but it is getting worse  She is frustrated she has gained 15 pounds since last month     Last summer was seen by Dr. Wilkie Aye for frequent UTI related to intercourse    She has not had a recurrence of a UTI since taking Macrobid when she has sex and would like a refill       Current Outpatient Prescriptions   Medication Sig   . Cetirizine HCl 10 MG Oral Tab 1 TABLET DAILY   . Cholecalciferol (VITAMIN D) 2000 UNIT OR CAPS one tablet daily    . Desonide 0.05 % External Ointment apply to dry patches once a night, use only until dryness resolves.   . Fluconazole 150 MG Oral Tab 1 TABLET 1 TIME ONLY   . Fluticasone Propionate 50 MCG/ACT Nasal Suspension 1-2 puffs each nostril  twice daily for prevention/control of nasal/sinus congestion   . Gabapentin 300 MG Oral Cap TAKE ONE CAPSULE BY MOUTH THREE TIMES DAILY   . Levonorgestrel 20 MCG/24HR Intrauterine IUD as directed   . Nitrofurantoin Macrocrystal 50 MG Oral Cap 1 CAPSULE with intercourse for postcoital prophalaxis   . Prochlorperazine Maleate 10 MG Oral Tab take one tablet by mouth every 6 hours as needed for nausea/headache   . Sertraline HCl 100 MG Oral Tab TAKE 1 TABLET BY MOUTH DAILY   . SUMAtriptan Succinate (IMITREX) 50 MG Oral Tab 1 tablet at headache onset, repeat every 2 hrs. if needed, max 200mg  per 24 hrs.   . TraZODone HCl 50 MG Oral Tab 1 TABLET  AT BEDTIME     Review of patient's allergies indicates:  Allergies   Allergen Reactions   . Amoxicillin      Unsure from childhood    . Penicillins      Unsure from childhood      Patient Active Problem List   Diagnoses   . BENIGN HYPERTENSION   . MIGRAINE NOS INTRACTABLE   . MORBID OBESITY   . CHEST PAIN NOS   . NONSPEC ABNL PAP SMEAR CERVIX, UNSPEC   .  Bariatric surgery status   . IUD (intrauterine device) in place     OBJECTIVE:  BP 132/84  Pulse 88  Resp 24  Wt 379 lb (171.913 kg)  GENERAL: obese, well nourished patient in no acute distress.  Alert and oriented x3.    SKIN:  Fine scaling skin around eyes     924.9 Bruising  (primary encounter diagnosis)  Plan: CBC (HEMOGRAM), IRON BINDING CAPACITY/TOT IRON      373.31 Atopic dermatitis of eyelid    Plan: Desonide 0.05 % External Ointment        Use sparingly, use heavy emollient     599.0 UTI (lower urinary tract infection)    Plan: Nitrofurantoin Macrocrystal 50 MG Oral Cap with intercourse       266.2 B12 deficiency    Plan: VITAMIN B12 INJECTION

## 2011-07-16 NOTE — Progress Notes (Signed)
Why is patient here today? Patient complains of dry skin around face. patient also needs b12 injection.     Any new significant medical diagnoses since last visit? NO    Refills? NO    Referral? NO    Letter or form? NO    Lab results? NO    Health maintenance issues? NO      Patient/Parent has reviewed the appropriate VIS and has all questions answered? YES    Vaccine given today without initial adverse effect. Dorise Hiss MA

## 2011-07-18 ENCOUNTER — Encounter (INDEPENDENT_AMBULATORY_CARE_PROVIDER_SITE_OTHER): Payer: Self-pay | Admitting: Medical

## 2011-07-18 ENCOUNTER — Other Ambulatory Visit (INDEPENDENT_AMBULATORY_CARE_PROVIDER_SITE_OTHER): Payer: Self-pay | Admitting: Medical

## 2011-07-18 DIAGNOSIS — D649 Anemia, unspecified: Secondary | ICD-10-CM

## 2011-07-18 MED ORDER — FERROUS FUMARATE 325 (106 FE) MG OR TABS
ORAL_TABLET | ORAL | Status: DC
Start: 2011-07-18 — End: 2011-10-10

## 2011-07-18 NOTE — Addendum Note (Signed)
Addended by: Nicholes Stairs on: 07/18/2011 09:56 AM     Modules accepted: Orders

## 2011-08-04 ENCOUNTER — Ambulatory Visit (INDEPENDENT_AMBULATORY_CARE_PROVIDER_SITE_OTHER): Payer: Medicare Other | Admitting: Medical

## 2011-08-04 ENCOUNTER — Encounter (INDEPENDENT_AMBULATORY_CARE_PROVIDER_SITE_OTHER): Payer: Self-pay | Admitting: Medical

## 2011-08-04 VITALS — BP 149/88 | HR 88 | Temp 96.6°F | Wt 374.0 lb

## 2011-08-04 DIAGNOSIS — R1013 Epigastric pain: Secondary | ICD-10-CM

## 2011-08-04 DIAGNOSIS — R109 Unspecified abdominal pain: Secondary | ICD-10-CM

## 2011-08-04 DIAGNOSIS — D649 Anemia, unspecified: Secondary | ICD-10-CM

## 2011-08-04 DIAGNOSIS — Z9884 Bariatric surgery status: Secondary | ICD-10-CM

## 2011-08-04 NOTE — Progress Notes (Signed)
Patient presents to follow up low iron.
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
## 2011-08-06 ENCOUNTER — Emergency Department
Admission: EM | Admit: 2011-08-06 | Discharge: 2011-08-07 | Disposition: A | Discharge status: Home / Self Care | Payer: Medicare Other | Attending: Emergency Medicine | Admitting: Emergency Medicine

## 2011-08-06 ENCOUNTER — Other Ambulatory Visit (EMERGENCY_DEPARTMENT_HOSPITAL): Payer: Self-pay | Admitting: Internal Medicine

## 2011-08-06 DIAGNOSIS — I10 Essential (primary) hypertension: Secondary | ICD-10-CM | POA: Insufficient documentation

## 2011-08-06 DIAGNOSIS — R5381 Other malaise: Secondary | ICD-10-CM | POA: Insufficient documentation

## 2011-08-06 DIAGNOSIS — R5383 Other fatigue: Secondary | ICD-10-CM | POA: Insufficient documentation

## 2011-08-06 LAB — CBC, DIFF
% Basophils: 0 % (ref 0–1)
% Eosinophils: 1 % (ref 0–7)
% Immature Granulocytes: 0 % (ref 0–1)
% Lymphocytes: 32 % (ref 19–53)
% Monocytes: 5 % (ref 5–13)
% Neutrophils: 62 % (ref 34–71)
Absolute Eosinophil Count: 0.08 10*3/uL (ref 0.00–0.50)
Absolute Lymphocyte Count: 2.57 10*3/uL (ref 1.00–4.80)
Basophils: 0.03 10*3/uL (ref 0.00–0.20)
Hematocrit: 37 % (ref 36–45)
Hemoglobin: 11.9 g/dL (ref 11.5–15.5)
Immature Granulocytes: 0.01 10*3/uL (ref 0.00–0.05)
MCH: 27.8 pg (ref 27.3–33.6)
MCHC: 32.4 g/dL (ref 32.2–36.5)
MCV: 86 fL (ref 81–98)
Monocytes: 0.43 10*3/uL (ref 0.00–0.80)
Neutrophils: 4.95 10*3/uL (ref 1.80–7.00)
Platelet Count: 155 10*3/uL (ref 150–400)
RBC: 4.28 mil/uL (ref 3.80–5.00)
RDW-CV: 13.6 % (ref 11.6–14.4)
WBC: 8.07 10*3/uL (ref 4.3–10.0)

## 2011-08-06 LAB — COMPREHENSIVE METABOLIC PANEL
ALT (GPT): 10 U/L (ref 6–40)
AST (GOT): 12 U/L — ABNORMAL LOW (ref 15–40)
Albumin: 3.3 g/dL — ABNORMAL LOW (ref 3.5–5.2)
Alkaline Phosphatase (Total): 61 U/L (ref 25–100)
Anion Gap: 7 (ref 3–11)
Bilirubin (Total): 0.5 mg/dL (ref 0.2–1.3)
Calcium: 8.8 mg/dL — ABNORMAL LOW (ref 8.9–10.2)
Carbon Dioxide, Total: 25 mEq/L (ref 22–32)
Chloride: 105 mEq/L (ref 98–108)
Creatinine: 0.52 mg/dL (ref 0.38–1.02)
GFR, Calc, African American: >60 mL/min (ref >59)
GFR, Calc, European American: >60 mL/min (ref >59)
Glucose: 89 mg/dL (ref 62–125)
Potassium: 3.6 mEq/L — ABNORMAL LOW (ref 3.7–5.2)
Protein (Total): 5.9 g/dL — ABNORMAL LOW (ref 6.0–8.2)
Sodium: 137 mEq/L (ref 136–145)
Urea Nitrogen: 12 mg/dL (ref 8–21)

## 2011-08-06 LAB — 1ST EXTRA BLUE TOP

## 2011-08-06 LAB — 1ST EXTRA GOLD TOP

## 2011-08-06 LAB — 1ST EXTRA PEARL TOP

## 2011-08-06 LAB — 1ST EXTRA RED TOP

## 2011-08-07 LAB — INFLUENZA A,B,RSV_RAPID PCR
INFLUENZA A: NOT DETECTED
INFLUENZA B: NOT DETECTED
RESPIRATORY SYNCYTIAL VIRUS: NOT DETECTED

## 2011-08-07 LAB — URINALYSIS COMPLETE, URN
Bilirubin (Qual), URN: NEGATIVE
Epith Cells_Renal/Trans,URN: NEGATIVE /HPF
Glucose Qual, URN: NEGATIVE mg/dL
Ketones, URN: NEGATIVE mg/dL
Leukocyte Esterase, URN: NEGATIVE
Nitrite, URN: NEGATIVE
Occult Blood, URN: NEGATIVE
Protein (Alb Semiquant), URN: NEGATIVE mg/dL
RBC, URN: NEGATIVE /HPF
Specific Gravity, URN: 1.011 g/mL (ref 1.002–1.027)
WBC, URN: NEGATIVE /HPF

## 2011-08-07 NOTE — Addendum Note (Signed)
Addended by: Verline Lema on: 08/07/2011 03:31 PM     Modules accepted: Orders

## 2011-08-09 ENCOUNTER — Encounter (INDEPENDENT_AMBULATORY_CARE_PROVIDER_SITE_OTHER): Payer: Self-pay | Admitting: Family Medicine

## 2011-08-09 ENCOUNTER — Ambulatory Visit (INDEPENDENT_AMBULATORY_CARE_PROVIDER_SITE_OTHER): Payer: Medicare Other | Admitting: Family Medicine

## 2011-08-09 VITALS — BP 158/96 | HR 70 | Temp 98.1°F | Wt 370.0 lb

## 2011-08-09 DIAGNOSIS — J069 Acute upper respiratory infection, unspecified: Secondary | ICD-10-CM

## 2011-08-09 NOTE — Progress Notes (Signed)
Why is patient here today? Patient is here c/o productive cough, congestion, sore throat x 6 days.   Does patient need refills? no  Does patient need a referral? no  Does patient need a letter or need forms filled out? no  Lab results? no  Please check health mainenance issues and flag for the doctor: UTD

## 2011-08-09 NOTE — Progress Notes (Signed)
SUBJECTIVE:  Sharon Stephens is a 36 year old female presents with nonproductive cough, nasal congestion, sore throat and headache over the last 5 days.  Denies the following sx: wheezing, SOB, fever, chills and sweats.  Pt feels overall has been stable.  Sick contacts:YES .  Pt has self-treated so far with otc meds.    Patient Active Problem List   Diagnoses   . BENIGN HYPERTENSION   . MIGRAINE NOS INTRACTABLE   . MORBID OBESITY   . CHEST PAIN NOS   . NONSPEC ABNL PAP SMEAR CERVIX, UNSPEC   . Bariatric surgery status   . IUD (intrauterine device) in place     Current Outpatient Prescriptions   Medication Sig   . Cetirizine HCl 10 MG Oral Tab 1 TABLET DAILY   . Cholecalciferol (VITAMIN D) 2000 UNIT OR CAPS one tablet daily    . Desonide 0.05 % External Ointment apply to dry patches once a night, use only until dryness resolves.   . Ferrous Fumarate 325 (106 FE) MG Oral Tab 1 tab daily   . Fluconazole 150 MG Oral Tab 1 TABLET 1 TIME ONLY   . Fluticasone Propionate 50 MCG/ACT Nasal Suspension 1-2 puffs each nostril  twice daily for prevention/control of nasal/sinus congestion   . Gabapentin 300 MG Oral Cap TAKE ONE CAPSULE BY MOUTH THREE TIMES DAILY   . Levonorgestrel 20 MCG/24HR Intrauterine IUD as directed   . Nitrofurantoin Macrocrystal 50 MG Oral Cap 1 CAPSULE with intercourse for postcoital prophalaxis   . Prochlorperazine Maleate 10 MG Oral Tab take one tablet by mouth every 6 hours as needed for nausea/headache   . Sertraline HCl 100 MG Oral Tab TAKE 1 TABLET BY MOUTH DAILY   . SUMAtriptan Succinate (IMITREX) 50 MG Oral Tab 1 tablet at headache onset, repeat every 2 hrs. if needed, max 200mg  per 24 hrs.   . TraZODone HCl 50 MG Oral Tab 1 TABLET  AT BEDTIME     History   Smoking status   . Former Smoker   Smokeless tobacco   . Not on file       OBJECTIVE:  Constitutional: VS as above, alert, no distress  ENT: Ears:  R TM: normal, L TM: normal, Nose/sinus: positive findings: mucosa erythematous and swollen,  clear rhinorrhea,  Oropharynx: normal, Neck: supple and no adenopathy  Lungs: clear to auscultation  Heart: normal rate, regular rhythm and no murmurs, clicks, or gallops    Rapid strep screen done: NO    ASSESSMENT: Viral upper respiratory infection    PLAN:  reviewed symptomatic treatment  Followup if worse or no improvement in 7 days

## 2011-08-10 ENCOUNTER — Encounter (INDEPENDENT_AMBULATORY_CARE_PROVIDER_SITE_OTHER): Payer: Self-pay | Admitting: Family Medicine

## 2011-08-12 ENCOUNTER — Emergency Department
Admission: EM | Admit: 2011-08-12 | Discharge: 2011-08-12 | Disposition: A | Discharge status: Home / Self Care | Payer: Medicare Other | Attending: Emergency Medicine | Admitting: Emergency Medicine

## 2011-08-12 ENCOUNTER — Other Ambulatory Visit (EMERGENCY_DEPARTMENT_HOSPITAL): Payer: Self-pay

## 2011-08-12 ENCOUNTER — Other Ambulatory Visit (EMERGENCY_DEPARTMENT_HOSPITAL): Payer: Self-pay | Admitting: Emergency Medicine

## 2011-08-12 ENCOUNTER — Telehealth (INDEPENDENT_AMBULATORY_CARE_PROVIDER_SITE_OTHER): Payer: Self-pay | Admitting: Medical

## 2011-08-12 DIAGNOSIS — R197 Diarrhea, unspecified: Secondary | ICD-10-CM | POA: Insufficient documentation

## 2011-08-12 DIAGNOSIS — R109 Unspecified abdominal pain: Secondary | ICD-10-CM | POA: Insufficient documentation

## 2011-08-12 LAB — 1ST EXTRA RED TOP

## 2011-08-12 LAB — CBC (HEMOGRAM)
Hematocrit: 39 % (ref 36–45)
Hemoglobin: 12.9 g/dL (ref 11.5–15.5)
MCH: 28.4 pg (ref 27.3–33.6)
MCHC: 33.4 g/dL (ref 32.2–36.5)
MCV: 85 fL (ref 81–98)
Platelet Count: 236 10*3/uL (ref 150–400)
RBC: 4.55 mil/uL (ref 3.80–5.00)
RDW-CV: 13.4 % (ref 11.6–14.4)
WBC: 11.59 10*3/uL — ABNORMAL HIGH (ref 4.3–10.0)

## 2011-08-12 LAB — COMPREHENSIVE METABOLIC PANEL
ALT (GPT): 14 U/L (ref 6–40)
AST (GOT): 18 U/L (ref 15–40)
Albumin: 3.5 g/dL (ref 3.5–5.2)
Alkaline Phosphatase (Total): 66 U/L (ref 25–100)
Anion Gap: 7 (ref 3–11)
Bilirubin (Total): 0.4 mg/dL (ref 0.2–1.3)
Calcium: 8.8 mg/dL — ABNORMAL LOW (ref 8.9–10.2)
Carbon Dioxide, Total: 25 mEq/L (ref 22–32)
Chloride: 104 mEq/L (ref 98–108)
Creatinine: 0.57 mg/dL (ref 0.38–1.02)
GFR, Calc, African American: >60 mL/min (ref >59)
GFR, Calc, European American: >60 mL/min (ref >59)
Glucose: 102 mg/dL (ref 62–125)
Potassium: 4 mEq/L (ref 3.7–5.2)
Protein (Total): 6.9 g/dL (ref 6.0–8.2)
Sodium: 136 mEq/L (ref 136–145)
Urea Nitrogen: 11 mg/dL (ref 8–21)

## 2011-08-12 LAB — 1ST EXTRA LIME GREEN TOP

## 2011-08-12 LAB — 1ST EXTRA LAVENDER TOP

## 2011-08-12 LAB — 1ST EXTRA BLUE TOP

## 2011-08-12 NOTE — Telephone Encounter (Signed)
CONFIRMED PHONE NUMBER:   Telephone Information:   Home Phone [phone]   Work Phone [phone]   Mobile [phone]     CALLERS FIRST AND LAST NAME: Pershing Proud    FACILITY NAME: [facility] TITLE: na  CALLERS RELATIONSHIP:Self  RETURN CALL: Detailed message on voicemail only    SUBJECT: General Message   REASON FOR REQUEST: per patient    MESSAGE: patient called in about stomach pains, sharp pains since 9:30 am today.  Pt stated the pain is getting stronger as the day goes on.  Pt had bariatric surgery in 2011, but does remember pain like this.  Pt would like some advice asap. Please advise, thank you.

## 2011-08-12 NOTE — Telephone Encounter (Signed)
Thank you :)

## 2011-08-12 NOTE — Telephone Encounter (Signed)
Triage Nurse Telephone Encounter    Chief Complaint: abdominal pain    ROS PER PROTOCOL: c/o abdominal pain since 9:30am today. Describes as constant  but also increases in intensity every 10-15 minutes.  Rates # 7-8 on 1-10 scale.  Radiates to under left rib. 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  No vomiting or diarrhea.  Is not sexually active now but has IUD.  Reviewed Problem List, Medications: YES  Protocol used for assessment: abdominal pain  Home care instructions provided:  NO   Disposition/PLAN: Go to nearest emergency room (because of level of pain).  Caller agrees:  YES    Instructed to call back or seek tx if sxs worsen or has new concerns: YES   Caller understands:  YES  Follow up call needed:forwarding call to tomorrow for follow-up.  Reference used: Eliot Ford 3rd edition, Telephone Triage Protocols for Nurses

## 2011-08-18 ENCOUNTER — Other Ambulatory Visit (INDEPENDENT_AMBULATORY_CARE_PROVIDER_SITE_OTHER): Payer: Self-pay | Admitting: Medical

## 2011-08-19 MED ORDER — GABAPENTIN 300 MG OR CAPS
ORAL_CAPSULE | ORAL | Status: DC
Start: 2011-08-18 — End: 2012-03-16

## 2011-09-01 ENCOUNTER — Encounter (INDEPENDENT_AMBULATORY_CARE_PROVIDER_SITE_OTHER): Payer: Self-pay | Admitting: Medical

## 2011-09-01 ENCOUNTER — Ambulatory Visit (INDEPENDENT_AMBULATORY_CARE_PROVIDER_SITE_OTHER): Payer: Medicare Other | Admitting: Medical

## 2011-09-01 VITALS — BP 135/85 | HR 83 | Resp 12 | Wt 375.0 lb

## 2011-09-01 DIAGNOSIS — M25562 Pain in left knee: Secondary | ICD-10-CM

## 2011-09-01 DIAGNOSIS — F339 Major depressive disorder, recurrent, unspecified: Secondary | ICD-10-CM

## 2011-09-01 DIAGNOSIS — E539 Vitamin B deficiency, unspecified: Secondary | ICD-10-CM

## 2011-09-01 DIAGNOSIS — R202 Paresthesia of skin: Secondary | ICD-10-CM

## 2011-09-01 DIAGNOSIS — R209 Unspecified disturbances of skin sensation: Secondary | ICD-10-CM

## 2011-09-01 DIAGNOSIS — M25569 Pain in unspecified knee: Secondary | ICD-10-CM

## 2011-09-01 DIAGNOSIS — D649 Anemia, unspecified: Secondary | ICD-10-CM

## 2011-09-01 MED ORDER — ZOLPIDEM TARTRATE 10 MG OR TABS
ORAL_TABLET | ORAL | Status: DC
Start: 2011-09-01 — End: 2011-10-10

## 2011-09-01 NOTE — Progress Notes (Signed)
Why is patient here today? Patient complains of numbness in left hand and left knee pain.     Any new significant medical diagnoses since last visit? YES bronchitis    Refills? NO    Referral? NO    Letter or form? NO    Lab results? NO    Health maintenance issues? NO

## 2011-09-01 NOTE — Patient Instructions (Addendum)
Index    Index    Index       Index Illustration Illustration         Patellar Tendonitis (Jumper's Knee) Rehabilitation Exercises   You can do the first 4 exercises right away. When you have less pain in your knee, you can do the remaining exercises.    Standing hamstring stretch: Place the heel of your injured leg on a stool about 15 inches high. Keep your leg straight. Lean forward, bending at the hips until you feel a mild stretch in the back of your thigh. Make sure you do not roll your shoulders and bend at the waist when doing this or you will stretch your lower back instead of your leg. Hold the stretch for 15 to 30 seconds. Repeat 3 times.    Quadriceps stretch: Stand an arm's length away from the wall with your injured leg farthest from the wall. Facing straight ahead, brace yourself by keeping one hand against the wall. With your other hand, grasp the ankle of your injured leg and pull your heel toward your buttocks. Don't arch or twist your back. Keep your knees together. Hold this stretch for 15 to 30 seconds.    Side-lying leg lift: Lie on your uninjured side. Tighten the front thigh muscles on your injured leg and lift that leg 8 to 10 inches away from the other leg. Keep the leg straight and lower it slowly. Do 3 sets of 10.    Straight leg raise: Lie on your back with your legs straight out in front of you. Bend the knee on your uninjured side and place the foot flat on the floor. Tighten the thigh muscle on your injured side and lift your leg about 8 inches off the floor. Keep your leg straight and your thigh muscle tight. Slowly lower your leg back down to the floor. Do 3 sets of 10.    Step-up: Stand with the foot of your injured leg on a support 3 to 5 inches high (like a small step or block of wood). Keep your other foot flat on the floor. Shift your weight onto the injured leg on the support. Straighten your injured leg as the other leg comes off the floor. Return to the starting position  by bending your injured leg and slowly lowering your uninjured leg back to the floor. Do 3 sets of 10.    Wall squat with a ball: Stand with your back, shoulders, and head against a wall. Look straight ahead. Keep your shoulders relaxed and your feet 3 feet from the wall and shoulder's width apart. Place a soccer or basketball-sized ball behind your back. Keeping your back against the wall, slowly squat down to a 45-degree angle. Your thighs will not yet be parallel to the floor. Hold this position for 10 seconds and then slowly slide back up the wall. Repeat 10 times. Build up to 3 sets of 10.    Knee stabilization: Wrap a piece of elastic tubing around the ankle of the uninjured leg. Tie a knot in the other end of the tubing and close it in a door.   A. Stand facing the door on the leg without tubing and bend your knee slightly, keeping your thigh muscles tight. While maintaining this position, move the leg with the tubing straight back behind you. Do 3 sets of 10.   B. Turn 90 degrees so the leg without tubing is closest to the door. Move the leg with tubing  away from your body. Do 3 sets of 10.   C. Turn 90 degrees again so your back is to the door. Move the leg with tubing straight out in front of you. Do 3 sets of 10.   D. Turn your body 90 degrees again so the leg with tubing is closest to the door. Move the leg with tubing across your body. Do 3 sets of 10.   Hold onto a chair if you need help balancing. This exercise can be made even more challenging by standing on a pillow while you move the leg with tubing.    Resisted terminal knee extension: Make a loop from a piece of elastic tubing by tying a knot in both ends. Close both knots in a door. Step into the loop so the tubing is around the back of your injured leg. Lift the other foot off the ground. Hold onto a chair for balance, if needed. Bend the knee on the leg with tubing about 45 degrees. Slowly straighten your leg, keeping your thigh muscle  tight as you do this. Do this 10 times. Do 3 sets. An easier way to do this is to stand on both legs for better support while you do the exercise.    Decline eccentric squat: Stand with both feet on an angled platform or with your heels on a 3-inch-high board. Put all your weight on your injured leg and squat down to a 45-degree angle. Use your other leg to help you return to a standing position from the squat. You should lower your body to a squat using only your injured leg but you can return to an upright position using both legs. When this exercise gets easy, hold weights in your hands to make the exercise more difficult. Do 3 sets of 10.   Written by Modesta Messing, MS, PT, and Iris Pert, PT, DHSc, OCS, for [employer].   Published by [employer].   Last modified: 2008-02-24   Last reviewed: 2007-06-07   This content is reviewed periodically and is subject to change as new health information becomes available. The information is intended to inform and educate and is not a replacement for medical evaluation, advice, diagnosis or treatment by a healthcare professional.   Sports Medicine Advisor 2009.3 Index  Sports Medicine Advisor 2009.3 Credits    2009 [employer] and/or its affiliates. All Rights Reserved.

## 2011-09-01 NOTE — Progress Notes (Signed)
4:24 PM  Sharon Stephens  is a 36 year old female  with the following concerns:     Left hand is numb in the am - off and on for the past 2-3 months, becoming more frequently, will also occur occasionally if she holds phone in left hand   She is right handed   This involves all fingers and thumb, not in arm   Sensation lasts 10-15 mins, moves hand to wake it up   She thinks the problem occurred in December when she injured her thumb when she hyperextended it     Left knee pain for one week since she started doing treadmill and exercise on bike, hurts worse at night, localizes pain to front of knee   Current Outpatient Prescriptions   Medication Sig   . Cetirizine HCl 10 MG Oral Tab 1 TABLET DAILY   . Cholecalciferol (VITAMIN D) 2000 UNIT OR CAPS one tablet daily    . Desonide 0.05 % External Ointment apply to dry patches once a night, use only until dryness resolves.   . Ferrous Fumarate 325 (106 FE) MG Oral Tab 1 tab daily   . Fluconazole 150 MG Oral Tab 1 TABLET 1 TIME ONLY   . Fluticasone Propionate 50 MCG/ACT Nasal Suspension 1-2 puffs each nostril  twice daily for prevention/control of nasal/sinus congestion   . Gabapentin 300 MG Oral Cap TAKE ONE CAPSULE BY MOUTH THREE TIMES DAILY   . Levonorgestrel 20 MCG/24HR Intrauterine IUD as directed   . Nitrofurantoin Macrocrystal 50 MG Oral Cap 1 CAPSULE with intercourse for postcoital prophalaxis   . Prochlorperazine Maleate 10 MG Oral Tab take one tablet by mouth every 6 hours as needed for nausea/headache   . Sertraline HCl 100 MG Oral Tab TAKE 1 TABLET BY MOUTH DAILY   . SUMAtriptan Succinate (IMITREX) 50 MG Oral Tab 1 tablet at headache onset, repeat every 2 hrs. if needed, max 200mg  per 24 hrs.   . TraZODone HCl 50 MG Oral Tab 1 TABLET  AT BEDTIME     Review of patient's allergies indicates:  Allergies   Allergen Reactions   . Amoxicillin      Unsure from childhood    . Penicillins      Unsure from childhood      Patient Active Problem List   Diagnosis   .  BENIGN HYPERTENSION   . MIGRAINE NOS INTRACTABLE   . MORBID OBESITY   . CHEST PAIN NOS   . NONSPEC ABNL PAP SMEAR CERVIX, UNSPEC   . Bariatric surgery status   . IUD (intrauterine device) in place     OBJECTIVE:  BP 135/85  Pulse 83  Resp 12  Wt 375 lb (170.099 kg)  GENERAL: obese, well nourished patient in no acute distress.  Alert and oriented x3.    MSK:  No visible deformities of hand or wrist. +  joint tenderness in thenar area, normal grip strength   Knee anatomy distorted due to body habitus, endorses pain to extend leg, endorses pain with palpation over joint line   NEURO: Normal gait, stations and coordination.      782.0 Numbness and tingling in hands  (primary encounter diagnosis)  Plan: REFERRAL TO ELECTRODIAGNOSTIC, GLUCOSE SERUM,         FASTING  Check for DM, do not sleep on left side, rehab exersises     285.9 Anemia    Plan: CBC (HEMOGRAM), IRON BINDING CAPACITY/TOT IRON,        GLUCOSE  SERUM, FASTING      719.46 Left knee pain  Comment: may be due to increased activity. I suspect X ray will show degenerative changes   Plan: X-RAY KNEE 3 VIEW LEFT  Ice/heat, rehab exercises, NSAIDS      V45.86 Bariatric surgery status    Plan: VITAMIN B12 INJECTION, GLUCOSE SERUM, FASTING      296.30 Major depressive disorder, recurrent episode, unspecified    Plan: Zolpidem Tartrate (AMBIEN) 10 MG Oral Tab

## 2011-09-16 ENCOUNTER — Telehealth (INDEPENDENT_AMBULATORY_CARE_PROVIDER_SITE_OTHER): Payer: Self-pay | Admitting: Medical

## 2011-09-16 DIAGNOSIS — B3731 Acute candidiasis of vulva and vagina: Secondary | ICD-10-CM

## 2011-09-16 MED ORDER — FLUCONAZOLE 150 MG OR TABS
ORAL_TABLET | ORAL | Status: DC
Start: 2011-09-16 — End: 2011-10-10

## 2011-09-16 NOTE — Telephone Encounter (Signed)
CONFIRMED PHONE NUMBER: [phone]  CALLERS FIRST AND LAST NAME: Shellee Milo  FACILITY NAME: NA TITLE: NA  CALLERS RELATIONSHIP:Self  RETURN CALL: Detailed message on voicemail only    SUBJECT: General Message   REASON FOR REQUEST: Yeast infection    MESSAGE: Patient is requesting a prescription be called in for a yeast infection. States she has had a prescription from [facility] for this before. Please call in to [facility], selected below.

## 2011-09-16 NOTE — Telephone Encounter (Signed)
Called Pt back regarding her phone call to the clinic. Called Pt to let her know the below plan of care per [facility]:    Authorized 1 refill of Fluconazole  Fluconazole 150 MG Oral Tab - 1 TABLET 1 TIME ONLY - Oral  [id number] [address] [place] [phone] [id number]    Pt to return to clinic for eval and f/u with [facility] if symptoms persist and/or worsening symptoms despite medication.    Pt verbalized understanding. Pt with no further questions or concerns.  Pt instructed to call the clinic with any further questions or concerns.    Marval Regal, OB/GYN RN

## 2011-09-16 NOTE — Telephone Encounter (Signed)
Sharon Stephens: Pt requesting to be tx over the phone for yeast infection symptoms. Last prescribed fluconazole for same on 05/05/2011. Ok for refill or do you recommend office visit for evaluation? Thanks!

## 2011-09-16 NOTE — Telephone Encounter (Signed)
Please refill and make an appointment to be seen of symptoms persist.

## 2011-09-29 ENCOUNTER — Ambulatory Visit (INDEPENDENT_AMBULATORY_CARE_PROVIDER_SITE_OTHER): Payer: Medicare Other | Admitting: Medical

## 2011-09-29 NOTE — Progress Notes (Signed)
This patient failed a scheduled appointment today.  Disposition: no show letter sent

## 2011-09-30 ENCOUNTER — Encounter (INDEPENDENT_AMBULATORY_CARE_PROVIDER_SITE_OTHER): Payer: Self-pay | Admitting: Medical

## 2011-09-30 DIAGNOSIS — Z5900 Homelessness unspecified: Secondary | ICD-10-CM | POA: Insufficient documentation

## 2011-09-30 DIAGNOSIS — T1491XA Suicide attempt, initial encounter: Secondary | ICD-10-CM | POA: Insufficient documentation

## 2011-10-09 ENCOUNTER — Ambulatory Visit (INDEPENDENT_AMBULATORY_CARE_PROVIDER_SITE_OTHER): Payer: Medicare Other | Admitting: Medical

## 2011-10-10 ENCOUNTER — Ambulatory Visit (INDEPENDENT_AMBULATORY_CARE_PROVIDER_SITE_OTHER): Payer: Medicare Other | Admitting: Medical

## 2011-10-10 ENCOUNTER — Encounter (INDEPENDENT_AMBULATORY_CARE_PROVIDER_SITE_OTHER): Payer: Self-pay | Admitting: Medical

## 2011-10-10 VITALS — BP 133/87 | HR 74 | Resp 22 | Wt 369.0 lb

## 2011-10-10 DIAGNOSIS — M79609 Pain in unspecified limb: Secondary | ICD-10-CM

## 2011-10-10 DIAGNOSIS — X838XXA Intentional self-harm by other specified means, initial encounter: Secondary | ICD-10-CM

## 2011-10-10 DIAGNOSIS — T1491XA Suicide attempt, initial encounter: Secondary | ICD-10-CM

## 2011-10-10 DIAGNOSIS — Z9884 Bariatric surgery status: Secondary | ICD-10-CM

## 2011-10-10 DIAGNOSIS — M79642 Pain in left hand: Secondary | ICD-10-CM

## 2011-10-10 DIAGNOSIS — N390 Urinary tract infection, site not specified: Secondary | ICD-10-CM

## 2011-10-10 LAB — PR U/A AUTO DIPSTICK ONLY, ONSITE
Bilirubin (Indirect): NEGATIVE
Glucose, Urine: NEGATIVE mg/dL
Ketones, URN: NEGATIVE mg/dL
Nitrite, URN: NEGATIVE
Protein: NEGATIVE mg/dL
Specific Gravity, URN: 1.025 (ref 1.005–1.030)
Urobilinogen, URN: NORMAL E.U./dL
pH, Whole Blood: 6.5 (ref 5.0–8.0)

## 2011-10-10 MED ORDER — NITROFURANTOIN MACROCRYSTAL 50 MG OR CAPS
ORAL_CAPSULE | ORAL | Status: DC
Start: 2011-10-10 — End: 2012-06-09

## 2011-10-10 MED ORDER — SULFAMETHOXAZOLE-TRIMETHOPRIM 800-160 MG OR TABS
ORAL_TABLET | ORAL | Status: AC
Start: 2011-10-10 — End: 2011-10-19

## 2011-10-10 MED ORDER — PRENATAL FORMULA 27-1 MG OR TABS
ORAL_TABLET | ORAL | Status: DC
Start: 2011-10-10 — End: 2012-01-07

## 2011-10-10 NOTE — Progress Notes (Signed)
3:21 PM  Sharon Stephens  is a 36 year old female  with the following concerns:    Recent SA- was in [place] for about 1 week. States in general her life is chaos. Her children are still not with her. 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 She is now living her and there. States everything is getting back to normal and she hopes to get back into [facility]. She states she no longer has SA. She has not used ETOH for one week. She states she was "wasted" when she made the SA and was drinking ETOH and energy drinks and has no other recollection of the night. She denies current depression     She used to be seen at [facility] for mental health services and no longer feels she needs mental health care.     The past two nights she has been having increased urinary urgency, no dysuria, no hematuria, this am her urine was dark, she is out of antibiotic to take post coitus   No fever, no flank pain     Left hand pain for years. Pain has been helped in the past with steroid injections       Current Outpatient Prescriptions   Medication Sig   . Gabapentin 300 MG Oral Cap TAKE ONE CAPSULE BY MOUTH THREE TIMES DAILY   . Levonorgestrel 20 MCG/24HR Intrauterine IUD as directed   . Nitrofurantoin Macrocrystal 50 MG Oral Cap 1 CAPSULE with intercourse for postcoital prophalaxis   . Sertraline HCl 100 MG Oral Tab TAKE 1 TABLET BY MOUTH DAILY   . SUMAtriptan Succinate (IMITREX) 50 MG Oral Tab 1 tablet at headache onset, repeat every 2 hrs. if needed, max 200mg  per 24 hrs.     OBJECTIVE:  BP 133/87  Pulse 74  Resp 22  Wt 369 lb (167.377 kg)  GENERAL:obese, well nourished patient in no acute distress.  Alert and oriented x3.    MSK:  + DeQuervain   PSYCHIATRIC     *Orientation: oriented to person, place, time   *Memory: grossly normal   *Mood/affect: full range of affect   *Thought process: intact   *Grooming: normal   *Eye Contact: normal   *Speech: fluent   *Hygiene: normal     URINALYSIS Latest Ref  Rng 10/10/2011   Color  DARK YELLOW   Clarity  HAZY   Indir Bili NEG-  NEG   Ketones NEG- mg/dL NEG   UPGSPG 1.610-9.604  1.025   UOCULT NEG-  TRACE (A)   pH 5.0-8.0  6.5   Protein NEG-TRACE- mg/dL NEG   Urobilin NORMAL- E.U./dL NORMAL   Nitrite NEG-  NEG   Leukocytes NEG-  TRACE (A)   URBC NEG(0-2)- /HPF      E958.9 Suicide attempt  (primary encounter diagnosis)  Comment: multiple life stressors and disinhibition due to ETOH use.   Plan: avoid ETOH     V45.86 Bariatric surgery status    Plan: VITAMIN B12 INJECTION, Prenatal Vit-Fe         Fumarate-FA (PRENATAL FORMULA) 27-1 MG Oral Tab      599.0 UTI (lower urinary tract infection)  Comment: will treat due to symptoms and history, after treatment resume Macrobid after intercourse.   Plan: URINE C&S, Sulfamethoxazole-Trimethoprim         (BACTRIM DS) 800-160 MG Oral Tab      729.5 Hand pain, left  Comment: History of DeQuervain Tendosynovitis  Plan: REFERRAL TO ORTHOPEDICS

## 2011-10-10 NOTE — Progress Notes (Signed)
Why is patient here today? Patient is here to follow up regarding left hand, patient states it still is painful. Patient also complains of urine frequency and urgency x 2 day.     Any new significant medical diagnoses since last visit? NO    Refills? NO    Referral? NO    Letter or form? NO    Lab results? NO    Health maintenance issues? NO        Patient/Parent has reviewed the appropriate VIS and has all questions answered? YES    Vaccine given today without initial adverse effect. Dorise Hiss MA

## 2011-10-11 ENCOUNTER — Telehealth (INDEPENDENT_AMBULATORY_CARE_PROVIDER_SITE_OTHER): Payer: Self-pay | Admitting: Medical

## 2011-10-11 NOTE — Telephone Encounter (Signed)
Pt was seen yesterday for UTI Pt is still experiencing symptoms. Urgency frequency of urination. Pt stated she has taken 3 dosages of Abx. I advised Pt that it has not been a full 24 hrs seen being seen and Abx might not have had a chance to work. Pt then hung up the phone. Do you want to change Abx. Please davise

## 2011-10-11 NOTE — Telephone Encounter (Signed)
CONFIRMED PHONE NUMBER:   Telephone Information:   Home Phone [phone]   Work Phone [phone]   Mobile [phone]     CALLERS FIRST AND LAST NAME: Pershing Proud  FACILITY NAME: na TITLE: na  CALLERS RELATIONSHIP:Self  RETURN CALL: Detailed message on voicemail only    SUBJECT: General Message   REASON FOR REQUEST: patient was in on 10/10/11 for a UTI and with medication prescribed, patient is still experiencing UTI symptoms and woudl like a nurse to please call her to discuss    MESSAGE: Please see the above message, thank you

## 2011-10-12 LAB — URINE C/S: Colony Count: >100000

## 2011-10-13 MED ORDER — FLUCONAZOLE 150 MG OR TABS
ORAL_TABLET | ORAL | Status: AC
Start: 2011-10-13 — End: 2011-10-14

## 2011-10-13 NOTE — Telephone Encounter (Signed)
Per pt UTI Sx is resolving.  But now has yeast infection with Abx.  Would like Rx send to [facility] pharmacy at [facility], pt will be   there at 2pm today.    To provider to relay above.

## 2011-10-13 NOTE — Telephone Encounter (Signed)
Prescription signed and pended, please send to the pharmacy she is requesting.   Thank you

## 2011-10-13 NOTE — Telephone Encounter (Signed)
Rx sent to [facility] outpt pharm at [facility].  Left detailed message with pt.

## 2011-10-13 NOTE — Telephone Encounter (Signed)
The culture is sensitive to the medicationshe is on. Please inquire about symptoms as of today

## 2011-10-16 ENCOUNTER — Ambulatory Visit (HOSPITAL_BASED_OUTPATIENT_CLINIC_OR_DEPARTMENT_OTHER): Payer: Medicare Other | Attending: Plastic Surgery | Admitting: Plastic Surgery

## 2011-10-16 ENCOUNTER — Ambulatory Visit (HOSPITAL_BASED_OUTPATIENT_CLINIC_OR_DEPARTMENT_OTHER): Payer: Medicare Other | Attending: Plastic Surgery | Admitting: Rehabilitative and Restorative Service Providers"

## 2011-10-16 ENCOUNTER — Other Ambulatory Visit (HOSPITAL_BASED_OUTPATIENT_CLINIC_OR_DEPARTMENT_OTHER): Payer: Self-pay | Admitting: Plastic Surgery

## 2011-10-16 DIAGNOSIS — M25539 Pain in unspecified wrist: Secondary | ICD-10-CM

## 2011-10-16 DIAGNOSIS — G8929 Other chronic pain: Secondary | ICD-10-CM | POA: Insufficient documentation

## 2011-10-16 DIAGNOSIS — M25549 Pain in joints of unspecified hand: Secondary | ICD-10-CM | POA: Insufficient documentation

## 2011-10-16 DIAGNOSIS — IMO0001 Reserved for inherently not codable concepts without codable children: Secondary | ICD-10-CM | POA: Insufficient documentation

## 2011-10-16 DIAGNOSIS — R937 Abnormal findings on diagnostic imaging of other parts of musculoskeletal system: Secondary | ICD-10-CM | POA: Insufficient documentation

## 2011-10-16 DIAGNOSIS — M79609 Pain in unspecified limb: Secondary | ICD-10-CM | POA: Insufficient documentation

## 2011-10-16 LAB — PR X-RAY WRIST 3+ VW LEFT

## 2011-10-29 ENCOUNTER — Encounter (HOSPITAL_BASED_OUTPATIENT_CLINIC_OR_DEPARTMENT_OTHER): Payer: Medicare Other | Admitting: Family

## 2011-11-01 ENCOUNTER — Encounter (INDEPENDENT_AMBULATORY_CARE_PROVIDER_SITE_OTHER): Payer: Self-pay | Admitting: Family Medicine

## 2011-11-01 ENCOUNTER — Ambulatory Visit (INDEPENDENT_AMBULATORY_CARE_PROVIDER_SITE_OTHER): Payer: Medicare Other | Admitting: Family Medicine

## 2011-11-01 VITALS — BP 131/87 | HR 80 | Temp 97.6°F | Resp 16 | Wt 375.0 lb

## 2011-11-01 DIAGNOSIS — IMO0001 Reserved for inherently not codable concepts without codable children: Secondary | ICD-10-CM

## 2011-11-01 DIAGNOSIS — E539 Vitamin B deficiency, unspecified: Secondary | ICD-10-CM

## 2011-11-01 DIAGNOSIS — M791 Myalgia, unspecified site: Secondary | ICD-10-CM

## 2011-11-01 DIAGNOSIS — L309 Dermatitis, unspecified: Secondary | ICD-10-CM

## 2011-11-01 DIAGNOSIS — L259 Unspecified contact dermatitis, unspecified cause: Secondary | ICD-10-CM

## 2011-11-01 MED ORDER — CYCLOBENZAPRINE HCL 5 MG OR TABS
ORAL_TABLET | ORAL | Status: DC
Start: 2011-11-01 — End: 2012-03-16

## 2011-11-01 MED ORDER — IBUPROFEN 800 MG OR TABS
ORAL_TABLET | ORAL | Status: DC
Start: 2011-11-01 — End: 2012-01-21

## 2011-11-01 MED ORDER — CLOTRIMAZOLE 1 % EX CREA
TOPICAL_CREAM | CUTANEOUS | Status: DC
Start: 2011-11-01 — End: 2012-03-16

## 2011-11-01 NOTE — Progress Notes (Signed)
Why is patient here today? Pt is here and c/o sore muscle. 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     Any new significant medical diagnoses since last visit? NO    Refills? NO    Referral? YES    Letter or form? NO    Lab results? NO    Health maintenance issues? NO

## 2011-11-01 NOTE — Progress Notes (Signed)
2:42 PM    Sharon Stephens is a 36 year old year old female who presents today for   Whole body achiness. 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 Muscles sore like she is just not used to the physical labor.  Not pinpoint tender.   Took some ibuprofen for her pain. She also tried naproxen.   Pain is affecting her sleep.     She has a rash around belly button as well. Has had fungal infection there before. Feels burning and irritaiton.     Was 547 lbs before weight loss surgery.     ROS: urine is yellow - no coke colored urine    History   Smoking status   . Former Smoker   Smokeless tobacco   . Not on file       Patient Active Problem List   Diagnosis   . BENIGN HYPERTENSION   . MIGRAINE NOS INTRACTABLE   . MORBID OBESITY   . CHEST PAIN NOS   . NONSPEC ABNL PAP SMEAR CERVIX, UNSPEC   . Bariatric surgery status   . IUD (intrauterine device) in place   . Suicide attempt   . Homeless     Current outpatient prescriptions:Gabapentin 300 MG Oral Cap, TAKE ONE CAPSULE BY MOUTH THREE TIMES DAILY, Disp: 270 Cap, Rfl: 3;  Levonorgestrel 20 MCG/24HR Intrauterine IUD, as directed, Disp: , Rfl: ;  Nitrofurantoin Macrocrystal 50 MG Oral Cap, 1 CAPSULE with intercourse for postcoital prophalaxis, Disp: 30 Cap, Rfl: 2;  Nitrofurantoin Macrocrystal 50 MG Oral Cap, 1 CAPSULE with intercourse for postcoital prophalaxis, Disp: 30 Cap, Rfl: 2  Prenatal Vit-Fe Fumarate-FA (PRENATAL FORMULA) 27-1 MG Oral Tab, 1 tab daily, Disp: 100 Tab, Rfl: 3;  Sertraline HCl 100 MG Oral Tab, TAKE 1 TABLET BY MOUTH DAILY, Disp: 90 Tab, Rfl: 0;  SUMAtriptan Succinate (IMITREX) 50 MG Oral Tab, 1 tablet at headache onset, repeat every 2 hrs. if needed, max 200mg  per 24 hrs., Disp: 18 Tab, Rfl: 0    OBJECTIVE:  BP 131/87  Pulse 80  Temp 97.6 F (36.4 C) (Temporal)  Resp 16  Wt 375 lb (170.099 kg)  wnwd, nad  normal affect  Some bruises on abd, no remarkable muscle tenderness    Skin: erythema around belly button, no  satellite lesions    A/P:     729.1 Muscle soreness  (primary encounter diagnosis)  Comment: ok to use nsaids, tylneol, dont mix 2 kinds of naids, flexeril ok esp for night, but cannot take and drive, likely drowsy on it, all explained to patient   Plan: Cyclobenzaprine HCl 5 MG Oral Tab  ibu 800 mg ordered - precautions on hydration, risk for kidneys reviewed         i advised her i would not recommend narcotics  Congratulated her on working. 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 Type of work will vary.     Ok to get b12 shot today    Umbilical lesion - gave prescription she found helpful before.

## 2011-11-01 NOTE — Patient Instructions (Signed)
It was a pleasure to see you in clinic today. Your Medical Assistant was: Sara H.           You can schedule an appointment to see us by calling  [phone] or via eCare.     If labs were ordered today the results are expected to be available via eCare 5 days later. Otherwise, result letters are mailed 7-10 days after your tests are completed. If your physician needs to change your care based on your results, you will receive a phone call to notify you. If you haven't heard from him/her and it has been more than 10 days please give us a call.     Thank you for choosing [facility].

## 2011-11-03 ENCOUNTER — Encounter (INDEPENDENT_AMBULATORY_CARE_PROVIDER_SITE_OTHER): Payer: Self-pay | Admitting: Family Medicine

## 2011-11-10 ENCOUNTER — Ambulatory Visit (INDEPENDENT_AMBULATORY_CARE_PROVIDER_SITE_OTHER): Payer: Medicare Other | Admitting: Medical

## 2011-11-19 ENCOUNTER — Other Ambulatory Visit: Payer: Self-pay | Admitting: Medical

## 2011-11-19 NOTE — Telephone Encounter (Signed)
CONFIRMED PHONE NUMBER:   Mobile [phone]     CALLERS FIRST AND LAST NAME: Pershing Proud    FACILITY NAME: na TITLE: na  CALLERS RELATIONSHIP:Self  RETURN CALL: Detailed message on voicemail only    SUBJECT: Prescription Management   REASON FOR REQUEST: patient needs a new prescription for zyrtec for her allergies and a refill of sertraline/zoloft    MEDICATION: zyrtec for allergies and zoloft refill  CONCERN/QUESTION: new prescription request  PRESCRIBING PROVIDER: Purnell Shoemaker    DOSE TAKING NOW: 100mg   PHARMACY NAME, LOCATION, & PHONE #: [facility] in [place]

## 2011-11-20 MED ORDER — CETIRIZINE HCL 10 MG OR TABS
ORAL_TABLET | ORAL | Status: DC
Start: 2011-11-20 — End: 2012-03-16

## 2011-11-20 MED ORDER — SERTRALINE HCL 100 MG OR TABS
ORAL_TABLET | ORAL | Status: DC
Start: 2011-11-19 — End: 2012-03-16

## 2011-11-20 NOTE — Telephone Encounter (Signed)
New prescription request for allergies so will defer to provider.

## 2011-11-20 NOTE — Telephone Encounter (Signed)
Done

## 2011-11-24 ENCOUNTER — Other Ambulatory Visit (INDEPENDENT_AMBULATORY_CARE_PROVIDER_SITE_OTHER): Payer: Self-pay | Admitting: Medical

## 2011-11-24 ENCOUNTER — Other Ambulatory Visit (HOSPITAL_BASED_OUTPATIENT_CLINIC_OR_DEPARTMENT_OTHER): Payer: Self-pay | Admitting: Registered Nurse

## 2011-11-24 ENCOUNTER — Ambulatory Visit (HOSPITAL_BASED_OUTPATIENT_CLINIC_OR_DEPARTMENT_OTHER): Payer: Medicare Other | Admitting: Registered"

## 2011-11-24 ENCOUNTER — Ambulatory Visit: Payer: Medicare Other | Attending: Surgery | Admitting: Surgery

## 2011-11-24 DIAGNOSIS — R202 Paresthesia of skin: Secondary | ICD-10-CM

## 2011-11-24 DIAGNOSIS — D649 Anemia, unspecified: Secondary | ICD-10-CM

## 2011-11-24 DIAGNOSIS — Z9884 Bariatric surgery status: Secondary | ICD-10-CM | POA: Insufficient documentation

## 2011-11-24 LAB — IRON BINDING CAPACITY (W/IRON, TRANSFERRIN & TRANSF SAT)
Iron, SRM: 20 ug/dL — ABNORMAL LOW (ref 40–155)
Total Iron Binding Capacity: 311 ug/dL (ref 270–535)
Transferrin Saturation: 6 % — ABNORMAL LOW (ref 10–45)
Transferrin: 222 mg/dL (ref 192–382)

## 2011-11-24 LAB — CBC (HEMOGRAM)
Hematocrit: 39 % (ref 36–45)
Hematocrit: 39 % (ref 36–45)
Hemoglobin: 13.4 g/dL (ref 11.5–15.5)
Hemoglobin: 13.4 g/dL (ref 11.5–15.5)
MCH: 28.9 pg (ref 27.3–33.6)
MCH: 28.9 pg (ref 27.3–33.6)
MCHC: 34 g/dL (ref 32.2–36.5)
MCHC: 34 g/dL (ref 32.2–36.5)
MCV: 85 fL (ref 81–98)
MCV: 85 fL (ref 81–98)
Platelet Count: 198 10*3/uL (ref 150–400)
Platelet Count: 198 10*3/uL (ref 150–400)
RBC: 4.64 mil/uL (ref 3.80–5.00)
RBC: 4.64 mil/uL (ref 3.80–5.00)
RDW-CV: 13.7 % (ref 11.6–14.4)
RDW-CV: 13.7 % (ref 11.6–14.4)
WBC: 5.62 10*3/uL (ref 4.3–10.0)
WBC: 5.62 10*3/uL (ref 4.3–10.0)

## 2011-11-24 LAB — COMPREHENSIVE METABOLIC PANEL
ALT (GPT): 13 U/L (ref 6–40)
AST (GOT): 18 U/L (ref 15–40)
Albumin: 3.6 g/dL (ref 3.5–5.2)
Alkaline Phosphatase (Total): 63 U/L (ref 25–100)
Anion Gap: 9 (ref 3–11)
Bilirubin (Total): 0.6 mg/dL (ref 0.2–1.3)
Calcium: 9.1 mg/dL (ref 8.9–10.2)
Carbon Dioxide, Total: 26 mEq/L (ref 22–32)
Chloride: 106 mEq/L (ref 98–108)
Creatinine: 0.63 mg/dL (ref 0.38–1.02)
GFR, Calc, African American: >60 mL/min (ref >59)
GFR, Calc, European American: >60 mL/min (ref >59)
Glucose: 103 mg/dL (ref 62–125)
Potassium: 3.5 mEq/L — ABNORMAL LOW (ref 3.7–5.2)
Protein (Total): 6.6 g/dL (ref 6.0–8.2)
Sodium: 141 mEq/L (ref 136–145)
Urea Nitrogen: 10 mg/dL (ref 8–21)

## 2011-11-24 LAB — GLUCOSE, FASTING: Glucose, Fasting: 101 mg/dL (ref 62–125)

## 2011-11-25 ENCOUNTER — Telehealth (INDEPENDENT_AMBULATORY_CARE_PROVIDER_SITE_OTHER): Payer: Self-pay | Admitting: Medical

## 2011-11-25 DIAGNOSIS — N390 Urinary tract infection, site not specified: Secondary | ICD-10-CM

## 2011-11-25 NOTE — Telephone Encounter (Signed)
She needs to leave a UA so we can make sure she is taking the correct antibiotic.

## 2011-11-25 NOTE — Telephone Encounter (Signed)
CONFIRMED PHONE NUMBER: [phone]  CALLERS FIRST AND LAST NAME: Pershing Proud  FACILITY NAME: NA TITLE: NA  CALLERS RELATIONSHIP:Self  RETURN CALL: Detailed message on voicemail only    SUBJECT: General Message   REASON FOR REQUEST:     MESSAGE: Patient has been getting UTI's quite often lately and is wanting to know if the medication can just be called in for her instead of her having an OV. Please contact if this is an issue, if not please call into [facility] in [place]. Thank you

## 2011-11-25 NOTE — Telephone Encounter (Signed)
Informed pt of provider's message below.  States she will stop by tomorrow to leave UA.  Lab orders pended.    To Neysa to sign pls.  Thanks.

## 2011-11-26 LAB — VITAMIN D (25 HYDROXY)
Vit D (25_Hydroxy) Total: 20 ng/mL — ABNORMAL LOW (ref 20.1–50.0)
Vitamin D2 (25_Hydroxy): 4.9 ng/mL
Vitamin D3 (25_Hydroxy): 15.1 ng/mL

## 2011-11-26 NOTE — Telephone Encounter (Signed)
Signed.

## 2011-12-04 ENCOUNTER — Encounter (HOSPITAL_BASED_OUTPATIENT_CLINIC_OR_DEPARTMENT_OTHER): Payer: Medicare Other | Admitting: Plastic Surgery

## 2011-12-09 ENCOUNTER — Ambulatory Visit (INDEPENDENT_AMBULATORY_CARE_PROVIDER_SITE_OTHER): Payer: Medicare Other | Admitting: Medical

## 2011-12-09 NOTE — Progress Notes (Signed)
This patient failed a scheduled appointment today.  Disposition: no show letter sent

## 2011-12-18 ENCOUNTER — Encounter (INDEPENDENT_AMBULATORY_CARE_PROVIDER_SITE_OTHER): Payer: Self-pay | Admitting: Medical

## 2011-12-18 DIAGNOSIS — N390 Urinary tract infection, site not specified: Secondary | ICD-10-CM | POA: Insufficient documentation

## 2011-12-30 ENCOUNTER — Ambulatory Visit (INDEPENDENT_AMBULATORY_CARE_PROVIDER_SITE_OTHER): Payer: Medicare Other | Admitting: Medical

## 2011-12-30 NOTE — Progress Notes (Signed)
This patient failed a scheduled appointment today.  Disposition: no show letter sent

## 2012-01-07 ENCOUNTER — Ambulatory Visit (INDEPENDENT_AMBULATORY_CARE_PROVIDER_SITE_OTHER): Payer: Medicare Other | Admitting: Obstetrics & Gynecology

## 2012-01-07 VITALS — BP 110/70 | HR 76 | Temp 97.3°F | Resp 20 | Wt 343.5 lb

## 2012-01-07 DIAGNOSIS — L259 Unspecified contact dermatitis, unspecified cause: Secondary | ICD-10-CM

## 2012-01-07 DIAGNOSIS — L309 Dermatitis, unspecified: Secondary | ICD-10-CM

## 2012-01-07 DIAGNOSIS — K909 Intestinal malabsorption, unspecified: Secondary | ICD-10-CM

## 2012-01-07 MED ORDER — TRIAMCINOLONE ACETONIDE 0.1 % EX CREA
TOPICAL_CREAM | CUTANEOUS | Status: DC
Start: 2012-01-07 — End: 2012-03-16

## 2012-01-07 MED ORDER — VITAMIN D (ERGOCALCIFEROL) 1.25 MG (50000 UT) OR CAPS
ORAL_CAPSULE | ORAL | Status: DC
Start: 2012-01-07 — End: 2013-01-31

## 2012-01-07 NOTE — Progress Notes (Signed)
Why is patient here today? Patient is here for multiple concerns:   1) Patient is here for a rash on her face. She notes that it's due to a lotion she got from [place] and [employer] works. Burning. No itching.   2) Would like her vitamin B12 shot today.   3) She notes that on Father's Day she got her finger on a chicken wing. Notes that she went to the ER and they took off half of her nail. Concerned that it will grow back weird.     Any new significant medical diagnoses since last visit? NO    Refills? NO    Referral? NO    Letter or form? NO    Lab results? NO    Health maintenance issues? NO

## 2012-01-07 NOTE — Progress Notes (Signed)
Sharon Stephens is a 36 year old female who presents alone for evaluation of skin lesion.    Location of lesions: face  First noticed by patient: about one week ago after using a new face cream she bought  Progression over time: more raised   Itchy, scaly, "dry".  Also here "due for vit B12 shot" which she's supposed to get monthly b/c of gastric bypass surgery. Also issues with vit D.    Personal history of skin cancer, or precancerous lesion? NO  Family history of skin cancer? NO  + H/o eczema    Pertinent ROS:   No dark or ulcerating lesions noted. No f/c, spreading redness.    OBJECTIVE:  Pleasant, obese female in NAD  VS: see above   SKIN: of face reveals patchy, slightly erythematous lesions with soft borders, some confluence, perioral, periorbital and on cheeks, mentum.    ASSESSMENT/PLAN/ EDUCATION    Eczema  Vit B 12 refill    Trial of triamcinolone cream prn.  Labs from May reviewed with pt: minimally low potassium; will e-mail PCP to follow up with pt in regards to possible treatment (if needed).

## 2012-01-11 ENCOUNTER — Other Ambulatory Visit (EMERGENCY_DEPARTMENT_HOSPITAL): Payer: Self-pay

## 2012-01-11 ENCOUNTER — Emergency Department
Admission: EM | Admit: 2012-01-11 | Discharge: 2012-01-11 | Disposition: A | Discharge status: Home / Self Care | Payer: Medicare Other | Attending: Emergency Medicine | Admitting: Emergency Medicine

## 2012-01-11 DIAGNOSIS — IMO0002 Reserved for concepts with insufficient information to code with codable children: Secondary | ICD-10-CM

## 2012-01-11 DIAGNOSIS — E876 Hypokalemia: Secondary | ICD-10-CM

## 2012-01-11 DIAGNOSIS — G43909 Migraine, unspecified, not intractable, without status migrainosus: Secondary | ICD-10-CM

## 2012-01-11 LAB — CBC (HEMOGRAM)
Hematocrit: 37 % (ref 36–45)
Hemoglobin: 12.1 g/dL (ref 11.5–15.5)
MCH: 28.3 pg (ref 27.3–33.6)
MCHC: 32.6 g/dL (ref 32.2–36.5)
MCV: 87 fL (ref 81–98)
Platelet Count: 204 10*3/uL (ref 150–400)
RBC: 4.27 mil/uL (ref 3.80–5.00)
RDW-CV: 13.6 % (ref 11.6–14.4)
WBC: 8.93 10*3/uL (ref 4.3–10.0)

## 2012-01-11 LAB — COMPREHENSIVE METABOLIC PANEL
ALT (GPT): 12 U/L (ref 6–40)
AST (GOT): 20 U/L (ref 15–40)
Albumin: 3.4 g/dL — ABNORMAL LOW (ref 3.5–5.2)
Alkaline Phosphatase (Total): 50 U/L (ref 25–100)
Anion Gap: 8 (ref 3–11)
Bilirubin (Total): 0.6 mg/dL (ref 0.2–1.3)
Calcium: 8.3 mg/dL — ABNORMAL LOW (ref 8.9–10.2)
Carbon Dioxide, Total: 24 mEq/L (ref 22–32)
Chloride: 105 mEq/L (ref 98–108)
Creatinine: 0.52 mg/dL (ref 0.38–1.02)
GFR, Calc, African American: >60 mL/min (ref >59)
GFR, Calc, European American: >60 mL/min (ref >59)
Glucose: 89 mg/dL (ref 62–125)
Potassium: 3.3 mEq/L — ABNORMAL LOW (ref 3.7–5.2)
Protein (Total): 5.6 g/dL — ABNORMAL LOW (ref 6.0–8.2)
Sodium: 137 mEq/L (ref 136–145)
Urea Nitrogen: 14 mg/dL (ref 8–21)

## 2012-01-13 ENCOUNTER — Encounter (INDEPENDENT_AMBULATORY_CARE_PROVIDER_SITE_OTHER): Payer: Self-pay | Admitting: Medical

## 2012-01-13 ENCOUNTER — Other Ambulatory Visit (INDEPENDENT_AMBULATORY_CARE_PROVIDER_SITE_OTHER): Payer: Self-pay | Admitting: Medical

## 2012-01-13 ENCOUNTER — Telehealth (INDEPENDENT_AMBULATORY_CARE_PROVIDER_SITE_OTHER): Payer: Self-pay | Admitting: Family Medicine

## 2012-01-13 DIAGNOSIS — E876 Hypokalemia: Secondary | ICD-10-CM

## 2012-01-13 DIAGNOSIS — D649 Anemia, unspecified: Secondary | ICD-10-CM

## 2012-01-13 MED ORDER — FERROUS SULFATE 325 (65 FE) MG OR TABS
ORAL_TABLET | ORAL | Status: DC
Start: 2012-01-13 — End: 2012-12-15

## 2012-01-13 MED ORDER — POTASSIUM CHLORIDE ER 10 MEQ OR TBCR
EXTENDED_RELEASE_TABLET | ORAL | Status: DC
Start: 2012-01-13 — End: 2012-01-24

## 2012-01-13 NOTE — Telephone Encounter (Signed)
Patient informed. 

## 2012-01-13 NOTE — Telephone Encounter (Signed)
Patient is scheduled to see Dr Vickey Sages tomorrow. Please inform patient that I have moved her appointment from 10:45am to 11:00am to allow adequate time. Thank you.

## 2012-01-14 ENCOUNTER — Ambulatory Visit (INDEPENDENT_AMBULATORY_CARE_PROVIDER_SITE_OTHER): Payer: Medicare Other | Admitting: Family Medicine

## 2012-01-14 VITALS — BP 130/92 | HR 80 | Temp 97.6°F | Resp 18 | Wt 363.5 lb

## 2012-01-14 DIAGNOSIS — L03119 Cellulitis of unspecified part of limb: Secondary | ICD-10-CM

## 2012-01-14 DIAGNOSIS — L03115 Cellulitis of right lower limb: Secondary | ICD-10-CM

## 2012-01-14 DIAGNOSIS — T1491XA Suicide attempt, initial encounter: Secondary | ICD-10-CM

## 2012-01-14 DIAGNOSIS — X838XXA Intentional self-harm by other specified means, initial encounter: Secondary | ICD-10-CM

## 2012-01-14 MED ORDER — CEPHALEXIN 250 MG OR CAPS
ORAL_CAPSULE | ORAL | Status: AC
Start: 2012-01-14 — End: 2012-01-24

## 2012-01-14 NOTE — Patient Instructions (Addendum)
Please call [facility] psychiatry number re: your referral.  Please try to go to the [facility] walk-in clinic.    Take the keflex as prescribed.    Follow up 1 week.    [facility] (Crisis Clinic) [phone]  24 hour Crisis Line [phone]    *These providers see children    [facility] Crisis Lines  [facility] [phone]  *[place]ni [place], [place]  and [place]  [phone]  *[place] [phone]    [facility]: [phone]  Hours: 8-10, 1-2:30 Monday thru Friday.  First come, first served

## 2012-01-14 NOTE — Progress Notes (Signed)
Why is patient here today? Patient is here for a sore on her right foot since last week.   No fevers.     Also notes that her Sertraline is making her nauseated. Wonders is something different can be prescribed.     Any new significant medical diagnoses since last visit? NO    Refills? NO    Referral? NO    Letter or form? NO    Lab results? NO    Health maintenance issues? NO

## 2012-01-14 NOTE — Progress Notes (Signed)
CC: blister on right foot, low potassium, and anemia.  S:     1.  Blister:  5 days ago, patient noted a blister on right sole of foot.  Has not popped it.  It seems to be worsening.    No fevers, chills    She is living on the streets.  At [facility], the nurse there made a bandage for this, but it made it worse.  She went to the ED for this 2 days ago, no intervention.  She has taken keflex before, no allergies    2. Low potassium on recent blood draw, reviewed all lab results with patient.  Patient just picked up potassium supplement.  Did have muscle spasms the other night.    3. Patient is taking iron for low iron level.  She follows up at the bariatric surgery clinic. hgb was actually normal and MCV was normal.    4.  Depression:  Not taking her meds regularly, has upset her stomach.  She is feeling increased depressed mood.  Denies SUICIDAL IDEATION.  Not stockpiling pills.  Has not been using alcohol. She is supposed to be on sertraline.  She has tried taking 1/2 dose, taking 1/2 tab bid, taking with food.  She was referred to [facility] through [facility], but she didn't qualify for their programs.    Past Medical History   Diagnosis Date   . Depressive disorder, not elsewhere classified    . Unspecified sleep apnea    . Hernia of other specified sites of abdominal cavity without mention of obstruction or gangrene      notes having 3 in abdomen    . Allergic rhinitis, cause unspecified    . Chronic obstructive asthma, unspecified    . Type II or unspecified type diabetes mellitus without mention of complication, not stated as uncontrolled      broaderline    . Esophageal reflux    . Essential hypertension, benign      ROS:  As per hPI    O: BP 130/92  Pulse 80  Temp 97.6 F (36.4 C) (Temporal)  Resp 18  Wt 363 lb 8 oz (164.883 kg)  Gen: no acute distress, morbidly obese  Musculoskeletal:  On the right sole of the foot, at the base of the great toe, there is a 1.5 cm blister with mild surrounding erythema.   Cleansed with betadyne. Lanced with a 18 gauge needle, clear fluid, culture taken.  Patient declines dressing.  PSYCHIATRIC:  Judgement/insight:  fair, Mood/affect:  Normal., Orientation:  Normal., Recent/remote memory:  Deferred.      A/P:  682.7 Cellulitis of right foot  (primary encounter diagnosis)  Comment: patient declines dressing.  Keflex x 1 week.  Follow up 1 week, sooner if worsening.  Plan: Cephalexin (KEFLEX) 250 MG Oral Cap,         CULTURE:BACT - WOUND (AEROBIC)            E958.9 Suicide attempt  Comment: patient currently denies SUICIDAL IDEATION, but notes her depression symptoms are worsening.  She has a complex psychiatric history.  Referred back to [facility] and also encouraged her to go to the [facility] walk in clinic.  Plan: REFERRAL TO PSYCHIATRY

## 2012-01-17 ENCOUNTER — Emergency Department (HOSPITAL_BASED_OUTPATIENT_CLINIC_OR_DEPARTMENT_OTHER)
Admission: EM | Admit: 2012-01-17 | Discharge: 2012-01-17 | Disposition: A | Discharge status: Home / Self Care | Payer: Medicare Other | Attending: Nurse Practitioner | Admitting: Nurse Practitioner

## 2012-01-17 DIAGNOSIS — Z3202 Encounter for pregnancy test, result negative: Secondary | ICD-10-CM | POA: Insufficient documentation

## 2012-01-17 DIAGNOSIS — F329 Major depressive disorder, single episode, unspecified: Secondary | ICD-10-CM

## 2012-01-17 LAB — WOUND C/S W/GRAM
Culture: NO GROWTH
Gram Smear: NONE SEEN

## 2012-01-21 ENCOUNTER — Telehealth (INDEPENDENT_AMBULATORY_CARE_PROVIDER_SITE_OTHER): Payer: Self-pay | Admitting: Medical

## 2012-01-21 ENCOUNTER — Ambulatory Visit (INDEPENDENT_AMBULATORY_CARE_PROVIDER_SITE_OTHER): Payer: Medicare Other | Admitting: Medical

## 2012-01-21 ENCOUNTER — Encounter (INDEPENDENT_AMBULATORY_CARE_PROVIDER_SITE_OTHER): Payer: Self-pay | Admitting: Medical

## 2012-01-21 VITALS — BP 150/64 | HR 102 | Temp 97.6°F | Resp 22

## 2012-01-21 DIAGNOSIS — M79609 Pain in unspecified limb: Secondary | ICD-10-CM

## 2012-01-21 DIAGNOSIS — M79671 Pain in right foot: Secondary | ICD-10-CM

## 2012-01-21 DIAGNOSIS — F3289 Other specified depressive episodes: Secondary | ICD-10-CM

## 2012-01-21 DIAGNOSIS — F32A Depression, unspecified: Secondary | ICD-10-CM

## 2012-01-21 DIAGNOSIS — E876 Hypokalemia: Secondary | ICD-10-CM

## 2012-01-21 LAB — POTASSIUM, SERUM: Potassium: 3.2 mEq/L — ABNORMAL LOW (ref 3.7–5.2)

## 2012-01-21 MED ORDER — IBUPROFEN 800 MG OR TABS
ORAL_TABLET | ORAL | Status: DC
Start: 2012-01-21 — End: 2012-03-16

## 2012-01-21 NOTE — Telephone Encounter (Signed)
Hi [name],    It looks like Dr. Vickey Sages referred tohis patient to [facility] psych and this response was sent:    Sharon Stephens,   This patient is referred by Willy Eddy from [facility] for a psych appt.   Please follow up with this request and schedule with you or Dr. Ellamae Sia.   If the patient is needing case management please have your [facility] refer her to [facility] Intake [facility] Brief intervention services (where she was being followed previously).   Thanks,   Leonie Man      Are we referring her for services there or is Dr. Ellamae Sia going to be seeing her>    Patient's number is [phone]

## 2012-01-21 NOTE — Progress Notes (Signed)
2:28 PM  Sharon Stephens  is a 36 year old female  with the following concerns:    Elevated BP- just had a Energy Drink     Follow up on cellulitis  on foot. No redness, warmth or rash but has pain at base of right big toe where it was drained.     Would like to restart lexapro. She is not suicidal at this time.     She it taking her potassium supplement     OBJECTIVE:  BP 150/64  Pulse 102  Temp 97.6 F (36.4 C) (Temporal)  Resp 22  GENERAL: obese, well nourished patient in no acute distress.  Alert and oriented x3.    SKIN:  No rashes or lesions noted on foot   Endorses tenderness on the plantar portion of the base of right great toe   PSYCH:  Normal mood an affect.  Alert and oriented x3.  Speech of normal content, volume, and pace.    729.5 Foot pain, right  (primary encounter diagnosis)  Comment: culture is negative.   No sign of infection. I am not sure why she is having pain. She will return if this fails to resolve.     311 Depression  Comment: [name] has a complicated psychiatric  history and often misses her appointments here I am reluctant to make changes today. She was referred to [facility] psych but after talking to [facility], it sounds like the patient was referred back her to be seen by Dr. Ellamae Sia. She will discuss this case with him and make an appointment for her to be seen       276.8 Hypokalemia    Plan: POTASSIUM, SERUM

## 2012-01-22 ENCOUNTER — Emergency Department
Admission: EM | Admit: 2012-01-22 | Discharge: 2012-01-22 | Payer: Medicare Other | Attending: Emergency Medicine | Admitting: Emergency Medicine

## 2012-01-22 DIAGNOSIS — IMO0002 Reserved for concepts with insufficient information to code with codable children: Secondary | ICD-10-CM | POA: Insufficient documentation

## 2012-01-22 DIAGNOSIS — S93409A Sprain of unspecified ligament of unspecified ankle, initial encounter: Secondary | ICD-10-CM | POA: Insufficient documentation

## 2012-01-22 DIAGNOSIS — W108XXA Fall (on) (from) other stairs and steps, initial encounter: Secondary | ICD-10-CM

## 2012-01-22 DIAGNOSIS — M25519 Pain in unspecified shoulder: Secondary | ICD-10-CM

## 2012-01-22 DIAGNOSIS — M25579 Pain in unspecified ankle and joints of unspecified foot: Secondary | ICD-10-CM | POA: Insufficient documentation

## 2012-01-22 NOTE — Telephone Encounter (Signed)
Referral; received.  Will discuss with Dr. Ellamae Sia, psychiatrist tomorrow when he is here for consultation.  [name].

## 2012-01-24 ENCOUNTER — Encounter (INDEPENDENT_AMBULATORY_CARE_PROVIDER_SITE_OTHER): Payer: Self-pay | Admitting: Medical

## 2012-01-24 ENCOUNTER — Other Ambulatory Visit (INDEPENDENT_AMBULATORY_CARE_PROVIDER_SITE_OTHER): Payer: Self-pay | Admitting: Medical

## 2012-01-24 DIAGNOSIS — E876 Hypokalemia: Secondary | ICD-10-CM | POA: Insufficient documentation

## 2012-01-24 MED ORDER — POTASSIUM CHLORIDE ER 10 MEQ OR TBCR
EXTENDED_RELEASE_TABLET | ORAL | Status: DC
Start: 2012-01-24 — End: 2012-04-26

## 2012-01-24 NOTE — Telephone Encounter (Signed)
Was is the status of this referral?

## 2012-01-27 ENCOUNTER — Encounter (INDEPENDENT_AMBULATORY_CARE_PROVIDER_SITE_OTHER): Payer: Self-pay | Admitting: Medical

## 2012-01-27 NOTE — Telephone Encounter (Addendum)
Please schedule this pt with me [name]). Thanks.  Consulted with Dr. Ellamae Sia.    Zipporah Plants, LICSW

## 2012-01-28 ENCOUNTER — Telehealth (INDEPENDENT_AMBULATORY_CARE_PROVIDER_SITE_OTHER): Payer: Self-pay | Admitting: Medical

## 2012-01-28 NOTE — Telephone Encounter (Signed)
Left message for return call.    CCR: Please transfer to the FD to schedule social work appointment when patient calls back. Thank you!

## 2012-01-28 NOTE — Telephone Encounter (Signed)
CONFIRMED PHONE NUMBER:  Telephone Information:   Home Phone [phone]   Work Phone [phone]   Mobile [phone]       CALLERS FIRST AND LAST NAME: Sharon Stephens    FACILITY NAME: na TITLE: na  CALLERS RELATIONSHIP:Self  RETURN CALL: General message OK    SUBJECT: Appointment Request   REASON FOR REQUEST: Psychiatric evaluation      REQUEST APPOINTMENT WITH: Zipporah Plants  REFERRING PROVIDER:Tamara Atkins  REQUESTED DATE: As soon as possible.   REQUESTED TIME: na  UNABLE TO APPOINT: Other: Clinic schedules behavorial health appointments.

## 2012-01-29 NOTE — Telephone Encounter (Signed)
Scheduled

## 2012-01-30 NOTE — Telephone Encounter (Signed)
noted 

## 2012-02-03 ENCOUNTER — Other Ambulatory Visit (EMERGENCY_DEPARTMENT_HOSPITAL): Payer: Self-pay | Admitting: Emergency Medicine

## 2012-02-03 ENCOUNTER — Emergency Department
Admission: EM | Admit: 2012-02-03 | Discharge: 2012-02-03 | Disposition: A | Discharge status: Home / Self Care | Payer: Medicare Other | Attending: Emergency Medicine | Admitting: Emergency Medicine

## 2012-02-03 DIAGNOSIS — N390 Urinary tract infection, site not specified: Secondary | ICD-10-CM

## 2012-02-03 LAB — URINALYSIS COMPLETE, URN
Bacteria, URN: NONE SEEN
Bilirubin (Qual), URN: NEGATIVE
Epith Cells_Renal/Trans,URN: NEGATIVE /HPF
Glucose Qual, URN: NEGATIVE mg/dL
Leukocyte Esterase, URN: POSITIVE — AB
Nitrite, URN: NEGATIVE
Specific Gravity, URN: 1.023 g/mL (ref 1.002–1.027)
pH, URN: 6 (ref 5.0–8.0)

## 2012-02-03 LAB — PREGNANCY TEST, URINE
Pregnancy Test, URN: NEGATIVE
Specific Gravity, URN: 1.023 g/mL (ref 1.002–1.027)

## 2012-02-05 LAB — URINE C/S: Colony Count: 50000

## 2012-02-11 ENCOUNTER — Ambulatory Visit (INDEPENDENT_AMBULATORY_CARE_PROVIDER_SITE_OTHER): Payer: Medicare Other | Admitting: Social Worker

## 2012-02-11 ENCOUNTER — Telehealth (INDEPENDENT_AMBULATORY_CARE_PROVIDER_SITE_OTHER): Payer: Self-pay | Admitting: Medical

## 2012-02-11 ENCOUNTER — Other Ambulatory Visit (INDEPENDENT_AMBULATORY_CARE_PROVIDER_SITE_OTHER): Payer: Medicare Other

## 2012-02-11 ENCOUNTER — Encounter (INDEPENDENT_AMBULATORY_CARE_PROVIDER_SITE_OTHER): Payer: Medicare Other | Admitting: Optometrist

## 2012-02-11 ENCOUNTER — Ambulatory Visit (INDEPENDENT_AMBULATORY_CARE_PROVIDER_SITE_OTHER): Payer: Medicare Other | Admitting: Family Medicine

## 2012-02-11 ENCOUNTER — Ambulatory Visit (INDEPENDENT_AMBULATORY_CARE_PROVIDER_SITE_OTHER): Payer: Medicare Other

## 2012-02-11 VITALS — BP 130/84 | HR 72 | Temp 97.5°F | Resp 18 | Wt 363.8 lb

## 2012-02-11 DIAGNOSIS — K2970 Gastritis, unspecified, without bleeding: Secondary | ICD-10-CM

## 2012-02-11 DIAGNOSIS — Z658 Other specified problems related to psychosocial circumstances: Secondary | ICD-10-CM

## 2012-02-11 DIAGNOSIS — B3731 Acute candidiasis of vulva and vagina: Secondary | ICD-10-CM

## 2012-02-11 DIAGNOSIS — E876 Hypokalemia: Secondary | ICD-10-CM

## 2012-02-11 DIAGNOSIS — E539 Vitamin B deficiency, unspecified: Secondary | ICD-10-CM

## 2012-02-11 LAB — PR A1C RAPID, ONSITE: Hemoglobin A1C: 5.2 % (ref 4.0–6.0)

## 2012-02-11 LAB — POTASSIUM, SERUM: Potassium: 4.2 mEq/L (ref 3.7–5.2)

## 2012-02-11 MED ORDER — RANITIDINE HCL 150 MG OR CAPS
ORAL_CAPSULE | ORAL | Status: DC
Start: 2012-02-11 — End: 2012-03-16

## 2012-02-11 MED ORDER — FLUCONAZOLE 150 MG OR TABS
ORAL_TABLET | ORAL | Status: AC
Start: 2012-02-11 — End: 2012-02-12

## 2012-02-11 NOTE — Progress Notes (Signed)
Pt is 36 y/o Caucasian referred to social work/psychiatry by Dr. Vickey Sages for medication evaluation.  Pt is homeless, stays at shelter at times or with a friend at other times, also on the streets.  Pt has two children ages 54 y/o daughter and 73 y/o son.  Children live with their father's family per CPS intervention.  PT has visitation with them.  Past medical history include bariatric surgery on 06-07-2010., obesity and depression.  Pt relates involved psychiatric history of depression and anxiety.  Pt was recently hospitalized at [facility] in March of this year and was prescribed Sertraline which pt is not taking because it made her nauseas.  Pt tells me she has made six suicide attempts in past year.  Last time pt took ETOH, Zoloft and an energy drink and was hospitalized.  Pt relates she has overdosed with meds in the past.    Pt indicates that she has been recommended for DBT classes but cannot find DBT program that is open,  Pt relates she is working with CPS and they are requiring pt have a drug and ETOH assessment and DV assessment.    Pt circumstances of CPS involvement as follows.  Pt relates after her weight loss surgery in Dec of 2011, she went to a nursing facility to continue to recover.  PT relates after that she went home with children, children's aunt and their cousin.  Pt relates "I couldn't deal with the kids" and so left to stay with a friend while children remained with aunt and cousin.  Pt relates that her son told the school that he was being beaten so school contacted CPS; pt relates CPS viewed mother as being neglectful.  PT relates living with her friend didn't work out and pt ended up going to jail due to DV charge against this female friend.  More recently pt has been at [facility] which pt relates is a shelter associated with [employer].  She was kicked out of there following some breech of the rules and then became homeless.  Pt relates she is hoping to get back into [facility]. Pt relates the staff know her there and she can get back into services and case management.  PT relates she saw a psychiatrist there; couldn't remember the name.  PT relates she is sentenced to do community service due to her DV charge evidently.    PT had difficulty remembering when depression started.  PT has taken Lexapro and Wellbutrin int eh past 1 1/2 years ago, relates this was the most helpful to her and what she wants now.  PT relayed she has never been in community mental health program.    Pt in a weight loss support group.  PT relates since her surgery she has lost 204 lbs from 547 lbs that she was at when she had surgery.   Pt denies use of ETOh or illicit drugs.  Pt required to do random UA checks at [facility] which pt relates are consistently clean.    Pt denies that she has biological family here.  Family in [place]; however pt does not talk to them.  PT relates children's father in [place]; however he is not involved with the family.    Assessment:  Pt presents as overweight but appropriately groomed female with involved social and mental health history.  Immediate concern is medication evaluation.  PT relates other goals include stable housing, getting her children back and getting into DBT therapy.  Plan: Will consult with Dr. Ellamae Sia.  Friday when he is next here.  Zipporah Plants, LICSW

## 2012-02-11 NOTE — Telephone Encounter (Signed)
Pt needs to call [facility] pharmacy to transfer Rx to [facility].

## 2012-02-11 NOTE — Patient Instructions (Addendum)
Take the medication for the yeast infection.    You should not drink alcohol while taking this.    Take the zantac for your abdominal symptoms.  I think you have some mild inflammation of the upper GI system.  This should help, please follow up if it does not.

## 2012-02-11 NOTE — Progress Notes (Signed)
Patient is a 36 year old female here for the following reasons:    S:  1.  Abdominal pain:  Records reviewed with patient in the room today from ED visit on 02/08/12.  2 days of epigastric pain.    She was on potassium for low potassium.  History of gastric bypass.  CT showed anterior abdominal wall fat containing hernia, 8.5 cm LLQ, another one that is 3.7 cm,  and a 2.1 cm midline infraumbilical hernia 8.2 cm from the umbilicus.  Moderate hepatomegaly.  3 cm left ovarian cyst.  liver function tests were normal.  preg test neg.  They did not discharge her with any medication    Pain still there.Sharon Stephens   No pain with eating.  No nausea, vomiting, diarrhea, constipation.  Pain is epigastric over the the LUQ.    2.  Yeast infection:  She has recently been treated for a UTI.  Finished antibiotic, urine symptoms are better.  White vag discharge and itching.  Currently sexually active with 1 partner.  Has an IUD and tubes tied.    Past Medical History   Diagnosis Date   . Depressive disorder, not elsewhere classified    . Unspecified sleep apnea    . Hernia of other specified sites of abdominal cavity without mention of obstruction or gangrene      notes having 3 in abdomen    . Allergic rhinitis, cause unspecified    . Chronic obstructive asthma, unspecified    . Type II or unspecified type diabetes mellitus without mention of complication, not stated as uncontrolled      broaderline    . Esophageal reflux    . Essential hypertension, benign        ROS:  Constit: neg for fevers, chills  GI: neg for heartburn.  GU: as per HPI.  No vaginal spotting or bleeding.  Psych:  Patient has appointment with Zipporah Plants today for her depression    O:   BP 130/84  Pulse 72  Temp 97.5 F (36.4 C) (Temporal)  Resp 18  Wt 363 lb 12.8 oz (165.019 kg)  Gen: no acute distress, morbidly obese  Abd: soft, mild tenderness to palpation over the epigastrium and LUQ.  No guarding or rebound.    A/P:  535.50 Gastritis  (primary encounter  diagnosis)  Comment: trial of ranitidine.  Follow up if not improving.  Plan: Ranitidine HCl 150 MG Oral Cap            278.01 Morbid obesity  266.9 Unspecified vitamin B deficiency  Comment:   Plan: VITAMIN B12 INJECTION            112.1 Yeast vaginitis  Comment: from recent antibiotic use.    Plan: Fluconazole (DIFLUCAN) 150 MG Oral Tab        Follow up if symptoms not improving.    Depression:  Has appointment with [name] today.

## 2012-02-11 NOTE — Progress Notes (Signed)
Why is patient here today? Patient is here for a ER follow up.   She went to [facility] ER on Saturday night and dx'd with a hiatal hernia. Was told to follow up here.   Still hurting. Sharp pains.     Also notes that she was at [facility] ER last Monday and dx'd with a UTI. She wants a prescription for diflucan.   Finished her ANTIBIOTIC yesterday.     Any new significant medical diagnoses since last visit? NO    Refills? NO    Referral? NO    Letter or form? NO    Lab results? NO    Health maintenance issues? NO

## 2012-02-11 NOTE — Telephone Encounter (Signed)
CONFIRMED PHONE NUMBER: [phone]  CALLERS FIRST AND LAST NAME: Pershing Proud  FACILITY NAME:  TITLE:   CALLERS RELATIONSHIP:Self  RETURN CALL: General message OK    SUBJECT: Prescription Management   REASON FOR REQUEST: patient request to forward prescriptions prescribed during [id number] visit, patient request to [facility] to [facility] [address], [place] instead    MEDICATION: from visit [id number]  CONCERN/QUESTION: [facility]  PRESCRIBING PROVIDER: ddr. Vickey Sages  DOSE TAKING NOW: na  PHARMACY NAME, LOCATION, & PHONE #: [facility] [address]

## 2012-02-12 NOTE — Telephone Encounter (Signed)
Patient informed     Closing TE

## 2012-02-13 ENCOUNTER — Telehealth (INDEPENDENT_AMBULATORY_CARE_PROVIDER_SITE_OTHER): Payer: Self-pay | Admitting: Family Medicine

## 2012-02-13 NOTE — Telephone Encounter (Signed)
The patient reported the combination of Escitalopram and Bupropion has been the most helpful.  I would suggest returning to this medication regimen.  The patient needs to a DBT program.  I would start the Lexapro and maximize this to 20mg  daily.  I would then augment with Bupropion XL and maximize this to 300mg  daily.

## 2012-02-13 NOTE — Telephone Encounter (Signed)
She misses way too many appointment for me to feel comfortable doing her psych medication. She was referred to [facility] but apparently the referral was kicked back since we have BHIP but I think she needs case management/DBT/medication management

## 2012-02-14 NOTE — Telephone Encounter (Addendum)
Agree.  Dr Ellamae Sia and [name], I am not sure that we can follow her for the below needs in our office.  See prior notes from [facility] psychiatry/case management.  Patient has had recent frequent ED visits (last at [facility]); not for psychiatric reasons, but this was one of the issues followed by case management at [facility].  Dr Hennie Duos you advise?   Thanks.    I think that it would be appropriate to refer the patient back to [facility] for case management.  hisam.

## 2012-02-15 ENCOUNTER — Other Ambulatory Visit (EMERGENCY_DEPARTMENT_HOSPITAL): Payer: Self-pay

## 2012-02-15 ENCOUNTER — Emergency Department
Admission: EM | Admit: 2012-02-15 | Discharge: 2012-02-15 | Disposition: A | Discharge status: Home / Self Care | Payer: Medicare Other | Attending: Registered Nurse | Admitting: Registered Nurse

## 2012-02-15 DIAGNOSIS — R300 Dysuria: Secondary | ICD-10-CM

## 2012-02-15 LAB — URINALYSIS COMPLETE, URN
Bilirubin (Qual), URN: NEGATIVE
Epith Cells_Renal/Trans,URN: NEGATIVE /HPF
Glucose Qual, URN: NEGATIVE mg/dL
Ketones, URN: NEGATIVE mg/dL
Leukocyte Esterase, URN: NEGATIVE
Nitrite, URN: NEGATIVE
Occult Blood, URN: NEGATIVE
Protein (Alb Semiquant), URN: NEGATIVE mg/dL
RBC, URN: NEGATIVE /HPF
Specific Gravity, URN: 1.023 g/mL (ref 1.002–1.027)
WBC, URN: NEGATIVE /HPF
pH, URN: 7 (ref 5.0–8.0)

## 2012-02-15 LAB — PREGNANCY TEST, URINE
Pregnancy Test, URN: NEGATIVE
Specific Gravity, URN: 1.024 g/mL (ref 1.002–1.027)

## 2012-02-17 LAB — URINE C/S: Colony Count: 15000

## 2012-02-18 ENCOUNTER — Encounter (INDEPENDENT_AMBULATORY_CARE_PROVIDER_SITE_OTHER): Payer: Self-pay | Admitting: Medical

## 2012-02-18 NOTE — Telephone Encounter (Signed)
Called pt and left message to call me.  Also had called [facility] which does have DBT classes and pt should be able to get in this program with her Medicare and Medicaidl.    Zipporah Plants, LICSW

## 2012-02-19 NOTE — Telephone Encounter (Signed)
Pt returned call to this social worker this morning.  Talked to her about [facility] and that this clinic does have DBT classes once you are enrolled in the program.  Provided pt with phone number and told her where it is.  Pt will call.  Relayed that she should call me back if she has a problem getting into there for services.  Zipporah Plants, LICSW

## 2012-02-20 NOTE — Telephone Encounter (Signed)
Noted. Thanks.

## 2012-03-02 ENCOUNTER — Telehealth (INDEPENDENT_AMBULATORY_CARE_PROVIDER_SITE_OTHER): Payer: Self-pay | Admitting: Social Worker

## 2012-03-02 NOTE — Telephone Encounter (Signed)
Called pt to see if she followed up with [facility].  She did go there for intake and is being assigned a case manager so evidently being tiered there.  Zipporah Plants, LICSW

## 2012-03-12 ENCOUNTER — Ambulatory Visit (INDEPENDENT_AMBULATORY_CARE_PROVIDER_SITE_OTHER): Payer: Self-pay | Admitting: Medical

## 2012-03-16 ENCOUNTER — Ambulatory Visit (INDEPENDENT_AMBULATORY_CARE_PROVIDER_SITE_OTHER): Payer: Medicare Other | Admitting: Medical

## 2012-03-16 ENCOUNTER — Encounter (INDEPENDENT_AMBULATORY_CARE_PROVIDER_SITE_OTHER): Payer: Self-pay | Admitting: Medical

## 2012-03-16 VITALS — BP 114/82 | HR 62 | Temp 97.4°F | Resp 16 | Wt 350.0 lb

## 2012-03-16 DIAGNOSIS — N3289 Other specified disorders of bladder: Secondary | ICD-10-CM

## 2012-03-16 DIAGNOSIS — E539 Vitamin B deficiency, unspecified: Secondary | ICD-10-CM

## 2012-03-16 DIAGNOSIS — M549 Dorsalgia, unspecified: Secondary | ICD-10-CM

## 2012-03-16 LAB — PR U/A AUTO DIPSTICK ONLY, ONSITE
Bilirubin (Indirect): NEGATIVE
Glucose, Urine: NEGATIVE mg/dL
Ketones, URN: NEGATIVE mg/dL
Leukocytes: NEGATIVE
Nitrite, URN: NEGATIVE
Occult Blood, URN: NEGATIVE
Protein: NEGATIVE mg/dL
Specific Gravity, URN: 1.02 (ref 1.005–1.030)
Urobilinogen, URN: NORMAL E.U./dL
pH, Whole Blood: 7 (ref 5.0–8.0)

## 2012-03-16 MED ORDER — IBUPROFEN 800 MG OR TABS
ORAL_TABLET | ORAL | Status: DC
Start: 2012-03-16 — End: 2012-04-26

## 2012-03-16 NOTE — Progress Notes (Signed)
Why is patient here today? Patient complains of urinary incontinence and spasms x 4 days   Patient complains of chaffing under pannus. Patient also requests a letter for surgeon at [facility] (Dr. Shanon Rosser) to show medical necessity for panniculectomy.    Any new significant medical diagnoses since last visit? NO    Refills? NO    Referral? NO    Letter or form? NO    Lab results? NO    Health maintenance issues? NO        Vaccine given today without initial adverse effect. Dorise Hiss MA

## 2012-03-16 NOTE — Progress Notes (Signed)
11:02 AM  Sharon Stephens  is a 36 year old female  with the following concerns:    Vitamin B12 injection    Bladder spasms for 3 days. She last had sex 6 days ago. No fever, no chills, no hematuria, no flank pain   She used Macrobid postcoital     She has now lost 204 pounds and would like to see surgeon for panniculectomy   She would like to see surgeon at [facility] who did her friends procedure. She has made an appointment to see Dr. Shanon Rosser on October 8th           OBJECTIVE:  BP 114/82  Pulse 62  Temp 97.4 F (36.3 C) (Temporal)  Resp 16  Wt 350 lb (158.759 kg)  GENERAL: obese woman with large pannus , well nourished patient in no acute distress.  Alert and oriented x3.        URINALYSIS Latest Ref Rng 03/16/2012   Color  YELLOW   Clarity  CLEAR   Specific Gravity 1.005 - 1.030 1.020   PH 5.0 - 8.0 7.0   Protein NEG-TRACE mg/dL NEG   Glucose NEG mg/dL NEG   Ketones NEG mg/dL NEG   Bilirubin NRN    Occult Blood NEG NEG   Nitrites NEG NEG   Leukocytes NEG NEG   Urobilinogen NORMAL E.U./dL NORMAL   Comments for Macroscopic NONE      596.89 Bladder spasm  (primary encounter diagnosis)  Comment: UA normal   Plan: avoid caffeine and carbonation as these may cause bladder irritation     278.01 Morbid obesity    Plan: VITAMIN B12 INJECTION, REFERRAL TO GENERAL         SURGERY      266.9 Unspecified vitamin B deficiency    Plan: VITAMIN B12 INJECTION      724.5 Back pain  Comment: ongoing   Plan: Ibuprofen 800 MG Oral Tab, URINE C&S

## 2012-03-18 LAB — URINE C/S: Colony Count: 30000

## 2012-03-29 ENCOUNTER — Encounter (HOSPITAL_BASED_OUTPATIENT_CLINIC_OR_DEPARTMENT_OTHER): Payer: Self-pay | Admitting: Registered"

## 2012-03-29 ENCOUNTER — Encounter (HOSPITAL_BASED_OUTPATIENT_CLINIC_OR_DEPARTMENT_OTHER): Payer: Medicare Other | Admitting: Surgery

## 2012-04-20 ENCOUNTER — Ambulatory Visit (INDEPENDENT_AMBULATORY_CARE_PROVIDER_SITE_OTHER): Payer: Medicare Other | Admitting: Medical

## 2012-04-20 VITALS — BP 119/85 | HR 71 | Resp 12 | Wt 338.0 lb

## 2012-04-20 DIAGNOSIS — S20219A Contusion of unspecified front wall of thorax, initial encounter: Secondary | ICD-10-CM

## 2012-04-20 DIAGNOSIS — E539 Vitamin B deficiency, unspecified: Secondary | ICD-10-CM

## 2012-04-20 NOTE — Progress Notes (Signed)
Why is patient here today? Patient complains of left sided rib pain x 6 days ago. Patient fell landing on side- see care everywhere for ED report.     Any new significant medical diagnoses since last visit? NO    Refills? NO    Referral? NO    Letter or form? NO    Lab results? NO    Health maintenance issues? NO

## 2012-04-20 NOTE — Progress Notes (Signed)
11:17 AM  Sharon Stephens  is a 36 year old female  with the following concerns:    Presented at [facility] ED on 10/12 after she fell when a friend who was walking behind [place] ans tripped and fell on [address]. [name] fell onto a weight bench hitting her left side.   At this time she is having a 6-7/10, left sided lateral upper chest wall pain   Pain is constant and is exacerbated when she moves or coughs  Her sleep is interrupted because she is unable to sleep on her left side as she typically does  She is taking Motrin and percocet. Motrin does not help as much but the percocet does help     No cough   No SOB    Patient Active Problem List   Diagnosis   . BENIGN HYPERTENSION   . MIGRAINE NOS INTRACTABLE   . MORBID OBESITY   . CHEST PAIN NOS   . NONSPEC ABNL PAP SMEAR CERVIX, UNSPEC   . Bariatric surgery status   . IUD (intrauterine device) in place   . Suicide attempt   . Homeless   . UTI (lower urinary tract infection)   . Eczema   . Malabsorption   . Hypokalemia     OBJECTIVE:  BP 119/85  Pulse 71  Resp 12  Wt 338 lb (153.316 kg)  GENERAL: obese, well nourished patient in no acute distress.  Alert and oriented x3.    SKIN: ecchymosis on upper left arm and left lower flank area   PULM:  Clear to auscultation bilaterally.  No wheezes, rales, or rhonchi.    MSK:  Tenderness to palpation along left lateral chest wall     922.1 Chest wall contusion  (primary encounter diagnosis)  Comment: ED notes reviewed  Chest X ray result in ED  "No findings of displaced rib fracture, pneumothorax or pleural   effusion.    No acute cardiopulmonary process"     Plan: continue to use Motinr and percocet as needed for pain   Return to clinic if pain fails to resolve.     266.9 Unspecified vitamin B deficiency    Plan: VITAMIN B12 INJECTION, VITAMIN B12 INJECTION

## 2012-04-26 ENCOUNTER — Encounter (INDEPENDENT_AMBULATORY_CARE_PROVIDER_SITE_OTHER): Payer: Self-pay | Admitting: Medical

## 2012-04-26 ENCOUNTER — Ambulatory Visit (INDEPENDENT_AMBULATORY_CARE_PROVIDER_SITE_OTHER): Payer: Medicare Other | Admitting: Family Medicine

## 2012-04-26 VITALS — BP 125/82 | HR 60 | Temp 98.2°F | Resp 16 | Wt 340.8 lb

## 2012-04-26 DIAGNOSIS — R21 Rash and other nonspecific skin eruption: Secondary | ICD-10-CM

## 2012-04-26 DIAGNOSIS — N898 Other specified noninflammatory disorders of vagina: Secondary | ICD-10-CM

## 2012-04-26 DIAGNOSIS — S20219A Contusion of unspecified front wall of thorax, initial encounter: Secondary | ICD-10-CM

## 2012-04-26 DIAGNOSIS — N920 Excessive and frequent menstruation with regular cycle: Secondary | ICD-10-CM

## 2012-04-26 DIAGNOSIS — E65 Localized adiposity: Secondary | ICD-10-CM | POA: Insufficient documentation

## 2012-04-26 MED ORDER — NYSTATIN 100000 UNIT/GM EX CREA
TOPICAL_CREAM | CUTANEOUS | Status: DC
Start: 2012-04-26 — End: 2013-01-18

## 2012-04-26 MED ORDER — OXYCODONE-ACETAMINOPHEN 5-325 MG OR TABS
ORAL_TABLET | ORAL | Status: DC
Start: 2012-04-26 — End: 2012-05-31

## 2012-04-26 MED ORDER — OXYCODONE-ACETAMINOPHEN 5-325 MG OR TABS
ORAL_TABLET | ORAL | Status: DC
Start: 2012-04-26 — End: 2012-04-26

## 2012-04-26 NOTE — Progress Notes (Signed)
SUBJECTIVE:  Sharon Stephens is a 36 year old female here to discuss various concerns. She is concerned about an itchy rash under left panus for a few days. She gets these often due to weight loss after bariatric surgery. She usually tries hydrogen peroxide. No pain in area. She is also reporting pain in left sided chest wall after trauma when she fell onto exercise work bench. She was seen in ER one week ago and then by PCP. She is using percocet two daily and ibuprofen for the pain but she has been helping friend since discharge from the hospital and feels that the increased work is aggravating the pain. No shortness of breath. She is also wondering if it is normal to have a slight brown discharge which is reminiscent of a very light period since she got her mirena IUD. Usually may last a few days. No unusual vaginal discharge, no STI concerns as she is sexually active in monogamous relationship with one partner and no dyspareunia.       ROS:  Negative for shortness of breath, chest pain, painful rash, urinary symptoms, cough  Positive for as in HPI      Problem List:  Patient Active Problem List   Diagnosis   . BENIGN HYPERTENSION   . MIGRAINE NOS INTRACTABLE   . MORBID OBESITY   . CHEST PAIN NOS   . NONSPEC ABNL PAP SMEAR CERVIX, UNSPEC   . Bariatric surgery status   . IUD (intrauterine device) in place   . Suicide attempt   . Homeless   . UTI (lower urinary tract infection)   . Eczema   . Malabsorption   . Hypokalemia       Pertinent past medical history, med list and allergy history reviewed.    OBJECTIVE:  BP 125/82  Pulse 60  Temp 98.2 F (36.8 C) (Oral)  Resp 16  Wt 340 lb 12.8 oz (154.586 kg)  SpO2 98%  General: well appearing, in no distress  HEENT: eyes nl  Neck: supple  Heart: nl S1S2, no murmur  Lungs: CTA Bil   Chest wall:tenderness to palpation on left lower ribs with no overlying skin chages  Abdomen: soft, nontender,  Non distended   Skin: under left panus area of erythematous wet rash  confluent in places with some satellite lesions, no vesicles      ASSESSMENT/PLAN:     1. Chest wall contusion  (primary encounter diagnosis)  Comment: improving but not resolved due to increased activity with friend.  Plan: Oxycodone-Acetaminophen (PERCOCET) 5-325 MG         Oral Tab        Refilled for 10 tabs due to concern for history of OD in the past. She contracts for safety and no further refills should be needed. Continue with ibuprofen PRN.    2.  Rash  Comment: under left panus. Consistent with yeast dermatitis  Plan: Nystatin 100000 UNIT/GM External Cream        Trial if not improving.     3. Brownish discharge-normal with mirena IUD. Return to clinic if continues or with new symptoms.         Follow up: prn

## 2012-04-26 NOTE — Addendum Note (Signed)
Addended by: Tonye Royalty on: 04/26/2012 12:45 PM     Modules accepted: Orders

## 2012-04-28 ENCOUNTER — Telehealth (INDEPENDENT_AMBULATORY_CARE_PROVIDER_SITE_OTHER): Payer: Self-pay | Admitting: Medical

## 2012-04-28 NOTE — Telephone Encounter (Signed)
CONFIRMED PHONE NUMBER: [phone]  CALLERS FIRST AND LAST NAME: Pershing Proud    FACILITY NAME: NA TITLE: NA  CALLERS RELATIONSHIP:Self  RETURN CALL: General message OK    SUBJECT: General Message   REASON FOR REQUEST: Patient talked with social worker and seeing about getting placed back on medication.  Please call back and advise.    MESSAGE: See above.

## 2012-04-29 NOTE — Telephone Encounter (Signed)
Message forwarded to this social worker regarding this pt who is being followed at [facility] for psychiatric needs.  Pt relates she is getting very depressed.  She missed appointment with psychiatrist at [facility] because [facility] was in jail, rescheduled for 11-19.  Pt relates she wants to get back on Wellbutrin.  PT relates she has a visitation with her children tomorrow arranged per CPS and cannot see them without her needed medication.  Pt did talk to case manager at [facility] but cannot get appointment with medication prescriber sooner.   Relayed to pt that I would discuss issue with her PCP.   Zipporah Plants, LICSW

## 2012-04-29 NOTE — Telephone Encounter (Signed)
She needs appt with PCP to discuss.

## 2012-04-29 NOTE — Telephone Encounter (Signed)
Please see below. Patient must be seen to get any medications prescribed. Please assist with appointment. Thank you!

## 2012-04-30 ENCOUNTER — Ambulatory Visit (INDEPENDENT_AMBULATORY_CARE_PROVIDER_SITE_OTHER): Payer: Medicare Other | Admitting: Family Medicine

## 2012-04-30 ENCOUNTER — Other Ambulatory Visit: Payer: Self-pay | Admitting: Pharmacist

## 2012-04-30 NOTE — Telephone Encounter (Signed)
Patient is scheduled today at 31 with Dr. Gearldine Shown. I will route this TE to Dr. Gearldine Shown as an Lorain Childes.

## 2012-04-30 NOTE — Telephone Encounter (Signed)
Medication discontinued from med list on 03/16/2012.  Re-start?  Sertraline 100mg  tablet.  Last refilled on 05/06/2011 for #[id number]  Patient has an appointment with you today on 04/30/12 .  Please address patient's refill requests at the appt for the following medication(s):  Sertraline 100mg  tablet.

## 2012-04-30 NOTE — Telephone Encounter (Signed)
Will address at visit.

## 2012-04-30 NOTE — Telephone Encounter (Signed)
I will not be in the clinic until Monday. Other than several years ago, it looks like this medication has been prescribed by outside providers. [facility] should be doing her psych medications   Since her visitation with her children is contingent on her starting medication, is it possible for her to be seen today to assess refilling this?

## 2012-04-30 NOTE — Telephone Encounter (Signed)
Noted  

## 2012-05-04 ENCOUNTER — Ambulatory Visit (INDEPENDENT_AMBULATORY_CARE_PROVIDER_SITE_OTHER): Payer: Medicare Other | Admitting: Family Medicine

## 2012-05-10 ENCOUNTER — Encounter (HOSPITAL_BASED_OUTPATIENT_CLINIC_OR_DEPARTMENT_OTHER): Payer: Self-pay | Admitting: Registered"

## 2012-05-10 ENCOUNTER — Encounter (HOSPITAL_BASED_OUTPATIENT_CLINIC_OR_DEPARTMENT_OTHER): Payer: Medicare Other | Admitting: Registered"

## 2012-05-10 ENCOUNTER — Encounter (HOSPITAL_BASED_OUTPATIENT_CLINIC_OR_DEPARTMENT_OTHER): Payer: Medicare Other | Admitting: Surgery

## 2012-05-11 ENCOUNTER — Ambulatory Visit (INDEPENDENT_AMBULATORY_CARE_PROVIDER_SITE_OTHER): Payer: Medicare Other | Admitting: Family Medicine

## 2012-05-11 ENCOUNTER — Encounter (INDEPENDENT_AMBULATORY_CARE_PROVIDER_SITE_OTHER): Payer: Self-pay | Admitting: Family Medicine

## 2012-05-11 VITALS — BP 125/84 | HR 73 | Temp 97.9°F | Resp 16 | Wt 346.0 lb

## 2012-05-11 DIAGNOSIS — F32A Depression, unspecified: Secondary | ICD-10-CM | POA: Insufficient documentation

## 2012-05-11 DIAGNOSIS — T1491XA Suicide attempt, initial encounter: Secondary | ICD-10-CM

## 2012-05-11 DIAGNOSIS — G43909 Migraine, unspecified, not intractable, without status migrainosus: Secondary | ICD-10-CM

## 2012-05-11 DIAGNOSIS — F3289 Other specified depressive episodes: Secondary | ICD-10-CM

## 2012-05-11 DIAGNOSIS — X838XXA Intentional self-harm by other specified means, initial encounter: Secondary | ICD-10-CM

## 2012-05-11 MED ORDER — SUMATRIPTAN SUCCINATE 50 MG OR TABS
ORAL_TABLET | ORAL | Status: DC
Start: 2012-05-11 — End: 2012-09-01

## 2012-05-11 MED ORDER — FLUOXETINE HCL 10 MG OR TABS
ORAL_TABLET | ORAL | Status: DC
Start: 2012-05-11 — End: 2012-05-26

## 2012-05-11 NOTE — Progress Notes (Signed)
PATIENT HEALTH QUESTIONNAIRE (PHQ-9)  Over the last 2 weeks, how often have you been bothered by any of the following problems?    1. Little interest or pleasure in doing things: several days  (1)  2. Feeling down, depressed, or hopeless: nearly every day  (3)  3. Trouble falling or staying asleep,or sleeping too much: more than half the days (2)  4. Feeling tired or having little energy: nearly every day  (3)  5. Poor appetite or overeating: nearly every day  (3)  6. Feeling bad about yourself--or that you are a failure or have let yourself or your family down: nearly every day  (3)  7. Trouble concentrating on things, such as reading the newspaper or watching television: several days  (1)  8. Moving or speaking so slowly that other people could have noticed. Or the opposite--being so fidgety or restless that you have been moving around a lot  more than usual: not at all (0)  9. Thoughts that you would be better off dead, or of hurting yourself in some way: not at all (0)    Total Score Depression Severity:  16  1-4 Minimal depression  5-9 Mild depression  10-14 Moderate depression  15-19 Moderately severe depression  20-27 Severe depression    10. If you checked off any problems, how difficult have these problems made it for  you to do your work, take care of things at home, or get along with other people? Extremely difficult  ---------------------------------------------------------------------------------------------------------  PHQ-9 is adapted from [employer], developed by Drs Molly Maduro L. Spitzer, Nance Pear, Tora Perches, and colleagues, with an educational grant from [employer]. For research information, contact Dr Everlene Other at [email or web address]. Use of the PHQ-9 may only be made in accordance with the Terms of Use available at ThisOrder.com.ee. Copyright [employer]. All rights reserved. [employer] is a Designer, multimedia.    GAD-7 (Generalized Anxiety Disorder screening  tool)  How often during the past 2 weeks have you felt bothered by:  0 = not at all  1 = several days  2 = more than half the days  3 = nearly everyday   1. Feeling nervous, anxious, or on edge  1   2. Not being able to stop or control worrying  3   3. Worrying too much about different things  1   4. Trouble relaxing  3   5. Being so restless that it is hard to sit still  0   6. Becoming easily annoyed or irritable  2   7. Feeling afraid as if something awful might happen  3                                          TOTAL:   13   If you checked off any problems, how difficult have these problems made it for you to do your work, take care of things at home, or get along with other people? extremely difficult    Scoring: Add the results for question number one through seven to get a total score.  If you score 10 or above you might want to consider one or more of the following: discuss your symptoms with your doctor, contact a local mental health care provider or contact my office for further assessment and possible treatment. Although these questions serve as a  useful guide, only an appropriate licensed health professional can make the diagnosis of Generalized Anxiety Disorder.

## 2012-05-11 NOTE — Progress Notes (Addendum)
SUBJECTIVE:  Sharon Stephens is a 36 year old female here to discuss depression and anxiety with no history of bipolar. Last medication was 3-4 months ago and she stopped due to nausea. Medication was started while she was hospitalized at [facility]. She was on it for about eight months. Initially she was tolerating but then dose increased and she developed side effects. Prior to the psych hospitalization she had been on citalopram and wellbutrin for about three years prior to that hospitalization.     Currently she is having crying spells, moody and irritable. She is having increased appetite, sleeping more. She is also haivng with concentration and focusing. No fatigue. She denies any anxiety. No history of seizures, no manic episodes ever. She has an appointment on 11/19 with psych [facility].     She would also like a refill of her imitrex which she uses prn and not weekly.     ROS:  Negative for SI or HI.   Positive for as in HPI      Problem List:  Patient Active Problem List   Diagnosis   . BENIGN HYPERTENSION   . MIGRAINE NOS INTRACTABLE   . MORBID OBESITY   . CHEST PAIN NOS   . NONSPEC ABNL PAP SMEAR CERVIX, UNSPEC   . Bariatric surgery status   . IUD (intrauterine device) in place   . Suicide attempt   . Homeless   . UTI (lower urinary tract infection)   . Eczema   . Malabsorption   . Hypokalemia   . Abdominal panniculus       Pertinent past medical history, med list and allergy history reviewed.    OBJECTIVE:  BP 125/84  Pulse 73  Temp(Src) 97.9 F (36.6 C) (Temporal)  Resp 16  Wt 346 lb (156.945 kg)  BMI 57.23 kg/m2  SpO2 97%  General: well appearing, in no distress  HEENT: eyes nl  Neck: supple, no lymphadenopathy    Reviewed [facility] from [facility] during hospitalizations.     ASSESSMENT/PLAN:     1.Depression-discussed various treatment options with patient. Decided to try fluoxetine since she has not responded to citalopram or sertraline in the past. Will consider adding wellburin. Will let psych  provider at [facility] take over.     2. Migraine headaches-controlled on sumatriptan. Refill given.       Follow up: as scheduled in two weeks    Total time of approximately 30 minutes was spent face-to-face with the patient, of which more than 50% was spent counseling  as outlined in this note.

## 2012-05-26 ENCOUNTER — Encounter (INDEPENDENT_AMBULATORY_CARE_PROVIDER_SITE_OTHER): Payer: Self-pay | Admitting: Family Medicine

## 2012-05-26 ENCOUNTER — Ambulatory Visit (INDEPENDENT_AMBULATORY_CARE_PROVIDER_SITE_OTHER): Payer: Medicare Other | Admitting: Family Medicine

## 2012-05-26 VITALS — BP 145/86 | HR 70 | Temp 97.0°F | Resp 18 | Wt 341.0 lb

## 2012-05-26 DIAGNOSIS — L259 Unspecified contact dermatitis, unspecified cause: Secondary | ICD-10-CM

## 2012-05-26 DIAGNOSIS — E539 Vitamin B deficiency, unspecified: Secondary | ICD-10-CM

## 2012-05-26 DIAGNOSIS — Z9884 Bariatric surgery status: Secondary | ICD-10-CM

## 2012-05-26 DIAGNOSIS — L408 Other psoriasis: Secondary | ICD-10-CM

## 2012-05-26 DIAGNOSIS — L409 Psoriasis, unspecified: Secondary | ICD-10-CM

## 2012-05-26 DIAGNOSIS — L309 Dermatitis, unspecified: Secondary | ICD-10-CM

## 2012-05-26 DIAGNOSIS — F32A Depression, unspecified: Secondary | ICD-10-CM

## 2012-05-26 DIAGNOSIS — E538 Deficiency of other specified B group vitamins: Secondary | ICD-10-CM

## 2012-05-26 DIAGNOSIS — F3289 Other specified depressive episodes: Secondary | ICD-10-CM

## 2012-05-26 DIAGNOSIS — M722 Plantar fascial fibromatosis: Secondary | ICD-10-CM

## 2012-05-26 MED ORDER — HYDROCORTISONE 2.5 % EX CREA
TOPICAL_CREAM | CUTANEOUS | Status: DC
Start: 2012-05-26 — End: 2014-02-16

## 2012-05-26 MED ORDER — IBUPROFEN 800 MG OR TABS
ORAL_TABLET | ORAL | Status: DC
Start: 2012-05-26 — End: 2012-12-15

## 2012-05-26 MED ORDER — CYANOCOBALAMIN 1000 MCG/ML IJ SOLN
1000.0000 ug | INTRAMUSCULAR | Status: AC
Start: 2012-05-26 — End: 2013-06-20
  Administered 2012-05-26 – 2013-02-22 (×4): 1000 ug via INTRAMUSCULAR

## 2012-05-26 NOTE — Progress Notes (Signed)
SUBJECTIVE:  Sharon Stephens is a 36 year old female here to discuss various concerns. She is following up on depression. She is tolerating the sertraline and is seeing at [facility] with case manager and therapist. They are managing her medications and have increased dose from 10 to 20 mg daily. No side effects. No SI or HI.     She is experiencing pain in heels upon waking up. No swelling, no trauma, no paresthesias. She is using good shoes. She will be working this Saturday for eight hours and she is concerned. No prior episodes.    Her eczema on her face is acting up with dry itchy skin above lip. No other areas.         ROS:  Negative for SI or HI, nausea, vomiting  Positive for as in HPI      Problem List:  Patient Active Problem List   Diagnosis   . BENIGN HYPERTENSION   . MIGRAINE NOS INTRACTABLE   . MORBID OBESITY   . CHEST PAIN NOS   . NONSPEC ABNL PAP SMEAR CERVIX, UNSPEC   . Bariatric surgery status   . IUD (intrauterine device) in place   . Suicide attempt   . Homeless   . UTI (lower urinary tract infection)   . Eczema   . Malabsorption   . Hypokalemia   . Abdominal panniculus   . Depression       Pertinent past medical history, med list and allergy history reviewed. No tobacco.     OBJECTIVE:  BP 145/86  Pulse 70  Temp(Src) 97 F (36.1 C) (Temporal)  Resp 18  Wt 341 lb (154.677 kg)  BMI 56.4 kg/m2  SpO2 100%  General: well appearing, in no distress  HEENT: eyes nl  Neck: supple  Skin: dry scaly skin in patches above lip  MSK:  TTP at base of heel, no swelling or skin changes, no deformities  Full ROM and non tender at ankles  Psych: normal mood and affect    Labs:  GAD 7 score of 0  PHQ9 score of 2:  PATIENT HEALTH QUESTIONNAIRE (PHQ-9)  Over the last 2 weeks, how often have you been bothered by any of the following problems?    1. Little interest or pleasure in doing things: not at all (0)  2. Feeling down, depressed, or hopeless: not at all (0)  3. Trouble falling or staying asleep,or sleeping too  much: several days  (1)  4. Feeling tired or having little energy: several days  (1)  5. Poor appetite or overeating: not at all (0)  6. Feeling bad about yourself--or that you are a failure or have let yourself or your family down: not at all (0)  7. Trouble concentrating on things, such as reading the newspaper or watching television: not at all (0)  8. Moving or speaking so slowly that other people could have noticed. Or the opposite--being so fidgety or restless that you have been moving around a lot  more than usual: not at all (0)  9. Thoughts that you would be better off dead, or of hurting yourself in some way: not at all (0)    Total Score Depression Severity  1-4 Minimal depression  5-9 Mild depression  10-14 Moderate depression  15-19 Moderately severe depression  20-27 Severe depression    10. If you checked off any problems, how difficult have these problems made it for  you to do your work, take care of things at  home, or get along with other people? Not difficult at all   ---------------------------------------------------------------------------------------------------------  PHQ-9 is adapted from [employer], developed by Drs Molly Maduro L. Spitzer, Nance Pear, Tora Perches, and colleagues, with an educational grant from [employer]. For research information, contact Dr Everlene Other at [email or web address]. Use of the PHQ-9 may only be made in accordance with the Terms of Use available at ThisOrder.com.ee. Copyright [employer]. All rights reserved. [employer] is a Child psychotherapist [employer].      ASSESSMENT/PLAN:     1. Heel pain-findings consistent with plantar fasciitis. Recommend heel cups, exercises, CRS hand out given on AVS. Return to clinic or go to the emergency room if symptoms do not improve, if symptoms worsen in any way or with any new symptoms. Letter written to give to work to allow chair for resting.    2. Eczema-hydrocortisone cream to use as needed, moisturizers prn.      3. Vitamin B 12 deficiency-due for injection. Administered today. Return in one month for repeat as needed Q30 days-order placed.     4. Depression-improved per GAD 7 and PHQ9. Continue follow-up at [facility].    Follow up: prn

## 2012-05-26 NOTE — Progress Notes (Signed)
PATIENT HEALTH QUESTIONNAIRE (PHQ-9)  Over the last 2 weeks, how often have you been bothered by any of the following problems?    1. Little interest or pleasure in doing things: not at all (0)  2. Feeling down, depressed, or hopeless: not at all (0)  3. Trouble falling or staying asleep,or sleeping too much: several days  (1)  4. Feeling tired or having little energy: several days  (1)  5. Poor appetite or overeating: not at all (0)  6. Feeling bad about yourself--or that you are a failure or have let yourself or your family down: not at all (0)  7. Trouble concentrating on things, such as reading the newspaper or watching television: not at all (0)  8. Moving or speaking so slowly that other people could have noticed. Or the opposite--being so fidgety or restless that you have been moving around a lot  more than usual: not at all (0)  9. Thoughts that you would be better off dead, or of hurting yourself in some way: not at all (0)    Total Score Depression Severity:  2  1-4 Minimal depression  5-9 Mild depression  10-14 Moderate depression  15-19 Moderately severe depression  20-27 Severe depression    10. If you checked off any problems, how difficult have these problems made it for  you to do your work, take care of things at home, or get along with other people? Not difficult at all   ---------------------------------------------------------------------------------------------------------  PHQ-9 is adapted from [employer], developed by Drs Molly Maduro L. Spitzer, Nance Pear, Tora Perches, and colleagues, with an educational grant from [employer]. For research information, contact Dr Everlene Other at [employer] .[email or web address]. Use of the PHQ-9 may only be made in accordance with the Terms of Use available at ThisOrder.com.ee. Copyright [employer]. All rights reserved. [employer] is a Designer, multimedia.      GAD-7 (Generalized Anxiety Disorder screening tool)  How often during the past 2 weeks have  you felt bothered by:  0 = not at all  1 = several days  2 = more than half the days  3 = nearly everyday   1. Feeling nervous, anxious, or on edge  0   2. Not being able to stop or control worrying  0   3. Worrying too much about different things  1   4. Trouble relaxing  1   5. Being so restless that it is hard to sit still  0   6. Becoming easily annoyed or irritable  0   7. Feeling afraid as if something awful might happen  0                                          TOTAL:   0   If you checked off any problems, how difficult have these problems made it for you to do your work, take care of things at home, or get along with other people? not difficult at all    Scoring: Add the results for question number one through seven to get a total score.  If you score 10 or above you might want to consider one or more of the following: discuss your symptoms with your doctor, contact a local mental health care provider or contact my office for further assessment and possible treatment. Although these questions serve as  a useful guide, only an appropriate licensed health professional can make the diagnosis of Generalized Anxiety Disorder.

## 2012-05-26 NOTE — Progress Notes (Signed)
Per orders of Dr. Louann Liv, injection of 1,000 mcg Vitamin B-12 given by Fransisca Kaufmann, CMA. Patient instructed to remain in clinic for 20 minutes afterwards, and to report any adverse reaction to me immediately.

## 2012-05-26 NOTE — Patient Instructions (Addendum)
Use heel cups.     Index Spanish version Illustration      Plantar Fasciitis   What is plantar fasciitis?   Plantar fasciitis is a painful inflammation of the bottom of the foot between the ball of the foot and the heel.   How does it occur?   There are several possible causes of plantar fasciitis, including:    wearing high heels   gaining weight   increased walking, standing, or stair-climbing  If you wear high-heeled shoes, including western-style boots, for long periods of time, the tough, tendonlike tissue of the bottom of your foot can become shorter. This layer of tissue is called fascia. Pain occurs when you stretch fascia that has shortened. This painful stretching might happen, for example, when you walk barefoot after getting out of bed in the morning.   If you gain weight, you might be more likely to have plantar fasciitis, especially if you walk a lot or stand in shoes with poor heel cushioning. Normally there is a pad of fatty tissue under your heel bone. Weight gain might break down this fat pad and cause heel pain.   Runners may get plantar fasciitis when they change their workout and increase their mileage or frequency of workouts. It can also occur with a change in exercise surface or terrain, or if your shoes are worn out and don't provide enough cushion for your heels.   If the arches of your foot are abnormally high or low, you are more likely to develop plantar fasciitis than if your arches are normal.   What are the symptoms?   The main symptom of plantar fasciitis is heel pain when you walk. You may also feel pain when you stand and possibly even when you are resting. This pain typically occurs first thing in the morning after you get out of bed, when your foot is placed flat on the floor. The pain occurs because you are stretching the plantar fascia. The pain usually lessens with more walking, but you may have it again after periods of rest.   You may feel no pain when you are sleeping  because the position of your feet during rest allows the fascia to shorten and relax.   How is it diagnosed?   Your healthcare provider will ask about your symptoms. He or she will ask if the bottom of your heel is tender and if you have pain when you stretch the bottom of your foot. An X-ray of your heel may be done.   How is it treated?    Give your painful heel lots of rest. You may need to stay completely off your foot for several days when the pain is severe.   Your healthcare provider may recommend or prescribe anti-inflammatory medicines, such as aspirin or ibuprofen. These drugs decrease pain and inflammation. Adults aged 40 years and older should not take non-steroidal anti-inflammatory medicine for more than 7 days without their healthcare provider's approval. Resting your heel on an ice pack for a few minutes several times a day can also help.   Try to cushion your foot. You can do this by wearing athletic shoes, even at work, for [employer]. Heel cushions can also be used. The cushions should be worn in both shoes. They are most helpful if you are overweight or an older adult.   Your provider may recommend special arch supports or inserts for your shoes called orthotics, either custom-made or off the shelf. These supports can be  particularly helpful if you have flat feet or high arches.   Your provider may recommend an injection of a cortisone-like medicine.   Lose weight if needed.   A night splint may be recommended. This will keep the plantar fascia stretched while you are sleeping.   Physical therapy for additional treatments may be recommended   Surgery is rarely needed.  How long will the effects last?   You may find that the pain is sometimes worse and sometimes better over time. If you get treatment soon after you notice the pain, the symptoms should stop after several weeks. If, however, you have had plantar fasciitis for a long time, it may take many weeks to months for the pain to go  away.   When can I return to my normal activities?   Everyone recovers from an injury at a different rate. Return to your activities will be determined by how soon your foot recovers, not by how many days or weeks it has been since your injury has occurred. In general, the longer you have symptoms before you start treatment, the longer it will take to get better. The goal of rehabilitation is to return you to your normal activities as soon as is safely possible. If you return too soon you may worsen your injury.   You may safely return to your activities when, starting from the top of the list and progressing to the end, each of the following is true:    You have full range of motion in the injured foot compared with the uninjured foot.   You have full strength of the injured foot compared with the uninjured foot.   You can walk straight ahead without significant pain or limping.  How can I prevent plantar fasciitis?   The best way to prevent plantar fasciitis is to wear shoes that are well made and fit your feet. This is especially important when you exercise or walk a lot or stand for a long time on hard surfaces. Get new athletic shoes before your old shoes stop supporting and cushioning your feet.   You should also:    Avoid repeated jarring to the heel.   Keep a healthy weight.   Do your leg and foot stretching exercises regularly.  Developed by [employer].   Published by [employer].   Last modified: 2007-02-15   Last reviewed: 2007-01-11   This content is reviewed periodically and is subject to change as new health information becomes available. The information is intended to inform and educate and is not a replacement for medical evaluation, advice, diagnosis or treatment by a healthcare professional.   Adult Health Advisor [phone] Index   2009 [employer] and/or its affiliates. All Rights Reserved.    Index    Index       Index Illustration Illustration         Plantar Fasciitis Rehabilitation  Exercises   You may begin exercising the muscles of your foot right away by gently stretching them as follows:    Prone hip extension: Lie on your stomach with your legs straight out behind you. Tighten the buttocks and thigh muscles of the leg on your injured side and lift the leg off the floor about 8 inches. Keep your leg straight. Hold for 5 seconds. Then lower your leg and relax. Do 3 sets of 10.   Towel stretch: Sit on a hard surface with your injured leg stretched out in front of you. Loop a towel around your toes  and the ball of your foot and pull the towel toward your body keeping your leg straight. Hold this position for 15 to 30 seconds and then relax. Repeat 3 times.   When the towel stretch becomes too easy, you may begin doing the standing calf stretch.    Standing calf stretch: Facing a wall, put your hands against the wall at about eye level. Keep your injured leg back with your heel on the floor. Keep the other leg forward. Turn your back foot slightly inward (as if you were pigeon-toed) and slowly lean into the wall until you feel a stretch in the back of your calf. Hold for 15 to 30 seconds and then relax. Repeat 3 times. Do this exercise several times each day.   Sitting plantar fascia stretch: Sit in a chair and cross one foot over your other knee. Grab the base of your toes and pull them back toward your leg until you feel a comfortable stretch. Hold 15 seconds and repeat 3 times.  When you can stand comfortably on your injured foot, you can begin standing to stretch the bottom of your foot using the plantar fascia stretch.    Achilles stretch: Stand with the ball of one foot on a stair. Reach for the bottom step with your heel until you feel a stretch in the arch of your foot. Hold this position for 15 to 30 seconds and then relax. Repeat 3 times.  After you have stretched the bottom muscles of your foot, you can begin strengthening the top muscles of your foot.    Frozen can roll: Roll  your bare injured foot back and forth from your heel to your mid-arch over a frozen juice can. Repeat for 3 to 5 minutes. This exercise is particularly helpful if done first thing in the morning.   Towel pickup: With your heel on the ground, pick up a towel with your toes. Release. Repeat 10 to 20 times. When this gets easy, add more resistance by placing a book or small weight on the towel.   Balance and reach exercises   Stand next to a chair with your injured leg further from the chair. The chair will provide support if you need it. Stand on just the foot of your injured leg. Try to raise the arch of this foot while keeping your toes on the floor.   A. Keep your foot in this position and with the hand that is further away from the chair, reach forward in front of you. Allow the knee on your injured side to bend. Repeat this 10 times while keeping the arch height. To make the exercise more challenging, reach farther in front of you. Do 2 sets of 10.  B. Stand in the same position as above. While keeping your arch height, reach the hand that is further away from the chair across your body toward the chair. The farther you reach, the more challenging the exercise. Do 2 sets of 10.   Heel raise: Balance yourself while standing behind a chair or counter. Using the chair to help you, raise your body up onto your toes and hold for 5 seconds. Then slowly lower yourself down without holding onto the chair. (Keep holding onto the chair or counter if you need to.) When this exercise becomes less painful, try lowering yourself down on the injured leg only. Repeat 10 times. Do 3 sets of 10. Rest 30 seconds between sets.   Side-lying leg lift: Lie on your  uninjured side. Tighten the front thigh muscles on your injured leg and lift that leg 8 to 10 inches away from the other leg. Keep the leg straight and lower it slowly. Do 3 sets of 10.  Written by Modesta Messing, MS, PT, and Iris Pert, PT, DHSc, OCS, for [employer].    Published by [employer].   Last modified: 2008-03-07   Last reviewed: 2007-01-11   This content is reviewed periodically and is subject to change as new health information becomes available. The information is intended to inform and educate and is not a replacement for medical evaluation, advice, diagnosis or treatment by a healthcare professional.   Sports Medicine Advisor 2009.3 Index  Sports Medicine Advisor 2009.3 Credits    2009 [employer] and/or its affiliates. All Rights Reserved.

## 2012-06-09 ENCOUNTER — Encounter (INDEPENDENT_AMBULATORY_CARE_PROVIDER_SITE_OTHER): Payer: Self-pay | Admitting: Family Medicine

## 2012-06-09 ENCOUNTER — Ambulatory Visit (INDEPENDENT_AMBULATORY_CARE_PROVIDER_SITE_OTHER): Payer: Medicare Other | Admitting: Family Medicine

## 2012-06-09 VITALS — BP 137/94 | HR 74 | Temp 96.2°F | Resp 16 | Wt 341.0 lb

## 2012-06-09 DIAGNOSIS — J40 Bronchitis, not specified as acute or chronic: Secondary | ICD-10-CM

## 2012-06-09 DIAGNOSIS — N390 Urinary tract infection, site not specified: Secondary | ICD-10-CM

## 2012-06-09 MED ORDER — GUAIFENESIN-CODEINE 100-10 MG/5ML OR SYRP
ORAL_SOLUTION | ORAL | Status: DC
Start: 2012-06-09 — End: 2012-12-15

## 2012-06-09 MED ORDER — AZITHROMYCIN 250 MG OR TABS
ORAL_TABLET | ORAL | Status: AC
Start: 2012-06-09 — End: 2012-06-14

## 2012-06-09 MED ORDER — ALBUTEROL SULFATE HFA 108 (90 BASE) MCG/ACT IN AERS
INHALATION_SPRAY | RESPIRATORY_TRACT | Status: DC
Start: 2012-06-09 — End: 2013-07-11

## 2012-06-09 MED ORDER — NITROFURANTOIN MACROCRYSTAL 50 MG OR CAPS
ORAL_CAPSULE | ORAL | Status: DC
Start: 2012-06-09 — End: 2012-12-15

## 2012-06-09 NOTE — Progress Notes (Signed)
SUBJECTIVE:  Sharon Stephens is a 36 year old female here to discuss 10 day history of productive cough, malaise, some shortness of breath with wheezing at night time. No fever or chills. She is having a sore throat and sinus congestion. No nausea, vomiting or diarrhea. No respiratory distress or DOE. She last had a bronchitis about one year ago. She has been treated with inhalers and prednisone in the past for history of mild asthma with exacerbation. No sick contacts. She missed work today but would like to return tomorrow.     She would also like a refill of the macrobid as this is working well for her to prevent UTI's associated with intercourse.     ROS:  Negative for fever, chills, vomiting, sinus pressure  Positive for as in HPI      Problem List:  Patient Active Problem List   Diagnosis   . BENIGN HYPERTENSION   . MIGRAINE NOS INTRACTABLE   . MORBID OBESITY   . CHEST PAIN NOS   . NONSPEC ABNL PAP SMEAR CERVIX, UNSPEC   . Bariatric surgery status   . IUD (intrauterine device) in place   . Suicide attempt   . Homeless   . UTI (lower urinary tract infection)   . Eczema   . Malabsorption   . Hypokalemia   . Abdominal panniculus   . Depression       Pertinent past medical history, med list and allergy history reviewed. She does have a history of mild asthma. No tobacco use.     OBJECTIVE:  BP 137/94  Pulse 74  Temp(Src) 96.2 F (35.7 C) (Temporal)  Resp 16  Wt 341 lb (154.677 kg)  BMI 56.4 kg/m2  SpO2 100%  General: well appearing, in no distress  HEENT: eyes nl, TMs nl, OP nl, nose nl  Neck: supple, no lymphadenopathy, nl thyroid  Heart: nl S1S2, no murmur  Lungs: CTA Bil with good air exchange  Back: normal       ASSESSMENT/PLAN:     1. Bronchitis-will treat with azithromycin, albuterol inhaler, OTC decongestant, robitussin with codeine. Rest, fluids, work note given. Return to clinic or go to the emergency room if symptoms do not improve, if symptoms worsen in any way or with any new symptoms.    2.  UTI prophylaxis-stable.refilled macrobid.     Follow up: as above

## 2012-06-11 ENCOUNTER — Ambulatory Visit (INDEPENDENT_AMBULATORY_CARE_PROVIDER_SITE_OTHER): Payer: Medicare Other | Admitting: Family Medicine

## 2012-06-11 ENCOUNTER — Telehealth (INDEPENDENT_AMBULATORY_CARE_PROVIDER_SITE_OTHER): Payer: Self-pay | Admitting: Medical

## 2012-06-11 ENCOUNTER — Encounter (INDEPENDENT_AMBULATORY_CARE_PROVIDER_SITE_OTHER): Payer: Self-pay | Admitting: Family Medicine

## 2012-06-11 VITALS — BP 135/87 | HR 69 | Temp 98.8°F | Resp 20 | Wt 345.0 lb

## 2012-06-11 DIAGNOSIS — J069 Acute upper respiratory infection, unspecified: Secondary | ICD-10-CM

## 2012-06-11 DIAGNOSIS — R059 Cough, unspecified: Secondary | ICD-10-CM

## 2012-06-11 DIAGNOSIS — J45901 Unspecified asthma with (acute) exacerbation: Secondary | ICD-10-CM

## 2012-06-11 MED ORDER — BENZONATATE 100 MG OR CAPS
ORAL_CAPSULE | ORAL | Status: DC
Start: 2012-06-11 — End: 2012-12-15

## 2012-06-11 MED ORDER — FLUTICASONE PROPIONATE HFA 110 MCG/ACT IN AERO
INHALATION_SPRAY | RESPIRATORY_TRACT | Status: DC
Start: 2012-06-11 — End: 2013-01-18

## 2012-06-11 MED ORDER — DEXTROMETHORPHAN-GUAIFENESIN 10-100 MG/5ML OR LIQD
ORAL | Status: DC
Start: 2012-06-11 — End: 2012-12-15

## 2012-06-11 NOTE — Telephone Encounter (Signed)
Ordered guaifenesin-DM and called patient to let her know. She will pick it up tomorrow.

## 2012-06-11 NOTE — Telephone Encounter (Addendum)
Pt's rx, Benzonatate 100 MG Oral Cap, isnt covered by insurance    Alternatives? Pt did say perhaps she could get cough syrup she had before    Ph:[phone]

## 2012-06-11 NOTE — Telephone Encounter (Signed)
Dr. Andrey Campanile, Patient sates she wants to try a medication that will help her cough during the day. The Codeine Syrup is only helping at night. She has enough Codeine to get her through the night and can pick up the new prescription tomorrow. Benzonatate cost $55 and she cant afford that right now.

## 2012-06-11 NOTE — Patient Instructions (Signed)
Please come back to clinic if you are not getting better. If you are better, please call and let me know.

## 2012-06-16 NOTE — Progress Notes (Signed)
Sharon Stephens is a 36 year old female here to discuss cough.     She saw Dr. Louann Liv 2 days ago for 10 days of cough and URI symptoms and was diagnosed with bronchitis and treated with azithromycin. She comes in today because she continues to have a cough. Intermittently productive of clear, white, yellow, and green sputum. No hemoptysis. Taking guaifenesin with codeine at night, which is helpful, but does not have anything to take during the day.   Has a diagnosis of asthma, but says it started as an adult. No h/o pulmonary problems as a child.   She has been using albuterol inhaler at least 4 times daily, which is helpful.   Reports having bronchitis and/or pneumonia in the past few years that had to be treated with antibiotic and oral steroid. She thinks she needs an oral steroid now.      No fevers, chills. No sinus pressure. No abdominal pain, N/V.     Also needs another note for work. Lost the one from Dr. Louann Liv.     Objective:  BP 135/87  Pulse 69  Temp(Src) 98.8 F (37.1 C) (Temporal)  Resp 20  Wt 345 lb (156.491 kg)  BMI 57.06 kg/m2  SpO2 100%  General: well appearing, alert, relaxed, no distress  CV: RRR, no m/r/g  Pulm: normal work of breathing, good air movement, coarse breath sounds with scattered expiratory wheezes      Assessment and Plan: 36 year old female with asthma exacerbation in the setting of probable bronchitis. Do not suspect pneumonia given lack of fever and normal O2 sat. Cough is persistent and she is worried about need for a steroid. Discussed risks of systemic steroid and recommended considering an inhaled steroid to be used during this exacerbation. She will probably not need the ICS when feeling well, but may need to use it with future exacerbations.   1. Continue azithromycin and albuterol   2. Start inhaled fluticasone 2 puffs BID for 2 weeks, then discontinue  3. She requests Tessalon Perles for cough suppressant, advised that this may not be covered by her  insurance, but she would like to try - ordered  4. She will continue to use guaifenesin with codeine at night  5. New letter given for work  6. Follow up if worse or not improving      Amado Coe, MD

## 2012-07-05 ENCOUNTER — Other Ambulatory Visit (EMERGENCY_DEPARTMENT_HOSPITAL): Payer: Self-pay

## 2012-07-05 ENCOUNTER — Emergency Department
Admission: EM | Admit: 2012-07-05 | Discharge: 2012-07-05 | Discharge status: Against Medical Advice | Payer: Medicare Other | Attending: Emergency Medicine | Admitting: Emergency Medicine

## 2012-07-05 DIAGNOSIS — R079 Chest pain, unspecified: Secondary | ICD-10-CM

## 2012-07-05 LAB — CBC, DIFF
% Basophils: 0 %
% Eosinophils: 1 %
% Immature Granulocytes: 0 %
% Lymphocytes: 29 %
% Monocytes: 4 %
% Neutrophils: 66 %
Absolute Eosinophil Count: 0.1 10*3/uL (ref 0.00–0.50)
Absolute Lymphocyte Count: 2.71 10*3/uL (ref 1.00–4.80)
Basophils: 0.03 10*3/uL (ref 0.00–0.20)
Hematocrit: 40 % (ref 36–45)
Hemoglobin: 13.4 g/dL (ref 11.5–15.5)
Immature Granulocytes: 0.02 10*3/uL (ref 0.00–0.05)
MCH: 29.6 pg (ref 27.3–33.6)
MCHC: 33.8 g/dL (ref 32.2–36.5)
MCV: 88 fL (ref 81–98)
Monocytes: 0.41 10*3/uL (ref 0.00–0.80)
Neutrophils: 6.06 10*3/uL (ref 1.80–7.00)
Platelet Count: 197 10*3/uL (ref 150–400)
RBC: 4.52 mil/uL (ref 3.80–5.00)
RDW-CV: 13.6 % (ref 11.6–14.4)
WBC: 9.33 10*3/uL (ref 4.3–10.0)

## 2012-07-05 LAB — BASIC METABOLIC PANEL
Anion Gap: 7 (ref 3–11)
Calcium: 8.7 mg/dL — ABNORMAL LOW (ref 8.9–10.2)
Carbon Dioxide, Total: 24 mEq/L (ref 22–32)
Chloride: 107 mEq/L (ref 98–108)
Creatinine: 0.64 mg/dL (ref 0.38–1.02)
GFR, Calc, African American: >60 mL/min (ref >59)
GFR, Calc, European American: >60 mL/min (ref >59)
Glucose: 87 mg/dL (ref 62–125)
Potassium: 3.7 mEq/L (ref 3.7–5.2)
Sodium: 138 mEq/L (ref 136–145)
Urea Nitrogen: 14 mg/dL (ref 8–21)

## 2012-07-05 LAB — PREGNANCY (HCG), SERUM, QUANT
Pregnancy (HCG), SRM: <1 m[IU]/mL (ref <6)
Pregnancy (HCG), SRM: NEGATIVE

## 2012-07-05 LAB — 1ST EXTRA RED TOP

## 2012-07-05 LAB — 1ST EXTRA BLUE TOP

## 2012-07-05 LAB — MAGNESIUM: Magnesium: 1.8 mg/dL (ref 1.8–2.4)

## 2012-07-05 LAB — CHEST PAIN REFLEXIVE TESTING: Troponin_I: 0.03 ng/mL (ref 0.00–0.39)

## 2012-07-12 ENCOUNTER — Ambulatory Visit (HOSPITAL_BASED_OUTPATIENT_CLINIC_OR_DEPARTMENT_OTHER): Payer: Medicare Other | Admitting: Registered"

## 2012-07-12 ENCOUNTER — Ambulatory Visit: Payer: Medicare Other | Attending: Surgery | Admitting: Surgery

## 2012-07-12 DIAGNOSIS — Z9884 Bariatric surgery status: Secondary | ICD-10-CM | POA: Insufficient documentation

## 2012-07-18 ENCOUNTER — Telehealth (INDEPENDENT_AMBULATORY_CARE_PROVIDER_SITE_OTHER): Payer: Self-pay | Admitting: Family Medicine

## 2012-07-18 NOTE — Telephone Encounter (Signed)
CONFIRMED PHONE NUMBER: [phone]  CALLERS FIRST AND LAST NAME: Pershing Proud    FACILITY NAME: na TITLE: na  CALLERS RELATIONSHIP:Self  RETURN CALL: No call back needed    SUBJECT: General Message   REASON FOR REQUEST: back pain     MESSAGE: Patient declined 911 and careline. Appointment made for 2pm 07/19/12.     Thank you

## 2012-07-19 ENCOUNTER — Ambulatory Visit (INDEPENDENT_AMBULATORY_CARE_PROVIDER_SITE_OTHER): Payer: Medicare Other | Admitting: Family Medicine

## 2012-07-19 NOTE — Telephone Encounter (Signed)
Pt is scheduled for today  °

## 2012-07-21 ENCOUNTER — Telehealth (INDEPENDENT_AMBULATORY_CARE_PROVIDER_SITE_OTHER): Payer: Self-pay | Admitting: Family Medicine

## 2012-07-21 NOTE — Telephone Encounter (Signed)
CONFIRMED PHONE NUMBER: [phone]  CALLERS FIRST AND LAST NAME: Pershing Proud    FACILITY NAME: n/a TITLE: n/a  CALLERS RELATIONSHIP:Self  RETURN CALL: Detailed message on voicemail only    SUBJECT: Appointment Request   REASON FOR REQUEST: Same Day (Patient has an appointment on 07/22/12 at 10:00 AM, but said symptoms are worsening and would like to be seen today.)    REQUEST APPOINTMENT WITH: Any  REFERRING PROVIDER: n/a  REQUESTED DATE: Same Day, 07/21/12  REQUESTED TIME: Any  UNABLE TO APPOINT: Patient requesting specific time  (Patient was also advised of [facility], but prefers to go to [place] because she is in the area. She also mentioned she may just walk into clinic.)

## 2012-07-21 NOTE — Telephone Encounter (Signed)
Pt did walk in today and wondered if we had openings. She was advised to go to urgent care as we still do not have any openings today at [facility]. Pt kept her appt for tomorrow, unsure if she will go to [facility]. Routing to [employer] as Lorain Childes.

## 2012-07-21 NOTE — Telephone Encounter (Signed)
PSR's/Clinical staff: please direct this pt to [facility] if she walks in today and feels she needs seen. We do not have any appointments at [facility] today. Pt was already given an appointment for back pain/neck pain on 1/13 and pt cancelled that appt.

## 2012-07-22 ENCOUNTER — Ambulatory Visit (INDEPENDENT_AMBULATORY_CARE_PROVIDER_SITE_OTHER): Payer: Medicare Other | Admitting: Family Medicine

## 2012-07-27 ENCOUNTER — Encounter (INDEPENDENT_AMBULATORY_CARE_PROVIDER_SITE_OTHER): Payer: Self-pay | Admitting: Physician Assistant

## 2012-07-27 ENCOUNTER — Ambulatory Visit (INDEPENDENT_AMBULATORY_CARE_PROVIDER_SITE_OTHER): Payer: Medicare Other | Admitting: Physician Assistant

## 2012-07-27 DIAGNOSIS — F3289 Other specified depressive episodes: Secondary | ICD-10-CM

## 2012-07-27 DIAGNOSIS — F32A Depression, unspecified: Secondary | ICD-10-CM

## 2012-07-27 MED ORDER — FLUOXETINE HCL 20 MG OR TABS
ORAL_TABLET | ORAL | Status: DC
Start: 2012-07-27 — End: 2012-09-01

## 2012-07-27 MED ORDER — HYDROCODONE-ACETAMINOPHEN 5-500 MG OR TABS
ORAL_TABLET | ORAL | Status: AC
Start: 2012-07-27 — End: 2012-08-05

## 2012-07-27 MED ORDER — METHOCARBAMOL 500 MG OR TABS
ORAL_TABLET | ORAL | Status: AC
Start: 2012-07-27 — End: 2012-08-05
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
## 2012-08-16 ENCOUNTER — Encounter (INDEPENDENT_AMBULATORY_CARE_PROVIDER_SITE_OTHER): Payer: Self-pay | Admitting: Family Medicine

## 2012-08-16 ENCOUNTER — Ambulatory Visit (INDEPENDENT_AMBULATORY_CARE_PROVIDER_SITE_OTHER): Payer: Medicare Other | Admitting: Family Medicine

## 2012-08-16 VITALS — BP 119/72 | HR 80 | Temp 95.4°F | Resp 14 | Wt 348.2 lb

## 2012-08-16 DIAGNOSIS — K909 Intestinal malabsorption, unspecified: Secondary | ICD-10-CM

## 2012-08-16 DIAGNOSIS — E538 Deficiency of other specified B group vitamins: Secondary | ICD-10-CM

## 2012-08-16 DIAGNOSIS — M549 Dorsalgia, unspecified: Secondary | ICD-10-CM
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
## 2012-08-16 NOTE — Patient Instructions (Signed)
Clinical Reference Documents    Index Spanish version Illustration      Low Back Pain   What is low back pain?   Low back pain is pain and stiffness in the lower back. It is one of the most common reasons people miss work.   How does it occur?   Low back pain is usually caused when a ligament or muscle holding a vertebra in its proper position is strained. Vertebrae are bones that make up the spinal column through which the spinal cord passes. When these muscles or ligaments become weak or strained, the spine loses its stability, resulting in pain.   Low back pain can occur if your job involves lifting and carrying heavy objects, or if you spend a lot of time sitting or standing in one position or bending over. It can be caused by a fall or by unusually strenuous exercise. It can be brought on by the tension and stress that cause headaches in some people. It can even be brought on by violent sneezing or coughing.   People who are overweight may have low back pain because of the added stress on their back.   Back pain may occur when the muscles, joints, bones, and connective tissues of the back become inflamed as a result of an infection or an immune system problem. Arthritic disorders as well as some congenital and degenerative conditions may cause back pain.   Back pain accompanied by loss of bladder or bowel control, trouble moving your legs, or numbness or tingling in your arms or legs may indicate an injury to your spine and nerves. This requires immediate medical treatment.   What are the symptoms?   Symptoms include:    pain in the back or legs   stiffness, spasm, or limited motion  The pain may be constant or may occur only in certain positions. It may get worse when you cough, sneeze, bend, twist, or strain during a bowel movement. The pain may occur in only one spot or may spread to other areas, most commonly down the buttocks and into the back of the thigh.   A low back strain typically does not produce  pain past the knee into the calf or foot. Tingling or numbness in the calf or foot may indicate a herniated disk or pinched nerve.   Be sure to see your healthcare provider if:    You have weakness in your leg, especially if you cannot lift your foot, because this may be a sign of nerve damage.   You have new bowel or bladder problems as well as back pain, which may be a sign of severe injury to your spinal cord.   You have pain that gets worse despite treatment.  How is it diagnosed?   Your healthcare provider will review your medical history and examine you. He or she may order X-rays. In certain situations an MRI, CT scan, or bone scan may be ordered.   How is it treated?   The early stages of back pain with muscle spasms should be treated with ice packs for 20 to 30 minutes every 4 to 6 hours for the first 2 to 3 days. You may lie on a frozen gel pack, crushed ice, or a bag of frozen peas.   The following are ways to treat low back pain:    Using a heating pad or hot water bottle.   Resting in bed on a firm mattress. Often it helps to lie  on your back with your knees raised. However, some people prefer to lie on their side with their knees bent. It's best to try to stay active, so try not to rest in bed longer than 1 to 2 days.   Taking aspirin, ibuprofen, or other anti-inflammatory medicines; muscle relaxants; or other pain medicines if recommended by your healthcare provider. Adults aged 96 years and older should not take nonsteroidal anti-inflammatory medicine for more than 7 days without their healthcare provider's approval.   Having your back massaged by a trained person.   Having traction, if recommended by your provider.   Wearing a belt or corset to support your back.   Doing the exercises recommended by your provider. Your provider may also have you go to a physical therapist for rehabilitation and other treatments.   Talking with a counselor, if your back pain is related to tension caused by  emotional problems.  When the pain subsides, ask your healthcare provider about starting an exercise program such as the following:    Exercise moderately every day, using stretching and warm-up exercises suggested by your provider or physical therapist.   Exercise vigorously for about 30 minutes 3 times a week by walking, swimming, using a stationary bicycle, or doing low-impact aerobics.  Exercising regularly will not only help your back, it will also help keep you healthier overall.   How long will the effects last?   The effects of back pain last as long as the cause exists or until your body recovers from the strain, usually a day or two but sometimes weeks.   How can I take care of myself?   In addition to the treatment described above, keep in mind these suggestions:    Practice good posture. Stand with your head up, shoulders straight, chest forward, weight balanced evenly on both feet, and pelvis tucked in.   Lose weight if you are overweight   Try putting an ice pack wrapped in a towel on your back for 20 minutes, 1 to 4 times per day. Set an alarm to avoid frostbite from using the ice pack too long.   If your back muscles are stiff, try an electric heating pad on a low setting (or a hot water bottle wrapped in a towel to avoid burning yourself) for 20 to 30 minutes. Don't let the heating pad get too hot, and don't fall asleep with it. You could get a burn.   Put a pillow under your knees when you are lying down.   Sleep without a pillow under your head.  Pain is the best way to judge the pace you should set in increasing your activity and exercise. Minor discomfort, stiffness, soreness, and mild aches need not interfere with activity. However, limit your activities temporarily if:    Your symptoms return.   The pain increases when you are more active.   The pain increases within 24 hours after a new or higher level of activity.  When can I return to my normal activities?   Everyone recovers  from an injury at a different rate. Return to your activities will be determined by how soon your back recovers, not by how many days or weeks it has been since your injury has occurred. In general, the longer you have symptoms before you start treatment, the longer it will take to get better. The goal of rehabilitation is to return to your normal activities as soon as is safely possible. If you return too soon you  may worsen your injury.   It is important that you have fully recovered from your low back pain before you return to any strenuous activity. You must be able to have the same range of motion that you had before your injury. You must be able to walk and twist without pain.   What can I do to help prevent low back pain?   You can reduce the strain on your back by doing the following:    Don't push with your arms when you move a heavy object. Turn around and push backwards so the strain is taken by your legs.   Whenever you sit, sit in a straight-backed chair and hold your spine against the back of the chair.   Bend your knees and hips and keep your back straight when you lift a heavy object.   Avoid lifting heavy objects higher than your waist.   Hold packages you carry close to your body, with your arms bent.   Use a footrest for one foot when you stand or sit in one spot for a long time. This keeps your back straight.   Bend your knees when you bend over.   Sit close to the pedals when you drive and use your seat belt and a hard backrest or pillow.   Lie on your side with your knees bent when you sleep or rest. It may help to put a pillow between your knees.   Put a pillow under your knees when you sleep on your back.   Raise the foot of the bed 8 inches to discourage sleeping on your stomach unless you have other problems that require that you keep your head elevated.  To rest your back, hold each of these positions for 5 minutes or longer:    Lie on your back, bend your knees, and put  pillows under your knees.   Lie on your back on the floor with a pillow under your neck. Bend your knees to a 90-degree angle, and put your lower legs and feet on a chair.   Lie on your back, bend your knees, and bring one knee up to your chest and hold it there. Repeat with the other knee, then bring both knees to your chest. When holding your knee to your chest, grab your thigh rather than your lower leg to avoid over flexing your knee.  Developed by Thana Ates Excell Seltzer, RN, [place], and [employer].   Published by [employer].   Last modified: 2008-02-17   Last reviewed: 2007-01-11   This content is reviewed periodically and is subject to change as new health information becomes available. The information is intended to inform and educate and is not a replacement for medical evaluation, advice, diagnosis or treatment by a healthcare professional.   Sports Medicine Advisor 2009.3 Index  Sports Medicine Advisor 2009.3 Credits    2009 [employer] and/or its affiliates. All Rights Reserved.    Index Spanish version    Index Spanish version       Index Spanish version Illustration      Low Back Pain Exercises    Standing hamstring stretch: Place the heel of one leg on a stool about 15 inches high. Keep your leg straight. Lean forward, bending at the hips until you feel a mild stretch in the back of your thigh. Make sure you do not roll your shoulders and bend at the waist when doing this. You want to stretch your leg, not your lower back. Hold the  stretch for 15 to 30 seconds. Repeat with each leg 3 times.   Cat and camel: Get down on your hands and knees. Let your stomach sag, allowing your back to curve downward. Hold this position for 5 seconds. Then arch your back and hold for 5 seconds. Do 3 sets of 10.   Quadruped arm and leg raise: Get down on your hands and knees. Tighten your abdominal muscles to stiffen your spine. While keeping your abdominals tight, raise one arm and the opposite leg away from you. Hold  this position for 5 seconds. Lower your arm and leg slowly and alternate sides. Do this 10 times on each side.   Pelvic tilt: Lie on your back with your knees bent and your feet flat on the floor. Tighten your abdominal muscles and push your lower back into the floor. Hold this position for 5 seconds, then relax. Do 3 sets of 10.   Partial curl: Lie on your back with your knees bent and your feet flat on the floor. Tighten your stomach muscles. Tuck your chin to your chest. With your hands stretched out in front of you, curl your upper body forward until your shoulders clear the floor. Hold this position for 3 seconds. Don't hold your breath. It helps to breathe out as you lift your shoulders up. Relax back to the floor. Repeat 10 times. Build to 3 sets of 10. To challenge yourself, clasp your hands behind your head and keep your elbows out to the side.   Gluteal stretch: Lie on your back with both knees bent. Rest the ankle of one leg over the knee of your other leg. Grasp the thigh of the bottom leg and pull toward your chest. You will feel a stretch along the buttocks and possibly along the outside of your hip. Hold the stretch for 15 to 30 seconds. Repeat 3 times with each leg.   Extension exercise: Lie face down on the floor for 5 minutes. If this hurts too much, lie face down with a pillow under your stomach. This should relieve your leg or back pain. When you can lie on your stomach for 5 minutes without a pillow, then you can continue with the rest of this exercise.   After lying on your stomach for 5 minutes, prop yourself up on your elbows for another 5 minutes. Lie flat again for 1 minute, then press down on your hands and extend your elbows while keeping your hips flat on the floor. Hold for 1 second and lower yourself to the floor. Repeat 10 times. Do 4 sets. Rest for 2 minutes between sets. You should have no pain in your legs when you do this, but it is normal to feel pain in your lower back. Do  this several times a day.    Side Plank: Lie on your side with your legs, hips, and shoulders in a straight line. Prop yourself up onto your forearm so your elbow is directly under your shoulder. Lift your hips off the floor and balance on your forearm and the outside of your foot. Try to hold this position for 15 seconds, then slowly lower your hip to the ground. Switch sides and repeat. Work up to holding for 1 minute or longer. This exercise can be made easier by starting with your knees and hips flexed at 45-degree angles.  Written by Modesta Messing, MS, PT, and Iris Pert, PT, DHSc, OCS, for [employer]   Published by [employer].   Last modified:  2008-02-25   Last reviewed: 2007-01-11   This content is reviewed periodically and is subject to change as new health information becomes available. The information is intended to inform and educate and is not a replacement for medical evaluation, advice, diagnosis or treatment by a healthcare professional.   Sports Medicine Advisor 2009.3 Index  Sports Medicine Advisor 2009.3 Credits    2009 [employer] and/or its affiliates. All Rights Reserved.

## 2012-08-16 NOTE — Progress Notes (Signed)
Sharon Stephens is a 37 year old female here to discuss the following:    (724.5) Back pain  (primary encounter diagnosis)   07/29/12 fell down on the bus. Has been trying to "take it easy" since then. Still having low back pain.   Low back pain  No stretches or exercises      Allergies versus bronchitis  - coughing  - no fevers  - grandchild has been sick.  - just wants me to listen to lungs.       B12 shots once a month -  She is over due for this and would like to get one today.     No f/c/ns    No point tenderness. No loss of blowel or bladder control. No hx of malignancy.     OBJECTIVE:  BP 119/72  Pulse 80  Temp(Src) 95.4 F (35.2 C) (Temporal)  Resp 14  Wt 348 lb 3.2 oz (157.942 kg)  BMI 57.94 kg/m2  SpO2 99%  General: Morbidly obese. alert, no distress  Lungs: CTAB, normal resp effort. No wheezing.   Back exam:  -- Inspection: multiple fold of adipose tissue on back,  no asymmetry, no bruising, no visible atrophy.  -- ROM: limited due to body habitus.   -- Strength: Hip flexion (L2-3) 4/5, Seated Knee extension (L4) 4/5, Seated Knee flexion (L5) 4/5, Foot dorsiflexion (S1) 4/5, Heal walking (L4) 4/5.  -- Special tests: Ipsilateral supine straight leg raise unable to tolerate due to pain in LE not in back. Contralateral supine straight leg raise unable to tolerate due to other lower extremity pain. Neural slump test neg.  -- Palpation: no tenderness along the spinous processes of the C/T/L/S spine. lumbar paraspinous muscle spasm noted.  -- Neurovascular exam: Sensation intact to light touch and pinprick. Distal pulses intact      ASSESSMENT AND PLAN:    (724.5) Back pain  (primary encounter diagnosis)  Plan: patient is morbidly obese (has had bariatric surgery in the past). Although falling on the bus was the instigator for this pain, I think that this is probably chronic low back pain from obesity, body habitus and deconditioning. She needs to get active ASAP. She would greatly benefit from PT. Also  needs to continue loosing weight.   - stressed importance of activity, stretching and core work out.  - gave pt hand out on back exercises while waiting to get PT set up  - Temperature therapy and NSAIDS prn pain.    -  REFERRAL TO PHYSICAL THERAPY  - return if no improvement in 6-8 weeks  - return sooner if worsening.     B12 injection today.     Lonn Georgia, MD    Patient instructions:  - Low back exercises.

## 2012-08-24 ENCOUNTER — Ambulatory Visit (INDEPENDENT_AMBULATORY_CARE_PROVIDER_SITE_OTHER): Payer: Medicare Other | Admitting: Obstetrics & Gynecology

## 2012-08-24 ENCOUNTER — Encounter (INDEPENDENT_AMBULATORY_CARE_PROVIDER_SITE_OTHER): Payer: Self-pay | Admitting: Obstetrics & Gynecology

## 2012-08-24 VITALS — BP 148/85 | HR 85 | Temp 98.0°F | Resp 16 | Ht 64.0 in | Wt 343.6 lb

## 2012-08-24 DIAGNOSIS — M79609 Pain in unspecified limb: Secondary | ICD-10-CM

## 2012-08-24 DIAGNOSIS — M79672 Pain in left foot: Secondary | ICD-10-CM

## 2012-08-24 MED ORDER — OXYCODONE-ACETAMINOPHEN 5-325 MG OR TABS
ORAL_TABLET | ORAL | Status: DC
Start: 2012-08-24 — End: 2012-09-01

## 2012-08-24 NOTE — Patient Instructions (Signed)
Thank you for choosing [facility] at [facility] for your visit today. Please follow instructions as discussed with Dr Cathren Harsh as outlined below:    -----Please take medication(s) for foot pain as instructed    Please follow-up with your primary care provider in 7 days (if needed/if discussed), and follow up with podiatry in [place]

## 2012-08-24 NOTE — Progress Notes (Signed)
Patient is being seen today for pain in her left heal. She states that the pain started back in November and was told previously that she has arthritis. It has gotten worse since then, and states she can barely walk on her foot and she fell trying to get out of bed because of the pain.     She rates he pain an 8 out of 10 and describes it as aching and sharp.

## 2012-08-25 LAB — PR X-RAY FOOT 3+ VW LEFT

## 2012-08-26 NOTE — Progress Notes (Signed)
Sharon Stephens is a 37 year old female who presents with pain in the plantar surface of left foot.  Symptoms  have been present for 3 months. Pain is moderate-severe and sharp and aching and is reported over the plantar aspect of the foot.   Pain is increased with bearing weight mostly, but becoming more constant now.   Mechanism of injury: none that she's aware of. She has been told in the past that she has "arthritis" in that foot but ibuprofen has not been much help in controlling pain ("8/10" now). She can bear weight but very painful. She takes the bus for transportation; lives with friend and children, not working at present.    Patient Active Problem List   Diagnosis   . BENIGN HYPERTENSION   . MIGRAINE NOS INTRACTABLE   . MORBID OBESITY   . CHEST PAIN NOS   . NONSPEC ABNL PAP SMEAR CERVIX, UNSPEC   . Bariatric surgery status   . IUD (intrauterine device) in place   . Suicide attempt   . Homeless   . UTI (lower urinary tract infection)   . Eczema   . Malabsorption   . Hypokalemia   . Abdominal panniculus   . Depression     Current Outpatient Prescriptions   Medication Sig   . Albuterol Sulfate HFA (PROAIR HFA) 108 (90 BASE) MCG/ACT Inhalation Aero Soln 2 puffs inhaled every 4 hours as needed for asthma; may use every 2 hours for attacks, or preventively before exercise   . Benzonatate 100 MG Oral Cap take 1 capsule 3 times daily as needed for cough   . Cholecalciferol (VITAMIN D) 1000 UNITS Oral Tab TAKE 1 TABLET DAILY   . Dextromethorphan-Guaifenesin 10-100 MG/5ML Oral Liquid 5-37ml every 4 hours as needed for cough   . Diclofenac Sodium 50 MG Oral Tab EC Take 1 tablet (50 mg total) by mouth 2 (two) times daily as needed (pain).   . Ferrous Sulfate 325 (65 FE) MG Oral Tab Take one per day   . FLUoxetine HCl 20 MG Oral Cap Take 20 mg by mouth daily.   Marland Kitchen FLUoxetine HCl 20 MG Oral Tab one tablet daily   . Fluticasone Propionate  HFA 110 MCG/ACT Inhalation Aerosol take 2 puffs twice daily for 2 weeks   .  Guaifenesin-Codeine 100-10 MG/5ML Oral Syrup 5-10 ml at bedtime as needed for cough   . Hydrocortisone 2.5 % External Cream apply to affected areas daily as needed   . Ibuprofen 800 MG Oral Tab one tablet daily with food as needed for pain   . Levonorgestrel 20 MCG/24HR Intrauterine IUD as directed   . Nitrofurantoin Macrocrystal 50 MG Oral Cap 1 CAPSULE with intercourse for postcoital prophalaxis   . Nystatin 100000 UNIT/GM External Cream apply to affecteds skin twice daily as needed   . Oxycodone-Acetaminophen (PERCOCET) 5-325 MG Oral Tab Take 1 to 2 tablets every 4 to 6 hours as needed for pain   . SUMAtriptan Succinate (IMITREX) 50 MG Oral Tab 1 tablet at headache onset, repeat every 2 hrs. if needed, max 200mg  per 24 hrs.   . Vitamin D, Ergocalciferol, 50000 UNITS Oral Cap Take 1 capsule by mouth every 7 days     Current Facility-Administered Medications   Medication   . cyanocobalamin (Vitamin B12) 1,000 mcg/mL injection     Review of patient's allergies indicates:  Allergies   Allergen Reactions   . Amoxicillin      Unsure from childhood    .  Penicillin G    . Penicillins      Unsure from childhood        ROS:   MSK: denies recent injury to other body parts   Neuro: denies numbness or tingling in foot    EXAM:   BP 148/85  Pulse 85  Temp(Src) 98 F (36.7 C) (Temporal)  Resp 16  Ht 5\' 4"  (1.626 m)  Wt 343 lb 9.6 oz (155.856 kg)  BMI 58.95 kg/m2  SpO2 98%  General: alert, no distress, relaxed, very obese female in NAD.  Gait: antalgic  Extremity:  left foot-no visible abnormalities  over the plantar foot, foot or ankle.   Skin is intact.  There is moderate tenderness palpated over the plantar forefoot and heel but no erythema, edema or ecchymosis.  Range of Motion: Normal active ROM: flexion, extension, inversion and eversion  Anterior drawer: without laxity.  Varus stress test: without laxity.  Squeeze test: without discomfort.  Neuro: achilles tendon reflex 2+ and sensation intact to light  touch  Vascular: 2+  Knee exam is normal.    XRAY: left foot xray shows tiny osteophytes on post calcaneus and mid-foot (prelim)    ASSESSMENT/PLAN:  ---(729.5) Foot pain, left  (primary encounter diagnosis)  Probable plantar fasciitis  Plan: X-RAY FOOT 3+ VW LEFT, REFERRAL TO PODIATRY,    Oxycodone-Acetaminophen (PERCOCET) 5-325 MG         Oral Tablet  Pt declined crutches or a cane or walking boot to assist ambulation.  Recommended buy silicone heel cup and forefoot cushion to ease pain when walking.    If sxs persist, or worsen, call or RTC for further evaluation.  The patient indicates understanding of these issues and agrees with the plan.

## 2012-09-01 ENCOUNTER — Encounter (INDEPENDENT_AMBULATORY_CARE_PROVIDER_SITE_OTHER): Payer: Self-pay | Admitting: Family Practice

## 2012-09-01 ENCOUNTER — Ambulatory Visit (INDEPENDENT_AMBULATORY_CARE_PROVIDER_SITE_OTHER): Payer: Medicare Other | Admitting: Family Practice

## 2012-09-01 VITALS — BP 147/75 | HR 77 | Temp 97.7°F | Resp 16 | Wt 338.0 lb

## 2012-09-01 DIAGNOSIS — M722 Plantar fascial fibromatosis: Secondary | ICD-10-CM

## 2012-09-01 DIAGNOSIS — F32A Depression, unspecified: Secondary | ICD-10-CM

## 2012-09-01 DIAGNOSIS — F3289 Other specified depressive episodes: Secondary | ICD-10-CM

## 2012-09-01 DIAGNOSIS — M549 Dorsalgia, unspecified: Secondary | ICD-10-CM

## 2012-09-01 DIAGNOSIS — G43909 Migraine, unspecified, not intractable, without status migrainosus: Secondary | ICD-10-CM

## 2012-09-01 DIAGNOSIS — M79672 Pain in left foot: Secondary | ICD-10-CM

## 2012-09-01 DIAGNOSIS — M79609 Pain in unspecified limb: Secondary | ICD-10-CM

## 2012-09-01 MED ORDER — FLUOXETINE HCL 20 MG OR TABS
ORAL_TABLET | ORAL | Status: DC
Start: 2012-09-01 — End: 2012-12-15

## 2012-09-01 MED ORDER — IBUPROFEN 800 MG OR TABS
ORAL_TABLET | ORAL | Status: DC
Start: 2012-09-01 — End: 2012-12-15

## 2012-09-01 MED ORDER — OXYCODONE-ACETAMINOPHEN 5-325 MG OR TABS
ORAL_TABLET | ORAL | Status: DC
Start: 2012-09-01 — End: 2012-12-15

## 2012-09-01 MED ORDER — SUMATRIPTAN SUCCINATE 50 MG OR TABS
ORAL_TABLET | ORAL | Status: DC
Start: 2012-09-01 — End: 2012-12-15

## 2012-09-01 NOTE — Progress Notes (Signed)
S: Sharon Stephens is in with a CC of left foot pain for over a week. She complains of sharp, achy pain on the sole of her foot, near the base. The severity is 7-8 out of 10. Pain is worse in the AM and PM. She was seen at [facility] and xrays were negative for spur.She was given percocets and referred to podiatry. That visit is scheduled for late March.  Symptoms are aggravated by walking, and standing. Symptoms are relieved by staying off her feet. She has tried ibuprofen 1200 mg twice daily, and the percocets. This has helped some. She denies trauma, redness, swelling or bruising. No numbness, tingling or weakness noted.    Patient is unemployed currently.     Outpatient Prescriptions Prior to Visit   Medication Sig Dispense Refill   . Albuterol Sulfate HFA (PROAIR HFA) 108 (90 BASE) MCG/ACT Inhalation Aero Soln 2 puffs inhaled every 4 hours as needed for asthma; may use every 2 hours for attacks, or preventively before exercise  1 Inhaler  2   . Benzonatate 100 MG Oral Cap take 1 capsule 3 times daily as needed for cough  42 Cap  0   . Cholecalciferol (VITAMIN D) 1000 UNITS Oral Tab TAKE 1 TABLET DAILY       . Dextromethorphan-Guaifenesin 10-100 MG/5ML Oral Liquid 5-58ml every 4 hours as needed for cough  240 mL  1   . Diclofenac Sodium 50 MG Oral Tab EC Take 1 tablet (50 mg total) by mouth 2 (two) times daily as needed (pain).       . Ferrous Sulfate 325 (65 FE) MG Oral Tab Take one per day  30 Tab  0   . FLUoxetine HCl 20 MG Oral Cap Take 20 mg by mouth daily.       Marland Kitchen FLUoxetine HCl 20 MG Oral Tab one tablet daily  30 Tab  0   . Fluticasone Propionate  HFA 110 MCG/ACT Inhalation Aerosol take 2 puffs twice daily for 2 weeks  1 Inhaler  0   . Guaifenesin-Codeine 100-10 MG/5ML Oral Syrup 5-10 ml at bedtime as needed for cough  60 mL  0   . Hydrocortisone 2.5 % External Cream apply to affected areas daily as needed  20 g  1   . Ibuprofen 800 MG Oral Tab one tablet daily with food as needed for pain  90 Tab  0   .  Levonorgestrel 20 MCG/24HR Intrauterine IUD as directed       . Nitrofurantoin Macrocrystal 50 MG Oral Cap 1 CAPSULE with intercourse for postcoital prophalaxis  30 Cap  2   . Nystatin 100000 UNIT/GM External Cream apply to affecteds skin twice daily as needed  30 g  0   . Oxycodone-Acetaminophen (PERCOCET) 5-325 MG Oral Tab Take 1 to 2 tablets every 4 to 6 hours as needed for pain  15 Tab  0   . SUMAtriptan Succinate (IMITREX) 50 MG Oral Tab 1 tablet at headache onset, repeat every 2 hrs. if needed, max 200mg  per 24 hrs.  18 Tab  0   . Vitamin D, Ergocalciferol, 50000 UNITS Oral Cap Take 1 capsule by mouth every 7 days  4 Cap  1     Facility-Administered Medications Prior to Visit   Medication Dose Route Frequency Provider Last Rate Last Dose   . cyanocobalamin (Vitamin B12) 1,000 mcg/mL injection  1,000 mcg Intramuscular Q30 Days Feliciano, [name]   1,000 mcg at 08/16/12 1038     Review  of patient's allergies indicates:  Allergies   Allergen Reactions   . Amoxicillin      Unsure from childhood    . Penicillin G    . Penicillins      Unsure from childhood      O: Female in NAD. BP 147/75  Pulse 77  Temp(Src) 97.7 F (36.5 C) (Temporal)  Resp 16  Wt 338 lb (153.316 kg)  BMI 57.99 kg/m2  SpO2 98%  MSK: pulses intact and normal. No obvious redness or swelling noted. Palpation of the plantar fascia reveals moderately severe tenderness. Ankle exam was unremarkable.    A/P: Plantar fasciitis. Patient will take ibuprofen 800 mg three times daily. I also refilled her Percocet 5/325, 1-2 tablets as needed for pain.She is referred to PT and will keep the appointment with podiatry.

## 2012-09-01 NOTE — Patient Instructions (Signed)
Take 800 mg three times a day
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
## 2012-09-27 ENCOUNTER — Institutional Professional Consult (permissible substitution) (INDEPENDENT_AMBULATORY_CARE_PROVIDER_SITE_OTHER): Payer: Medicare Other | Admitting: Podiatrist

## 2012-09-28 ENCOUNTER — Telehealth (INDEPENDENT_AMBULATORY_CARE_PROVIDER_SITE_OTHER): Payer: Self-pay | Admitting: Medical

## 2012-09-28 NOTE — Telephone Encounter (Signed)
Called patient left message in regards to no show on 09-27-12. No show letter #1 sent to patient.  Apt rescheduled for n/a.

## 2012-09-28 NOTE — Progress Notes (Signed)
-------------------------------------------    Attending: Aydee Mcnew Elena Teja Costen, MD  I have reviewed the resident's note and agree with the findings, assessment and plan documented in the note..  -------------------------------------------

## 2012-10-27 NOTE — Progress Notes (Signed)
Patient was seen twice today. She was seen for back pain and I refer you to the note from later today for the details of that visit.

## 2012-10-27 NOTE — Addendum Note (Signed)
Addended by: Earnest Rosier on: 10/27/2012 10:06 PM     Modules accepted: Level of Service

## 2012-11-01 NOTE — Progress Notes (Signed)
LOS changed to global. Visit was combined with earlier visit today.

## 2012-11-01 NOTE — Addendum Note (Signed)
Addended by: TRUJANO, DALIA CID on: 11/01/2012 01:36 PM     Modules accepted: Level of Service

## 2012-11-24 ENCOUNTER — Other Ambulatory Visit (INDEPENDENT_AMBULATORY_CARE_PROVIDER_SITE_OTHER): Payer: Self-pay | Admitting: Medical

## 2012-11-24 NOTE — Telephone Encounter (Signed)
Prozac 40 mg

## 2012-11-24 NOTE — Telephone Encounter (Signed)
This encounter is incomplete.     We would need to know the pharmacy for the purpose of patient's confidentiality and HIPAA concerns.    Please route it back to 'P Refill Authorization [facility]' pool with this information.     Thank you for your help to complete this refill request,  [facility] Authorization [facility]

## 2012-12-03 ENCOUNTER — Ambulatory Visit (INDEPENDENT_AMBULATORY_CARE_PROVIDER_SITE_OTHER): Payer: Medicare Other | Admitting: Medical

## 2012-12-07 ENCOUNTER — Ambulatory Visit (INDEPENDENT_AMBULATORY_CARE_PROVIDER_SITE_OTHER): Payer: Medicare Other | Admitting: Family Medicine

## 2012-12-15 ENCOUNTER — Ambulatory Visit (INDEPENDENT_AMBULATORY_CARE_PROVIDER_SITE_OTHER): Payer: Medicare Other | Admitting: Family Medicine

## 2012-12-15 ENCOUNTER — Encounter (INDEPENDENT_AMBULATORY_CARE_PROVIDER_SITE_OTHER): Payer: Self-pay | Admitting: Family Medicine

## 2012-12-15 ENCOUNTER — Telehealth (INDEPENDENT_AMBULATORY_CARE_PROVIDER_SITE_OTHER): Payer: Self-pay | Admitting: Family Medicine

## 2012-12-15 VITALS — BP 141/73 | HR 72 | Temp 97.9°F | Resp 16 | Wt 338.0 lb

## 2012-12-15 DIAGNOSIS — M722 Plantar fascial fibromatosis: Secondary | ICD-10-CM

## 2012-12-15 DIAGNOSIS — E876 Hypokalemia: Secondary | ICD-10-CM

## 2012-12-15 DIAGNOSIS — M79609 Pain in unspecified limb: Secondary | ICD-10-CM

## 2012-12-15 DIAGNOSIS — G43919 Migraine, unspecified, intractable, without status migrainosus: Secondary | ICD-10-CM

## 2012-12-15 DIAGNOSIS — F3289 Other specified depressive episodes: Secondary | ICD-10-CM

## 2012-12-15 DIAGNOSIS — I10 Essential (primary) hypertension: Secondary | ICD-10-CM

## 2012-12-15 DIAGNOSIS — F32A Depression, unspecified: Secondary | ICD-10-CM

## 2012-12-15 DIAGNOSIS — E538 Deficiency of other specified B group vitamins: Secondary | ICD-10-CM

## 2012-12-15 DIAGNOSIS — M79672 Pain in left foot: Secondary | ICD-10-CM

## 2012-12-15 DIAGNOSIS — N390 Urinary tract infection, site not specified: Secondary | ICD-10-CM

## 2012-12-15 DIAGNOSIS — G43909 Migraine, unspecified, not intractable, without status migrainosus: Secondary | ICD-10-CM

## 2012-12-15 LAB — CBC, DIFF
% Basophils: 1 %
% Eosinophils: 2 %
% Immature Granulocytes: 0 %
% Lymphocytes: 32 %
% Monocytes: 6 %
% Neutrophils: 59 %
Absolute Eosinophil Count: 0.11 10*3/uL (ref 0.00–0.50)
Absolute Lymphocyte Count: 1.91 10*3/uL (ref 1.00–4.80)
Basophils: 0.03 10*3/uL (ref 0.00–0.20)
Hematocrit: 37 % (ref 36–45)
Hemoglobin: 12.1 g/dL (ref 11.5–15.5)
Immature Granulocytes: 0.01 10*3/uL (ref 0.00–0.05)
MCH: 29.6 pg (ref 27.3–33.6)
MCHC: 32.8 g/dL (ref 32.2–36.5)
MCV: 90 fL (ref 81–98)
Monocytes: 0.35 10*3/uL (ref 0.00–0.80)
Neutrophils: 3.54 10*3/uL (ref 1.80–7.00)
Platelet Count: 203 10*3/uL (ref 150–400)
RBC: 4.09 mil/uL (ref 3.80–5.00)
RDW-CV: 13.3 % (ref 11.6–14.4)
WBC: 5.95 10*3/uL (ref 4.3–10.0)

## 2012-12-15 LAB — COMPREHENSIVE METABOLIC PANEL
ALT (GPT): 16 U/L (ref 6–40)
AST (GOT): 16 U/L (ref 15–40)
Albumin: 3.5 g/dL (ref 3.5–5.2)
Alkaline Phosphatase (Total): 57 U/L (ref 25–100)
Anion Gap: 6 (ref 3–11)
Bilirubin (Total): 0.8 mg/dL (ref 0.2–1.3)
Calcium: 8.7 mg/dL — ABNORMAL LOW (ref 8.9–10.2)
Carbon Dioxide, Total: 26 mEq/L (ref 22–32)
Chloride: 106 mEq/L (ref 98–108)
Creatinine: 0.52 mg/dL (ref 0.38–1.02)
GFR, Calc, African American: >60 mL/min (ref >59)
GFR, Calc, European American: >60 mL/min (ref >59)
Glucose: 85 mg/dL (ref 62–125)
Potassium: 3.7 mEq/L (ref 3.7–5.2)
Protein (Total): 5.9 g/dL — ABNORMAL LOW (ref 6.0–8.2)
Sodium: 138 mEq/L (ref 136–145)
Urea Nitrogen: 12 mg/dL (ref 8–21)

## 2012-12-15 LAB — THYROID STIMULATING HORMONE: Thyroid Stimulating Hormone: 3.045 u[IU]/mL (ref 0.400–5.000)

## 2012-12-15 MED ORDER — SUMATRIPTAN SUCCINATE 50 MG OR TABS
ORAL_TABLET | ORAL | Status: DC
Start: 2012-12-15 — End: 2014-02-16

## 2012-12-15 MED ORDER — FLUOXETINE HCL 40 MG OR CAPS
ORAL_CAPSULE | ORAL | Status: DC
Start: 2012-12-15 — End: 2012-12-15

## 2012-12-15 MED ORDER — OXYCODONE-ACETAMINOPHEN 5-325 MG OR TABS
ORAL_TABLET | ORAL | Status: DC
Start: 2012-12-15 — End: 2013-01-11

## 2012-12-15 MED ORDER — FLUOXETINE HCL 40 MG OR CAPS
ORAL_CAPSULE | ORAL | Status: DC
Start: 2012-12-15 — End: 2013-12-08

## 2012-12-15 MED ORDER — IBUPROFEN 800 MG OR TABS
ORAL_TABLET | ORAL | Status: DC
Start: 2012-12-15 — End: 2013-02-07

## 2012-12-15 MED ORDER — NITROFURANTOIN MACROCRYSTAL 50 MG OR CAPS
ORAL_CAPSULE | ORAL | Status: DC
Start: 2012-12-15 — End: 2014-04-21

## 2012-12-15 NOTE — Telephone Encounter (Signed)
CONFIRMED PHONE NUMBER: [phone]  CALLERS FIRST AND LAST NAME: Pershing Proud    FACILITY NAME: na TITLE: na  CALLERS RELATIONSHIP:Self  RETURN CALL: Detailed message on voicemail only     SUBJECT: Letter/Form Request   REASON FOR REQUEST: Pt Request    WHAT IS THE LETTER/FORM FOR: Other: She needs a letter stating the time and date of her appointment sent to her Court Case Worker.  WHO SHOULD FILL IT OUT: Anyone  WHERE SHOULD IT BE SENT:Please [phone] Attn:Will Wild  DATE NEEDED BY: ASAP, She forgot to pick it up when she left her appointment

## 2012-12-15 NOTE — Telephone Encounter (Signed)
Letter written and printed in POD 3 printer.

## 2012-12-15 NOTE — Telephone Encounter (Signed)
Faxed as requested below.  Patient notified.  Mailed hard copy to Pt.  Closing TE.

## 2012-12-15 NOTE — Progress Notes (Signed)
SUBJECTIVE:  Sharon Stephens is a 37 year old female here to followup on multiple medical problems. Patient reports that she been incarcerated over the past few months. They are, her fluoxetine was increased to 40 mg. She reports her depression and anxiety are well controlled at this dose. Additionally, she will start to see [facility] for refills and therapy. She denies any SI or HI.     She reports that she last received an injection of vitamin B12 in March. She has noticed some fatigue.    In terms of her high blood pressure, patient reports that she has been taking all medications. She does not check her blood pressure at home.    She reports that she has not had any urinary tract infections recently but needs a refill of her prophylactic antibiotic.    She reports that she sustained a fall one week ago with injury to her right hip and left lower back. She still has some bruising in the area and some pain especially with activity. She would like a refill of Percocet to help with the pain. She denies any history of addiction to pain medication or problems related to this.    She reports that her headaches have been well controlled and she has not needed Imitrex for many months however she needs a refill to have on hand.    She also is having left lower molar pain did to a tooth infection. She has a [facility] to have it pulled. She was prescribed antibiotic and will fill it. She is hoping she can get pain medication to help with that. She has not tolerated codeine in the past.      ROS:  Negative for paresthesias, right leg weakness, nausea vomiting, headache  Positive for as in HPI      Problem List:  Patient Active Problem List   Diagnosis   . BENIGN HYPERTENSION   . MIGRAINE NOS INTRACTABLE   . MORBID OBESITY   . CHEST PAIN NOS   . NONSPEC ABNL PAP SMEAR CERVIX, UNSPEC   . Bariatric surgery status   . IUD (intrauterine device) in place   . Suicide  attempt   . Homeless   . UTI (lower urinary tract infection)   . Eczema   . Malabsorption   . Hypokalemia   . Abdominal panniculus   . Depression       Pertinent past medical history, med list and allergy history reviewed. No tobacco alcohol or illicit drug use.  OBJECTIVE:  BP 141/73  Pulse 72  Temp(Src) 97.9 F (36.6 C) (Temporal)  Resp 16  Wt 338 lb (153.316 kg)  BMI 57.99 kg/m2  SpO2 100%  General: well appearing, in no distress  HEENT: eyes nl, OP tenderness to palpation left lower last molar with surrounding erythema and no fluctuance, nose nl  Neck: supple  Heart: nl S1S2, no murmur  Lungs: CTA Bil   Back: normal ecchymoses on left lower back  Abdomen: soft, obese  MSK: Mild tenderness to palpation over the lateral right hip with full and nontender range of motion of the hip and 5 out of 5 strength with normal gait  Psych: Normal mood and affect     Labs:  Orders Only on 07/05/12   1ST EXTRA BLUE TOP       Result Value Range    1st Extra Blue Top Additional collection tube     1ST EXTRA  RED TOP       Result Value Range    1st Extra Red Top Additional collection tube     CBC, DIFF       Result Value Range    WBC 9.33  4.3 - 10.0 THOU/uL    RBC 4.52  3.80 - 5.00 mil/uL    Hemoglobin 13.4  11.5 - 15.5 g/dL    Hematocrit 40  36 - 45 %    MCV 88  81 - 98 fL    MCH 29.6  27.3 - 33.6 pg    MCHC 33.8  32.2 - 36.5 g/dL    Platelet Count 098  150 - 400 THOU/uL    RDW-CV 13.6  11.6 - 14.4 %    % Total Neut. 66      % Lymphocytes 29      % Monocytes 4      % Eosinophils 1      % Basophils 0      % Immature Granulocytes 0      Neutrophils 6.06  1.80 - 7.00 THOU/uL    Lymphocytes 2.71  1.00 - 4.80 THOU/uL    Monocytes 0.41  0.00 - 0.80 THOU/uL    Eosinophils 0.10  0.00 - 0.50 THOU/uL    Basophils 0.03  0.00 - 0.20 THOU/uL    Immature Granulocytes 0.02  0.00 - 0.05 THOU/uL   CHEST PAIN REFLEXIVE TESTING       Result Value Range    Troponin_I 0.03  0.00 - 0.39 ng/mL    Troponin-I Interpretation        Value: Troponin  I of 0.40 ng/ml or greater: probable      myocardial infarction.   BASIC METABOLIC PANEL       Result Value Range    Sodium 138  136 - 145 mEq/L    Potassium 3.7  3.7 - 5.2 mEq/L    Chloride 107  98 - 108 mEq/L    Carbon Dioxide, Total 24  22 - 32 mEq/L    Anion Gap 7  3 - 11    Glucose 87  62 - 125 mg/dL    Urea Nitrogen 14  8 - 21 mg/dL    Creatinine 1.19  1.47 - 1.02 mg/dL    Calcium 8.7 (*) 8.9 - 10.2 mg/dL    Gfr, Calc, European American >60  >59 mL/min    GFR, Calc, African American >60  >59 mL/min    GFR, Information        Value: Calculated GFR in mL/min/1.73 m2 by MDRD equation.      Inaccurate with changing renal function.  See      [email or web address]   MAGNESIUM       Result Value Range    Magnesium 1.8  1.8 - 2.4 mg/dL   PREGNANCY (HCG), SERUM, QUANT       Result Value Range    Pregnancy (HCG), SRM <1  <6 mIU/mL    Pregnancy (HCG), SRM NEGATIVE: = or <5 mIU/mL      Pregnancy (HCG), SRM   by [facility] [place] DxI      Pregnancy (HCG), SRM        Value: Assay calibrated against WHO 3rd International Std      75/537.         ASSESSMENT/PLAN:     1. Depression  (primary encounter diagnosis)  Plan: FLUoxetine HCl 40 MG Oral Cap  Stable. Refill fluoxetine at the current dose. Encouraged her to followup with  mental health as planned.    2. Hypertension-well controlled. Refill medications today and will check a liver panel and kidney panel.    3. Right hip pain-after recent fall. Musculoskeletal in nature with no red flags. Prescription given for Percocet. This is not to be used chronically and only for this episode.    4. tooth pain-Percocet. Followup with this as planned.    5. recurrent UTIs-none recently. Recent antibiotic use prophylactically.  6. Migraine headaches-well-controlled. He'll take Imitrex to use p.r.n.        Follow up: One month for vitamin B12 shot

## 2012-12-15 NOTE — Progress Notes (Signed)
Per orders of Dr. Louann Liv, injection of Cyanocobalamin (Vitamin B12) given IM by Denice Paradise, MA.  Patient tolerates well with no adverse reaction.

## 2012-12-15 NOTE — Patient Instructions (Signed)
I will send a letter with lab results.

## 2013-01-10 ENCOUNTER — Ambulatory Visit (HOSPITAL_BASED_OUTPATIENT_CLINIC_OR_DEPARTMENT_OTHER): Payer: Medicare Other | Admitting: Plastic and Reconstructive Surgery

## 2013-01-10 ENCOUNTER — Encounter (HOSPITAL_BASED_OUTPATIENT_CLINIC_OR_DEPARTMENT_OTHER): Payer: Self-pay | Admitting: Plastic and Reconstructive Surgery

## 2013-01-10 ENCOUNTER — Emergency Department
Admission: EM | Admit: 2013-01-10 | Discharge: 2013-01-10 | Disposition: A | Discharge status: Home / Self Care | Payer: Medicare Other | Attending: Emergency Medicine | Admitting: Emergency Medicine

## 2013-01-10 VITALS — BP 132/79 | HR 65 | Temp 97.7°F | Ht 64.75 in

## 2013-01-10 DIAGNOSIS — S93409A Sprain of unspecified ligament of unspecified ankle, initial encounter: Secondary | ICD-10-CM | POA: Insufficient documentation

## 2013-01-10 DIAGNOSIS — E65 Localized adiposity: Secondary | ICD-10-CM

## 2013-01-10 DIAGNOSIS — Y9301 Activity, walking, marching and hiking: Secondary | ICD-10-CM | POA: Insufficient documentation

## 2013-01-10 DIAGNOSIS — S99919A Unspecified injury of unspecified ankle, initial encounter: Secondary | ICD-10-CM | POA: Insufficient documentation

## 2013-01-10 DIAGNOSIS — S8990XA Unspecified injury of unspecified lower leg, initial encounter: Secondary | ICD-10-CM | POA: Insufficient documentation

## 2013-01-10 DIAGNOSIS — X500XXA Overexertion from strenuous movement or load, initial encounter: Secondary | ICD-10-CM

## 2013-01-10 DIAGNOSIS — S9000XA Contusion of unspecified ankle, initial encounter: Secondary | ICD-10-CM | POA: Insufficient documentation

## 2013-01-10 NOTE — Progress Notes (Signed)
IDENTIFICATION / CHIEF COMPLAINT  New patient consultation referred by Dr. Lelon Huh for post massive weight-loss body contouring consultation: "My belly is in my way"    HISTORY OF PRESENT ILLNESS  Sharon Stephens is a 37 year old year-old female with a history of major depression, morbid obesity, s/p bariatric surgery in 2011  who [facility] for discussion of possible panniculectomy. She complains that her pannus gets in her way, makes her lose balance, and is a hindrance to her exercising. Before her bariatric surgery, she weighed 550lbs. Now, she has been stable for the past year at around 340lbs (BMI still at around 60). She believes that her pannus is limiting her from losing any more weight.     Past Medical History   Diagnosis Date   . Depressive disorder, not elsewhere classified    . Unspecified sleep apnea    . Hernia of other specified sites of abdominal cavity without mention of obstruction or gangrene      notes having 3 in abdomen    . Allergic rhinitis, cause unspecified    . Chronic obstructive asthma, unspecified    . Type II or unspecified type diabetes mellitus without mention of complication, not stated as uncontrolled      broaderline    . Esophageal reflux    . Essential hypertension, benign        Past Surgical History   Procedure Laterality Date   . C-sections     . Gallbladder removed     . Tube tied     . Tonsillectomy one-half <age 89     . Appendectomy     . Cesarean delivery only     . Lig/trnsxj flp tube abdl/vag appr uni/bi     . Cholecystectomy     . Bariatric surgery  2011       Current Outpatient Prescriptions   Medication Sig   . Albuterol Sulfate HFA (PROAIR HFA) 108 (90 BASE) MCG/ACT Inhalation Aero Soln 2 puffs inhaled every 4 hours as needed for asthma; may use every 2 hours for attacks, or preventively before exercise   . Cholecalciferol (VITAMIN D) 1000 UNITS Oral Tab TAKE 1 TABLET DAILY   . Diclofenac Sodium 50 MG Oral Tab EC Take 1 tablet (50 mg  total) by mouth 2 (two) times daily as needed (pain).   Marland Kitchen FLUoxetine HCl 40 MG Oral Cap one capsule daily   . Fluticasone Propionate  HFA 110 MCG/ACT Inhalation Aerosol take 2 puffs twice daily for 2 weeks   . Hydrocortisone 2.5 % External Cream apply to affected areas daily as needed   . Ibuprofen 800 MG Oral Tab one tablet daily with food as needed for pain   . Levonorgestrel 20 MCG/24HR Intrauterine IUD as directed   . Multiple Vitamin (MULTI VITAMIN MENS OR) Once daily   . Nitrofurantoin Macrocrystal 50 MG Oral Cap 1 CAPSULE with intercourse for postcoital prophalaxis   . Nystatin 100000 UNIT/GM External Cream apply to affecteds skin twice daily as needed   . OxyCODONE HCl 5 MG Oral Tab Take 1 tablet(s) by mouth every 4 hours if needed to relieve pain   . Oxycodone-Acetaminophen (PERCOCET) 5-325 MG Oral Tab Take 1 to 2 tablets every 4 to 6 hours as needed for pain   . SUMAtriptan Succinate (IMITREX) 50 MG Oral Tab 1 tablet at headache onset, repeat every 2 hrs. if needed, max 200mg  per 24 hrs.   . Vitamin D, Ergocalciferol, 50000 UNITS Oral  Cap Take 1 capsule by mouth every 7 days     Current Facility-Administered Medications   Medication   . cyanocobalamin (Vitamin B12) 1,000 mcg/mL injection       Review of patient's allergies indicates:  Allergies   Allergen Reactions   . Amoxicillin      Unsure from childhood    . Codeine Stomach Cramps   . Penicillin G    . Penicillins      Unsure from childhood        Family History   Problem Relation Age of Onset   . Alcohol/Drug No Hx Of    . Diabetes Mother    . Diabetes Maternal Grandmother    . Diabetes Maternal Grandfather    . Diabetes Paternal Grandmother    . Heart Disease Maternal Grandfather    . Heart Disease Paternal Grandmother    . Hypertension Maternal Grandmother    . Hypertension Maternal Grandfather    . Hypertension Paternal Grandmother    . Hypertension Paternal Grandfather    . Lipids Maternal Grandfather    . Stroke Maternal Grandfather         History     Social History Narrative    Lives in [place] with friends and children. Moved here to "get aware from my kid's father". Denies hx of abuse. Former smoker, denies TED at current. Unemployed.        Female partner   Was incarcerated recently, reason or duration unknown.       REVIEW OF SYSTEMS  A complete review of systems was completed, identifying the following pertinent positives/negatives. The remainder of the complete ROS was otherwise negative.  Tobacco use: denies  Steroid use: denies  Radiation: denies  Blood thinners: denies    PHYSICAL EXAMINATION  VITAL SIGNS: BP 132/79  Pulse 65  Temp(Src) 97.7 F (36.5 C) (Temporal)  Ht 5' 4.75" (1.645 m)  SpO2 100%  GENERAL: Alert, morbidly obese female in no acute distress, appearing comfortable.  NEURO: Alert & oriented. No focal neurologic deficits.  PSYCH:  Mood/affect appropriate and congruent.  HEENT: Normocephalic, atraumatic. Mucous membranes moist.  LUNGS: Work of breathing normal on room air.  HEART: Extremities well-perfused.  ABD: Morbidly obese, hanging about 50% of the way down her thigh. Asymmetric, difficult to palpate hips.   EXT: Moving all extremities.   SKIN: Warm and dry.     ASSESSMENT  Sharon Stephens is a morbidly obese female who lost 200 lbs after gastric bypass, but has been at a steady weight and BMI of 59 for the past year. While she desires a panniculectomy and has good reason to wish so, the risk for serious complications such as DVTs/PEs, and wound healing complications are extremely high for someone with her current body habitus. If she were to lose another 100 lbs, surgery may be considered, but not at her current physical status. The patient was counseled on the reasons for not performing a panniculectomy, despite her disappointment and stern demand for surgery. She wants to only stay within the [facility] system since she underwent gastric bypass here, so we offered to set up another appointment with a different plastic  surgeon within the department for a second opinion, to which she agreed.

## 2013-01-11 ENCOUNTER — Encounter (INDEPENDENT_AMBULATORY_CARE_PROVIDER_SITE_OTHER): Payer: Self-pay | Admitting: Family Medicine

## 2013-01-11 ENCOUNTER — Ambulatory Visit (INDEPENDENT_AMBULATORY_CARE_PROVIDER_SITE_OTHER): Payer: Medicare Other | Admitting: Family Medicine

## 2013-01-11 VITALS — BP 120/80 | HR 80 | Temp 96.0°F | Resp 18 | Ht 64.75 in | Wt 337.5 lb

## 2013-01-11 DIAGNOSIS — M545 Low back pain, unspecified: Secondary | ICD-10-CM

## 2013-01-11 DIAGNOSIS — Z9884 Bariatric surgery status: Secondary | ICD-10-CM

## 2013-01-11 DIAGNOSIS — Z6912 Encounter for mental health services for perpetrator of spousal or partner abuse: Secondary | ICD-10-CM | POA: Insufficient documentation

## 2013-01-11 DIAGNOSIS — Z4889 Encounter for other specified surgical aftercare: Secondary | ICD-10-CM

## 2013-01-11 MED ORDER — CYANOCOBALAMIN 1000 MCG/ML IJ SOLN
1000.0000 ug | INTRAMUSCULAR | Status: AC
Start: 2013-01-11 — End: 2014-02-05
  Administered 2013-01-11 – 2013-07-11 (×2): 1000 ug via INTRAMUSCULAR
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
## 2013-01-11 NOTE — Progress Notes (Signed)
Vaccine given today without initial adverse effect. YES    Katianna Mcclenney, MA.

## 2013-01-12 ENCOUNTER — Telehealth (HOSPITAL_BASED_OUTPATIENT_CLINIC_OR_DEPARTMENT_OTHER): Payer: Self-pay

## 2013-01-12 NOTE — Telephone Encounter (Signed)
Received call from Marcille Blanco, MA in Surgical Specialties asking if SW could assist Sharon Stephens with a cab voucher. Patient had recently injured her ankle and was unable to take the bus.  Cab voucher was given to patient for transportation home.      Sharon Stephens  Social Work Oncologist Social Work and [employer]  [phone]  [phone]  [email or web address]

## 2013-01-17 NOTE — Addendum Note (Signed)
Addended by: Hosie Spangle on: 01/17/2013 02:10 PM     Modules accepted: Level of Service

## 2013-01-18 ENCOUNTER — Encounter (INDEPENDENT_AMBULATORY_CARE_PROVIDER_SITE_OTHER): Payer: Self-pay | Admitting: Family Medicine

## 2013-01-18 ENCOUNTER — Ambulatory Visit (INDEPENDENT_AMBULATORY_CARE_PROVIDER_SITE_OTHER): Payer: Medicare Other | Admitting: Family Medicine

## 2013-01-18 VITALS — BP 153/101 | HR 72 | Resp 16 | Wt 336.0 lb

## 2013-01-18 DIAGNOSIS — M79672 Pain in left foot: Secondary | ICD-10-CM

## 2013-01-18 DIAGNOSIS — M79609 Pain in unspecified limb: Secondary | ICD-10-CM

## 2013-01-18 NOTE — Patient Instructions (Signed)
For the foot tendinitis:  Try ice and heat, ibuprofen, shoe during active hours.
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
## 2013-01-18 NOTE — Addendum Note (Signed)
Addended by: Tonye Royalty on: 01/18/2013 05:36 PM     Modules accepted: Orders

## 2013-01-24 NOTE — Progress Notes (Signed)
I saw and evaluated the patient, and agree with Dr. Liu's note.   Total time of approximately 20 minutes was spent face-to-face with the patient, of which more than 50% was spent counseling and/or coordinating the patient's care as outlined in this note.

## 2013-01-25 ENCOUNTER — Encounter (INDEPENDENT_AMBULATORY_CARE_PROVIDER_SITE_OTHER): Payer: Medicare Other | Admitting: Family Medicine

## 2013-01-26 ENCOUNTER — Ambulatory Visit: Payer: Medicare Other | Admitting: Emergency Medicine

## 2013-01-26 ENCOUNTER — Encounter (INDEPENDENT_AMBULATORY_CARE_PROVIDER_SITE_OTHER): Payer: Self-pay | Admitting: Emergency Medicine

## 2013-01-26 VITALS — BP 167/92 | HR 75 | Ht 64.75 in | Wt 333.0 lb

## 2013-01-26 DIAGNOSIS — M79609 Pain in unspecified limb: Secondary | ICD-10-CM

## 2013-01-26 DIAGNOSIS — M79672 Pain in left foot: Secondary | ICD-10-CM

## 2013-01-26 MED ORDER — TRAMADOL HCL 50 MG OR TABS
ORAL_TABLET | ORAL | Status: DC
Start: 2013-01-26 — End: 2013-01-31

## 2013-01-31 ENCOUNTER — Ambulatory Visit (INDEPENDENT_AMBULATORY_CARE_PROVIDER_SITE_OTHER): Payer: Medicare Other | Admitting: Family Medicine

## 2013-01-31 ENCOUNTER — Encounter (INDEPENDENT_AMBULATORY_CARE_PROVIDER_SITE_OTHER): Payer: Self-pay | Admitting: Family Medicine

## 2013-01-31 VITALS — BP 149/90 | HR 84 | Temp 98.3°F | Resp 20 | Wt 332.0 lb

## 2013-01-31 DIAGNOSIS — G4733 Obstructive sleep apnea (adult) (pediatric): Secondary | ICD-10-CM

## 2013-01-31 DIAGNOSIS — M722 Plantar fascial fibromatosis: Secondary | ICD-10-CM

## 2013-01-31 MED ORDER — TRAMADOL HCL 50 MG OR TABS
ORAL_TABLET | ORAL | Status: DC
Start: 2013-01-31 — End: 2013-02-07

## 2013-01-31 NOTE — Patient Instructions (Addendum)
Try and get some Superfeet orthotics (available at [employer] and running stores like [employer]).  This will provide more support than just the flat insoles in your [employer]'s.  Stop taking the Ibuprofen.  NO FLIP FLOPS.
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
## 2013-02-02 ENCOUNTER — Telehealth (INDEPENDENT_AMBULATORY_CARE_PROVIDER_SITE_OTHER): Payer: Self-pay | Admitting: Family Medicine

## 2013-02-02 NOTE — Telephone Encounter (Signed)
CONFIRMED PHONE NUMBER: [phone]  CALLERS FIRST AND LAST NAME: Pershing Proud    FACILITY NAME: na TITLE: na  CALLERS RELATIONSHIP:Self  RETURN CALL: Detailed message on voicemail only     SUBJECT: Letter/Form Request   REASON FOR REQUEST: release from school    WHAT IS THE LETTER/FORM FOR: Release to school; date out 7/30 date back 8/4  WHO SHOULD FILL IT OUT: Dr. Dola Factor   WHERE SHOULD IT BE SENT: fax to case manager Will [place] [phone]  DATE NEEDED BY: asap

## 2013-02-02 NOTE — Telephone Encounter (Signed)
Faxed to below and informed pt.

## 2013-02-02 NOTE — Progress Notes (Signed)
Urgent Care Encounter    SUBJECTIVE:  Sharon Stephens is a 36 year old  female presents for left heel pain for several days, no injury, similar pain in past related to her weight, pt had gastric bypass and lost a lot, but sx continue, cannot take asa.  No surgeries..        I personally reviewed and confirmed the past medical history, past surgical history and social history in the record with the patient today.      ROS:   Constitutional: Negative    Musculoskeletal: As noted in HPI above   Skin: Negative    Neurological: Negative        OBJECTIVE:  Alert female in no acute distress  Blood pressure 167/92, pulse 75, height 5' 4.75" (1.645 m), weight 333 lb (151.048 kg), SpO2 100.00%.  PHYSICAL EXAM:  General: healthy, alert, no distress  Skin: Skin color, texture, turgor normal. No rashes or concerning lesions  MS: antalgic gait with pain to palpation of left heel and plantar surface, limited rom, no deformity.        ASSESSMENT/PLAN:  (729.5) Heel pain, left  (primary encounter diagnosis)  Plan: REFERRAL TO PHYSICAL THERAPY, DISCONTINUED:         TraMADol HCl (ULTRAM) 50 MG Oral Tab            F/U with pcp within 1 week

## 2013-02-02 NOTE — Telephone Encounter (Signed)
OK to print and fax as requested.

## 2013-02-02 NOTE — Telephone Encounter (Signed)
Letter drafted please approve.

## 2013-02-03 ENCOUNTER — Encounter (INDEPENDENT_AMBULATORY_CARE_PROVIDER_SITE_OTHER): Payer: Self-pay | Admitting: Medical

## 2013-02-03 ENCOUNTER — Encounter (INDEPENDENT_AMBULATORY_CARE_PROVIDER_SITE_OTHER): Payer: Self-pay | Admitting: Internal Medicine

## 2013-02-03 DIAGNOSIS — M722 Plantar fascial fibromatosis: Secondary | ICD-10-CM | POA: Insufficient documentation

## 2013-02-07 ENCOUNTER — Telehealth (INDEPENDENT_AMBULATORY_CARE_PROVIDER_SITE_OTHER): Payer: Self-pay | Admitting: Family Medicine

## 2013-02-07 DIAGNOSIS — M722 Plantar fascial fibromatosis: Secondary | ICD-10-CM

## 2013-02-07 MED ORDER — HYDROCODONE-ACETAMINOPHEN 5-325 MG OR TABS
ORAL_TABLET | ORAL | Status: DC
Start: 2013-02-07 — End: 2013-02-15

## 2013-02-07 NOTE — Telephone Encounter (Signed)
CONFIRMED PHONE NUMBER: [phone]  CALLERS FIRST AND LAST NAME: Pershing Proud  FACILITY NAME: na TITLE: na  CALLERS RELATIONSHIP:Self  RETURN CALL: Detailed message on voicemail only     SUBJECT: Prescription Management   REASON FOR REQUEST: Patient would like to have a refill of pain medication for her foot until she gets into see the physical therapist on Thursday. If there is something other than the Ultram that she can take that would be preferred because she was told by the pharmacy that this medication is not good to take with her prozac and that it could cause some side effects.     MEDICATION: TraMADol HCl (ULTRAM) 50 MG Oral Tab   CONCERN/QUESTION: refill/subsitution   PRESCRIBING PROVIDER: Marybelle Killings   DOSE TAKING NOW: see below   PHARMACY NAME, LOCATION, & PHONE #[facility] [address] [phone] [address] [place] [phone]

## 2013-02-07 NOTE — Telephone Encounter (Signed)
Changed to vicodin; please call in confirmation to pharmacy, as I am not there until Wed to sign script.

## 2013-02-07 NOTE — Telephone Encounter (Signed)
Routing to provider to advise.  

## 2013-02-07 NOTE — Telephone Encounter (Signed)
Called Vicodin into pharm. Pt notified.

## 2013-02-10 ENCOUNTER — Ambulatory Visit: Payer: Medicare Other | Attending: Surgery | Admitting: Rehabilitative and Restorative Service Providers"

## 2013-02-10 DIAGNOSIS — M79673 Pain in unspecified foot: Secondary | ICD-10-CM | POA: Insufficient documentation

## 2013-02-10 DIAGNOSIS — M79672 Pain in left foot: Secondary | ICD-10-CM

## 2013-02-10 DIAGNOSIS — IMO0001 Reserved for inherently not codable concepts without codable children: Secondary | ICD-10-CM

## 2013-02-10 DIAGNOSIS — M722 Plantar fascial fibromatosis: Secondary | ICD-10-CM

## 2013-02-10 NOTE — Progress Notes (Signed)
Physical Therapy Evaluation     Time in:  1145 AM          Duration:  25 minutes  Today's date:  02/10/2013  The following patient identifiers were confirmed today: name and date of birth.  Referring Provider: Daphane Shepherd     Referring provider NPI: [id number]  Diagnosis/ICD-9: Foot Pain/719.47   Onset date: 05/2013  Visit #: 1  Treatment/Therapy diagnosis: Left foot pain    **Patient was 20 minutes late for appointment    Order/Reason for referral: Evaluate and treat  Initial assessment:  Subjective: The patient is a 37 year old female presenting with left foot pain. Symptoms began in November 2013.  Pain is located mostly in her heels with radiation to the metatarsals.  When symptoms did not abate, patient consulted with Dr. Dola Factor who referred her to physical therapy for evaluation and treatment.  Patient would like to return to standing and walking without pain.    Pain: Patient describes pain as throbbing.  Pain is aggravated by activities involving standing or walking. Pain is relieved by activity avoidance, rest, and icing.  Worst level of pain in the past 24 hours: 10/10.The patient is reporting 7/10 level of pain now, on a 0-10 Numeric Pain Distress Scale.                      Barriers to learning:  none                  Preferred Learning style:  demonstration, written and verbal    Past Medical History   Diagnosis Date   . Depressive disorder, not elsewhere classified    . Unspecified sleep apnea    . Hernia of other specified sites of abdominal cavity without mention of obstruction or gangrene      notes having 3 in abdomen    . Allergic rhinitis, cause unspecified    . Chronic obstructive asthma, unspecified    . Type II or unspecified type diabetes mellitus without mention of complication, not stated as uncontrolled      broaderline    . Esophageal reflux    . Essential hypertension, benign      Past Surgical History   Procedure Laterality Date   . C-sections     . Gallbladder removed     . Tube  tied     . Tonsillectomy one-half <age 14     . Appendectomy     . Cesarean delivery only     . Lig/trnsxj flp tube abdl/vag appr uni/bi     . Cholecystectomy     . Bariatric surgery  2011       Tests/Imaging: XR recently negative for stress fx or heel spurs. (Please see Dr. Vela Prose report for details of imaging.)    Fall Risk: The patient has a high risk for falls. 4 falls in past year.     Precautions: Falls    Prior Level of Function: Before the onset of the current condition, the patient was able to participate in all functional activities without restrictions.   Level of function at start of care: Patient is below baseline for functional activities including walking and standing.  Cultural Practices that Influence Care: none    Previous interventions: None.    Objective:  Observation: Antalgic gait with decreased heel strike. No observable abnormalities at the heel. Palpation demonstrates pain 8/10 at the medial anterior area of the left heel.   Impairments:  Range of motion: Left dorsiflexion 4/20; plantarflexion 35/55        Muscle Strength: Testing stopped secondary to pain        Balance: Single leg balance on stable surface = ~3 seconds bilaterally.    Impairment goals:   1. ROM: Left dorsiflexion 20/20; plantarflexion 55/55 in 10 weeks.  2. MMT's: Be able to test MMT's without pain in 10 weeks.  3. Balance on single leg on stable surface for 30 seconds in 10 weeks.  4. Reduce pain with palpation of the left heel to 0/10 in 10 weeks.      Functional Limitations:   1. Unable to stand without pain.   2. Unable to walk without pain.   3. Patient is unable to self-manage symptoms and progress home exercise plan  independently.    Functional goals:   1. Stand without pain  in 8 weeks.      2. Walk without pain in 8 weeks.   3. Demonstrate independence with self-management of symptoms and progressing home exercise program in 8 weeks    Interventions/Treatment provided:  Physical Therapy evaluation.    Therapeutic Exercise x 15 min: Towel dorsiflexion stretch, ankle pumping, ankle alphabet, towel scrunchies, red theraband resisted ankle 4 ways, seated heel raises, seated toe raises.     Pt. was educated on proper performance of home exercises verbally and with demonstration and patient performed home exercise program. Home exercise program parallels therapeutic exercise. Patient was provided handouts. The patient was able to demonstrate the exercises after instructions. The patient was able to communicate understanding of the instructions and precautions.  The patient will perform the instructed home program 1-2 times per day.    Plan of care: Planned skilled interventions: Physical Therapy 1x/week for 10 visits with decreasing frequency as appropriate. Physical Therapy to include: therapeutic exercise including ROM, strengthening, endurance activities and flexibility. Stabilization activities. Posture and positioning instruction. Manual therapy, joint mobilization, neural mobs and soft tissue mobilization/myofascial release. Balance and proprioception activities. Movement reeducation activities of lower extremity.  Body mechanics for functional activities.  Modalities for pain, swelling, facilitation. Traction. Taping. Symptom management program. Home exercise program progression.    ASSESSMENT: Rehabilitation potential is: Fair to poor.   POTENTIAL BARRIERS TO REHABILITATION: BMI 56 kg/m(2).   These goals were discussed and the patient agrees with them and the patient actively participated in developing them.  This patient will be seen by a physical therapist or a physical therapist assistant following this plan of care.      Naaman Plummer, DPT

## 2013-02-13 ENCOUNTER — Emergency Department
Admission: EM | Admit: 2013-02-13 | Discharge: 2013-02-14 | Disposition: A | Discharge status: Home / Self Care | Payer: Medicare Other | Attending: Emergency Medicine | Admitting: Emergency Medicine

## 2013-02-13 DIAGNOSIS — M79609 Pain in unspecified limb: Secondary | ICD-10-CM

## 2013-02-15 ENCOUNTER — Ambulatory Visit (INDEPENDENT_AMBULATORY_CARE_PROVIDER_SITE_OTHER): Payer: Medicare Other | Admitting: Family Practice

## 2013-02-15 ENCOUNTER — Encounter (INDEPENDENT_AMBULATORY_CARE_PROVIDER_SITE_OTHER): Payer: Self-pay | Admitting: Family Practice

## 2013-02-15 VITALS — BP 127/85 | HR 84 | Temp 97.8°F | Wt 331.0 lb

## 2013-02-15 DIAGNOSIS — M722 Plantar fascial fibromatosis: Secondary | ICD-10-CM

## 2013-02-15 MED ORDER — HYDROCODONE-ACETAMINOPHEN 5-325 MG OR TABS
1.0000 | ORAL_TABLET | Freq: Every day | ORAL | Status: DC | PRN
Start: 2013-02-15 — End: 2013-02-22

## 2013-02-15 NOTE — Progress Notes (Signed)
-------------------------------------------    Attending: Illa Level, MD  I discussed this patient's history and physical findings with the resident, as well as the plan for this patient.   Key findings based upon this discussion:  Plantar fasciitis  Persistent reset PT   try to avoid percocets  .  -------------------------------------------
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
## 2013-02-17 ENCOUNTER — Ambulatory Visit (HOSPITAL_BASED_OUTPATIENT_CLINIC_OR_DEPARTMENT_OTHER): Payer: Medicare Other | Admitting: Rehabilitative and Restorative Service Providers"

## 2013-02-17 NOTE — Progress Notes (Signed)
[facility] PHYSICAL THERAPY DAILY NOTE    Today Date: 02/17/13    Patient no showed physical therapy session today    Sanda Klein, PTA, CSCS

## 2013-02-17 NOTE — Progress Notes (Signed)
-------------------------------------------    Attending: Illa Level, MD  I discussed this patient's history and physical findings with the resident, as well as the plan for this patient.   Key findings based upon this discussion:  Plantar fasciits   Dr Adelina Mings note has a typo in her reference of  Narcotics NOT being the tx of choice  -------------------------------------------

## 2013-02-21 ENCOUNTER — Other Ambulatory Visit (INDEPENDENT_AMBULATORY_CARE_PROVIDER_SITE_OTHER): Payer: Self-pay | Admitting: Family Medicine

## 2013-02-21 ENCOUNTER — Encounter (INDEPENDENT_AMBULATORY_CARE_PROVIDER_SITE_OTHER): Payer: Self-pay | Admitting: Family Medicine

## 2013-02-21 DIAGNOSIS — M722 Plantar fascial fibromatosis: Secondary | ICD-10-CM

## 2013-02-22 ENCOUNTER — Encounter (INDEPENDENT_AMBULATORY_CARE_PROVIDER_SITE_OTHER): Payer: Self-pay | Admitting: Family Medicine

## 2013-02-22 ENCOUNTER — Telehealth (INDEPENDENT_AMBULATORY_CARE_PROVIDER_SITE_OTHER): Payer: Self-pay | Admitting: Family Medicine

## 2013-02-22 ENCOUNTER — Ambulatory Visit (INDEPENDENT_AMBULATORY_CARE_PROVIDER_SITE_OTHER): Payer: Medicare Other | Admitting: Family Medicine

## 2013-02-22 VITALS — BP 150/84 | HR 97 | Temp 97.2°F | Resp 22 | Wt 336.0 lb

## 2013-02-22 DIAGNOSIS — M79672 Pain in left foot: Secondary | ICD-10-CM

## 2013-02-22 DIAGNOSIS — E538 Deficiency of other specified B group vitamins: Secondary | ICD-10-CM

## 2013-02-22 DIAGNOSIS — M722 Plantar fascial fibromatosis: Secondary | ICD-10-CM

## 2013-02-22 DIAGNOSIS — M79609 Pain in unspecified limb: Secondary | ICD-10-CM

## 2013-02-22 MED ORDER — HYDROCODONE-ACETAMINOPHEN 5-325 MG OR TABS
1.0000 | ORAL_TABLET | Freq: Every day | ORAL | Status: DC | PRN
Start: 2013-02-22 — End: 2013-03-08

## 2013-02-22 NOTE — Telephone Encounter (Signed)
Your patient has requested:  Drug: generic Norco 5/325  Directions: 1 QDay prn  Qty:10  Last filled: 02/16/13  Pharmacy: [facility]     If the prescription is approved please make sure the signed hard copy is faxed to the requesting pharmacy or called in.   If it is not approved please let the patient & requesting pharmacy know.

## 2013-02-22 NOTE — Telephone Encounter (Signed)
Pharmacy needs clarification on Hydrocodone Acetominophen instructions because patient said she was told she could take 2 a day but instructions say once a day.

## 2013-02-22 NOTE — Telephone Encounter (Signed)
Reason for Refusal: Refill not appropriate   Reason for Refusal Comment: discussed with patient that last narcotic refill was to bridge to PT

## 2013-02-22 NOTE — Progress Notes (Signed)
SUBJECTIVE:  37 yo woman with multiple medical problems who comes in today for evaluation of left foot pain, present since 11/13.  Presumptive diagnosis has been plantar fascitis, but it has been un responsive to tx including rest, c-cup, boot, gel inserts, PT, etc.  Has an appointment to be seen for an injection in sports med clinic in mid Sept.  Notes she has lost 234 lb after bariatric surgery, but the foot pain is the single biggest problem she is dealing with.    ROS:  No new c/o    OBJECTIVE:  Exam shows full ROM of left ankle with pain on maximal inversion. Markedly tender sole of left foot at heel.  No swelling or inflam noted.  Some tenderness is also present inferiorly over distal metatarsals.    Plain films have been negative.    ASSESSMENT/PLAN:  1) Foot pain, possibly due to plantar fascitis, but differential should include stress fx and possibly reflex sympathetic dystrophy  2) Plan MRI w/o contrast of foot  3) Keep appt for injection  4) Taking 2 vicodin tabs qd, and given enough to last until sports med clinic appt    See also related orders.    Follow up: for other medical problems with PCP    Kendal Hymen, MD

## 2013-02-22 NOTE — Telephone Encounter (Signed)
Rx sig says 1 tab daily PRN for pain. Pt is stating she's taking 2 and so she is running out soon. She states Dr. Ranell Patrick told she could take 2 a day. I called pharmacy and read the sig that Dr. Ranell Patrick wrote today and also read the note A/P Dr. Ranell Patrick wrote as well and so they have changed the sig to 1-2 tabs qd.     ASSESSMENT/PLAN:   1) Foot pain, possibly due to plantar fascitis, but differential should include stress fx and possibly reflex sympathetic dystrophy   2) Plan MRI w/o contrast of foot   3) Keep appt for injection   4) Taking 2 vicodin tabs qd, and given enough to last until sports med clinic appt    Closing TE.

## 2013-02-23 ENCOUNTER — Other Ambulatory Visit (HOSPITAL_BASED_OUTPATIENT_CLINIC_OR_DEPARTMENT_OTHER): Payer: Medicare Other

## 2013-02-24 ENCOUNTER — Telehealth (INDEPENDENT_AMBULATORY_CARE_PROVIDER_SITE_OTHER): Payer: Self-pay

## 2013-02-24 ENCOUNTER — Encounter (HOSPITAL_BASED_OUTPATIENT_CLINIC_OR_DEPARTMENT_OTHER): Payer: Medicare Other | Admitting: Rehabilitative and Restorative Service Providers"

## 2013-02-24 ENCOUNTER — Other Ambulatory Visit (HOSPITAL_BASED_OUTPATIENT_CLINIC_OR_DEPARTMENT_OTHER): Payer: Medicare Other

## 2013-02-24 NOTE — Telephone Encounter (Signed)
Sharon Stephens ([phone])           Sent: Thu February 24, 2013 4:27 PM     To: [employer]       I have only seen her once for foot pain and referred her for imaging. She needs to have her PCP write the letter. Willa Frater, MD

## 2013-02-24 NOTE — Telephone Encounter (Signed)
CONFIRMED PHONE NUMBER: [phone]  CALLERS FIRST AND LAST NAME: Pershing Proud  FACILITY NAME: na TITLE: na  CALLERS RELATIONSHIP:Self  RETURN CALL: Detailed message on voicemail only     SUBJECT: Letter/Form Request   REASON FOR REQUEST: Patient is requesting a letter from the doctor's office explaining to the courts that she will have to continue her physical therapy and treatments as directed by the doctor or her foot will not heal. She has not been able to attend her classes because of that. Please call and confirm.     WHAT IS THE LETTER/FORM FOR: Other: Court; explaining disability and directions from doctor  WHO SHOULD FILL IT OUT: Ranell Patrick or any [facility] provider patient has seen   WHERE SHOULD IT BE SENT: faxed to [phone] ATTN: Kenan Isitt  DATE NEEDED BY: ASAP

## 2013-02-25 NOTE — Telephone Encounter (Signed)
LM for pt asking her to please call back and schedule appt as per Dr. Glean Salen request for paperwork. Routing to [facility] pool to assist on Monday if she has not called to schedule by then.

## 2013-02-25 NOTE — Telephone Encounter (Signed)
Patient called to make an appointment with Tonye Royalty. She was upset that the first available appointment was not until September 8. She insisted that she needed an appointment next week or she would be sent to jail. She said the appointment is to get a letter from the provider about her foot that she can present to the court. She would like a call back as soon as possible in hopes of getting a sooner appointment with Tonye Royalty.

## 2013-02-25 NOTE — Telephone Encounter (Signed)
Pt states that she has personally spoke with Dr Louann Liv about writing this letter and about Louann Liv becoming her PCP. Pt states that she has seen Fuller Plan at [facility] (do not have record of that in chart) but wants to be seen at [facility] now. Please advise. Thanks!

## 2013-02-25 NOTE — Telephone Encounter (Signed)
LMTCB.    Dr. Louann Liv is only in clinic on Monday next week, and she is completely booked. Patient has seen Dr. Patsy Lager for her foot before, so one possiblity may be to schedule patient with Dr. Patsy Lager to POSSIBLY fill out form.    CCR: Please assist patient in scheduling appt here with Dr. Patsy Lager or with PCP at [facility].

## 2013-02-25 NOTE — Telephone Encounter (Signed)
As below. Needs visit for letter.

## 2013-02-28 NOTE — Telephone Encounter (Signed)
Patient can only make it on 8/29 Friday.  Would like to schedule with Dr. Patsy Lager at 10:45 but not able to book slot.  Please make appointment for 8/29 at 10:45 if possible and call patient.     Thanks

## 2013-02-28 NOTE — Telephone Encounter (Signed)
LMTCB.    Dr. Louann Liv is only in clinic today and she is completely booked. Patient has seen Dr. Patsy Lager for her foot before, so one possibility may be to schedule patient with Dr. Patsy Lager to POSSIBLY fill out form.    CCR: Please assist patient in scheduling appt here with Dr. Patsy Lager or with PCP at [facility].

## 2013-02-28 NOTE — Telephone Encounter (Signed)
Appt scheduled at 10:45 on 8/29 with Dr. Patsy Lager as requested by patient. Left detailed message informing of appt.    CCR: If patient calls back, please confirm.    Closing TE.

## 2013-03-01 ENCOUNTER — Telehealth (INDEPENDENT_AMBULATORY_CARE_PROVIDER_SITE_OTHER): Payer: Self-pay | Admitting: Family Medicine

## 2013-03-01 NOTE — Telephone Encounter (Signed)
CONFIRMED PHONE NUMBER:   Telephone Information:   Home Phone [phone]   Work Phone [phone]   Mobile [phone]   CALLERS FIRST AND LAST NAME: Pershing Proud  FACILITY NAME: na TITLE: na  CALLERS RELATIONSHIP:Self  RETURN CALL: Detailed message on voicemail only  SUBJECT: General Message   REASON FOR REQUEST: Patient states that she needs to be seen at this specific clinic today. Patient states only that there's foot pain and would not allow [facility] to offer appointments at any other place and time other than today at [facility] clinic.    MESSAGE: Patient is requesting a call back on possible appointment times for today that was not available to [facility] at time of call. Thank you

## 2013-03-01 NOTE — Telephone Encounter (Signed)
Sharon Stephens stopped into clinic today for a "new" letter.  She is asking that it states she has had several appts including PT/Dr ect and therefore has had irregular school attendance.

## 2013-03-01 NOTE — Telephone Encounter (Signed)
Routing to Dr. Patsy Lager for letter for pt. (needs letter regarding foot and needing to go to PT)

## 2013-03-01 NOTE — Telephone Encounter (Signed)
Letter written and signed.

## 2013-03-02 ENCOUNTER — Encounter (INDEPENDENT_AMBULATORY_CARE_PROVIDER_SITE_OTHER): Payer: Self-pay | Admitting: Medical

## 2013-03-02 NOTE — Progress Notes (Signed)
Date ROI Received: 10/22/2012    Requested by: Shellee Milo    Records released to: Issit, Kenan    Request Sent to: [facility] Medical Records    Request was sent via:Fax  Fax Number: [phone]    Which Medical Records Requested: medical records from 01/2013-06/2013, images, verbal communication    Provider: Jerene Bears MD

## 2013-03-02 NOTE — Telephone Encounter (Signed)
Given to the patient. Closing TE

## 2013-03-03 ENCOUNTER — Encounter (HOSPITAL_BASED_OUTPATIENT_CLINIC_OR_DEPARTMENT_OTHER): Payer: Medicare Other | Admitting: Rehabilitative and Restorative Service Providers"

## 2013-03-03 ENCOUNTER — Other Ambulatory Visit (HOSPITAL_BASED_OUTPATIENT_CLINIC_OR_DEPARTMENT_OTHER): Payer: Medicare Other

## 2013-03-03 ENCOUNTER — Emergency Department
Admission: EM | Admit: 2013-03-03 | Discharge: 2013-03-03 | Discharge status: Against Medical Advice | Payer: Medicare Other | Attending: Family Medicine | Admitting: Family Medicine

## 2013-03-03 DIAGNOSIS — M722 Plantar fascial fibromatosis: Secondary | ICD-10-CM

## 2013-03-03 LAB — PR MRI ANKLE  WO CONTRAST LEFT

## 2013-03-04 ENCOUNTER — Encounter (INDEPENDENT_AMBULATORY_CARE_PROVIDER_SITE_OTHER): Payer: Medicare Other | Admitting: Family Practice

## 2013-03-05 ENCOUNTER — Other Ambulatory Visit (INDEPENDENT_AMBULATORY_CARE_PROVIDER_SITE_OTHER): Payer: Self-pay | Admitting: Family Medicine

## 2013-03-08 ENCOUNTER — Ambulatory Visit (INDEPENDENT_AMBULATORY_CARE_PROVIDER_SITE_OTHER): Payer: Medicare Other | Admitting: Medical

## 2013-03-08 ENCOUNTER — Encounter (INDEPENDENT_AMBULATORY_CARE_PROVIDER_SITE_OTHER): Payer: Medicare Other | Admitting: Family Medicine

## 2013-03-08 VITALS — BP 122/80 | HR 84 | Wt 330.0 lb

## 2013-03-08 DIAGNOSIS — M79609 Pain in unspecified limb: Secondary | ICD-10-CM

## 2013-03-08 DIAGNOSIS — M79672 Pain in left foot: Secondary | ICD-10-CM

## 2013-03-08 MED ORDER — ACETAMINOPHEN-CODEINE #3 300-30 MG OR TABS
1.0000 | ORAL_TABLET | Freq: Three times a day (TID) | ORAL | Status: DC | PRN
Start: 2013-03-08 — End: 2013-04-13

## 2013-03-08 MED ORDER — HYDROCODONE-ACETAMINOPHEN 5-325 MG OR TABS
1.0000 | ORAL_TABLET | Freq: Every day | ORAL | Status: DC | PRN
Start: 2013-03-08 — End: 2013-03-08

## 2013-03-08 NOTE — Progress Notes (Signed)
Patient presents to follow up left ankle MRI. Patient would like refill of vicodin.  She dropped 4x4 on left 2nd toe Saturday.
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
## 2013-03-09 ENCOUNTER — Telehealth (INDEPENDENT_AMBULATORY_CARE_PROVIDER_SITE_OTHER): Payer: Self-pay | Admitting: Medical

## 2013-03-09 NOTE — Telephone Encounter (Signed)
Should not be filled until 9/13

## 2013-03-09 NOTE — Telephone Encounter (Signed)
CONFIRMED PHONE NUMBER: [phone] press 1 and then zero  CALLERS FIRST AND LAST NAME: [facility]  FACILITY NAME: na TITLE: na  CALLERS RELATIONSHIP:OTHER: na  RETURN CALL: General message OK     SUBJECT: General Message   REASON FOR REQUEST: calling to verify that the clinic knows that the patient got a rx for vicodin on 8/19 and now he has a rx for another controlled substance. Wants to verify that this is correct    MESSAGE: Please return call

## 2013-03-09 NOTE — Telephone Encounter (Signed)
Called pharmacy and that wanted to check with Neysa if okay to fill T3 that was Rx'd yesterday. Pharmacy states that she currently has a Rx for Norco 5/325mg  to be take 1-2 tablets daily that was written on 8/19 for 50 tablets. Pharmacy wanted to make sure that Ferdie Ping is aware of this other prescription.     Please advise if okay for pharmacy to fill T3.

## 2013-03-10 NOTE — Telephone Encounter (Signed)
Pharmacy informed medication cannot be filled until 9/13

## 2013-03-11 ENCOUNTER — Encounter (HOSPITAL_BASED_OUTPATIENT_CLINIC_OR_DEPARTMENT_OTHER): Payer: Medicare Other | Admitting: Rehabilitative and Restorative Service Providers"

## 2013-03-29 ENCOUNTER — Ambulatory Visit (HOSPITAL_BASED_OUTPATIENT_CLINIC_OR_DEPARTMENT_OTHER): Payer: Medicare Other | Attending: Physical Medicine & Rehabilitation | Admitting: Physical Medicine & Rehabilitation

## 2013-03-29 ENCOUNTER — Encounter (HOSPITAL_BASED_OUTPATIENT_CLINIC_OR_DEPARTMENT_OTHER): Payer: Self-pay | Admitting: Physical Medicine & Rehabilitation

## 2013-03-29 VITALS — BP 109/47 | HR 77 | Ht 64.0 in | Wt 340.0 lb

## 2013-03-29 DIAGNOSIS — M722 Plantar fascial fibromatosis: Secondary | ICD-10-CM | POA: Insufficient documentation

## 2013-03-29 MED ORDER — ACETAMINOPHEN-CODEINE 300-30 MG OR TABS
ORAL_TABLET | ORAL | Status: DC
Start: 2013-03-29 — End: 2013-07-11

## 2013-03-29 NOTE — Progress Notes (Signed)
[facility]    PATIENT IDENTIFICATION: This is a 37 year old woman who is sent by Merceda Elks, MD, from the [facility] for evaluation and recommendations regarding chronic left heel pain. The patient completed a health intake questionnaire which was reviewed with her. A history and physical examination were performed. Online imaging was reviewed. The patient's friend accompanied her today.     HISTORY:  The patient describes a nearly one year history of left foot pain. The pain manifest mostly along the medial calcaneus and arch. The pain can be aching and at times sharp. Her symptoms are rated as moderate to severe, between 3 and 9 on a 0 to 10 scale. Her symptoms are worse with weightbearing and she has pain especially upon first awakening in the morning.     The onset of this problem was not traumatic. She started working in a job in which she was standing for prolonged periods. She has a history of morbid obesity and had undergone bariatric surgery with a greater than 200 pound weight loss. Despite this, she was still over 300 pounds. She believes the prolonged standing is what triggered the pain.     She does not describe lower extremity radiating pain and has not had numbness or tingling.     She has undergone foot MRI. She has been treated with physical therapy but not deep tissue massage. She has had shoe inserts but not custom orthotics. She has used various pain medications. She has used a night splint.     PAST MEDICAL HISTORY:    1. Hypertension.   2. Migraine.  3. Obesity.  4. Psychologic/psychiatric illness.     REVIEW OF SYSTEMS:  As documented on the intake questionnaire, the patient endorses depression, ankle swelling, nausea. Otherwise, complete review of systems is negative.     MEDICATIONS:   1. Tylenol with codeine averaging 3 or at times 4 per day.  2. Albuterol inhaler.   3. Fluoxetine 40 mg.    ADVERSE REACTION TO MEDICATIONS:  None known.     FAMILY HISTORY:  Unknown.      SOCIAL HISTORY:  Nonsmoker. On Social Security Disability because of obesity and psychiatric disease. Currently working part time in a [employer].     PHYSICAL EXAMINATION:    General:  Alert and oriented x3.    Vital signs: BP 109/47, pulse 77 and regular, respirations 14 and unlabored, and weight 340 lb.     HEENT: Normocephalic, atraumatic. EOMs full.     Musculoskeletal:  Moderate tenderness left medial and plantar heel and arch. Passive dorsiflexion of the toes does not increase pain. No ankle or Achilles tenderness. No forefoot tenderness.     Neurologic:  No weakness. Reflexes normal. Sensation intact. Straight leg raise negative.     Vascular:  Peripheral pulses intact.     Skin:  No rashes.     REVIEW OF IMAGING:  A left ankle MRI from August 2014 reviewed. There is abnormal signal at the plantar fascia insertion.     DISCUSSION:  This is a 37 year old woman who presents with a nearly one year history of left heel and arch pain. Her signs and symptoms are consistent with plantar fasciitis. To date she has used a variety of appropriate measures through physical therapy and stretching exercises, shoe inserts, and a night splint. However, her nonsurgical care has not been maximized. Further, her current body weight is going to be a continued barrier to full improvement.  PLAN:  I have recommended custom orthotic fabrication. We discussed appropriate shoe wear in the interim including shoes with well supporting arches. We have discussed the role of injection. She felt very strongly that she wanted to undergo a steroid injection. We discussed the material risks and potential benefits of corticosteroid injection in and around the plantar fascia, including fascia rupture. We have also discussed the role of platelet rich plasma, including the fact that it is considered "experimental" by most insurance companies. I have referred her for an ultrasound-guided corticosteroid injection around the  plantar fascia. I have also discussed an alternative option of more vigorous deep tissue massage, but at this point she does not feel that she wants to commit to this.     She requested additional pain medication. I have asked her to obtain these through her primary care provider. She states that she will make an appointment, but this may take several days. I have prescribed Tylenol No. 3 #10 to be used sparingly, no more than 1 or 2 per day. I have made it very clear to the patient that I will not be taking over prescription of her opioids. She does indicate that she is scheduled to see the pain management doctors at [facility] in one or two months.     A copy of this note will be forwarded to Merceda Elks, MD.     Signature Line  Electronically Reviewed/Signed  _____________________________________  Graylin Shiver. Blaine Hamper, MD  Clinical Professor and Attending   [facility], [phone]  [place] [address]    SMW/eh

## 2013-04-04 ENCOUNTER — Encounter (INDEPENDENT_AMBULATORY_CARE_PROVIDER_SITE_OTHER): Payer: Self-pay | Admitting: Family Practice

## 2013-04-13 ENCOUNTER — Ambulatory Visit (HOSPITAL_BASED_OUTPATIENT_CLINIC_OR_DEPARTMENT_OTHER): Payer: Medicare Other | Attending: Sports Medicine | Admitting: Sports Medicine

## 2013-04-13 ENCOUNTER — Encounter (HOSPITAL_BASED_OUTPATIENT_CLINIC_OR_DEPARTMENT_OTHER): Payer: Self-pay | Admitting: Sports Medicine

## 2013-04-13 VITALS — BP 143/88 | HR 82 | Wt 337.0 lb

## 2013-04-13 DIAGNOSIS — M79672 Pain in left foot: Secondary | ICD-10-CM

## 2013-04-13 DIAGNOSIS — M722 Plantar fascial fibromatosis: Secondary | ICD-10-CM | POA: Insufficient documentation

## 2013-04-13 DIAGNOSIS — M79609 Pain in unspecified limb: Secondary | ICD-10-CM | POA: Insufficient documentation

## 2013-04-13 MED ORDER — ACETAMINOPHEN-CODEINE #3 300-30 MG OR TABS
1.0000 | ORAL_TABLET | Freq: Three times a day (TID) | ORAL | Status: DC | PRN
Start: 2013-04-13 — End: 2013-07-11

## 2013-04-13 NOTE — Progress Notes (Signed)
Procedure: Ultrasound guided left plantar fascia injection     Pre?Procedure Diagnosis: Left plantar fasciopathy    Post?Procedure Diagnosis: Left plantar fasciopathy      Imaging:   MRI L ankle and foot 02/2013:  The plantar aponeurosis is thickened and with increased intrasubstance signal within its medial insertion compatible with plantar fasciitis.     Procedural pause conducted to verify: correct patient identity, procedure to be performed and as applicable, correct side and site, correct patient position, and availability of implants, special equipment or special requirements. The use of direct ultrasound visualization of the needle (rather than a non?guided injection) was required to ensure accurate injection placement so there can be diagnostic specificity when evaluating effectiveness of the injection, and for safety purposes to minimize risk of bleeding or injury to nearby neurovascular structures.   Following denial of allergy and review of potential side effects and complications including but not necessarily limited to infection, allergic reaction, local tissue breakdown, systemic effects of corticosteroids, elevation of blood glucose, injury to soft tissue and/or nerves and seizure, the patient indicated understanding and agreed to proceed.     Technique: The procedure was carried out under sterile technique. The patient was placed positioned left side lying with the medial foot superior on the exam table. The medial ankle neurovascular structures were identified with color doppler. The region was scanned using a linear array transducer. The optimal needle approach for the procedure was determined. The region was then prepared with a ChloraPrep scrub and re?examined using the same transducer, a sterile ultrasound transducer cover, and sterile ultrasound transducer gel.  Under ultrasound guidance, the skin and subcutaneous tissues were anesthetized with 1mL of 1% lidocaine.   Next, a 25-gauge 1.5 inch  needle was advanced in the medial to lateral direction just superficial to the plantar fascia.  After visualization of the tip in the target area and following a negative aspiration, 1mL of Kenalog (40mg /mL) and 1 mL of 1% lidocaine was injected without difficulty. The needle was withdrawn.  A simple dressing was placed over the procedure site.  The patient tolerated the procedure without complication. [place] images were saved to hard drive of [place] machine.       Plan:                                                                                           1.  The patient was instructed to avoid soaking in tubs, whirlpools, swimming and/or submersing the injection site for the next 24 hours. May shower. No activities beyond normal ADLs for the next 5 days.   2.  The patient will touch base with Dr. Blaine Hamper in 2-3 weeks to discuss the results of the injection.   3.  Discussed the importance for the patient to obtain her orthotic shoe inserts.   4.  All questions were answered during this visit.

## 2013-04-14 ENCOUNTER — Encounter (INDEPENDENT_AMBULATORY_CARE_PROVIDER_SITE_OTHER): Payer: Medicare Other | Admitting: Family Medicine

## 2013-04-18 ENCOUNTER — Emergency Department
Admission: EM | Admit: 2013-04-18 | Discharge: 2013-04-18 | Disposition: A | Discharge status: Home / Self Care | Payer: Medicare Other | Attending: Registered Nurse | Admitting: Registered Nurse

## 2013-04-18 DIAGNOSIS — G8929 Other chronic pain: Secondary | ICD-10-CM | POA: Insufficient documentation

## 2013-04-18 DIAGNOSIS — R52 Pain, unspecified: Secondary | ICD-10-CM

## 2013-04-18 DIAGNOSIS — M722 Plantar fascial fibromatosis: Secondary | ICD-10-CM | POA: Insufficient documentation

## 2013-04-21 ENCOUNTER — Encounter (INDEPENDENT_AMBULATORY_CARE_PROVIDER_SITE_OTHER): Payer: Medicare Other | Admitting: Family Medicine

## 2013-04-21 NOTE — Addendum Note (Signed)
Addended by: Porfirio Mylar CID on: 04/21/2013 09:57 AM     Modules accepted: Level of Service

## 2013-04-24 ENCOUNTER — Emergency Department (HOSPITAL_BASED_OUTPATIENT_CLINIC_OR_DEPARTMENT_OTHER): Admission: EM | Admit: 2013-04-24 | Discharge: 2013-04-24 | Payer: Medicare Other

## 2013-04-25 ENCOUNTER — Telehealth (INDEPENDENT_AMBULATORY_CARE_PROVIDER_SITE_OTHER): Payer: Self-pay | Admitting: Adult Health

## 2013-04-25 ENCOUNTER — Encounter (HOSPITAL_BASED_OUTPATIENT_CLINIC_OR_DEPARTMENT_OTHER): Payer: Self-pay | Admitting: Prosthetics Case Management

## 2013-04-25 ENCOUNTER — Ambulatory Visit (INDEPENDENT_AMBULATORY_CARE_PROVIDER_SITE_OTHER): Payer: Medicare Other | Admitting: Adult Health

## 2013-04-25 ENCOUNTER — Encounter (INDEPENDENT_AMBULATORY_CARE_PROVIDER_SITE_OTHER): Payer: Self-pay | Admitting: Adult Health

## 2013-04-25 VITALS — BP 130/90 | HR 87 | Temp 98.4°F | Wt 344.0 lb

## 2013-04-25 DIAGNOSIS — N390 Urinary tract infection, site not specified: Secondary | ICD-10-CM

## 2013-04-25 DIAGNOSIS — R339 Retention of urine, unspecified: Secondary | ICD-10-CM

## 2013-04-25 DIAGNOSIS — R300 Dysuria: Secondary | ICD-10-CM

## 2013-04-25 DIAGNOSIS — Z9884 Bariatric surgery status: Secondary | ICD-10-CM

## 2013-04-25 LAB — COMPREHENSIVE METABOLIC PANEL
ALT (GPT): 13 U/L (ref 6–40)
AST (GOT): 16 U/L (ref 15–40)
Albumin: 3.6 g/dL (ref 3.5–5.2)
Alkaline Phosphatase (Total): 59 U/L (ref 25–100)
Anion Gap: 7 (ref 3–11)
Bilirubin (Total): 0.7 mg/dL (ref 0.2–1.3)
Calcium: 8.7 mg/dL — ABNORMAL LOW (ref 8.9–10.2)
Carbon Dioxide, Total: 28 mEq/L (ref 22–32)
Chloride: 104 mEq/L (ref 98–108)
Creatinine: 0.59 mg/dL (ref 0.38–1.02)
GFR, Calc, African American: >60 mL/min (ref >59)
GFR, Calc, European American: >60 mL/min (ref >59)
Glucose: 71 mg/dL (ref 62–125)
Potassium: 4.1 mEq/L (ref 3.7–5.2)
Protein (Total): 5.9 g/dL — ABNORMAL LOW (ref 6.0–8.2)
Sodium: 139 mEq/L (ref 136–145)
Urea Nitrogen: 10 mg/dL (ref 8–21)

## 2013-04-25 LAB — URINALYSIS COMPLETE, URN
Bilirubin (Qual), URN: NEGATIVE
Epith Cells_Renal/Trans,URN: NEGATIVE /HPF
Glucose Qual, URN: NEGATIVE mg/dL
Ketones, URN: NEGATIVE mg/dL
Leukocyte Esterase, URN: NEGATIVE
Nitrite, URN: NEGATIVE
Occult Blood, URN: NEGATIVE
Protein (Alb Semiquant), URN: NEGATIVE mg/dL
RBC, URN: NEGATIVE /HPF
Specific Gravity, URN: 1.025 g/mL (ref 1.002–1.027)
WBC, URN: NEGATIVE /HPF
pH, URN: 6 (ref 5.0–8.0)

## 2013-04-25 LAB — FERRITIN: Ferritin: 22 ng/mL (ref 10–180)

## 2013-04-25 MED ORDER — PHENAZOPYRIDINE HCL 200 MG OR TABS
100.0000 mg | ORAL_TABLET | Freq: Three times a day (TID) | ORAL | Status: DC | PRN
Start: 2013-04-25 — End: 2013-07-11

## 2013-04-25 MED ORDER — CIPROFLOXACIN HCL 250 MG OR TABS
250.0000 mg | ORAL_TABLET | Freq: Two times a day (BID) | ORAL | Status: DC
Start: 2013-04-25 — End: 2013-07-11

## 2013-04-25 NOTE — Telephone Encounter (Signed)
Please advise 

## 2013-04-25 NOTE — Progress Notes (Signed)
Sharon Stephens is a 37 year old female here to discuss the following:    1) dysuria   Started yesterday, feels like she has to go but can't,   had kidney stones in the past and worries that this might be the same   Has the urge to go but when she sits, doesn't produce as much   Nausea and dry heaves started yesterday   Headache   No back pain   Not painful when she urinates, just feels like she is having spasms   Doesn't have the urge to urinate     Last urinated about 4 a.m. - dark, but no blood   Not drinking much   + hesitancy   No fevers    Went to [facility] ED last night but left before she was seen, feels worse now than she did then  Wasn't nauseous last night     Last took antibiotic several months ago   Last had sex on Friday - same partner for 3 years   Not concerned about std's   Urinates every time she has sex     Normal bowel movement yesterday     Hx of heavy ibuprofen use - 800 mg three times daily for 8 months     I personally reviewed and confirmed the past medical history in the record with the patient today.     Review of Systems   Constitutional: Negative for fever and unexpected weight change.   Gastrointestinal: Positive for nausea. Negative for vomiting, abdominal pain, diarrhea, constipation and abdominal distention.   Endocrine: Negative for polydipsia.   Genitourinary: Positive for dysuria, urgency, frequency and decreased urine volume (patient is not sure, going smaller amounts more often). Negative for hematuria, flank pain, vaginal bleeding, genital sores, vaginal pain and pelvic pain.   Neurological: Negative for tremors and weakness.     OBJECTIVE:  BP 130/90  Pulse 87  Temp(Src) 98.4 F (36.9 C) (Temporal)  Wt 344 lb (156.037 kg)  BMI 59.02 kg/m2  SpO2 99%    Physical Exam   Constitutional: She is oriented to person, place, and time. No distress.   Eyes: Conjunctivae are normal. Pupils are equal, round, and reactive to light.   Cardiovascular: Normal rate and regular rhythm.       Pulmonary/Chest: Effort normal and breath sounds normal.   Abdominal: Soft. Bowel sounds are normal. She exhibits no distension and no mass. There is no hepatosplenomegaly. There is no tenderness. There is no rebound, no guarding and no CVA tenderness.   Genitourinary:   No suprapubic distention.    Musculoskeletal: Normal range of motion.   Neurological: She is alert and oriented to person, place, and time.   Skin: Skin is warm and dry. She is not diaphoretic.   Psychiatric: She has a normal mood and affect. Her behavior is normal. Judgment and thought content normal.     UA auto dip- cloudy, trace protein, uro 4.0    ASSESSMENT AND PLAN:  Concern for possible kidney injury as decreased ability to void reported by patient.   1. Urinary tract infection  2. Urinary retention  3. Dysuria  - Ciprofloxacin HCl 250 MG Oral Tab; Take 1 tablet (250 mg) by mouth every 12 hours.  Dispense: 6 tablet; Refill: 0  - Phenazopyridine HCl (PYRIDIUM) 200 MG Oral Tab; Take 0.5-1 tablets (100-200 mg) by mouth 3 times a day as needed. For urinary symptoms.  Dispense: 10 tablet; Refill: 0  - COMPREHENSIVE METABOLIC  PANEL  - URINE C&S  Patient verbalizes understanding to take pyridium for short term, follow up with urgent care or ED if unable to void, fevers, or back pain.     4. Bariatric surgery status  - VITAMIN D (25 HYDROXY)  - FERRITIN    After visit summary given to patient.     Herminio Heads, DNP, ARNP

## 2013-04-25 NOTE — Telephone Encounter (Signed)
Sharon Stephens is asking for a letter for her employer, as she missed work Sunday and Monday due to illness.  Please call patient when ready for pick up or she may need it mailed.

## 2013-04-26 ENCOUNTER — Other Ambulatory Visit (EMERGENCY_DEPARTMENT_HOSPITAL): Payer: Self-pay | Admitting: Registered Nurse

## 2013-04-26 ENCOUNTER — Emergency Department
Admission: EM | Admit: 2013-04-26 | Discharge: 2013-04-26 | Disposition: A | Discharge status: Home / Self Care | Payer: Medicare Other | Source: Ambulatory Visit | Attending: Registered Nurse | Admitting: Registered Nurse

## 2013-04-26 DIAGNOSIS — R109 Unspecified abdominal pain: Secondary | ICD-10-CM

## 2013-04-26 DIAGNOSIS — F3289 Other specified depressive episodes: Secondary | ICD-10-CM | POA: Insufficient documentation

## 2013-04-26 DIAGNOSIS — K439 Ventral hernia without obstruction or gangrene: Secondary | ICD-10-CM | POA: Insufficient documentation

## 2013-04-26 DIAGNOSIS — N200 Calculus of kidney: Secondary | ICD-10-CM

## 2013-04-26 LAB — CBC, DIFF
% Basophils: 0 %
% Eosinophils: 1 %
% Immature Granulocytes: 0 %
% Lymphocytes: 28 %
% Monocytes: 5 %
% Neutrophils: 66 %
Absolute Eosinophil Count: 0.1 10*3/uL (ref 0.00–0.50)
Absolute Lymphocyte Count: 2.48 10*3/uL (ref 1.00–4.80)
Basophils: 0.02 10*3/uL (ref 0.00–0.20)
Hematocrit: 35 % — ABNORMAL LOW (ref 36–45)
Hemoglobin: 11.6 g/dL (ref 11.5–15.5)
Immature Granulocytes: 0.01 10*3/uL (ref 0.00–0.05)
MCH: 29.3 pg (ref 27.3–33.6)
MCHC: 32.9 g/dL (ref 32.2–36.5)
MCV: 89 fL (ref 81–98)
Monocytes: 0.42 10*3/uL (ref 0.00–0.80)
Neutrophils: 5.73 10*3/uL (ref 1.80–7.00)
Platelet Count: 193 10*3/uL (ref 150–400)
RBC: 3.96 mil/uL (ref 3.80–5.00)
RDW-CV: 13.6 % (ref 11.6–14.4)
WBC: 8.76 10*3/uL (ref 4.3–10.0)

## 2013-04-26 LAB — URINALYSIS COMPLETE, URN
Bacteria, URN: NONE SEEN
Bilirubin (Qual), URN: NEGATIVE
Epith Cells_Renal/Trans,URN: NEGATIVE /HPF
Glucose Qual, URN: NEGATIVE mg/dL
Ketones, URN: NEGATIVE mg/dL
Leukocyte Esterase, URN: NEGATIVE
Nitrite, URN: NEGATIVE
Occult Blood, URN: NEGATIVE
Protein (Alb Semiquant), URN: NEGATIVE mg/dL
RBC, URN: NEGATIVE /HPF
Specific Gravity, URN: 1.019 g/mL (ref 1.002–1.027)
WBC, URN: NEGATIVE /HPF
pH, URN: 7 (ref 5.0–8.0)

## 2013-04-26 LAB — COMPREHENSIVE METABOLIC PANEL
ALT (GPT): 14 U/L (ref 6–40)
AST (GOT): 16 U/L (ref 15–40)
Albumin: 3.3 g/dL — ABNORMAL LOW (ref 3.5–5.2)
Alkaline Phosphatase (Total): 52 U/L (ref 25–100)
Anion Gap: 5 (ref 3–11)
Bilirubin (Total): 0.6 mg/dL (ref 0.2–1.3)
Calcium: 8.5 mg/dL — ABNORMAL LOW (ref 8.9–10.2)
Carbon Dioxide, Total: 25 mEq/L (ref 22–32)
Chloride: 107 mEq/L (ref 98–108)
Creatinine: 0.55 mg/dL (ref 0.38–1.02)
GFR, Calc, African American: >60 mL/min (ref >59)
GFR, Calc, European American: >60 mL/min (ref >59)
Glucose: 102 mg/dL (ref 62–125)
Potassium: 3.9 mEq/L (ref 3.7–5.2)
Protein (Total): 5.9 g/dL — ABNORMAL LOW (ref 6.0–8.2)
Sodium: 137 mEq/L (ref 136–145)
Urea Nitrogen: 13 mg/dL (ref 8–21)

## 2013-04-26 LAB — PREGNANCY TEST, URINE
Pregnancy Test, URN: NEGATIVE
Specific Gravity, URN: 1.02 g/mL (ref 1.002–1.027)

## 2013-04-26 LAB — 1ST EXTRA GOLD TOP

## 2013-04-26 LAB — 1ST EXTRA GRAY TOP

## 2013-04-26 LAB — 1ST EXTRA BLUE TOP

## 2013-04-26 NOTE — Telephone Encounter (Signed)
I spoke to patient. She requested the letter be sent to her address and i mailed it. I also gave her Emily's message

## 2013-04-26 NOTE — Telephone Encounter (Signed)
Noted. Closing TE.

## 2013-04-26 NOTE — Telephone Encounter (Signed)
Please call patient to let her know I wrote a letter- ask if she would like to pick it up or have it mailed to her employer. Letter is in the pod 2 mail file.     Please also   1) ask how she is feeling now that she started the antibiotic and pyridium. How often is she urinating. Any fevers? Back pain?     2) tell her labs results - her kidney function is normal. The urine test does show infection, waiting on the 2nd test to double check what antibiotics it responds to.

## 2013-04-27 ENCOUNTER — Telehealth (INDEPENDENT_AMBULATORY_CARE_PROVIDER_SITE_OTHER): Payer: Self-pay | Admitting: Medical

## 2013-04-27 LAB — VITAMIN D (25 HYDROXY)
Vit D (25_Hydroxy) Total: 14.7 ng/mL — ABNORMAL LOW (ref 20.1–50.0)
Vitamin D2 (25_Hydroxy): 2.7 ng/mL
Vitamin D3 (25_Hydroxy): 12 ng/mL

## 2013-04-27 LAB — URINE C/S: Colony Count: 8000

## 2013-04-27 NOTE — Telephone Encounter (Signed)
Lennox Laity RN from [facility] ER calling to coordinate care with one of our nurses regarding this patient

## 2013-04-28 NOTE — Addendum Note (Signed)
Addended by: Porfirio Mylar CID on: 04/28/2013 04:10 PM     Modules accepted: Level of Service

## 2013-04-28 NOTE — Telephone Encounter (Signed)
Late Entry:  Spoke with Woodroe Chen from [facility].  She is their ER coordinator that monitors patients with high use patterns.  States [facility] has frequented the ER 20 times in the last year. [facility] 11 times, [facility] 6 times, [facility] once, [facility] once and [facility] once.  Lennox Laity said she called and spoke with pt to see if there were any barriers to her seeking care with her PCP pt stated no.  Robin Searing that pt is seen several times her in the clinic and fairly often.  Lennox Laity asked that we help educate pt with appropriate uses of the ER and also to encourage pt not to cancel her appt with the pain clinic.  Robin Searing that we will attempt to contact patient.

## 2013-04-28 NOTE — Telephone Encounter (Signed)
Received call from patient returning the call to the RN. I called the back line, patient hung up. Please call and advise.

## 2013-04-28 NOTE — Telephone Encounter (Signed)
No answer and no vm available will cb in am

## 2013-04-28 NOTE — Telephone Encounter (Signed)
Late Entry:    Called pt to discuss:  - Sharon Stephens's concerns  - Inquire about her appt with pain clinic   - Ask who she thinks her PCP is because she has a provider from [facility] listed as her PCP  - Address ER usage concerns  - See if we need to come up with a plan to see her more frequently to address needs to see if we can keep her out of the ED    Pt did not answer so I left a message for her and advised that we would like to speak with her and that the RN staff will continue to try and contact her or she can call the clinic back and ask to speak to RN in clinic.    CCR is pt calls please transfer her to [facility] front desk so the can connect her with an Charity fundraiser in our clinic.

## 2013-04-29 ENCOUNTER — Encounter (INDEPENDENT_AMBULATORY_CARE_PROVIDER_SITE_OTHER): Payer: Self-pay | Admitting: Adult Health

## 2013-04-30 NOTE — Telephone Encounter (Signed)
Pt replied back via ecare. Pt states she is feeling better.  FYI to Herminio Heads, DNP.    Closing TE

## 2013-05-02 NOTE — Telephone Encounter (Signed)
Left message this was our final attempt to reach reach her if she calls back please ask her the below or transfer her to [facility] so an RN can speak with her.    Thanks [employer]

## 2013-05-12 ENCOUNTER — Other Ambulatory Visit: Payer: Self-pay

## 2013-05-23 ENCOUNTER — Encounter (INDEPENDENT_AMBULATORY_CARE_PROVIDER_SITE_OTHER): Payer: Medicare Other | Admitting: Family Practice

## 2013-05-24 ENCOUNTER — Encounter (HOSPITAL_BASED_OUTPATIENT_CLINIC_OR_DEPARTMENT_OTHER): Payer: Medicare Other | Admitting: Pain Management

## 2013-06-01 ENCOUNTER — Encounter (INDEPENDENT_AMBULATORY_CARE_PROVIDER_SITE_OTHER): Payer: Self-pay | Admitting: Medical

## 2013-06-07 ENCOUNTER — Emergency Department
Admission: EM | Admit: 2013-06-07 | Discharge: 2013-06-07 | Disposition: A | Discharge status: Home / Self Care | Payer: Medicare Other | Attending: Emergency Medicine | Admitting: Emergency Medicine

## 2013-06-07 DIAGNOSIS — G43909 Migraine, unspecified, not intractable, without status migrainosus: Secondary | ICD-10-CM | POA: Insufficient documentation

## 2013-06-09 ENCOUNTER — Telehealth (INDEPENDENT_AMBULATORY_CARE_PROVIDER_SITE_OTHER): Payer: Self-pay | Admitting: Family Medicine

## 2013-06-09 DIAGNOSIS — E559 Vitamin D deficiency, unspecified: Secondary | ICD-10-CM

## 2013-06-09 MED ORDER — ERGOCALCIFEROL 1.25 MG (50000 UT) OR CAPS
50000.0000 [IU] | ORAL_CAPSULE | ORAL | Status: DC
Start: 2013-06-10 — End: 2013-07-11

## 2013-06-09 NOTE — Telephone Encounter (Signed)
I called her; this had come into my box but I thought it was a mistake since she's not my patient. I realize that I had ordered this back in July and then she had it drawn when she came in for UTI visit in October.    I note that vitamin D deficiency is common in people had bypass surgeries. She has had in the past. She is going to see her bariatric surgeon 07/12/13. I recommend she start D2 50,000 units 3 times a week for a couple of months and then get rechecked. Future order placed here. I note that she will need to supplement vitamin D ongoing.

## 2013-07-11 ENCOUNTER — Ambulatory Visit (INDEPENDENT_AMBULATORY_CARE_PROVIDER_SITE_OTHER): Payer: Medicare Other | Admitting: Family Medicine

## 2013-07-11 VITALS — BP 146/84 | HR 80 | Temp 98.6°F | Resp 20 | Wt 367.0 lb

## 2013-07-11 DIAGNOSIS — M545 Low back pain, unspecified: Secondary | ICD-10-CM

## 2013-07-11 DIAGNOSIS — K909 Intestinal malabsorption, unspecified: Secondary | ICD-10-CM

## 2013-07-11 DIAGNOSIS — Z23 Encounter for immunization: Secondary | ICD-10-CM

## 2013-07-11 MED ORDER — CYCLOBENZAPRINE HCL 10 MG OR TABS
10.0000 mg | ORAL_TABLET | Freq: Three times a day (TID) | ORAL | Status: DC | PRN
Start: 2013-07-11 — End: 2013-07-13

## 2013-07-11 MED ORDER — CYANOCOBALAMIN 1000 MCG/ML IJ SOLN
1000.0000 ug | Freq: Once | INTRAMUSCULAR | Status: DC
Start: 2013-07-11 — End: 2013-07-11

## 2013-07-11 MED ORDER — CYCLOBENZAPRINE HCL 10 MG OR TABS
10.0000 mg | ORAL_TABLET | Freq: Three times a day (TID) | ORAL | Status: DC | PRN
Start: 2013-07-11 — End: 2013-07-11

## 2013-07-11 NOTE — Progress Notes (Signed)
Vaccine Screening Questions      1.  Have you had a serious reaction or an allergic reaction to a vaccine?  NO    2.  Currently have a moderate or severe illness, including fever? (Don't Ask if vaccine   ordered by provider same day)  NO    3.  Ever had a seizure or a brain or other nervous system problem syndrome associated with a vaccine? (DTaP/TDaP/DTP pertinent) NO    4.  Is patient receiving  any live vaccinations today? (Varicella-Chickenpox, MMR-Measles/Mumps/Rubella, Zoster-Shingles)  NO    If YES to any of the questions above - Do NOT give vaccine.  Consult with RN or provider in clinic.  (#4 can be YES if all Live vaccine questions are answered NO)    If NO to all questions above - Patient may receive vaccine.    5.  Do you need to receive the Flu vaccine today? YES - Additional Flu Questions  Flu Vaccine Screening Questions:    Ever had a serious allergic reaction to eggs?  NO    Ever had Guillain-Barre syndrome associated with a vaccine? NO    Less than 6 months old? NO        Vaccine information sheet(s) discussed, patient/parent/guardian verbalized understanding? YES     VIS given 07/11/2013 by Stefan Church, MA.      Vaccine given today without initial adverse effect. Josefa Half, CMA

## 2013-07-11 NOTE — Progress Notes (Signed)
-------------------------------------------    Attending: Daphane ShepherdJudith A Denton Derks, MD  I discussed this patient's history and physical findings with the resident, as well as the plan for this patient.   Key findings based upon this discussion:  F/u multiple ED visits for various pain c/o.  Current LBP, chronic.  Needs long-term care mgmt plan; advised having her get long appt with her PCP to define and negotiate that, and not to give inappropriate medications at this acute visit.  -------------------------------------------

## 2013-07-11 NOTE — Patient Instructions (Signed)
1. Continue flexeril 3x per day for next few days.  2. Keep appt with Dr. Louann LivFeliciano on 07/13/13 at 130pm.
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
## 2013-07-12 ENCOUNTER — Encounter (HOSPITAL_BASED_OUTPATIENT_CLINIC_OR_DEPARTMENT_OTHER): Payer: Medicare Other | Admitting: Registered"

## 2013-07-12 ENCOUNTER — Encounter (HOSPITAL_BASED_OUTPATIENT_CLINIC_OR_DEPARTMENT_OTHER): Payer: Self-pay | Admitting: Registered Nurse

## 2013-07-13 ENCOUNTER — Ambulatory Visit (INDEPENDENT_AMBULATORY_CARE_PROVIDER_SITE_OTHER): Payer: Medicare Other | Admitting: Family Medicine

## 2013-07-13 VITALS — BP 132/88 | HR 84 | Temp 98.0°F | Resp 20 | Wt 371.0 lb

## 2013-07-13 DIAGNOSIS — M545 Low back pain, unspecified: Secondary | ICD-10-CM

## 2013-07-13 DIAGNOSIS — J069 Acute upper respiratory infection, unspecified: Secondary | ICD-10-CM

## 2013-07-13 MED ORDER — METHOCARBAMOL 750 MG OR TABS
ORAL_TABLET | ORAL | Status: DC
Start: 2013-07-13 — End: 2013-08-24

## 2013-07-13 MED ORDER — GUAIFENESIN ER 600 MG OR TB12
600.0000 mg | EXTENDED_RELEASE_TABLET | Freq: Two times a day (BID) | ORAL | Status: DC
Start: 2013-07-13 — End: 2013-08-24

## 2013-07-13 MED ORDER — ACETAMINOPHEN 500 MG OR TABS
500.0000 mg | ORAL_TABLET | ORAL | Status: DC | PRN
Start: 2013-07-13 — End: 2015-11-01
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
## 2013-07-13 NOTE — Patient Instructions (Addendum)
Viral Respiratory Illness [Adult]  You have an Upper Respiratory Illness (URI) caused by a virus. This illness is contagious during the first few days. It is spread through the air by coughing and sneezing or by direct contact (touching the sick person and then touching your own eyes, nose or mouth). Most viral illnesses go away within 7-10 days with rest and simple home remedies. Sometimes, the illness may last for several weeks. Antibiotics will not kill a virus and are generally not prescribed for this condition.    Home Care:  1) If symptoms are severe, rest at home for the first 2-3 days. When you resume activity, don't let yourself get too tired.  2) Avoid being exposed to cigarette smoke (yours or others').  3) Tylenol (acetaminophen) or ibuprofen (Advil, Motrin) will help fever, muscle aching and headache. (Persons under 18 with fever should not take aspirin since this may cause liver damage.)  4) Your appetite may be poor, so a light diet is fine. Avoid dehydration by drinking 6-8 glasses of fluids per day (water, soft, drinks, juices, tea, soup, etc.). Extra fluids will help loosen secretions in the nose and lungs.  5) Over-the-counter cold medicines will not shorten the length of time you're sick, but they may be helpful for the following symptoms: cough (Robitussin DM); sore throat (Chloraseptic lozenges or spray); nasal and sinus congestion (Actifed, Sudafed, Chlortrimeton).  Follow Up  with your doctor or as advised if you don't improve over the next week.  Get Prompt Medical Attention  if any of the following occur:  -- Cough with lots of colored sputum (mucus) or blood in your sputum  -- Chest pain, shortness of breath, wheezing or have trouble breathing  -- Severe headache; face, neck or ear pain  -- Fever over 100.4 F (38.0 C) for more than three days  -- You can't swallow due to throat pain   2000-2013 [employer], [address], [place], [place] [id number]. All rights reserved. This  information is not intended as a substitute for professional medical care. Always follow your healthcare professional's instructions.      Make appointment at bariatric clinic as soon as possible.   Try mucinex to help with cold.   Can use honey and cough drops for cough if develops.   Can use tylenol and new muscle relaxant: robaxin for the low back pain.

## 2013-07-29 ENCOUNTER — Encounter (INDEPENDENT_AMBULATORY_CARE_PROVIDER_SITE_OTHER): Payer: Self-pay | Admitting: Adult Health

## 2013-08-16 ENCOUNTER — Telehealth (INDEPENDENT_AMBULATORY_CARE_PROVIDER_SITE_OTHER): Payer: Self-pay | Admitting: Family Medicine

## 2013-08-16 NOTE — Telephone Encounter (Signed)
CONFIRMED PHONE NUMBER:   Telephone Information:   Home Phone [phone]   Work Phone [phone]   Mobile [phone]       CALLERS FIRST AND LAST NAME: [name]    FACILITY NAME: na TITLE: na  CALLERS RELATIONSHIP:Self  RETURN CALL: Detailed message on voicemail only     SUBJECT: General Message   REASON FOR REQUEST: Sooner appointment    MESSAGE: Patient requesting appointment with any [facility] provider tomorrow, 02-11 between 12N and 2:30PM to coordinate with her uncle's appointments .  No appointments available.  Pleasecall

## 2013-08-16 NOTE — Telephone Encounter (Signed)
LMTCB

## 2013-08-16 NOTE — Telephone Encounter (Signed)
Called patient who said she has plantar fasciitis and got disconnected. Called again and got message machine.

## 2013-08-16 NOTE — Telephone Encounter (Signed)
Patient returning call.  Please try again .  °

## 2013-08-17 NOTE — Telephone Encounter (Signed)
LMTCB.    CCR: Please assist in scheduling appt for patient. Transfer to FD if needed.

## 2013-08-20 NOTE — Telephone Encounter (Signed)
Patient already has appt to see specialist. Does not need appt with Dr. Louann LivFeliciano.    Closing TE.

## 2013-08-23 ENCOUNTER — Encounter (INDEPENDENT_AMBULATORY_CARE_PROVIDER_SITE_OTHER): Payer: Self-pay | Admitting: Family Medicine

## 2013-08-23 ENCOUNTER — Ambulatory Visit (INDEPENDENT_AMBULATORY_CARE_PROVIDER_SITE_OTHER): Payer: Medicare Other | Admitting: Family Medicine

## 2013-08-23 VITALS — BP 152/111 | HR 69 | Temp 98.4°F | Resp 16 | Wt 358.0 lb

## 2013-08-23 DIAGNOSIS — K909 Intestinal malabsorption, unspecified: Secondary | ICD-10-CM

## 2013-08-23 DIAGNOSIS — R21 Rash and other nonspecific skin eruption: Secondary | ICD-10-CM

## 2013-08-23 DIAGNOSIS — M79671 Pain in right foot: Secondary | ICD-10-CM

## 2013-08-23 DIAGNOSIS — M79609 Pain in unspecified limb: Secondary | ICD-10-CM

## 2013-08-23 DIAGNOSIS — M722 Plantar fascial fibromatosis: Secondary | ICD-10-CM

## 2013-08-23 MED ORDER — TRIAMCINOLONE ACETONIDE 0.1 % EX CREA
TOPICAL_CREAM | CUTANEOUS | Status: DC
Start: 2013-08-23 — End: 2014-02-16

## 2013-08-23 MED ORDER — CYANOCOBALAMIN 1000 MCG/ML IJ SOLN
1000.0000 ug | INTRAMUSCULAR | Status: DC
Start: 2013-08-23 — End: 2014-02-16
  Administered 2013-08-23: 1000 ug via INTRAMUSCULAR

## 2013-08-23 NOTE — Patient Instructions (Signed)
What Is Plantar Fasciitis?   The plantar fascia is a ligament-like band running from your heel to the ball of your foot. This band pulls on the heel bone, raising the arch of your foot as it pushes off the ground. But if your foot moves incorrectly, the plantar fascia may become strained. The fascia may swell and its tiny fibers may begin to fray, causing plantar fasciitis.  Causes  Plantar fasciitis is often caused by poor foot mechanics. If your foot flattens too much, the fascia may overstretch and swell. If your foot flattens too little, the fascia may ache from being pulled too tight.     Foot flattens too much        Foot flattens too little   Symptoms  With plantar fasciitis, the bottom of your foot may hurt when you stand, especially first thing in the morning. Pain usually occurs on the inside of the foot, near the spot where your heel and arch meet. Pain may lessen after a few steps, but it comes back after rest or with prolonged movement.  Related Problems  A heel spur is extra bone that may grow near the spot where the plantar fascia attaches to the heel. The heel spur may form in response to the plantar fascia's tug on the heel bone.  Bursitis is the swelling of a bursa, a fluid-filled sac that reduces friction between a ligament and a bone. Bursitis may develop if a swollen plantar fascia presses against a plantar bursa.   2000-2013 [employer], [address], [place] [address]. All rights reserved. This information is not intended as a substitute for professional medical care. Always follow your healthcare professional's instructions.

## 2013-08-23 NOTE — Progress Notes (Addendum)
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 She has pain standing and cant sit on the job,  Prior hydrocodone for the pain and she asked for both a steroid shot (had one in the past)       2)  Rt foot pain without known trauma. She is overwt and could have a stress fracture   She has pain over the rt 5th MT and when walking it hurts more       3) red rash on perioral and on the neck she has had it for a while.   uses noxema, gold bond eczema creme doesn't help   4) HTN --drinks 6 energy drinks a day      Review of patient's allergies indicates:  Allergies   Allergen Reactions   . Amoxicillin      Unsure from childhood    . Codeine Stomach Cramps   . Penicillin G    . Penicillins      Unsure from childhood        Current Outpatient Prescriptions   Medication Sig Dispense Refill   . Acetaminophen 500 MG Oral Tab Take 1 tablet (500 mg) by mouth every 4 hours as needed for pain.  60 tablet  2   . Cholecalciferol (VITAMIN D) 1000 UNITS Oral Tab TAKE 1 TABLET DAILY       . FLUoxetine HCl 40 MG Oral Cap one capsule daily  30 Cap  3   . GuaiFENesin ER (MUCINEX) 600 MG Oral TABLET SR 12 HR Take 1 tablet (600 mg) by mouth every 12 hours.  60 tablet  0   . Hydrocortisone 2.5 % External Cream apply to affected areas daily as needed  20 g  1   . Levonorgestrel 20 MCG/24HR Intrauterine IUD as directed       . Methocarbamol 750 MG Oral Tab Take one tablet four times a day as needed for muscle spasm.  60 tablet  1   . Multiple Vitamin (MULTI VITAMIN MENS OR) Once daily       . Nitrofurantoin Macrocrystal 50 MG Oral Cap 1 CAPSULE with intercourse for postcoital prophalaxis  30 Cap  2   . SUMAtriptan Succinate (IMITREX) 50 MG Oral Tab 1 tablet at headache onset, repeat every 2 hrs. if needed, max 200mg  per 24 hrs.  18 Tab  0     Current Facility-Administered Medications   Medication Dose Route Frequency  Provider Last Rate Last Dose   . cyanocobalamin (Vitamin B12) 1,000 mcg/mL injection  1,000 mcg Intramuscular Q30 Days Suzette BattiestOsborn, Adriane Guglielmo Eugene       . cyanocobalamin (Vitamin B12) 1,000 mcg/mL injection  1,000 mcg Intramuscular Q30 Days Heinen, Corinne S   1,000 mcg at 07/11/13 1154      History   Substance Use Topics   . Smoking status: Former Games developermoker   . Smokeless tobacco: Never Used   . Alcohol Use: No      ROS no melena   No shortness of breath   No chest pain   States she cant take NSAIDS due to the bariatric surgery   Objective   BP 152/111  Pulse 69  Temp(Src) 98.4 F (36.9 C) (Temporal)  Resp 16  Wt 358 lb (162.388 kg)  BMI 61.42 kg/m2  SpO2 97%   Skin:well perfused ,dry plaques on face and neck mild  scale   No oral lesions   no ecchymoses  Heent:  No thyromegaly  Chest:CTA, No wheezing.  COR: tachy RRR no m,r,g.  No JVD or edema  Abd: +BS, soft NT no organomegaly   Mild L plantar tenderness   Point tenderness R 5th MT     XRAY of rt foot no fractures seen   A/P:  1. (728.71) Plantar fasciitis of left foot  (primary encounter diagnosis)  Plan:advised I would not inject steroid for this level of plantar fasciitis rather try  REFERRAL TO PHYSICAL THERAPYand night splints   Discussed how steroids can decrease the fat pads  I also declined to fil any opiates for this and encouraged her to use her arch supports             2. 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 (729.5) Right foot pain  Plan: X-RAY FOOT 3+ VW RIGHT, REFERRAL TO PHYSICAL         THERAPY      No fracture seen . Wear a hard soled shoe     4. (782.1) Rash of face most consistent with eczema. Doubt lupus, pitaryasis or tinea   No oral lesions  Plan: Triamcinolone Acetonide 0.1 % External Cream

## 2013-08-24 NOTE — Addendum Note (Signed)
Addended by: Suzette BattiestSBORN, Hari Casaus EUGENE on: 08/24/2013 06:50 PM     Modules accepted: Orders, Medications

## 2013-09-02 ENCOUNTER — Ambulatory Visit (INDEPENDENT_AMBULATORY_CARE_PROVIDER_SITE_OTHER): Payer: Medicare Other | Admitting: Family Medicine

## 2013-09-02 VITALS — BP 131/90 | HR 84 | Temp 98.2°F | Resp 15 | Wt 357.0 lb

## 2013-09-02 DIAGNOSIS — M722 Plantar fascial fibromatosis: Secondary | ICD-10-CM

## 2013-09-02 NOTE — Patient Instructions (Signed)
Plantar Fasciitis  The "plantar fascia" is a thick fibrous layer of tissue that covers the bones on the bottom of your foot. It supports the foot bones in an arched position. "Plantar fasciitis" is a painful inflammation of the plantar fascia. This can develop gradually or suddenly. It usually affects one foot at a time. Heel pain can be sharp and feel like a knife sticking in the bottom of your foot. Pain may occur after exercising, long distance jogging, stair climbing, long periods of standing, or after getting up from a seated position.  Risk factors include arthritis, diabetes, obesity or recent weight gain, flat-foot, high arch, wearing high heels or loose shoes or shoes with a poor arch support.  Foot pain from this condition is usually worse in the morning and improves with walking. By the end of the day there may be a dull aching. Treatment requires short-term rest and controlling inflammation. It may take up to nine months before all symptoms go away with the measures described below. Rarely, a steroid injection into the foot or surgery may be needed.  Home Care  1. If you are overweight, lose weight to promote healing.  2. Choose supportive shoes with good arch support and shock absorbency. Replace athletic shoes when they become worn out. Don't walk or run barefoot.  3. Shoe inserts are an important part of treatment. These will provide optimal arch support. While you can buy off-the-shelf shoe inserts inexpensively, the best ones are those made for you by a podiatrist (foot specialist).  4. Night splints (provided by a podiatrist) keep the heel stretched out while you sleep and prevent morning pain.  5. Avoid activities that stress the feet: jogging, prolonged standing or walking, contact sports, etc.  6. First thing in the morning and before sports, stretch the bottom of your feet. Gently flex your ankle so the foot moves toward your knee.  7. Icing may help control heel pain. Apply an ice pack (ice  cubes in a plastic bag, wrapped in a towel) to the heel for 10-20 minutes as a preventive or after an acute flare of symptoms. You may repeat this every 1-2 hours as needed.  8. You may use acetaminophen (Tylenol) or ibuprofen (Motrin, Advil) to control pain, unless another medicine was prescribed. [NOTE: If you have chronic liver or kidney disease or ever had a stomach ulcer or GI bleeding, talk with your doctor before using these medicines.]  Follow Up  with your doctor or a podiatrist (foot specialist) as advised by our staff. Call for an appointment if pain worsens or there is no relief after a few weeks of home treatment. Shoe inserts, a night splint or a special boot may be required.  [NOTE: If x-rays were taken, they will be reviewed by a radiologist. You will be notified of any new findings that may affect your care.]  Return Promptly  or contact your physician if any of the following occurs:   Foot swelling or redness with increasing pain      Follow up with the podiatrist you saw in the past.  Get some HIGH Arch supports  Use the ice compress after work.         [address]2000-2013 [employer], [address], [place], [place] [id number]. All rights reserved. This information is not intended as a substitute for professional medical care. Always follow your healthcare professional's instructions.

## 2013-09-02 NOTE — Progress Notes (Signed)
Sharon ProudJulia Raye Stephens is a 38 year old female who presents [facility] with a Foot Pain   38 year old female is here with plantar fasciitis.  This is not a new problem but apparently has become worse again.  She apparently had an injection in her foot given by the [facility].  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  She also works the Ambulance personcash register.  She used to weigh 200 pounds more but still has excessive weight despite her weight surgery.  She has been taking 4 of the 800 mg ibuprofen daily for the last 2 weeks.  She has been using ice on the foot but is not using any R supports.  She presents today wearing Nike air shoes but absolutely no arch support and a rigid firm soled shoe.  She has an appointment with the sports and spine people I gave her a shot on the ninth of this next month and appointment with Fuller PlanNeysa Koury in a day.  She has allergy to penicillin and to codeine.  She states she had an actual MRI of her foot in the past due to the pain in her foot.    History   Substance Use Topics   . Smoking status: Former Games developermoker   . Smokeless tobacco: Never Used   . Alcohol Use: No       Outpatient Prescriptions Prior to Visit   Medication Sig Dispense Refill   . Acetaminophen 500 MG Oral Tab Take 1 tablet (500 mg) by mouth every 4 hours as needed for pain.  60 tablet  2   . Cholecalciferol (VITAMIN D) 1000 UNITS Oral Tab TAKE 1 TABLET DAILY       . FLUoxetine HCl 40 MG Oral Cap one capsule daily  30 Cap  3   . Hydrocortisone 2.5 % External Cream apply to affected areas daily as needed  20 g  1   . Levonorgestrel 20 MCG/24HR Intrauterine IUD as directed       . Multiple Vitamin (MULTI VITAMIN MENS OR) Once daily       . Nitrofurantoin Macrocrystal 50 MG Oral Cap 1 CAPSULE with intercourse for postcoital prophalaxis  30 Cap  2   . SUMAtriptan Succinate (IMITREX) 50 MG Oral Tab 1 tablet at headache onset, repeat every 2  hrs. if needed, max 200mg  per 24 hrs.  18 Tab  0   . Triamcinolone Acetonide 0.1 % External Cream Apply peas sized amount to the face rash and neck rash once a day X 1 week then reduce to once or twice a week for a month then dc  45 g  0     Facility-Administered Medications Prior to Visit   Medication Dose Route Frequency Provider Last Rate Last Dose   . cyanocobalamin (Vitamin B12) 1,000 mcg/mL injection  1,000 mcg Intramuscular Q30 Days Suzette BattiestOsborn, Justin Eugene   1,000 mcg at 08/23/13 1318   . cyanocobalamin (Vitamin B12) 1,000 mcg/mL injection  1,000 mcg Intramuscular Q30 Days Heinen, Corinne S   1,000 mcg at 07/11/13 1154     There are no discontinued medications.      MEDS REVIEWED AND ADJUSTMENTS MADE IN LIST BEFORE DISCHARGE.    Review of patient's allergies indicates:  Allergies   Allergen Reactions   . Amoxicillin      Unsure from childhood    . Codeine Stomach Cramps   .  Penicillin G    . Penicillins      Unsure from childhood        Past Surgical History   Procedure Laterality Date   . C-sections     . Gallbladder removed     . Tube tied     . Tonsillectomy one-half <age 34     . Appendectomy     . Cesarean delivery only     . Lig/trnsxj flp tube abdl/vag appr uni/bi     . Cholecystectomy     . Bariatric surgery  2011       Parts of his medical record are completed using a dragon dictation system.      Review of Systems   Constitutional: Positive for activity change. Negative for chills.   Musculoskeletal: Positive for arthralgias and gait problem.   Skin: Negative for color change and rash.       Physical Exam   Constitutional: She is oriented to person, place, and time. She appears well-developed and well-nourished. No distress.   Markedly elevated BMI   HENT:   Head: Normocephalic and atraumatic.   Eyes: Conjunctivae are normal.   Neck: Neck supple.   Cardiovascular: Intact distal pulses.    Pulmonary/Chest: Effort normal.   Musculoskeletal:   Point tenderness at the front of the left heel on the bottom  of the foot.  There is no redness or increased warmth.   Neurological: She is alert and oriented to person, place, and time.   Skin: Skin is warm. No erythema.   Psychiatric: She has a normal mood and affect. Her behavior is normal. Judgment normal.   Nursing note and vitals reviewed.    (728.71) Plantar fasciitis of left foot  (primary encounter diagnosis)  Plan: I spent a lot of time discussing treatment with her.  I emphasized again the need for shoes with a support it is higher than normal.  Have given her several ideas to accomplish this including Dr. Margart Sickles support systems, trial of sketchers shoes, and building her own arch support.  I recommended some shoes that are softer and cushioning to help provide a more natural arch support also.  Have suggested she follow-up with the podiatrist as there is no amount of pain medication is given a resolve the issue until she has a treated appropriately.      Plantar Fasciitis  The "plantar fascia" is a thick fibrous layer of tissue that covers the bones on the bottom of your foot. It supports the foot bones in an arched position. "Plantar fasciitis" is a painful inflammation of the plantar fascia. This can develop gradually or suddenly. It usually affects one foot at a time. Heel pain can be sharp and feel like a knife sticking in the bottom of your foot. Pain may occur after exercising, long distance jogging, stair climbing, long periods of standing, or after getting up from a seated position.  Risk factors include arthritis, diabetes, obesity or recent weight gain, flat-foot, high arch, wearing high heels or loose shoes or shoes with a poor arch support.  Foot pain from this condition is usually worse in the morning and improves with walking. By the end of the day there may be a dull aching. Treatment requires short-term rest and controlling inflammation. It may take up to nine months before all symptoms go away with the measures described below. Rarely, a steroid  injection into the foot or surgery may be needed.  Home Care  1. If you are overweight,  lose weight to promote healing.  2. Choose supportive shoes with good arch support and shock absorbency. Replace athletic shoes when they become worn out. Don't walk or run barefoot.  3. Shoe inserts are an important part of treatment. These will provide optimal arch support. While you can buy off-the-shelf shoe inserts inexpensively, the best ones are those made for you by a podiatrist (foot specialist).  4. Night splints (provided by a podiatrist) keep the heel stretched out while you sleep and prevent morning pain.  5. Avoid activities that stress the feet: jogging, prolonged standing or walking, contact sports, etc.  6. First thing in the morning and before sports, stretch the bottom of your feet. Gently flex your ankle so the foot moves toward your knee.  7. Icing may help control heel pain. Apply an ice pack (ice cubes in a plastic bag, wrapped in a towel) to the heel for 10-20 minutes as a preventive or after an acute flare of symptoms. You may repeat this every 1-2 hours as needed.  8. You may use acetaminophen (Tylenol) or ibuprofen (Motrin, Advil) to control pain, unless another medicine was prescribed. [NOTE: If you have chronic liver or kidney disease or ever had a stomach ulcer or GI bleeding, talk with your doctor before using these medicines.]  Follow Up  with your doctor or a podiatrist (foot specialist) as advised by our staff. Call for an appointment if pain worsens or there is no relief after a few weeks of home treatment. Shoe inserts, a night splint or a special boot may be required.  [NOTE: If x-rays were taken, they will be reviewed by a radiologist. You will be notified of any new findings that may affect your care.]  Return Promptly  or contact your physician if any of the following occurs:   Foot swelling or redness with increasing pain      Follow up with the podiatrist you saw in the past.  Get some  HIGH Arch supports  Use the ice compress after work.         [address], [place], [place] [address]. All rights reserved. This information is not intended as a substitute for professional medical care. Always follow your healthcare professional's instructions.

## 2013-09-05 ENCOUNTER — Encounter (HOSPITAL_BASED_OUTPATIENT_CLINIC_OR_DEPARTMENT_OTHER): Payer: Medicare Other | Admitting: Sports Medicine

## 2013-09-07 ENCOUNTER — Encounter (INDEPENDENT_AMBULATORY_CARE_PROVIDER_SITE_OTHER): Payer: Self-pay | Admitting: Medical

## 2013-09-10 ENCOUNTER — Encounter (HOSPITAL_BASED_OUTPATIENT_CLINIC_OR_DEPARTMENT_OTHER): Payer: Medicare Other | Admitting: Rehabilitative and Restorative Service Providers"

## 2013-09-12 ENCOUNTER — Encounter (HOSPITAL_BASED_OUTPATIENT_CLINIC_OR_DEPARTMENT_OTHER): Payer: Medicare Other | Admitting: Physical Medicine & Rehabilitation

## 2013-09-19 ENCOUNTER — Ambulatory Visit (INDEPENDENT_AMBULATORY_CARE_PROVIDER_SITE_OTHER): Payer: Medicare Other | Admitting: Family Medicine

## 2013-09-28 ENCOUNTER — Encounter (INDEPENDENT_AMBULATORY_CARE_PROVIDER_SITE_OTHER): Payer: Medicare Other | Admitting: Family Medicine

## 2013-09-29 ENCOUNTER — Other Ambulatory Visit (EMERGENCY_DEPARTMENT_HOSPITAL): Payer: Self-pay | Admitting: Critical Care Medicine

## 2013-09-29 ENCOUNTER — Other Ambulatory Visit (EMERGENCY_DEPARTMENT_HOSPITAL): Payer: Self-pay

## 2013-09-29 ENCOUNTER — Emergency Department
Admission: EM | Admit: 2013-09-29 | Discharge: 2013-09-29 | Disposition: A | Discharge status: Home / Self Care | Payer: Medicare Other | Attending: Emergency Medicine | Admitting: Emergency Medicine

## 2013-09-29 DIAGNOSIS — B373 Candidiasis of vulva and vagina: Secondary | ICD-10-CM | POA: Insufficient documentation

## 2013-09-29 DIAGNOSIS — B3731 Acute candidiasis of vulva and vagina: Secondary | ICD-10-CM

## 2013-09-29 LAB — GRAM STAIN: Gram Smear: NONE SEEN

## 2013-09-29 LAB — URINALYSIS COMPLETE, URN
Bilirubin (Qual), URN: NEGATIVE
Epith Cells_Renal/Trans,URN: NEGATIVE /HPF
Glucose Qual, URN: NEGATIVE mg/dL
Ketones, URN: NEGATIVE mg/dL
Leukocyte Esterase, URN: POSITIVE — AB
Nitrite, URN: NEGATIVE
Protein (Alb Semiquant), URN: NEGATIVE mg/dL
Specific Gravity, URN: 1.019 g/mL (ref 1.002–1.027)
Urobilinogen, URN: 4 EHRLICH UNITS — AB
WBC, URN: NEGATIVE /HPF
pH, URN: 7 (ref 5.0–8.0)

## 2013-09-30 LAB — TRICHOMONAS NUCLEIC ACID: Trichomonas Nucleic Acid Res.: NEGATIVE

## 2013-09-30 LAB — GC&CHLAM NUCLEIC ACID DETECTN
Chlam Trachomatis Nucleic Acid: NEGATIVE
N.Gonorrhoeae(GC) Nucleic Acid: NEGATIVE

## 2013-10-06 LAB — R/O YEAST CULT W/DIRECT EXAM: Stain For Fungus: NONE SEEN

## 2013-10-12 ENCOUNTER — Ambulatory Visit: Payer: Medicare Other | Admitting: Obstetrics & Gynecology

## 2013-10-12 ENCOUNTER — Encounter (INDEPENDENT_AMBULATORY_CARE_PROVIDER_SITE_OTHER): Payer: Self-pay | Admitting: Obstetrics & Gynecology

## 2013-10-12 VITALS — BP 124/87 | HR 66 | Temp 97.8°F | Resp 18 | Ht 64.0 in | Wt 352.6 lb

## 2013-10-12 DIAGNOSIS — J209 Acute bronchitis, unspecified: Secondary | ICD-10-CM

## 2013-10-12 MED ORDER — GUAIFENESIN-CODEINE 100-10 MG/5ML OR SYRP
5.0000 mL | ORAL_SOLUTION | ORAL | Status: DC | PRN
Start: 2013-10-12 — End: 2013-12-15

## 2013-10-12 MED ORDER — AZITHROMYCIN 250 MG OR TABS
250.0000 mg | ORAL_TABLET | Freq: Every day | ORAL | Status: DC
Start: 2013-10-12 — End: 2013-12-15
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
## 2013-10-12 NOTE — Patient Instructions (Signed)
Thank you for choosing the Urgent Care Clinic at [facility] for your visit today.     Please follow instructions as discussed with Dr Cathren HarshVillanueva as outlined below:    -----Please take medication(s) for bronchitis as instructed    -----The over-the-counter medication recommended to you: [facility] D for congestion    Please follow-up with your primary care provider in 5-7 days (if needed/if discussed)

## 2013-11-14 ENCOUNTER — Ambulatory Visit: Payer: Medicare Other | Admitting: Obstetrics & Gynecology

## 2013-11-14 ENCOUNTER — Encounter (INDEPENDENT_AMBULATORY_CARE_PROVIDER_SITE_OTHER): Payer: Self-pay | Admitting: Obstetrics & Gynecology

## 2013-11-14 VITALS — BP 138/93 | HR 80 | Temp 96.9°F | Resp 18 | Ht 64.0 in | Wt 352.0 lb

## 2013-11-14 DIAGNOSIS — IMO0002 Reserved for concepts with insufficient information to code with codable children: Secondary | ICD-10-CM

## 2013-11-14 DIAGNOSIS — S86911A Strain of unspecified muscle(s) and tendon(s) at lower leg level, right leg, initial encounter: Secondary | ICD-10-CM

## 2013-11-14 MED ORDER — PREDNISONE 20 MG OR TABS
20.0000 mg | ORAL_TABLET | Freq: Every day | ORAL | Status: DC
Start: 2013-11-14 — End: 2013-12-15

## 2013-11-14 MED ORDER — HYDROCODONE-ACETAMINOPHEN 5-325 MG OR TABS
1.0000 | ORAL_TABLET | ORAL | Status: DC | PRN
Start: 2013-11-14 — End: 2013-12-08
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
## 2013-11-14 NOTE — Patient Instructions (Signed)
Thank you for choosing the Urgent Care Clinic at [facility] for your visit today.     Please follow instructions as discussed with Dr Cathren HarshVillanueva as outlined below:    -----Please take medication(s) for knee strain as instructed    -----The over-the-counter medication recommended to you: ibuprofen as needed for pain    Please follow-up with your primary care provider in 2 days (if needed/if discussed)

## 2013-11-16 ENCOUNTER — Ambulatory Visit (INDEPENDENT_AMBULATORY_CARE_PROVIDER_SITE_OTHER): Payer: Self-pay | Admitting: Family Practice

## 2013-11-16 ENCOUNTER — Ambulatory Visit (INDEPENDENT_AMBULATORY_CARE_PROVIDER_SITE_OTHER): Payer: Medicare Other | Admitting: Family Practice

## 2013-12-06 ENCOUNTER — Encounter (INDEPENDENT_AMBULATORY_CARE_PROVIDER_SITE_OTHER): Payer: Medicare Other | Admitting: Family Medicine

## 2013-12-08 ENCOUNTER — Encounter (INDEPENDENT_AMBULATORY_CARE_PROVIDER_SITE_OTHER): Payer: Self-pay | Admitting: Family Medicine

## 2013-12-08 ENCOUNTER — Ambulatory Visit (INDEPENDENT_AMBULATORY_CARE_PROVIDER_SITE_OTHER): Payer: Medicare Other | Admitting: Family Medicine

## 2013-12-08 VITALS — BP 145/84 | HR 80 | Wt 343.0 lb

## 2013-12-08 DIAGNOSIS — F32A Depression, unspecified: Secondary | ICD-10-CM

## 2013-12-08 DIAGNOSIS — F329 Major depressive disorder, single episode, unspecified: Secondary | ICD-10-CM

## 2013-12-08 DIAGNOSIS — M549 Dorsalgia, unspecified: Secondary | ICD-10-CM

## 2013-12-08 DIAGNOSIS — F3289 Other specified depressive episodes: Secondary | ICD-10-CM

## 2013-12-08 MED ORDER — CYANOCOBALAMIN 1000 MCG/ML IJ SOLN
1000.0000 ug | Freq: Once | INTRAMUSCULAR | Status: AC
Start: 2013-12-08 — End: 2013-12-08
  Administered 2013-12-08: 1000 ug via INTRAMUSCULAR

## 2013-12-08 MED ORDER — HYDROCODONE-ACETAMINOPHEN 5-325 MG OR TABS
1.0000 | ORAL_TABLET | ORAL | Status: DC | PRN
Start: 2013-12-08 — End: 2013-12-15

## 2013-12-08 MED ORDER — KETOROLAC TROMETHAMINE 30 MG/ML IJ SOLN
30.0000 mg | Freq: Once | INTRAMUSCULAR | Status: AC
Start: 2013-12-08 — End: 2013-12-08
  Administered 2013-12-08: 30 mg via INTRAMUSCULAR

## 2013-12-08 MED ORDER — FLUOXETINE HCL 40 MG OR CAPS
40.0000 mg | ORAL_CAPSULE | Freq: Every day | ORAL | Status: DC
Start: 2013-12-08 — End: 2014-10-03

## 2013-12-08 NOTE — Progress Notes (Signed)
Vaccine Screening Questions      1.  Have you had a serious reaction or an allergic reaction to a vaccine?  NO    2.  Currently have a moderate or severe illness, including fever? (Don't Ask if vaccine   ordered by provider same day)  NO    3.  Ever had a seizure or a brain or other nervous system problem syndrome associated with a vaccine? (DTaP/TDaP/DTP pertinent) NO    4.  Is patient receiving  any live vaccinations today? (Varicella-Chickenpox, MMR-Measles/Mumps/Rubella, Zoster-Shingles)  NO    If YES to any of the questions above - Do NOT give vaccine.  Consult with RN or provider in clinic.  (#4 can be YES if all Live vaccine questions are answered NO)    If NO to all questions above - Patient may receive vaccine.    5.  Do you need to receive the Flu vaccine today? NO    Vaccine information sheet(s) discussed, patient/parent/guardian verbalized understanding? YES     VIS given 12/08/2013 by Loreen Freud MA        Vaccine given today without initial adverse effect. YES    Britteny Fiebelkorn MA

## 2013-12-08 NOTE — Progress Notes (Signed)
Sharon Stephens is a 38 year old female here to discuss the following:    (724.5) Back pain  (primary encounter diagnosis)    Back pain: injured once a year ago, got better with PT. Now has been getting aggravated, went to ER [facility] last week and the one before for this-gave her prednisone (5 day burst). Has tried ice/heat/core exercises. Taking Miotrin 800mg  TID. Has tried Flexeril and hydrocodone. 2 weeks ago started getting worse. Chronic low back pain, lumbar. Buttocks also hurt. Pain into upper thigh. Sitting and standing are worse. Laying down with legs flexed is best position. Some urge incontinence at night because it takes longer to get up, none during the day. Pain does not wake her at night, no saddle anesthesia.    7 people, 2 grand babies, 1 disabled uncle  Works in [employer], sitting and standing a lot- putting a lot of strain on her spine.    REVIEW OF SYSTEMS:  Per HPI    OBJECTIVE:  BP 145/84  Pulse 80  Wt 343 lb (155.584 kg)  General: alert, no distress  Low back- symmetric pelvis, decreased lumbar lordosis, limited lumbar flexion to 30 with pain, full extension, tender paraspinous muscles and SI jt, no segmental motion tenderness  Neuro-  can toe and heel walk without difficulty, 5/5 strength in great toe dorsiflexion, ankle dorsiflexion, and ankle lateral rotation/eversion, slump test causes increased low back pain but no radicular symptoms  PHQ-9: 10  GAD-7: 5    ASSESSMENT AND PLAN:    (724.5) Back pain  (primary encounter diagnosis): MSK related versus early arthritis (given obesity), 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 Discussed that best therapy is PT. Requests hydrocodone and we had long conversation about risks and how it is not sustainable to depend on this type of medication to be able to get through work or household responsibilities.  Plan:   --cyanocobalamin (Vitamin B12) 1,000 mcg/mL injection, ketorolac 30 mg/mL injection  --REFERRAL TO PHYSICAL  THERAPY  --Hydrocodone 5mg , 15 tabs  --F/U with PCP next week    (311) Depression: stable with PHQ-9 score of 10. Refilled.  Plan: FLUoxetine HCl 40 MG Oral Cap            Keymari Sato Dopino Canary Brim

## 2013-12-08 NOTE — Progress Notes (Signed)
-------------------------------------------    Attending: Fannie Knee  I discussed this patient's history and physical findings with the resident, as well as the plan for this patient.   Key findings based upon this discussion:  Post bariatric surgery and lost 200 pounds.  Recently with exacerbation of chronic low back pain.  No red flags.  Feels like her posture is different and might be the cause.  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  Has tried IBP and a prednisone burst.  Short course of hydrocodone but short term only.      -------------------------------------------

## 2013-12-08 NOTE — Progress Notes (Signed)
------------------------------------------    Attending: Daizy Outen Vre[place] Tauna Macfarlane  I agree with the findings and plan as documented in the resident's note.  ----------------------------------------

## 2013-12-15 ENCOUNTER — Encounter (INDEPENDENT_AMBULATORY_CARE_PROVIDER_SITE_OTHER): Payer: Self-pay | Admitting: Family Medicine

## 2013-12-15 ENCOUNTER — Ambulatory Visit (INDEPENDENT_AMBULATORY_CARE_PROVIDER_SITE_OTHER): Payer: Medicare Other | Admitting: Family Medicine

## 2013-12-15 ENCOUNTER — Telehealth (INDEPENDENT_AMBULATORY_CARE_PROVIDER_SITE_OTHER): Payer: Self-pay | Admitting: Family Medicine

## 2013-12-15 VITALS — BP 132/84 | HR 66 | Temp 98.5°F | Resp 14 | Wt 341.0 lb

## 2013-12-15 DIAGNOSIS — M545 Low back pain, unspecified: Secondary | ICD-10-CM

## 2013-12-15 DIAGNOSIS — M722 Plantar fascial fibromatosis: Secondary | ICD-10-CM

## 2013-12-15 DIAGNOSIS — Z6841 Body Mass Index (BMI) 40.0 and over, adult: Secondary | ICD-10-CM

## 2013-12-15 MED ORDER — PREDNISONE 20 MG OR TABS
40.0000 mg | ORAL_TABLET | Freq: Every day | ORAL | Status: DC
Start: 2013-12-15 — End: 2014-02-01

## 2013-12-15 MED ORDER — LIDOCAINE 5 % EX PTCH
1.0000 | MEDICATED_PATCH | Freq: Two times a day (BID) | CUTANEOUS | Status: DC | PRN
Start: 2013-12-15 — End: 2014-04-21

## 2013-12-15 MED ORDER — LIDOCAINE 4 % EX PTCH
MEDICATED_PATCH | CUTANEOUS | Status: DC
Start: 2013-12-15 — End: 2013-12-15

## 2013-12-15 MED ORDER — DICLOFENAC SODIUM 1 % TD GEL
2.0000 g | Freq: Four times a day (QID) | TRANSDERMAL | Status: DC
Start: 2013-12-15 — End: 2014-02-16

## 2013-12-15 NOTE — Telephone Encounter (Signed)
CONFIRMED PHONE NUMBER: [phone]  CALLERS FIRST AND LAST NAME: Jasmine December  FACILITY NAME: [facility] TITLE: Pharmacist  CALLERS RELATIONSHIP:OTHER: n/a  RETURN CALL: General message OK     SUBJECT: Prescription Management   REASON FOR REQUEST: Medication Management    MEDICATION: LIDOCAINE 4 % External Patch  CONCERN/QUESTION: Pharmacist called stated that LIDOCAINE 4 % External Patch is not distributed in [place]. The medication that is distributed is Lidoderm 5%. Pharmacist needs authorization to prescribe patient Lidoderm 5%.   PRESCRIBING PROVIDER: Tonye Royalty   DOSE TAKING NOW: 4%  PHARMACY NAME, LOCATION, & PHONE #: n/a

## 2013-12-15 NOTE — Patient Instructions (Addendum)
Call foot physician at [place]: [facility] at [facility] or [place] [phone].  Call plastic surgery for referral.  We will try lidocaine patch and voltaren gel.
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
## 2013-12-15 NOTE — Telephone Encounter (Signed)
Forwarding to Dr Louann Liv    Per patient's pharmacy: Pharmacist called stated that LIDOCAINE 4 % External Patch is not distributed in [place]. The medication that is distributed is Lidoderm 5%. Pharmacist needs authorization to prescribe patient Lidoderm 5%.     Please advise

## 2013-12-15 NOTE — Telephone Encounter (Signed)
Sent new rx for lidoderm patch 5%.

## 2013-12-19 ENCOUNTER — Encounter (HOSPITAL_BASED_OUTPATIENT_CLINIC_OR_DEPARTMENT_OTHER): Payer: Self-pay | Admitting: Surgery of the Hand

## 2014-01-05 ENCOUNTER — Telehealth (INDEPENDENT_AMBULATORY_CARE_PROVIDER_SITE_OTHER): Payer: Self-pay | Admitting: Family Medicine

## 2014-01-05 NOTE — Telephone Encounter (Signed)
Per [facility], denied referral as provider does not do panniculectomies.  Forwarding to [facility], is this a service you provide? If not, please advise on community options.  Thank you!

## 2014-01-27 ENCOUNTER — Other Ambulatory Visit (INDEPENDENT_AMBULATORY_CARE_PROVIDER_SITE_OTHER): Payer: Self-pay | Admitting: Family Medicine

## 2014-01-27 DIAGNOSIS — M722 Plantar fascial fibromatosis: Secondary | ICD-10-CM

## 2014-01-27 NOTE — Telephone Encounter (Signed)
CONFIRMED PHONE NUMBER:   Telephone Information:   Home Phone [phone]   Work Phone Not on file.   Mobile [phone]   Mobile [phone]       CALLERS FIRST AND LAST NAME: Pershing ProudJulia Raye Birden  FACILITY NAME: na TITLE: na  CALLERS RELATIONSHIP:Self  RETURN CALL: Detailed message on voicemail only     SUBJECT: Prescription Management   REASON FOR REQUEST: Refill    MEDICATION: PredniSONE 20 MG Oral Tab  CONCERN/QUESTION:Refill  PRESCRIBING PROVIDER: Louann LivFeliciano  DOSE TAKING NOW: See Above  PHARMACY NAME, LOCATION, & PHONE #:[address]  [place] [id number]  [phone]

## 2014-01-28 ENCOUNTER — Emergency Department
Admission: EM | Admit: 2014-01-28 | Discharge: 2014-01-28 | Disposition: A | Discharge status: Home / Self Care | Payer: Medicare Other | Attending: Acute Care | Admitting: Acute Care

## 2014-01-28 DIAGNOSIS — M722 Plantar fascial fibromatosis: Secondary | ICD-10-CM

## 2014-01-30 NOTE — Telephone Encounter (Signed)
The requested medication requires your authorization because it is outside of the [facility]'s protocols: Steroid Burst.   OK?    If this medication is denied please have your staff inform the patient and schedule an appointment if necessary.

## 2014-01-31 ENCOUNTER — Telehealth (INDEPENDENT_AMBULATORY_CARE_PROVIDER_SITE_OTHER): Payer: Self-pay | Admitting: Family Medicine

## 2014-01-31 NOTE — Telephone Encounter (Signed)
CONFIRMED PHONE NUMBER: [phone]  CALLERS FIRST AND LAST NAME: Sharon Stephens  FACILITY NAME: / TITLE: /  CALLERS RELATIONSHIP:Self  RETURN CALL: No call back needed     SUBJECT: Referral Request Referral  PCP: Tonye RoyaltyFeliciano, [name] ()  [facility] Provider who should or did order the referral. above  Reason for the call: Other Current referral will expire before patient can be scheduled. Patient is requesting a new one [facility] to [facility]  Have you previously requested a referral? Yes, it was requested with another provider, Merceda ElksAlyce Sutko  Referral to what specialty?  Sports/Spine   Symptom(s) for the referral: Patient needs referral for a cortisone shot in foot as well as to address developing plantar faciitis  Name of Doctor and/or Clinic:  [facility]  Specialty Clinic Phone number: na  Specialty Clinic Fax number: na  Have we already made referral? YES, but current one will expire shortly. Patient will check her eCare to confirm it's been created and sent.

## 2014-02-01 ENCOUNTER — Encounter (INDEPENDENT_AMBULATORY_CARE_PROVIDER_SITE_OTHER): Payer: Self-pay | Admitting: Obstetrics & Gynecology

## 2014-02-01 ENCOUNTER — Ambulatory Visit: Payer: Medicare Other | Admitting: Obstetrics & Gynecology

## 2014-02-01 VITALS — BP 147/92 | HR 92 | Temp 97.0°F | Ht 66.0 in | Wt 345.6 lb

## 2014-02-01 DIAGNOSIS — M722 Plantar fascial fibromatosis: Secondary | ICD-10-CM

## 2014-02-01 MED ORDER — HYDROCODONE-ACETAMINOPHEN 5-325 MG OR TABS
1.0000 | ORAL_TABLET | ORAL | Status: DC | PRN
Start: 2014-02-01 — End: 2014-02-08

## 2014-02-01 MED ORDER — PREDNISONE 20 MG OR TABS
40.0000 mg | ORAL_TABLET | Freq: Every day | ORAL | Status: DC
Start: 2014-02-01 — End: 2014-02-16
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
## 2014-02-01 NOTE — Patient Instructions (Signed)
Thank you for choosing the Urgent Care Clinic at [facility] for your visit today.     Please follow instructions as discussed with Dr Cathren HarshVillanueva as outlined below:    -----Please take medication(s) for foot pain as instructed    -----The over-the-counter medication recommended to you: tylenol as needed    Please follow-up with your primary care provider in 5-10 days (if needed/if discussed)

## 2014-02-02 ENCOUNTER — Encounter (HOSPITAL_BASED_OUTPATIENT_CLINIC_OR_DEPARTMENT_OTHER): Payer: Self-pay | Admitting: Plastic and Reconstructive Surgery

## 2014-02-02 ENCOUNTER — Ambulatory Visit: Payer: Medicare Other | Attending: Plastic and Reconstructive Surgery | Admitting: Plastic and Reconstructive Surgery

## 2014-02-02 DIAGNOSIS — E65 Localized adiposity: Secondary | ICD-10-CM | POA: Insufficient documentation
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
## 2014-02-02 NOTE — Patient Instructions (Signed)
We recommend that you follow up with the bariatric clinic to see a nutritionist to discuss your diet and strategies for weight loss.    Your goal should be to reach a weight of less than 250 pounds before undergoing panniculectomy.

## 2014-02-02 NOTE — Telephone Encounter (Signed)
Patient was seen at [facility], new referral was placed.   [facility] at [facility]

## 2014-02-03 NOTE — Progress Notes (Signed)
I, Tana CoastShannon Tametra Ahart, saw and examined the patient and formulated the management plan.  I agree with the note above.  I spent more than 30 minutes of face-to-face time with the patient, over half of which was patient education and counseling.    This woman saw my colleague, Dr Magnus IvanLouie, last year and was advised to lose further weight (~100lb) prior to panniculectomy.  She presents today for a 2nd opinion.  In looking at her chart, she has missed her regular followup with bariatric clinic, and has in fact has gained weight.  This makes her a poor candidate for panniculectomy, given the instability and tendency toward increasing weight.  She does not appear to be following a strict diet, and does minimal exercise.  I encouraged her that although she did lose a lot of weight from surgery, that surgery alone will not keep it off, and that she must make a dedicated effort towards getting to a healthy weight.  We reviewed the risks and benefits of panniculectomy, and at this stage I believe the risks are too great at her current weight.  We reviewed that panniculectomy is not a form of weight loss, and will likely do little to ameliorate her plantar fasciitis and back pain.  These are more related to her weight and deconditioning.      I therefore advised her to get on a proper plan of healthy diet and exercise, and re-establish regular follow up with the bariatric clinic to work on continued weight loss.  I would consider her for panniculectomy if she loses more weight (ie, gets closer to 250lb) and shows stability in her weight.    Wille CelesteShannon M. Patricia Pesaolohan, MD

## 2014-02-03 NOTE — Addendum Note (Signed)
Addended by: Johnell ComingsOLOHAN, Felissa Blouch MARGARET on: 02/03/2014 10:29 AM     Modules accepted: Level of Service

## 2014-02-08 ENCOUNTER — Ambulatory Visit (HOSPITAL_BASED_OUTPATIENT_CLINIC_OR_DEPARTMENT_OTHER): Payer: Medicare Other | Attending: Physical Medicine & Rehabilitation | Admitting: Physical Medicine & Rehabilitation

## 2014-02-08 ENCOUNTER — Encounter (HOSPITAL_BASED_OUTPATIENT_CLINIC_OR_DEPARTMENT_OTHER): Payer: Self-pay | Admitting: Physical Medicine & Rehabilitation

## 2014-02-08 VITALS — BP 160/93 | HR 84 | Ht 65.0 in | Wt 345.0 lb

## 2014-02-08 DIAGNOSIS — M722 Plantar fascial fibromatosis: Secondary | ICD-10-CM | POA: Insufficient documentation

## 2014-02-08 MED ORDER — HYDROCODONE-ACETAMINOPHEN 5-325 MG OR TABS
ORAL_TABLET | ORAL | Status: DC
Start: 2014-02-08 — End: 2014-02-16

## 2014-02-10 NOTE — Progress Notes (Signed)
Sharon Stephens, Verlon RAYE          [id number]          02/08/2014    [facility]    HISTORY     This is a 38 year old woman who presents today for re-evaluation regarding left greater than right heel pain.  She was seen once previously in September 2014.  At that time, her signs and symptoms were consistent with left plantar fasciitis.  She underwent an ultrasound-guided corticosteroid injection in a perifascial pattern in October 2014 performed by Dr. Clydie BraunBhatti.  The patient reports today that she had 7 months of very substantial pain relief.  Her symptoms have gradually recurred.  She reports the onset of symptoms to be related to prolonged standing.  She now is working full-time and standing 10 hours per day.  The pain is sharp and localized in the left heel, but at times in her arch.  She has recently noted some right heel pain.    She did see her primary care provider recently.  She was treated with a course of prednisone 40 mg daily for three days.  This provided some temporary benefit.  She is also prescribed hydrocodone 5/325, 10 tablets, and she has used these.  She has been taking on average 1 or 2 per day.  She does not take these during her work day.  She also ices her heel upon returning home after work.    She has had no other interim health problems.    EXAMINATION       WEIGHT     VITAL SIGNS: 345 pounds.  MUSCULOSKELETAL:  Localized tenderness at the left medial heel, more so than right.  NEUROLOGIC:  Motor, sensory, reflex and coordination within normal limits.  VASCULAR:  Peripheral pulse intact.  SKIN:  No rashes.    DISCUSSION     This is a 38 year old woman who has a history of chronic left plantar fasciitis and likely right plantar fasciitis, as well.  Her symptoms are in large part related to weight, as recently more prolonged standing.    PLAN     We discussed long-term options of reducing her weight to a more manageable level.  Of note is that she did weigh well over 500 pounds prior to  bariatric surgery.  She states that she is hoping to have a surgical procedure to remove the excessive panniculus around the abdomen.  However, her surgeons have indicated that she will need to have weight reduction to 250 pounds before they will do this.  She is struggling with this because of her inability to walk due to heel pain.  She has benefited from the corticosteroid injections one year ago.  She is trying various other measures at this time.    I am agreeable to proceed with a repeat left periplantar fascial corticosteroid injection.  I will refer her back to Dr. Clydie BraunBhatti for this. We discussed custom orthotics, and I have given her prescription.  We discussed the use of night splints and she will look into this option.  At her request and for her convenience, I did prescribe hydrocodone 5/325, #14, to be taken no more than two per day and not during the day.  However, I have indicated further prescriptions will have to come through her primary care provider.    I have answered her questions and she is agreeable with this plan.    Today's visit was 30 minutes, greater than 50% of which was  counseling and coordination of care.

## 2014-02-16 ENCOUNTER — Ambulatory Visit (INDEPENDENT_AMBULATORY_CARE_PROVIDER_SITE_OTHER): Payer: Medicare Other | Admitting: Family Medicine

## 2014-02-16 ENCOUNTER — Encounter (INDEPENDENT_AMBULATORY_CARE_PROVIDER_SITE_OTHER): Payer: Self-pay | Admitting: Family Medicine

## 2014-02-16 VITALS — BP 134/89 | HR 80 | Temp 97.9°F | Resp 18 | Wt 343.4 lb

## 2014-02-16 DIAGNOSIS — M545 Low back pain, unspecified: Secondary | ICD-10-CM

## 2014-02-16 DIAGNOSIS — E876 Hypokalemia: Secondary | ICD-10-CM

## 2014-02-16 DIAGNOSIS — Z9884 Bariatric surgery status: Secondary | ICD-10-CM

## 2014-02-16 MED ORDER — CYANOCOBALAMIN 1000 MCG/ML IJ SOLN
1000.0000 ug | Freq: Once | INTRAMUSCULAR | Status: DC
Start: 2014-02-16 — End: 2014-02-16
  Administered 2014-02-16: 1000 ug via INTRAMUSCULAR

## 2014-02-16 MED ORDER — CYANOCOBALAMIN 1000 MCG/ML IJ SOLN
1000.0000 ug | INTRAMUSCULAR | Status: AC
Start: 2014-02-16 — End: 2015-03-13
  Administered 2014-04-27 – 2014-10-03 (×2): 1000 ug via INTRAMUSCULAR

## 2014-02-16 MED ORDER — TRAMADOL HCL 50 MG OR TABS
ORAL_TABLET | ORAL | Status: DC
Start: 2014-02-16 — End: 2014-04-21

## 2014-02-16 MED ORDER — CYCLOBENZAPRINE HCL 10 MG OR TABS
ORAL_TABLET | ORAL | Status: DC
Start: 2014-02-16 — End: 2014-05-16

## 2014-02-16 NOTE — Progress Notes (Signed)
-------------------------------------------    Attending: Eustace QuailH T Ruba Outen, MD  I discussed this patient's history and physical findings with the resident, as well as the plan for this patient.   Key findings based upon this discussion:  38 year old woman with obesity here for right sided lower back pain after falling down some stairs  Blood pressure 134/89, pulse 80, temperature 97.9 F (36.6 C), temperature source Temporal, resp. rate 18, weight 343 lb 6.4 oz (155.765 kg), SpO2 100 %.BMI = 57  Some tenderness but no dramatic abn  Physical therapy  Tramadol  Flexeril  No narcotics after review Dr. Louann LivFeliciano.  See note from Dr. Marlyne BeardsSiebert  -------------------------------------------
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
## 2014-02-16 NOTE — Patient Instructions (Signed)
Please call to schedule your physical therapy if you have not heard from the schedulers in about 2 days.    Please take the prescriptions to the pharmacy.    We gave you a B12 shot today.    Come back to see Dr. Louann LivFeliciano in a few weeks.

## 2014-02-27 ENCOUNTER — Encounter (HOSPITAL_BASED_OUTPATIENT_CLINIC_OR_DEPARTMENT_OTHER): Payer: Self-pay | Admitting: Sports Medicine

## 2014-02-27 ENCOUNTER — Ambulatory Visit (HOSPITAL_BASED_OUTPATIENT_CLINIC_OR_DEPARTMENT_OTHER): Payer: Medicare Other | Attending: Sports Medicine | Admitting: Sports Medicine

## 2014-02-27 VITALS — BP 146/91 | HR 84 | Ht 65.0 in | Wt 336.0 lb

## 2014-02-27 DIAGNOSIS — M722 Plantar fascial fibromatosis: Secondary | ICD-10-CM | POA: Insufficient documentation

## 2014-02-27 NOTE — Progress Notes (Signed)
02/27/2014    Sharon Stephens  [id number]    Referral source: Dr. Blaine Hamper     Procedure note:   Chief complaint: US-guided left plantar fascia injection    History of present illness:     Medications and Allergies:   Medications and allergies were reviewed with the patient. No contraindications were identified.     Consent: Potential risks including pain, serious infection, bleeding, nerve injury, allergic reaction, and others were discussed with the patient. The patient understands the benefits, risks, and alternatives of the procedure and agrees to the procedure today. Written consent was obtained.    Procedural details:  Using ultrasound, a prescan of the region was performed to identify the target structure.      The procedure was carried out under sterile technique.    Transducer: A 12-3 mHz linear transducer.   Patient position: Prone with the left ankle hanging off of the table.   Localization process: The plantar fascia was localized in a short axis view at the level of the distal medial calcaneous.   Local anesthesia: No local anesthesia was used.   Needle: A 25 gauge 1.5 inch needle was used for the injection.   Approach: A medial to lateral, in plane, approach was used to guide the needle tip in the plane between the superficial most fibers of the plantar fascia and the overlying fat pad.   Injection/Aspiration: A mixture of 1cc of 1% lidocaine and 1 cc of triamcinolone ( /cc) was injected without complication. The fat pad was visualized lifting off of the plantar fascia as the medication was injected.   Post-procedural care: The patient tolerated the procedure well. The patient was asked to ice for improved pain control and avoid submerging the area in water for the next 48 hours to help reduce the risk of infection. The patient was instructed to call the office immediately if there are any questions or concerns.      Diagnosis:   US-guided left plantar fascia corticosteroid injection    Plan:  1. The  patient will follow up with Dr. Blaine Hamper.  2. All questions answered today in clinic.     Otho Perl, M.D.   Assistant Professor and Attending

## 2014-03-02 ENCOUNTER — Encounter (INDEPENDENT_AMBULATORY_CARE_PROVIDER_SITE_OTHER): Payer: Medicare Other | Admitting: Family Medicine

## 2014-03-02 NOTE — Progress Notes (Signed)
I have personally discussed the case with the resident during or immediately after the patient visit including review of history, physical exam, diagnosis, and treatment plan. I agree with the assessment and plan of care.

## 2014-03-21 ENCOUNTER — Encounter (HOSPITAL_BASED_OUTPATIENT_CLINIC_OR_DEPARTMENT_OTHER): Payer: Medicare Other | Admitting: Physical Medicine & Rehabilitation

## 2014-03-27 ENCOUNTER — Telehealth (HOSPITAL_BASED_OUTPATIENT_CLINIC_OR_DEPARTMENT_OTHER): Payer: Self-pay | Admitting: Physical Medicine & Rehabilitation

## 2014-03-27 NOTE — Telephone Encounter (Signed)
OK 

## 2014-03-27 NOTE — Telephone Encounter (Signed)
Patient was last seen with Dr. Clydie Braun 02/27/14 for a Left foot Ultrasound guided plantar fascia injection.   She has had great results and would like the same injection with the right foot. Please let me know if you are okay with scheduling the patient with Dr. Clydie Braun.

## 2014-04-06 NOTE — Telephone Encounter (Signed)
[facility]

## 2014-04-10 ENCOUNTER — Encounter (INDEPENDENT_AMBULATORY_CARE_PROVIDER_SITE_OTHER): Payer: Self-pay | Admitting: Family Medicine

## 2014-04-10 ENCOUNTER — Ambulatory Visit: Payer: Medicare Other | Admitting: Family Medicine

## 2014-04-10 VITALS — BP 130/83 | HR 68 | Temp 97.5°F | Resp 20 | Ht 66.0 in | Wt 343.0 lb

## 2014-04-10 DIAGNOSIS — J40 Bronchitis, not specified as acute or chronic: Secondary | ICD-10-CM

## 2014-04-10 DIAGNOSIS — R0981 Nasal congestion: Secondary | ICD-10-CM

## 2014-04-10 MED ORDER — ALBUTEROL SULFATE HFA 108 (90 BASE) MCG/ACT IN AERS
1.0000 | INHALATION_SPRAY | RESPIRATORY_TRACT | Status: DC | PRN
Start: 2014-04-10 — End: 2015-04-02

## 2014-04-10 MED ORDER — FLUTICASONE PROPIONATE 50 MCG/ACT NA SUSP
2.0000 | Freq: Every day | NASAL | Status: DC
Start: 2014-04-10 — End: 2015-04-02

## 2014-04-10 NOTE — Patient Instructions (Signed)
Acute Bronchitis  Your health care provider has told you that you have acute bronchitis. Bronchitis is infection or inflammation of the bronchial tubes (airways in the lungs). Normally, air moves easily in and out of the airways. Bronchitis narrows the airways, making it harder for air to flow in and out of the lungs. This causes symptoms such as shortness of breath, coughing, and wheezing. Bronchitis can be "acute" or "chronic." Acute means the condition comes on quickly and goes away in a short time. Chronic means a condition lasts a long time and often comes back. Read on to learn more about acute bronchitis.    What Causes Acute Bronchitis?  Acute bronchitis almost always starts as a viral respiratory infection, such as a cold or the flu. Certain factors make it more likely for a cold or flu to turn into bronchitis. These include being very young or very old or having a heart or lung problem. Cigarette smoking also makes bronchitis more likely.  When bronchitis develops, the airways become swollen. The airways may also become infected with bacteria. This is known as a secondary infection.  Diagnosing Acute Bronchitis  Your health care provider will examine you and ask about your symptoms and health history. You may also have a sputum culture to test the fluid in your lungs. Chest X-rays may be done to look for infection in the lungs.  Treating Acute Bronchitis  Bronchitis usually clears up as the cold or flu goes away. You can help feel better faster by doing the following:   Take medication as directed. You may be told to take ibuprofen or other over-the-counter medications. These help relieve inflammation in your bronchial tubes. Your doctor may prescribe an inhaler to help open up the bronchial tubes. Most of the time,acute bronchitisis caused by a viral infection. Antibiotics are usually not prescribed for viral infections.   Drink plenty of fluids, such as water, juice, or warm soup. Fluids loosen  mucus so that you can cough it up. This helps you breathe more easily. Fluids also prevent dehydration.   Make sure you get plenty of rest.   Do not smoke. Do not allow anyone else to smoke in your home.  Recovery and Follow-Up  Follow up with your doctor as you are told. You will likely feel better in a week or two. But a dry cough can linger beyond that time. Let your doctor know if you still have symptoms (other than a dry cough) after 2 weeks. If you're prone to getting bronchial infections, let your doctor know. And take steps to protect yourself from future infections. These steps include stopping smoking and avoiding tobacco smoke, washing your hands often, and getting a yearly flu shot.  When to Call the Doctor  Call the doctor if you have any of the following:   Fever of 100.47F (38.0C) higher   Symptoms that get worse, or new symptoms   Trouble breathing   Symptoms that don't start to improve within a week, or within 3 days of taking antibiotics    2000-2015 [employer] [address] [place], [place] [address]. All rights reserved. This information is not intended as a substitute for professional medical care. Always follow your healthcare professional's instructions.      Acute Sinusitis  Acute sinusitis is inflammation (irritation and swelling) of the sinuses. It is usually from a bacterial infection that follows an upper respiratory viral infection. Your doctor can help you find relief. Read on to learn  more.    What is acute sinusitis?  Sinuses are air-filled spaces in the skull behind the face. They are kept moist and clean by a lining of mucosa. Things such as pollen, smoke, and chemical fumes can irritate the mucosa. It can then become inflamed (swell up). As a response to irritation, the mucosa makes more mucus and other fluids. Tiny hairlike cilia cover the mucosa. Cilia help transport mucus toward the opening of the sinus. Too much mucus may cause the cilia to stop  working. This blocks the sinus opening. A buildup of fluid in the sinuses then leads to symptoms such as pain and pressure. It can also encourage growth of bacteria in the sinuses.  Common symptoms of acute sinusitis  You may have:   Facial sorenesspain   Headache   Fever   Postnasal drip (drainage in the back of the throat)   Congestion   Drainage that is thick and colored, instead of clear   Cough  Diagnosis of acute sinusitis  The doctor will ask about your symptoms and medical history.He or she will examine your ear, nose, and throat. X-rays are usually not needed. If your sinusitis recurs, you may have a culture to check for bacteria or imaging tests.    An evaluation will be done. A culture (sample of mucus) is sometimes taken to check for bacteria. If you have multiple bouts of sinusitis, imaging (X-rays or CAT scans) may be done to check for an anatomic cause of the infection.  Treatment of acute sinusitis  Treatment is designed to unblock the sinus opening and help the cilia work again. Antihistamine and decongestant medications may be prescribed. These can reduce inflammation and decrease fluid production. If a bacterial infection is present, it istreated with antibiotic medication for 10 to 14 days. This medication should be taken until it is gone, even if you feel better.   [address] [employer] [address], [place], [place] [address]. All rights reserved. This information is not intended as a substitute for professional medical care. Always follow your healthcare professional's instructions.

## 2014-04-10 NOTE — Progress Notes (Signed)
Patient Referred By: No ref. provider found  Patient's PCP: Tonye Royalty (General)  Chief Complaint   Patient presents with   . FLU     x1 week Pt reports with cough and cold symptoms as well as body aches. Pt states that the cough is productive and in the morning startes as a brownish green and continues to get lighter through the day. She states that there is sinus congestion which is a greenish color as well. She does report hot and cold flashes more so in the evening.        SUBJECTIVE:  Sharon Stephens is an 38 year old female who presents with concerns of cough, congestion, and body aches x 6 days.  Other family member were sick with similar symptoms.  No recent travel.        Outpatient Prescriptions Prior to Visit   Medication Sig Dispense Refill   . Acetaminophen 500 MG Oral Tab Take 1 tablet (500 mg) by mouth every 4 hours as needed for pain. 60 tablet 2   . Cholecalciferol (VITAMIN D) 1000 UNITS Oral Tab TAKE 1 TABLET DAILY     . Cyclobenzaprine HCl (FLEXERIL) 10 MG Oral Tab Take one tablet up to three times daily as needed for spasms. 20 tablet 0   . FLUoxetine HCl 40 MG Oral Cap Take 1 capsule (40 mg) by mouth daily. 30 capsule 3   . Levonorgestrel 20 MCG/24HR Intrauterine IUD as directed     . Lidocaine 5 % External Patch Apply 1 patch onto the skin every 12 hours as needed. Remove patch(s) after 12 hours. 1 Box 1   . Multiple Vitamin (MULTI VITAMIN MENS OR) Once daily     . Nitrofurantoin Macrocrystal 50 MG Oral Cap 1 CAPSULE with intercourse for postcoital prophalaxis 30 Cap 2   . TraMADol HCl 50 MG Oral Tab Take 1-2 as needed for pain every 8 hours, max of 6 per day. Do not drive after taking. 15 tablet 0     Facility-Administered Medications Prior to Visit   Medication Dose Route Frequency Provider Last Rate Last Dose   . cyanocobalamin (Vitamin B12) 1,000 mcg/mL injection  1,000 mcg Intramuscular Q30 Days Philemon Kingdom           Review of patient's allergies indicates:  Allergies      Allergen Reactions   . Amoxicillin      Unsure from childhood    . Codeine Stomach Cramps   . Penicillin G    . Penicillins      Unsure from childhood        History   Substance Use Topics   . Smoking status: Never Smoker    . Smokeless tobacco: Never Used   . Alcohol Use: No       OBJECTIVE:  BP 130/83 mmHg  Pulse 68  Temp(Src) 97.5 F (36.4 C) (Temporal)  Resp 20  Ht 5\' 6"  (1.676 m)  Wt 343 lb (155.584 kg)  BMI 55.39 kg/m2  SpO2 99%           HPI    Review of Systems   Constitutional: Positive for chills. Negative for fever.   HENT: Positive for congestion, postnasal drip, rhinorrhea and sinus pressure. Negative for ear pain and sore throat.    Respiratory: Positive for cough, shortness of breath and wheezing.    Gastrointestinal: Positive for diarrhea (has 3 episode of diarrhea today). Negative for nausea and vomiting.  Objective:  Physical Exam   Constitutional: She is oriented to person, place, and time. She appears well-developed and well-nourished. No distress.   HENT:   Head: Normocephalic and atraumatic.   Right Ear: Tympanic membrane, external ear and ear canal normal.   Left Ear: Tympanic membrane, external ear and ear canal normal.   Nose: Mucosal edema and rhinorrhea present. Right sinus exhibits maxillary sinus tenderness (mild). Right sinus exhibits no frontal sinus tenderness. Left sinus exhibits maxillary sinus tenderness (mild). Left sinus exhibits no frontal sinus tenderness.   Mouth/Throat: Oropharynx is clear and moist and mucous membranes are normal.   Neck: Normal range of motion. Neck supple.   Cardiovascular: Normal rate, regular rhythm and normal heart sounds.    No murmur heard.  Pulmonary/Chest: Effort normal and breath sounds normal. No respiratory distress. She has no wheezes. She has no rales.   Neurological: She is alert and oriented to person, place, and time.   Skin: She is not diaphoretic.   Nursing note and vitals reviewed.    ASSESSMENT/PLAN  (J40) Bronchitis   (primary encounter diagnosis)  Plan: Albuterol Sulfate HFA 108 (90 BASE) MCG/ACT         Inhalation Aero Soln, Fluticasone Propionate 50        MCG/ACT Nasal Suspension  (R09.81) Sinus congestion  Plan: Albuterol Sulfate HFA 108 (90 BASE) MCG/ACT         Inhalation Aero Soln, Fluticasone Propionate 50        MCG/ACT Nasal Suspension    1.  Increase fluids, rest, nasal saline  2.  F/u with primary care in 1-2 weeks, if symptoms don't improve  If symptoms worsen RTC or go to the ER for further evaluation and treatment.  3.  Albuterol MDI, Flonase      Discussed medication in detail including dosing, side effects, and interactions.    Primus BravoAnita  Karsynn Deweese, MD  [facility] [place]  [facility]

## 2014-04-17 ENCOUNTER — Encounter (HOSPITAL_BASED_OUTPATIENT_CLINIC_OR_DEPARTMENT_OTHER): Payer: Self-pay | Admitting: Sports Medicine

## 2014-04-17 ENCOUNTER — Ambulatory Visit (HOSPITAL_BASED_OUTPATIENT_CLINIC_OR_DEPARTMENT_OTHER): Payer: Medicare Other | Attending: Sports Medicine | Admitting: Sports Medicine

## 2014-04-17 VITALS — BP 152/88 | HR 79 | Ht 65.0 in | Wt 352.0 lb

## 2014-04-17 DIAGNOSIS — M722 Plantar fascial fibromatosis: Secondary | ICD-10-CM | POA: Insufficient documentation

## 2014-04-17 NOTE — Progress Notes (Signed)
Specializing in sports-related injuries and non-surgical care for conditions of the spine,  shoulder, elbow, wrist, hand, hip, knee, foot, and ankle.      Pershing ProudJulia Raye Barabas  [id number]    Referral source: Dr. Blaine HamperWeinstein     Procedure note:   Chief complaint: US-guided right plantar fascia injection    History of present illness:   Sharon Stephens is a 38 year old female who presents for a right ultrasound guided plantar fascia steroid injection. The patient was last seen by me on 02/27/14 for a left plantar fascia injection. She states that the left sided injection provided significant relief and she continues to feel benefit from the injection. She does, however, note significant progressively worsening right sided symptoms, consistent with plantar fasciopathy. Today we elected to perform the procedure on the right.       Medications and Allergies:   Medications and allergies were reviewed with the patient. No contraindications were identified.     Consent: Potential risks including pain, serious infection, bleeding, nerve injury, allergic reaction, and others were discussed with the patient. The patient understands the benefits, risks, and alternatives of the procedure and agrees to the procedure today. Written consent was obtained.    Procedural details:  Using ultrasound, a prescan of the region was performed to identify the target structure.      The procedure was carried out under sterile technique.    Transducer: A 12-3 mHz linear transducer.   Patient position: Prone with the right ankle hanging off of the table.   Localization process: The plantar fascia was localized in a short axis view at the level of the distal medial calcaneous.   Local anesthesia: No local anesthesia was used.   Needle: A 25 gauge 1.5 inch needle was used for the injection.   Approach: A medial to lateral, in plane, approach was used to guide the needle tip in the plane between the superficial most fibers of the plantar fascia and the  overlying fat pad.   Injection/Aspiration: A mixture of 1cc of 1% lidocaine and 1 cc of triamcinolone (40mg /cc) was injected without complication. The fat pad was visualized lifting off of the plantar fascia as the medication was injected.   Post-procedural care: The patient tolerated the procedure well. The patient was asked to ice for improved pain control and avoid submerging the area in water for the next 24 hours to help reduce the risk of infection. The patient was instructed to call the office immediately if there are any questions or concerns.        Plan:  1. The patient will follow up with Dr. Blaine HamperWeinstein as needed.  2. All questions answered today in clinic.     ------------------------------------------  Attending: Otho Perlmar Maurice Idali Lafever, MD  I saw and evaluated the patient with the resident.  I discussed the indications for this procedure with the resident. I have reviewed and confirmed the procedure report as documented. I was present for or participated in the entire procedure.   ------------------------------------------

## 2014-04-21 ENCOUNTER — Ambulatory Visit: Payer: Medicare Other | Admitting: Family Medicine

## 2014-04-21 ENCOUNTER — Telehealth (HOSPITAL_BASED_OUTPATIENT_CLINIC_OR_DEPARTMENT_OTHER): Payer: Self-pay

## 2014-04-21 ENCOUNTER — Encounter (INDEPENDENT_AMBULATORY_CARE_PROVIDER_SITE_OTHER): Payer: Self-pay | Admitting: Family Medicine

## 2014-04-21 VITALS — BP 145/103 | HR 72 | Temp 97.2°F | Resp 16 | Ht 65.0 in | Wt 352.0 lb

## 2014-04-21 DIAGNOSIS — M722 Plantar fascial fibromatosis: Secondary | ICD-10-CM

## 2014-04-21 MED ORDER — HYDROCODONE-ACETAMINOPHEN 5-325 MG OR TABS
1.0000 | ORAL_TABLET | Freq: Four times a day (QID) | ORAL | Status: DC | PRN
Start: 2014-04-21 — End: 2014-05-16

## 2014-04-21 NOTE — Progress Notes (Signed)
Sharon Stephens is a 38 year old female who presents to [facility] with a Musculoskeletal Problem  521PM initially not in the room.  Backpack and charging phone in the room.  531 PM I reentered and the patient is sitting on the exam table.  Problems with plantar fasciitis of the right foot.  Received injection with kenalog 4 days ago and states the pain is worse.  Using frozen plastic bottle of water to roll under the foot and also NSAIDs without help.  Now the foot hurts even when she doesn't step on it.  Hard to sleep at night.  Unable to work in [employer] where she works due to the pain.  Unable to afford custom orthotics as her insurance doesn't pay for them.  We discussed options and reviewed her footwear.  Soft well cushioned Nike sandals and her work shoes that are firm without enough arch support.  Has appt with her foot doctor on Monday but needs note for work and something to help pain over the weekend.     Outpatient Prescriptions Prior to Visit   Medication Sig Dispense Refill   . Acetaminophen 500 MG Oral Tab Take 1 tablet (500 mg) by mouth every 4 hours as needed for pain. 60 tablet 2   . Albuterol Sulfate HFA 108 (90 BASE) MCG/ACT Inhalation Aero Soln Inhale 1-2 puffs by mouth every 4 hours as needed for shortness of breath/wheezing. For shortness of breath. 1 Inhaler 0   . Cholecalciferol (VITAMIN D) 1000 UNITS Oral Tab TAKE 1 TABLET DAILY     . Cyclobenzaprine HCl (FLEXERIL) 10 MG Oral Tab Take one tablet up to three times daily as needed for spasms. 20 tablet 0   . FLUoxetine HCl 40 MG Oral Cap Take 1 capsule (40 mg) by mouth daily. 30 capsule 3   . Fluticasone Propionate 50 MCG/ACT Nasal Suspension Spray 2 sprays into each nostril daily. 1 Inhaler 0   . Levonorgestrel 20 MCG/24HR Intrauterine IUD as directed     . Lidocaine 5 % External Patch Apply 1 patch onto the skin every 12 hours as needed. Remove patch(s) after 12 hours. 1 Box 1   . Multiple Vitamin (MULTI VITAMIN MENS  OR) Once daily     . Nitrofurantoin Macrocrystal 50 MG Oral Cap 1 CAPSULE with intercourse for postcoital prophalaxis 30 Cap 2   . TraMADol HCl 50 MG Oral Tab Take 1-2 as needed for pain every 8 hours, max of 6 per day. Do not drive after taking. 15 tablet 0     Facility-Administered Medications Prior to Visit   Medication Dose Route Frequency Provider Last Rate Last Dose   . cyanocobalamin (Vitamin B12) 1,000 mcg/mL injection  1,000 mcg Intramuscular Q30 Days Philemon KingdomSiebert, David         There are no discontinued medications.  MEDS REVIEWED AND ADJUSTMENTS MADE IN LIST BEFORE DISCHARGE.    Review of patient's allergies indicates:  Allergies   Allergen Reactions   . Amoxicillin      Unsure from childhood    . Codeine Stomach Cramps   . Penicillin G    . Penicillins      Unsure from childhood        Past Surgical History   Procedure Laterality Date   . C-sections     . Gallbladder removed     . Tube tied     . Tonsillectomy one-half <age 45     . Appendectomy     .  Cesarean delivery only     . Lig/trnsxj flp tube abdl/vag appr uni/bi     . Cholecystectomy     . Bariatric surgery  2011   . Bariatric surgery       History   Substance Use Topics   . Smoking status: Never Smoker    . Smokeless tobacco: Never Used   . Alcohol Use: No       Parts of his medical record are completed using a dragon dictation system.    Review of Systems   Musculoskeletal: Positive for arthralgias and gait problem. Negative for joint swelling.   Skin: Negative for color change and wound.   Neurological: Negative for numbness.   Psychiatric/Behavioral: Positive for sleep disturbance.       Physical Exam   Constitutional: She is oriented to person, place, and time.   BMI 59   Pulmonary/Chest: Effort normal. No respiratory distress.   Musculoskeletal:   Small feet  Not palpated for the pain.   Neurological: She is alert and oriented to person, place, and time.   Skin: Skin is warm and dry.   Psychiatric: She has a normal mood and affect. Her behavior  is normal. Judgment normal.   Nursing note and vitals reviewed.    (M72.2) Plantar fasciitis of right foot  (primary encounter diagnosis)  Plan: Hydrocodone-Acetaminophen 5-325 MG Oral Tab #15        Discussed shoe options at length.  She will try the arch support in a sleeve and the NIke soft cushion.  Also discussed Berkinstocks or OTC arch supports with a soft heel cushioned supportive shoe.    Patient Instructions   Thank you for choosing the Urgent Care Clinic at [facility] for your visit today.  Your Provider today was Dr Rulon EisenmengerMichael Alfard Cochrane    If you have lab tests that have not been completed at the time of your discharge, you will be contacted when they're completed.  This may be done either via eCare or via the phone.  If you had x-rays today you were given a preliminary diagnosis from the provider.  If something of significance difference is seen by the radiologist you will be contacted and those differences will be described or explained.  If you sign up with eCare you will be able to view the x-ray reports and the lab work within a few days of their completion.    If you have questions about your care you can call the [facility] during office hours at [phone], or contact the 24 hour number at [phone].      Please follow-up with your primary care provider in 3-5 days or sooner if you are getting worse. If you are in need of a primary care provider and would like to establish care with [facility], please call [phone].    Your specific instructions are listed below:  Use a high arch support on a soft cushioned shoe  Use the cold on the arches as you have been  Use hydrocodone every 6 hours for severe pain at rest, NOT so you can walk or do more.  F/U next week with your foot doctor

## 2014-04-21 NOTE — Telephone Encounter (Signed)
Sharon Stephens has been experiencing increased pain s/p most recent injection. She denies fever, redness, swelling or drainage at site. Offered patient an appointment for Monday which she accepted.

## 2014-04-21 NOTE — Patient Instructions (Signed)
Thank you for choosing the Urgent Care Clinic at [facility] for your visit today.  Your Provider today was Dr Rulon EisenmengerMichael Rinaldo Macqueen    If you have lab tests that have not been completed at the time of your discharge, you will be contacted when they're completed.  This may be done either via eCare or via the phone.  If you had x-rays today you were given a preliminary diagnosis from the provider.  If something of significance difference is seen by the radiologist you will be contacted and those differences will be described or explained.  If you sign up with eCare you will be able to view the x-ray reports and the lab work within a few days of their completion.    If you have questions about your care you can call the [facility] during office hours at [phone], or contact the 24 hour number at [phone].      Please follow-up with your primary care provider in 3-5 days or sooner if you are getting worse. If you are in need of a primary care provider and would like to establish care with [facility], please call [phone].    Your specific instructions are listed below:  Use a high arch support on a soft cushioned shoe  Use the cold on the arches as you have been  Use hydrocodone every 6 hours for severe pain at rest, NOT so you can walk or do more.  F/U next week with your foot doctor

## 2014-04-24 ENCOUNTER — Ambulatory Visit (HOSPITAL_BASED_OUTPATIENT_CLINIC_OR_DEPARTMENT_OTHER): Payer: Medicare Other | Attending: Physical Medicine & Rehabilitation | Admitting: Physical Medicine & Rehabilitation

## 2014-04-24 ENCOUNTER — Encounter (HOSPITAL_BASED_OUTPATIENT_CLINIC_OR_DEPARTMENT_OTHER): Payer: Self-pay | Admitting: Physical Medicine & Rehabilitation

## 2014-04-24 ENCOUNTER — Ambulatory Visit (HOSPITAL_BASED_OUTPATIENT_CLINIC_OR_DEPARTMENT_OTHER): Payer: Medicare Other | Admitting: Unknown Physician Specialty

## 2014-04-24 VITALS — BP 160/97 | HR 68 | Ht 66.0 in | Wt 355.0 lb

## 2014-04-24 DIAGNOSIS — M79671 Pain in right foot: Secondary | ICD-10-CM | POA: Insufficient documentation

## 2014-04-24 DIAGNOSIS — M722 Plantar fascial fibromatosis: Secondary | ICD-10-CM | POA: Insufficient documentation

## 2014-04-24 NOTE — Progress Notes (Signed)
Rogue JuryJULIA R. Stephens  MRN [id number]    April 24, 2014    HISTORY: This is a 38 year old woman who presents today for evaluation regarding increasing right heel pain. She is one week status post ultrasound-guided right plantar fascia injection with Dr. Clydie BraunBhatti. The patient notes that her symptoms are increasing. She currently rates her pain as 7/10. She indicates that this is a different response to the previous left-sided injection which helped her nearly immediately. She also states that previous corticosteroid injections in her body have also helped her rather promptly.     She has not had redness, swelling, fevers, or chills.     She states that she has not been able to work as a [employer]person because of difficulty standing and walking.     [facility] ago because of this problem. They did not x-ray her foot. She was prescribed hydrocodone 5/325 #15. They also discussed with her shoes and inserts.     PHYSICAL EXAMINATION:  There is moderate localized tenderness along the arch and medial calcaneus. There is no redness or swelling. There is no red streak in her leg.     X-RAY ASSESSMENT: Two views of the left foot and heel were obtained. There are no acute bony abnormalities noted. There is a plantar osteophyte. There are also degenerative changes of the calcaneal cuboid and talonavicular joint.     DISCUSSION:  This is a 38 year old woman who has a history of chronic bilateral foot pain consistent with plantar fasciitis. She is now approximately one week status post right plantar fascia injection without improvement to date. There is no indication for any complication.     PLAN:  I discussed the use of a CAM walker boot to help unload the plantar fascia and allow her to continue to ambulate. I have suggested to her that there still may be time for the corticosteroid to benefit her. If she does not improve within the next one to two weeks, then I would  suggest referral [facility] where she has been seen before. She will update me regarding her status by telephone in the next one to two weeks.     Today's visit was 15 minutes, greater than 50 percent of which was counseling and coordination of care.     Signature Line  Electronically Reviewed/Signed  _____________________________________  Sharon ShiverStuart M. Blaine HamperWeinstein, MD  Clinical Professor and Attending   [facility]  [phone]  [place], [place] [id number]    SMW/eh

## 2014-04-27 ENCOUNTER — Encounter (INDEPENDENT_AMBULATORY_CARE_PROVIDER_SITE_OTHER): Payer: Self-pay | Admitting: Family Medicine

## 2014-04-27 ENCOUNTER — Ambulatory Visit (INDEPENDENT_AMBULATORY_CARE_PROVIDER_SITE_OTHER): Payer: Medicare Other | Admitting: Family Medicine

## 2014-04-27 VITALS — BP 132/71 | HR 79 | Temp 97.1°F | Resp 16

## 2014-04-27 DIAGNOSIS — M722 Plantar fascial fibromatosis: Secondary | ICD-10-CM

## 2014-04-27 DIAGNOSIS — S1096XA Insect bite of unspecified part of neck, initial encounter: Secondary | ICD-10-CM

## 2014-04-27 DIAGNOSIS — Z9884 Bariatric surgery status: Secondary | ICD-10-CM

## 2014-04-27 DIAGNOSIS — G4733 Obstructive sleep apnea (adult) (pediatric): Secondary | ICD-10-CM

## 2014-04-27 DIAGNOSIS — Z23 Encounter for immunization: Secondary | ICD-10-CM

## 2014-04-27 DIAGNOSIS — W57XXXA Bitten or stung by nonvenomous insect and other nonvenomous arthropods, initial encounter: Secondary | ICD-10-CM

## 2014-04-27 DIAGNOSIS — Z5189 Encounter for other specified aftercare: Secondary | ICD-10-CM

## 2014-04-27 DIAGNOSIS — L309 Dermatitis, unspecified: Secondary | ICD-10-CM

## 2014-04-27 MED ORDER — TRIAMCINOLONE ACETONIDE 0.1 % EX OINT
1.0000 | TOPICAL_OINTMENT | Freq: Two times a day (BID) | CUTANEOUS | Status: DC
Start: 2014-04-27 — End: 2015-04-02

## 2014-04-27 MED ORDER — MUPIROCIN 2 % EX OINT
1.0000 | TOPICAL_OINTMENT | Freq: Three times a day (TID) | CUTANEOUS | Status: DC
Start: 2014-04-27 — End: 2015-04-02

## 2014-04-27 MED ORDER — DICLOFENAC SODIUM 1 % TD GEL
1.0000 g | Freq: Four times a day (QID) | TRANSDERMAL | Status: DC
Start: 2014-04-27 — End: 2015-03-27
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
## 2014-04-27 NOTE — Progress Notes (Signed)
Reason for visit:        Cervical screening/PAP: 11/12/2010 - due now  Mammo:  None   Colon Screen:  None   Have you seen a specialist since your last visit: NO      Name and location and date. N/a      HM Due:   Health Maintenance   Topic Date Due   . PAP SMEAR 21-64 YEARS  11/11/2013   . INFLUENZA VACCINE (1) 03/07/2014   . TETANUS BOOSTER  05/05/2019              Future Appointments  Date Time Provider [place]   06/19/2014 8:30 AM [facility]          Vaccine Screening Questions      1.  Have you had a serious reaction or an allergic reaction to a vaccine?  NO    2.  Currently have a moderate or severe illness, including fever? (Don't Ask if vaccine   ordered by provider same day)  NO    3.  Ever had a seizure or a brain or other nervous system problem syndrome associated with a vaccine? (DTaP/TDaP/DTP pertinent) NO    4.  Is patient receiving  any live vaccinations today? (Varicella-Chickenpox, MMR-Measles/Mumps/Rubella, Zoster-Shingles)  NO    If YES to any of the questions above - Do NOT give vaccine.  Consult with RN or provider in clinic.  (#4 can be YES if all Live vaccine questions are answered NO)    If NO to all questions above - Patient may receive vaccine.    5.  Do you need to receive the Flu vaccine today? YES - Additional Flu Questions  Flu Vaccine Screening Questions:    Ever had a serious allergic reaction to eggs?  NO    Ever had Guillain-Barre syndrome associated with a vaccine? NO    Less than 6 months old? NO    If YES to any of the Flu questions above - NO Flu Vaccine to be given.  Patient may consult provider as needed.    If NO to all questions above - Patient may receive Flu Shot (IM)    If between 6 months and 80 years of age, was flu vaccine received last year?  N/A  If NO to above question:  . For the 2014-15 season, administer 2 doses (separated by at least 4 weeks) to children who are receiving influenza vaccine for the first time.   . For the  2014-15 season, follow dosing guidelines in the 2013 ACIP influenza vaccine recommendations     Flu Mist (Nasal) may be considered if patient is >2 yrs old and < 80 yrs old.    Do you wish to proceed with screening questions for Flu Mist?  NO     Vaccine information sheet(s) discussed, patient/parent/guardian verbalized understanding? YES     VIS given 04/27/2014 by Fernanda Drum MA.      Immunization Documentation:    Patient/Parent has reviewed the appropriate VIS and has all questions answered? YES    Vaccine(s) given today without initial adverse effect? YES    Fernanda Drum, CMA

## 2014-05-01 ENCOUNTER — Telehealth (INDEPENDENT_AMBULATORY_CARE_PROVIDER_SITE_OTHER): Payer: Self-pay | Admitting: Family Medicine

## 2014-05-01 NOTE — Telephone Encounter (Signed)
CONFIRMED PHONE NUMBER: [phone]  CALLERS FIRST AND LAST NAME: Pershing ProudJulia Raye Akamine  FACILITY NAME: na TITLE: na  CALLERS RELATIONSHIP:Self  RETURN CALL: Detailed message on voicemail only     SUBJECT: Letter/Form Request   REASON FOR REQUEST: Missed work Saturday (10/24) and Sunday (10/25) due to foot issue    WHAT IS THE LETTER/FORM FOR: Return to work; date out 04/29/14 and 04/30/14 date back 05/03/14  WHO SHOULD FILL IT OUT: Dr. Louann LivFeliciano  WHERE SHOULD IT BE SENT: Patient would like this added to her E-Care, as well as have a copy sent to her home.   DATE NEEDED BY: ASAP

## 2014-05-01 NOTE — Telephone Encounter (Signed)
Routing to Dr. Louann LivFeliciano- Patient saw you on 10/22, she is needing a work letter for missing work on 10/24 and 10/25 due to her foot pain. She would like it to states returning back on 10/28.

## 2014-05-02 NOTE — Telephone Encounter (Signed)
Done. Placed on mail box.

## 2014-05-02 NOTE — Telephone Encounter (Signed)
Mailed letter to Pt address in chart.

## 2014-05-15 ENCOUNTER — Emergency Department (HOSPITAL_BASED_OUTPATIENT_CLINIC_OR_DEPARTMENT_OTHER)
Admission: EM | Admit: 2014-05-15 | Discharge: 2014-05-15 | Disposition: A | Discharge status: Home / Self Care | Payer: Medicare Other | Attending: Emergency Medical Services | Admitting: Emergency Medical Services

## 2014-05-15 DIAGNOSIS — W108XXA Fall (on) (from) other stairs and steps, initial encounter: Secondary | ICD-10-CM

## 2014-05-15 DIAGNOSIS — E669 Obesity, unspecified: Secondary | ICD-10-CM | POA: Insufficient documentation

## 2014-05-15 DIAGNOSIS — M5441 Lumbago with sciatica, right side: Secondary | ICD-10-CM | POA: Insufficient documentation

## 2014-05-15 LAB — URINALYSIS WITH REFLEX CULTURE
Bilirubin (Qual), URN: NEGATIVE
Epith Cells_Renal/Trans,URN: NEGATIVE /HPF
Glucose Qual, URN: NEGATIVE mg/dL
Ketones, URN: NEGATIVE mg/dL
Leukocyte Esterase, URN: NEGATIVE
Nitrite, URN: NEGATIVE
Occult Blood, URN: NEGATIVE
Protein (Alb Semiquant), URN: NEGATIVE mg/dL
RBC, URN: NEGATIVE /HPF
Specific Gravity, URN: 1.022 g/mL (ref 1.002–1.027)
WBC, URN: NEGATIVE /HPF
pH, URN: 7.5 (ref 5.0–8.0)

## 2014-05-15 LAB — PREGNANCY TEST, URINE
Pregnancy Test, URN: NEGATIVE
Specific Gravity, URN: 1.022 g/mL (ref 1.002–1.027)

## 2014-05-16 ENCOUNTER — Ambulatory Visit (INDEPENDENT_AMBULATORY_CARE_PROVIDER_SITE_OTHER): Payer: Medicare Other | Admitting: Family Medicine

## 2014-05-16 ENCOUNTER — Encounter (INDEPENDENT_AMBULATORY_CARE_PROVIDER_SITE_OTHER): Payer: Self-pay | Admitting: Family Medicine

## 2014-05-16 VITALS — BP 138/84 | HR 78 | Temp 97.6°F | Resp 16 | Wt 354.0 lb

## 2014-05-16 DIAGNOSIS — B3731 Acute candidiasis of vulva and vagina: Secondary | ICD-10-CM

## 2014-05-16 DIAGNOSIS — M5441 Lumbago with sciatica, right side: Secondary | ICD-10-CM

## 2014-05-16 DIAGNOSIS — B373 Candidiasis of vulva and vagina: Secondary | ICD-10-CM

## 2014-05-16 MED ORDER — MELOXICAM 15 MG OR TABS
15.0000 mg | ORAL_TABLET | Freq: Every day | ORAL | Status: DC
Start: 2014-05-16 — End: 2015-03-27

## 2014-05-16 MED ORDER — METHOCARBAMOL 750 MG OR TABS
750.0000 mg | ORAL_TABLET | Freq: Three times a day (TID) | ORAL | Status: DC | PRN
Start: 2014-05-16 — End: 2015-03-27

## 2014-05-16 MED ORDER — FLUCONAZOLE 150 MG OR TABS
150.0000 mg | ORAL_TABLET | Freq: Once | ORAL | Status: AC
Start: 2014-05-16 — End: 2014-05-16

## 2014-05-16 NOTE — Progress Notes (Signed)
Reason for Visit: ER Follow-up/muscle spasms in Leg    Refills? YES  Referral? NO  Letter or Form? NO  Lab Results? NO    HEALTH MAINTENANCE:  Has the patient had this done since their last visit?  Cervical screening/PAP: NO, due now, will schedule exam  Mammo: Current, not due  Colon Screen: Current, not due    Have you seen a specialist since your last visit: No    Vaccines Due? No    HM Due:   Health Maintenance   Topic Date Due   . PAP SMEAR 21-64 YEARS  11/11/2013   . TETANUS BOOSTER  05/05/2019   . INFLUENZA VACCINE  Completed       PCP Verified?  Yes, Dr. Louann LivFeliciano
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
## 2014-05-16 NOTE — Patient Instructions (Addendum)
Try meloxicam daily-do not mix with other NSAID's such as ibuprofen or motrin or aleve or aspirin.   Take methocarbamol muscle relaxant 3 times per day as needed. Be careful because it can make you sleepy.   Continue with heating pad.   Can use topical gel voltaren or diclofenac three times per day over right buttocks.   Do exercises as needed.         Sciatica    Sciatica is a condition that causes pain in the lower back and down into the buttock, hip, and leg. Sometimes the leg pain can happen without any back pain. Sciatica happens when a spinal nerve is irritated or has pressure put on it as comes out of the spinal canal in the lower back. This most often happens when a bulge or rupture of a nearby spinal disk presses on the nerve. Sciatica can also be caused by a narrowing of the spinal canal (spinal stenosis) or spasm of the muscle in the buttocks that the sciatic nerve passes through (pyriform muscle). Sciatica is also called lumbar radiculopathy.  Sciatica may begin after a sudden twisting or bending force, such as in a car accident. Or it can happen after a simple awkward movement. In either case, muscle spasm often also happens. Muscle spasm makes the pain worse.  A health care provider makes a diagnosis of sciatica from your symptoms and a physical exam. Unless you had an injury from a car accident or fall, you usually won't have X-rays taken at this time. This is because the nerves and disks in your back can't be seen on an X-ray. If the provider sees signs of a compressed nerve, you will need to schedule an MRI scan as an outpatient. Signs of a compressed nerve include loss of strength in a leg.  Most sciatica gets better with medicine, exercise, and physical therapy. If your symptoms continue after at least 3 months of medical treatment, you may need surgery.  Home care  Follow these tips when caring for yourself at home:   You may need to stay in bed the first few days. But as soon as possible, begin  sitting or walking. This will help you avoid problems that come from staying in bed for long periods.   When in bed, try to find a position that is comfortable. A firm mattress is best. Try lying flat on your back with pillows under your knees. You can also try lying on your side with your knees bent up toward your chest and a pillow between your knees.   Avoid sitting for long periods. This puts more stress on your lower back than standing or walking.   Use heat from a hot shower, hot bath, or heating pad to help ease pain. Massage can also help. You can also try using an ice pack. You can make your own ice pack by putting ice cubes in a plastic bag. Wrap the bag in a thin towel. Try both heat and cold to see which works best. Use the method that feels best for 20 minutes several times a day.   You may use acetaminophen or ibuprofen to ease pain, unless another pain medicine was prescribed. Note: If you have chronic liver or kidney disease, talk with your health care provider before taking these medicines. Also talk with your provider if you've had a stomach ulcer or GI bleeding.   Use safe lifting methods. Don't lift anything heavier than 15 pounds until all of the pain  is gone.  Follow-up care  Follow up with your health care provider if your symptoms don't start to get better after 1 week. You may need physical therapy or additional tests.  If X-rays were taken, they will be looked at by a radiologist. You will be told of any new findings that may affect your care.  When to seek medical advice  Call your health care provider right awayif any of these occur:   Pain gets worse even after taking prescribed medicine   Weakness or numbness in 1 or both legs or hips   Numbness in your groin or genital area   You can't control your bowel or bladder   Fever   Redness or swelling over your back or spine   2000-2015 [employer] [address], [place], [place] [id number]. All rights reserved.  This information is not intended as a substitute for professional medical care. Always follow your healthcare professional's instructions.            Understanding Sciatica  Sciatica is a type of leg pain. It is often due to pressure on a nerve in your low back that connects to the sciatic nerve. This pressure may be caused by a damaged disk or by abnormal bone growth. You may feel pain, burning, tingling, or numbness that shoots down your leg.  Pressure from a Damaged Disk  Constant wear and tear on a disk can cause it to weaken. Part of the disk may push outward (herniate). Part of the disk may then press on nearby nerves. If this nerve leads to the sciatic nerve, you may feel pain down your leg.   Pressure from Bone  A disk can wear out and thin with age. This can cause the vertebrae above and below the disk to touch, putting pressure on a nerve. Abnormal bone growths called bone spurs can also form where the bones rub together. These can narrow the spinal canal and press on nerves.     [address]2000-2015 [employer] [address], [place], [place] [id number]. All rights reserved. This information is not intended as a substitute for professional medical care. Always follow your healthcare professional's instructions.        Back Exercises: Seated Rotation      To start, sit in a chair with your feet flat on the floor. Shift your weight slightly forward to avoid rounding your back. Relax, and keep your ears, shoulders, and hips aligned.   Fold your arms and elbows just below shoulder height.   Turn from the waist with hips forward. Turn your head last. Do not push through the pain.   Hold for a count of5. Return to starting position.   Repeat5times on one side. Then switch sides.   [address]-2015 [employer] [address], [place], [place] [id number]. All rights reserved. This information is not intended as a substitute for professional medical care. Always follow your healthcare  professional's instructions.        Back Exercises: Side Stretch  To start, sit in a chair with your feet flat on the floor. Shift your weight slightly forward to avoid rounding your back. Relax. Keep your ears, shoulders, and hips aligned.   Stretch your right arm overhead.   Slowly bend to the left. Don't twist your torso. Stay within your pain limits.   Hold for20seconds. Return to starting position.   Repeat2times. Then, switch to the other side.   2000-2015 [employer] [address], [place], [place] [address]. All rights reserved. This information is not intended as a substitute for professional medical care. Always follow your healthcare professional's instructions.        Back Exercises: Lower Back Stretch     To start, sit in a chair with your feet flat on the floor. Shift your weight slightly forward to avoid rounding your back. Relax, and keep your ears, shoulders, and hips aligned:   Sit with your feet well apart.   Bend forward and touch the floor with the backs of your hands. Relax and let your body drop.   Hold for 20 seconds. Return to starting position.   Repeat2times.   [address] [employer] [address], [place], [place] [address]. All rights reserved. This information is not intended as a substitute for professional medical care. Always follow your healthcare professional's instructions.        Back Exercises: Lower Back Rotation    To start, lie on your back with your knees bent and feet flat on the floor. Don't press your neck or lower back to the floor. Breathe deeply. You should feel comfortable and relaxed in this position.   Drop both knees to one side. Turn your head to the other side. Keep your shoulders flat on the floor.   Do not push through pain.   Hold for20seconds.   Slowly switch sides.   Repeat2times.   [address] [employer] [address] [place], [place] [address]. All rights reserved. This information is  not intended as a substitute for professional medical care. Always follow your healthcare professional's instructions.        Back Exercises: Leg Reach     Do this exercise on your hands and knees. Keep your knees under your hips and your hands under your shoulders. Keep your spine in a neutral position (not arched or sagging). Be sure to maintain your neck's natural curve:   Extend one leg straight back. Don't arch your back or let your head or body sag.   Hold for5seconds. Return to starting position.   Repeat 5times.   Switch legs.   [address] [employer] [address], [place], [place] [address]. All rights reserved. This information is not intended as a substitute for professional medical care. Always follow your healthcare professional's instructions.        Back Exercises: Leg Pull     To start, lie on your back with your knees bent and feet flat on the floor. Don't press your neck or lower back to the floor. Breathe deeply. You should feel comfortable and relaxed in this position:   Pull one knee to your chest.   Hold for30 to 60 seconds. Return to starting position.   Repeat2times.   Switch legs.   For a double leg pull, pull both legs to your chest at the same time. Repeat2times.     [address] [employer] [address] [place], [place] [address]. All rights reserved. This information is not intended as a substitute for professional medical care. Always follow your healthcare professional's instructions.

## 2014-05-18 ENCOUNTER — Telehealth (INDEPENDENT_AMBULATORY_CARE_PROVIDER_SITE_OTHER): Payer: Self-pay | Admitting: Family Medicine

## 2014-05-18 NOTE — Telephone Encounter (Signed)
Called [employer] to start PA.  [phone]  Member ID: [id number]    Insurance will complete review and contact us  PA # [id number]

## 2014-05-30 NOTE — Telephone Encounter (Signed)
It has been 2 weeks since patient was seen.  Please let her know that her insurance won't cover the muscle relaxant prescribed by Dr. Louann LivFeliciano at her visit.  If she is still having pain, please have her schedule a follow-up appointment with Dr. Louann LivFeliciano to discuss next steps.  Thanks.

## 2014-05-30 NOTE — Telephone Encounter (Signed)
Received PA response from [employer].  PA request for Methocarbamol have been denied due to Pt not meeting non-formulary exception criteria. Pt needs to to try optimal dosing of 2 formulary alternatives for at least a period of 30 days each.  Alternatives include Chlorzoxazone, Cyclobenzaprine, and Orphenadrine ER.  Paperwork placed in Dr. Glean SalenFeliciano's in-box REVIEW folder.  Pt has tried Flexeril with no help (see LOV notes 11/19 from Pcp).    Dr. Louann LivFeliciano, please advise.

## 2014-05-31 NOTE — Telephone Encounter (Signed)
Called and spoke with patient, relayed message below regarding her muscle relaxant and that insurance will not cover it.  Offered to schedule her an OV to follow up. Patient stated she will call back in December after the holiday.

## 2014-06-09 ENCOUNTER — Encounter (INDEPENDENT_AMBULATORY_CARE_PROVIDER_SITE_OTHER): Payer: Self-pay | Admitting: Family Medicine

## 2014-06-11 ENCOUNTER — Ambulatory Visit: Payer: Self-pay

## 2014-06-11 NOTE — Telephone Encounter (Signed)
Annabelle Harmanana RN calling from [facility].  PT was seen on 06/07/14 and had stool culture done.  Culture is back and is growing Aeromonas Sobria.  Culture and sensitivity was also done.  ED physician will call pt in am and if she is still having diarrhea they will order Cipro.   Annabelle HarmanDana RN calling to let pts Dr Tonye RoyaltyVanessa Feliciano know about this.

## 2014-06-15 ENCOUNTER — Ambulatory Visit (INDEPENDENT_AMBULATORY_CARE_PROVIDER_SITE_OTHER): Payer: Medicare Other | Admitting: Family Medicine

## 2014-06-19 ENCOUNTER — Other Ambulatory Visit (HOSPITAL_BASED_OUTPATIENT_CLINIC_OR_DEPARTMENT_OTHER): Payer: Self-pay | Admitting: Registered Nurse

## 2014-06-19 ENCOUNTER — Encounter (HOSPITAL_BASED_OUTPATIENT_CLINIC_OR_DEPARTMENT_OTHER): Payer: Medicare Other | Admitting: Registered Nurse

## 2014-06-19 DIAGNOSIS — Z9884 Bariatric surgery status: Secondary | ICD-10-CM

## 2014-06-19 DIAGNOSIS — E559 Vitamin D deficiency, unspecified: Secondary | ICD-10-CM

## 2014-08-16 ENCOUNTER — Encounter (INDEPENDENT_AMBULATORY_CARE_PROVIDER_SITE_OTHER): Payer: Medicare Other | Admitting: Family Practice

## 2014-09-13 ENCOUNTER — Encounter (INDEPENDENT_AMBULATORY_CARE_PROVIDER_SITE_OTHER): Payer: Medicare Other | Admitting: Medical

## 2014-09-13 ENCOUNTER — Encounter (INDEPENDENT_AMBULATORY_CARE_PROVIDER_SITE_OTHER): Payer: Self-pay | Admitting: Medical

## 2014-09-13 NOTE — Progress Notes (Signed)
[name] did not cancel and was not present for a scheduled appointment today.  Disposition: Chart reviewed, letter sent regarding follow-up

## 2014-10-03 ENCOUNTER — Encounter (INDEPENDENT_AMBULATORY_CARE_PROVIDER_SITE_OTHER): Payer: Self-pay | Admitting: Family Medicine

## 2014-10-03 ENCOUNTER — Ambulatory Visit (INDEPENDENT_AMBULATORY_CARE_PROVIDER_SITE_OTHER): Payer: Medicare Other | Admitting: Family Medicine

## 2014-10-03 VITALS — BP 137/87 | HR 78 | Temp 97.8°F | Resp 18 | Wt 348.0 lb

## 2014-10-03 DIAGNOSIS — M79671 Pain in right foot: Secondary | ICD-10-CM

## 2014-10-03 DIAGNOSIS — Z9884 Bariatric surgery status: Secondary | ICD-10-CM

## 2014-10-03 DIAGNOSIS — F32A Depression, unspecified: Secondary | ICD-10-CM

## 2014-10-03 DIAGNOSIS — F419 Anxiety disorder, unspecified: Secondary | ICD-10-CM

## 2014-10-03 DIAGNOSIS — E034 Atrophy of thyroid (acquired): Secondary | ICD-10-CM

## 2014-10-03 DIAGNOSIS — F329 Major depressive disorder, single episode, unspecified: Secondary | ICD-10-CM

## 2014-10-03 DIAGNOSIS — E038 Other specified hypothyroidism: Secondary | ICD-10-CM

## 2014-10-03 MED ORDER — FLUOXETINE HCL 10 MG OR TABS
10.0000 mg | ORAL_TABLET | Freq: Every day | ORAL | Status: DC
Start: 2014-10-03 — End: 2015-04-02

## 2014-10-03 MED ORDER — BUPROPION HCL ER (XL) 150 MG OR TB24
150.0000 mg | EXTENDED_RELEASE_TABLET | Freq: Every day | ORAL | Status: DC
Start: 2014-10-03 — End: 2015-04-02

## 2014-10-03 MED ORDER — LEVOTHYROXINE SODIUM 25 MCG OR TABS
25.0000 ug | ORAL_TABLET | Freq: Every day | ORAL | Status: DC
Start: 2014-10-03 — End: 2017-01-14

## 2014-10-03 NOTE — Patient Instructions (Signed)
Decrease fluoxetine to 10 mg daily and start wellbutrin 150 mg.   Increase gabapentin by 300 mg every 3-4 days.   X-ray of the foot today.

## 2014-10-03 NOTE — Progress Notes (Signed)
SUBJECTIVE:  Sharon Stephens is a 39 year old female here to discuss multiple issues.    Has been on levothyroxine 0.025 mcg daily for two weeks since recently diagnosed with hypothyroidism.  She has been tolerating it well.  She needs a refill.    For depression and anxiety, she was started on fluoxetine recently.  She tolerated the 10 mg dose and increased it to 20 mg for the past 3 weeks.  However this dose upsets her stomach.  She denies any vomiting or diarrhea.    Additionally, she hurt her right foot against a door stop her.  She has pain on the distal part on top of the foot.  She denies any swelling.  She denies paresthesias.  She has some issues with pain on this foot last fall.  Those had resolved.      ROS:  Negative for SI or HI, paresthesias  Positive for as in HPI      I personally reviewed and confirmed the problem and med list in the record with the patient today.    No tobacco use.     OBJECTIVE:  BP 137/87 mmHg  Pulse 78  Temp(Src) 97.8 F (36.6 C) (Temporal)  Resp 18  Wt 348 lb (157.852 kg)  SpO2 98%  General: well appearing, in no distress  Right foot: No overlying skin changes, no deformities, she has tenderness to palpation over the distal second and third metatarsals.  She has full range of motion of the toes.  Neurovascular intact.  Psych: normal mood and affect      Right foot x-ray: no deformities or fractures, see official report in epic.     ASSESSMENT/PLAN:    1. Anxiety and depression  She is not tolerating the fluoxetine at 20 mg dose.  We discussed various treatment options.  We decided to keep her on the 10 mg fluoxetine dose but to start her on low-dose bupropion.  She's in agreement.  - FLUoxetine HCl 10 MG Oral Tab; Take 1 tablet (10 mg) by mouth daily.  Dispense: 30 tablet; Refill: 1  - BuPROPion HCl, XL, 150 MG Oral TABLET SR 24 HR; Take 1 tablet (150 mg) by mouth daily.  Dispense: 30 tablet; Refill: 1  - Levothyroxine Sodium 25 MCG Oral Tab; Take 1 tablet (25 mcg) by  mouth daily on an empty stomach.  Dispense: 30 tablet; Refill: 1    2. 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 Recommend RICE and monitoring for symptom improvement.   - X-RAY FOOT 3+ VW RIGHT    3. Hypothyroidism-plan to recheck in 6 weeks.

## 2014-10-03 NOTE — Progress Notes (Signed)
Reason for Visit: med review and R foot pain    Refills? YES  Referral? NO  Letter or Form? NO  Lab Results? NO    HEALTH MAINTENANCE:  Has the patient had this done since their last visit?  Cervical screening/PAP: NO, Pt due now, needs to schedule Gyn exam with Pcp  Mammo: No, not due  Colon Screen: No, not due    Have you seen a specialist since your last visit: No    Vaccines Due? No    HM Due:   Health Maintenance   Topic Date Due   . HIV Screen  05/13/78   . Pap Smear  11/11/2013   . Tetanus Vaccine  05/05/2019   . Influenza Vaccine  Completed       PCP Verified?  Yes, Dr. Louann LivFeliciano    Per orders of Dr. Louann LivFeliciano, injection of Cyanocobalamin (Vitamin B12) given IM by Denice ParadiseNinh Jasmeen Fritsch, MA.  Patient tolerates well with no adverse reaction.

## 2014-10-04 ENCOUNTER — Ambulatory Visit (HOSPITAL_BASED_OUTPATIENT_CLINIC_OR_DEPARTMENT_OTHER): Payer: Medicare Other | Attending: Physical Medicine & Rehabilitation | Admitting: Physical Medicine & Rehabilitation

## 2014-10-04 ENCOUNTER — Ambulatory Visit (HOSPITAL_BASED_OUTPATIENT_CLINIC_OR_DEPARTMENT_OTHER): Payer: Medicare Other | Admitting: Counselor

## 2014-10-04 ENCOUNTER — Ambulatory Visit (HOSPITAL_BASED_OUTPATIENT_CLINIC_OR_DEPARTMENT_OTHER): Payer: Medicare Other | Admitting: Addiction (Substance Use Disorder)

## 2014-10-04 ENCOUNTER — Encounter (HOSPITAL_BASED_OUTPATIENT_CLINIC_OR_DEPARTMENT_OTHER): Payer: Self-pay | Admitting: Physical Medicine & Rehabilitation

## 2014-10-04 VITALS — BP 141/78 | HR 82 | Wt 346.1 lb

## 2014-10-04 DIAGNOSIS — M722 Plantar fascial fibromatosis: Secondary | ICD-10-CM | POA: Insufficient documentation

## 2014-10-04 NOTE — Progress Notes (Signed)
[facility], has also a court order for an assessment.  Pt scheduled for an assessment.

## 2014-10-06 NOTE — Progress Notes (Signed)
Pershing ProudMAYES, Sharon Stephens          [id number]          10/04/2014      [facility]    HISTORY     This is a 39 year old woman who presents today for reevaluation regarding recurring right foot pain.  Her last visit was in October 2015.  She had undergone a right plantar fascia injections under ultrasound guidance one week prior.  Her symptoms were aggravated at that time.  She subsequently indicates that her pain did improve.  She states she had been doing fairly well for several months.    Over the past three weeks she has had recurrence of pain in her right heel and arch.  Several days ago she stepped on a firm object and has had midfoot pain.  She did undergo a recent x-ray.    She has been wearing the CAM walker boot at bedtime.  She has not been using this much during the day.    She has had no other formal treatment.    EXAMINATION     VITAL SIGNS:  BP 141/78, pulse 82 regular, weight 346 pounds.  MUSCULOSKELETAL:  Right foot tenderness in the plantar aspect of the calcaneus and along the medial calcaneus and arch.  Also tenderness over the lateral calcaneus and at the calcaneocuboid junction.  Mild pain with metatarsal compression.  No redness or swelling.  NEUROLOGIC:  No weakness or numbness.    IMAGING     Reviewed were right foot x-rays 10/03/2014.  There are enthesophytes at the inferior and superior calcaneus.  There is moderate degenerative change at the calcaneocuboid and at the navicular metatarsal junction.    DISCUSSION     This is a 39 year old woman who has chronic recurring right foot pain.  She has been diagnosed with plantar fasciitis in the past.  Her symptoms have features of plantar fasciitis.  There may also be symptomatic osteoarthritis.  Her weight is a contributing factor.    PLAN     We reviewed her situation thoroughly.  I suggested she use the CAM walker boot for one to two weeks.  If her condition has not improved then I would consider possibly a repeat corticosteroid injection.   We discussed the importance of continued weight loss.  At this point there is not a clear surgical option for her foot pain.  No medications prescribed today.  I have asked her to report back to me in the next several weeks for reevaluation.    Today's visit was 15 minutes, greater than 50% of which was counseling and coordination of care.

## 2014-10-13 ENCOUNTER — Encounter (INDEPENDENT_AMBULATORY_CARE_PROVIDER_SITE_OTHER): Payer: Self-pay | Admitting: Family Medicine

## 2014-10-17 ENCOUNTER — Encounter (INDEPENDENT_AMBULATORY_CARE_PROVIDER_SITE_OTHER): Payer: Self-pay

## 2014-10-23 ENCOUNTER — Encounter (HOSPITAL_BASED_OUTPATIENT_CLINIC_OR_DEPARTMENT_OTHER): Payer: Medicare Other | Admitting: Addiction (Substance Use Disorder)

## 2014-11-06 ENCOUNTER — Encounter (INDEPENDENT_AMBULATORY_CARE_PROVIDER_SITE_OTHER): Payer: Self-pay

## 2014-12-11 ENCOUNTER — Encounter (INDEPENDENT_AMBULATORY_CARE_PROVIDER_SITE_OTHER): Payer: Self-pay | Admitting: Family Medicine

## 2014-12-18 ENCOUNTER — Telehealth (HOSPITAL_BASED_OUTPATIENT_CLINIC_OR_DEPARTMENT_OTHER): Payer: Self-pay | Admitting: Sports Medicine

## 2014-12-18 NOTE — Telephone Encounter (Signed)
Patient would like to return to clinic for a repeat foot injection. Patient is saying that the pain in both feet.  On 04/17/2014 patient had a Right ultrasound guided plantar fascia steroid injection and on 02/27/14 for a Left plantar fascia injection, both with Dr. Clydie Braun. Patient was last in clinic on 10/04/2014 for a follow up in clinic. Patient says that the pain in the right is a 9/10 and the left is 7/10. The pain started to return in mid February, patient also noted that it is the same pain. For the injections she got almost 100% relief for both feet. Patient has been in [place] for the past 2 months due to death of mother. Please let me know if you would like to see the patient in clinic or to schedule a return injection.

## 2014-12-19 NOTE — Telephone Encounter (Signed)
She needs an office evaluation.      SW

## 2014-12-19 NOTE — Telephone Encounter (Signed)
Left message for patient to call [phone] or [phone] to schedule a clinic follow up appointment first.

## 2014-12-25 ENCOUNTER — Ambulatory Visit (HOSPITAL_BASED_OUTPATIENT_CLINIC_OR_DEPARTMENT_OTHER): Payer: Medicare Other | Attending: Physical Medicine & Rehabilitation | Admitting: Physical Medicine & Rehabilitation

## 2014-12-25 ENCOUNTER — Encounter (HOSPITAL_BASED_OUTPATIENT_CLINIC_OR_DEPARTMENT_OTHER): Payer: Self-pay | Admitting: Physical Medicine & Rehabilitation

## 2014-12-25 VITALS — BP 131/75 | HR 84 | Wt 347.6 lb

## 2014-12-25 DIAGNOSIS — M722 Plantar fascial fibromatosis: Secondary | ICD-10-CM | POA: Insufficient documentation

## 2014-12-27 NOTE — Progress Notes (Signed)
Sharon Stephens  MRN [id number]    December 25, 2014    HISTORY: This is a 39 year old woman who presents today for reevaluation regarding right greater than left heel pain. Her last office visit was in March 2016. At that time, her primary complaint was right foot pain. I suggested use of a CAM walker boot for several weeks. She states that she did use this. She had to travel out of state because of a family emergency. She has recently returned. She is describing increasing and currently severe right heel pain mostly in the midline and more lateral than medial. She does have some pain in the arch. She has moderate to severe pain in her left foot as well. Her symptoms are worse with walking, but she states her pain at this point is constant.     She has been using ibuprofen in varying doses. She has had no other specific treatment.     PHYSICAL EXAMINATION:  There is right greater than left calcaneal tenderness on the midline of the calcaneus as well as more lateral than medial. There is generalized arch tenderness.     Weight: 348 pounds.     Neurovascular examination is normal.     DISCUSSION: This is a 39 year old woman who has a longstanding history of bilateral foot pain. She has a previous MRI of her left foot revealing findings consistent with plantar fasciitis. There are right foot x-rays from March 2016 which reveals degenerative changes involving the calcaneal cuboid as well as tarsal metatarsal joints. Her ongoing foot pain could be consistent with chronic plantar fasciopathy. There may be a component of degenerative joint disease. Less likely would be stress fracture. Her symptoms are contributed to by her obesity, although she has lost 250 pounds.     PLAN: We discussed her situation thoroughly. I have recommended a right foot MRI to evaluate for a stress fracture as well as signal change within the plantar fascia as well as inflammatory changes in the subtalar joints. She states that she would like to  have another corticosteroid injection. In the past two years, she has had two right and one left ultrasound-guided heel injections. These have provided some benefit. I explained to her that I am reluctant to continue with corticosteroid injections in the plantar fascia, but I would potentially consider one more injection. I would also consider a subtalar joint injection based on the MRI findings. I explained to her that ideally she will continue to lose substantial weight. She states that she would like to have additional surgery to address this. She is working with her providers toward that goal. She requested medication. I explained to her that I will not prescribe opioid medications. We did discuss appropriate dosing of ibuprofen and acetaminophen and I have also suggested she consider glucosamine/chondroitin. She is frustrated that I will not prescribe opioid medication, but I explained to her that I am not willing to start the process of opioid medication for chronic pain. She will proceed with the MRI and contact me after this for reevaluation.     Today's visit was 15 minutes, greater than 50 percent of which was counseling and coordination of care.     Signature Line  Electronically Reviewed/Signed  _____________________________________  Graylin Shiver. Blaine Hamper, MD  Clinical Professor and Attending   [facility]  Box [phone]  [place], [place] [address]    SMW/eh

## 2015-01-02 ENCOUNTER — Ambulatory Visit (INDEPENDENT_AMBULATORY_CARE_PROVIDER_SITE_OTHER): Payer: Medicare Other | Admitting: Family Medicine

## 2015-01-09 ENCOUNTER — Telehealth (HOSPITAL_BASED_OUTPATIENT_CLINIC_OR_DEPARTMENT_OTHER): Payer: Self-pay | Admitting: Physical Medicine & Rehabilitation

## 2015-01-09 NOTE — Telephone Encounter (Signed)
CONFIRMED PHONE NUMBER: [phone]  CALLERS FIRST AND LAST NAME: [name]  FACILITY NAME: na TITLE: na  CALLERS RELATIONSHIP:Self  RETURN CALL: Detailed message on voicemail only     [facility] ONLY) Texting is an option for this clinic. If you would like us to use this option, which mobile phone number should we text to if we are unable to reach you?    SUBJECT: General Message   REASON FOR REQUEST: results request    MESSAGE: Patient would like MRI results for right foot. [facility]. Please call with test results. Thank you.

## 2015-01-10 ENCOUNTER — Encounter (INDEPENDENT_AMBULATORY_CARE_PROVIDER_SITE_OTHER): Payer: Self-pay | Admitting: Medical

## 2015-01-10 ENCOUNTER — Ambulatory Visit (INDEPENDENT_AMBULATORY_CARE_PROVIDER_SITE_OTHER): Payer: Medicare Other | Admitting: Medical

## 2015-01-10 ENCOUNTER — Inpatient Hospital Stay: Payer: Self-pay

## 2015-01-10 VITALS — BP 131/90 | HR 87 | Temp 97.2°F | Resp 24 | Wt 352.4 lb

## 2015-01-10 DIAGNOSIS — Z9884 Bariatric surgery status: Secondary | ICD-10-CM

## 2015-01-10 DIAGNOSIS — E538 Deficiency of other specified B group vitamins: Secondary | ICD-10-CM

## 2015-01-10 DIAGNOSIS — B373 Candidiasis of vulva and vagina: Secondary | ICD-10-CM

## 2015-01-10 DIAGNOSIS — M722 Plantar fascial fibromatosis: Secondary | ICD-10-CM

## 2015-01-10 DIAGNOSIS — M7661 Achilles tendinitis, right leg: Secondary | ICD-10-CM

## 2015-01-10 DIAGNOSIS — B3731 Acute candidiasis of vulva and vagina: Secondary | ICD-10-CM

## 2015-01-10 DIAGNOSIS — M19071 Primary osteoarthritis, right ankle and foot: Secondary | ICD-10-CM

## 2015-01-10 MED ORDER — ACETAMINOPHEN-CODEINE #3 300-30 MG OR TABS
1.0000 | ORAL_TABLET | ORAL | Status: DC | PRN
Start: 2015-01-10 — End: 2015-03-27

## 2015-01-10 MED ORDER — FLUCONAZOLE 150 MG OR TABS
150.0000 mg | ORAL_TABLET | Freq: Once | ORAL | Status: AC
Start: 2015-01-10 — End: 2015-01-10

## 2015-01-10 MED ORDER — CYANOCOBALAMIN 1000 MCG/ML IJ SOLN
1000.0000 ug | Freq: Once | INTRAMUSCULAR | Status: AC
Start: 2015-01-10 — End: 2015-01-10
  Administered 2015-01-10: 1000 ug via INTRAMUSCULAR
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
## 2015-01-10 NOTE — Patient Instructions (Signed)
At [facility], we are dedicated to providing high quality health care. You may receive an evaluation in the mail about your recent visit. Please take a few moments to fill out the survey. Your feedback, both positive and negative, is very important to help us provide you the best care possible and to consistently make improvements. Thank you in advance for taking the time to respond. -Misty Foutz

## 2015-01-10 NOTE — Progress Notes (Signed)
Reason for Visit: foot pain    Refills? NO  Referral? NO  Letter or Form? NO  Lab Results? NO    HEALTH MAINTENANCE:  Has the patient had this done since their last visit?  Cervical screening/PAP: N/A  Mammo: N/A  Colon Screen: N/A    Have you seen a specialist since your last visit: No    Vaccines Due? No    HM Due:   Health Maintenance   Topic Date Due   . HIV Screen  09-03-77   . Pap Smear  11/11/2013   . Influenza Vaccine (1) 03/08/2015   . Tetanus Vaccine  05/05/2019       PCP Verified?  Yes

## 2015-01-11 ENCOUNTER — Telehealth (INDEPENDENT_AMBULATORY_CARE_PROVIDER_SITE_OTHER): Payer: Self-pay | Admitting: Medical

## 2015-01-11 DIAGNOSIS — E538 Deficiency of other specified B group vitamins: Secondary | ICD-10-CM

## 2015-01-11 NOTE — Telephone Encounter (Signed)
Hello,    Patient did not present to lab for FOLB12. Orders have been cancelled. Please place future orders for required testing.     Thank you,   Lab Staff

## 2015-01-11 NOTE — Telephone Encounter (Signed)
Done, closing

## 2015-01-15 ENCOUNTER — Encounter (INDEPENDENT_AMBULATORY_CARE_PROVIDER_SITE_OTHER): Payer: Self-pay | Admitting: Unknown Physician Specialty

## 2015-01-15 ENCOUNTER — Encounter (INDEPENDENT_AMBULATORY_CARE_PROVIDER_SITE_OTHER): Payer: Medicare Other | Admitting: Unknown Physician Specialty

## 2015-01-15 NOTE — Progress Notes (Signed)
PATIENT WAS NO SHOW      The following information was reviewed based on chart review.     Sharon Stephens is a 39 year old female scheduled today for a preventive health visit and Mirena IUD removal with replacement.    Last gynecological appointment recorded was a 2011 [facility] Appointment:    She is a G2, P2, with a past medical history significant for morbid obesity. Status post BTL with Mirena IUD in place.    Last PAP done in 2012 (two specimen with surgical path documented) and was HPV positive:    1) 12/19/10 FINAL DIAGNOSIS:  A)  Endocervix, curettage: Scant fragments of   endocervical and squamous epithelium. If the specimen is representative, no dysplasia is   Identified.    SPECIMEN SOURCE:   11/12/10  A) Endo/ecto cervical  CYTOLOGIC IMPRESSION:  Atypical squamous cells of undetermined significance   (ASC-US). SAMPLING ADEQUACY:  Endocervical cells / metaplastic cells absent. Sample   may not be representative of the transformation zone.    Previously on a on a q-three year schedule for a history of ASCUS. She does not need to have a speculum exam today. She will followup in November for her annual well woman exam.      Need to clarify when last IUD was placed and whether she has gotten a colpo after her last PAP.

## 2015-01-15 NOTE — Addendum Note (Signed)
Addended by: Oda KiltsUMOND, Carollynn Pennywell ELAINE on: 01/15/2015 04:50 PM     Modules accepted: Orders

## 2015-01-15 NOTE — Patient Instructions (Signed)
Recommendations for Pap Smear screening age 37-65    . Pap smear every 3 years OR pap smear with HPV screening every 5 years  o Exceptions:   - if you have ever been treated for moderate or severe dysplasia of the cervix and have completed post-treatment surveillance, you can return to routine screening for your age group, and continue Pap screening for at least twenty years.  - If you have a suppressed immune system you may need more frequent screening.  . Discontinue screening at age 65 if there have been three consecutive normal Pap smears or negative HPV test.  . If you are sexually active, you should have a yearly screening for Chlamydia, HIV, and other sexually transmitted infections as appropriate  . If you are using contraception, you should see your provider yearly for symptom review, blood pressure check (if using a hormonal method), and exam if needed.  Leading a Healthy Life  Six tips to help improve your health and wellness     This explains how these 6 basic guidelines may improve your health and wellness:   Eat well to give your body the energy it needs.   Stay or get active.   A healthy mind is part of a healthy body.   Practice safe living habits.   Keep your mind and body free of harmful drugs and alcohol.   Get regular health care.     Tip #1: Eat well to give your body the energy it needs.   Your body needs nutritious foods to stay strong and healthy.   Here are some general eating guidelines:   Have 2 servings of fish or other seafood 2 times a week (1 serving = 4 ounces).   If you eat dairy products, choose low-fat (1%) or nonfat ones.   If you eat meat, cut down on the amount. Replace it with plant-based foods such as beans, whole grains, fruits and vegetables, and nuts and seeds.   Have less than 1,500 mg of sodium (salt) a day.   Cut down on "junk food" like alcohol, fatty foods, chips, candy, and other sweets.     Tip #2: Stay or get active.   Exercise for at least 30 minutes at a time, 3  times a week. Regular physical activity can help you:   Live longer and feel better   Be stronger and more flexible   Build strong bones   Prevent depression   Strengthen your immune system   Maintain a healthy body weight     Tip #3: Remember: A healthy mind is part of a healthy body.   A good state of mind can help you make healthy choices. Here are a few tips for keeping your mind healthy:   Reduce stress in your life.   Make some time every day for things that are fun.   Get enough sleep. Lack of sleep reduces how well you can concentrate, increases mood swings, and raises your risk of having a car accident.   Ask your health care provider for help if you feel depressed or anxious for more than several days in a row.     Tip #4: Practice safe living habits.   Accidents and Injuries     Accidents and injuries are the 5th leading cause of death in the [place]   Women under age 35 are more likely to die in motor vehicle accidents than from any other cause.   Accidents in the home cause thousands   of permanent injuries every year.     The most common accidents are fires, falls, and drowning. To help yourself and your family stay safe:   Install smoke detectors on each floor of your home.   Make sure everyone in your family knows how to swim  Stay safe on the road:  Wear a seatbelt.   Do not ride with someone who has been drinking or taking drugs.   Do not speak on a cell phone or send, read, or write text messages while you are driving.   Wear a helmet when you ride a bicycle or motorcycle.   Get enough sleep at night, and do not drive when you are tired.     Hand Hygiene   Protect yourself from germs by washing your hands often. Always wash your hands:   After you change a diaper or use the toilet   Before you start and after you finish preparing food     Tip #5: Keep your mind and body free of harmful drugs and alcohol.   Tobacco causes more health problems than any other substance. These problems include lung  disease, heart disease, and many types of cancer. The nicotine in tobacco is the most addictive and widely used drug.   Too much alcohol can cause damage to your liver, heart, brain, bones, and other body tissues. Being under the influence of alcohol also increases your chance of being injured in an accident.    Alcohol can cause fetal alcohol syndrome in your children if you drink regularly when you are pregnant.   Street drugs, like marijuana, cocaine, methamphetamine, heroin, or pain pills not prescribed by your doctor can harm your health. They may be mixed with harmful substances, and using them can cause people to put themselves in dangerous situations.     Tip #6: Get regular health care.   Many people think they need to see the doctor only when they are sick. But, health care providers can also help you stay healthy.   Find a health care provider who works with you to manage your health.   Ask your health care provider what diseases you are at risk for. Learn what you can do to prevent or control them.   Get yourself and your family immunized against life-threatening diseases.

## 2015-01-17 ENCOUNTER — Other Ambulatory Visit (HOSPITAL_BASED_OUTPATIENT_CLINIC_OR_DEPARTMENT_OTHER): Payer: Self-pay | Admitting: Physical Medicine & Rehabilitation

## 2015-01-17 ENCOUNTER — Inpatient Hospital Stay: Payer: Self-pay

## 2015-01-17 DIAGNOSIS — M722 Plantar fascial fibromatosis: Secondary | ICD-10-CM

## 2015-01-18 ENCOUNTER — Encounter (INDEPENDENT_AMBULATORY_CARE_PROVIDER_SITE_OTHER): Payer: Self-pay | Admitting: Family Medicine

## 2015-01-23 NOTE — Telephone Encounter (Signed)
Called patient to schedule US guided plantar fascia injection vs subtalar (tala-calcaneal) joint injection. I left a voicemail for patient to call [phone] or [phone]

## 2015-01-25 ENCOUNTER — Telehealth (HOSPITAL_BASED_OUTPATIENT_CLINIC_OR_DEPARTMENT_OTHER): Payer: Self-pay | Admitting: Sports Medicine

## 2015-01-25 NOTE — Telephone Encounter (Signed)
Patent is scheduled for US guided right plantar fascia injection vs subtalar (tala-calcaneal) joint injection with you on 02/05/2015. Patient would like to know if you can do both feet if possible?

## 2015-01-26 ENCOUNTER — Encounter (INDEPENDENT_AMBULATORY_CARE_PROVIDER_SITE_OTHER): Payer: Self-pay | Admitting: Family Medicine

## 2015-01-26 ENCOUNTER — Emergency Department
Admission: EM | Admit: 2015-01-26 | Discharge: 2015-01-27 | Disposition: A | Discharge status: Home / Self Care | Payer: Medicare Other | Attending: Emergency Medicine | Admitting: Emergency Medicine

## 2015-01-26 DIAGNOSIS — J9811 Atelectasis: Secondary | ICD-10-CM | POA: Insufficient documentation

## 2015-01-26 DIAGNOSIS — S60051A Contusion of right little finger without damage to nail, initial encounter: Secondary | ICD-10-CM

## 2015-01-26 DIAGNOSIS — S0083XA Contusion of other part of head, initial encounter: Secondary | ICD-10-CM | POA: Insufficient documentation

## 2015-01-26 DIAGNOSIS — S60031A Contusion of right middle finger without damage to nail, initial encounter: Secondary | ICD-10-CM

## 2015-01-26 DIAGNOSIS — S60041A Contusion of right ring finger without damage to nail, initial encounter: Secondary | ICD-10-CM | POA: Insufficient documentation

## 2015-01-26 DIAGNOSIS — S20222A Contusion of left back wall of thorax, initial encounter: Secondary | ICD-10-CM | POA: Insufficient documentation

## 2015-01-26 DIAGNOSIS — S60021A Contusion of right index finger without damage to nail, initial encounter: Secondary | ICD-10-CM | POA: Insufficient documentation

## 2015-01-26 DIAGNOSIS — S20211A Contusion of right front wall of thorax, initial encounter: Secondary | ICD-10-CM | POA: Insufficient documentation

## 2015-01-26 DIAGNOSIS — S0003XA Contusion of scalp, initial encounter: Secondary | ICD-10-CM | POA: Insufficient documentation

## 2015-01-26 DIAGNOSIS — I771 Stricture of artery: Secondary | ICD-10-CM | POA: Insufficient documentation

## 2015-01-26 DIAGNOSIS — S60011A Contusion of right thumb without damage to nail, initial encounter: Secondary | ICD-10-CM | POA: Insufficient documentation

## 2015-01-26 DIAGNOSIS — M79642 Pain in left hand: Secondary | ICD-10-CM | POA: Insufficient documentation

## 2015-01-26 NOTE — Telephone Encounter (Signed)
We can only do the right side during this visit.

## 2015-01-27 ENCOUNTER — Other Ambulatory Visit: Payer: Self-pay | Admitting: Emergency Medicine

## 2015-01-27 DIAGNOSIS — M79642 Pain in left hand: Secondary | ICD-10-CM

## 2015-01-27 DIAGNOSIS — J9811 Atelectasis: Secondary | ICD-10-CM

## 2015-01-27 DIAGNOSIS — S20222A Contusion of left back wall of thorax, initial encounter: Secondary | ICD-10-CM

## 2015-01-27 DIAGNOSIS — I771 Stricture of artery: Secondary | ICD-10-CM

## 2015-01-30 NOTE — Telephone Encounter (Signed)
Called patient and left a voicemail about the right side.

## 2015-02-05 ENCOUNTER — Ambulatory Visit (HOSPITAL_BASED_OUTPATIENT_CLINIC_OR_DEPARTMENT_OTHER): Payer: Medicare Other | Attending: Sports Medicine | Admitting: Sports Medicine

## 2015-02-05 ENCOUNTER — Encounter (HOSPITAL_BASED_OUTPATIENT_CLINIC_OR_DEPARTMENT_OTHER): Payer: Self-pay | Admitting: Sports Medicine

## 2015-02-05 VITALS — BP 144/76 | Ht 66.14 in | Wt 346.0 lb

## 2015-02-05 DIAGNOSIS — M722 Plantar fascial fibromatosis: Secondary | ICD-10-CM | POA: Insufficient documentation

## 2015-02-05 NOTE — Progress Notes (Signed)
Specializing in sports-related injuries and non-surgical care for conditions of the spine,  shoulder, elbow, wrist, hand, hip, knee, foot, and ankle.      Sharon Stephens  [id number]    Referral source: Dr. Blaine Hamper     Procedure note:   Chief complaint: US-guided right plantar fascia injection    History of present illness:   Sharon Stephens is a 39 year old female who presents for an ultrasound guided right foot injection. The patient was last seen by me on 04/17/14 for a right plantar fascia injection. The patient experienced several months pain relief from her previous injection. Her symptoms are again back to baseline and limiting her ability to ambulate. She notes pain in the right medial heel region. Today in clinic we discussed her MRI findings and discussed a possible plantar fascia vs subtalar steroid injection. The patient wishes to have the same injection as last time. Limited clinical exam today reveals direct reproduction of her typical pain with palpation over the medial heel. Today we elected to perform the procedure on the right (plantar fascia injection). We again discussed the importance of a weight loss management and ankle/foot strengthening program.       Medications and Allergies:   Medications and allergies were reviewed with the patient. No contraindications were identified.     Consent: Potential risks including pain, serious infection, bleeding, nerve injury, allergic reaction, and others were discussed with the patient. The patient understands the benefits, risks, and alternatives of the procedure and agrees to the procedure today. Written consent was obtained.    Procedural details:  Using ultrasound, a prescan of the region was performed to identify the target structure.      The procedure was carried out under sterile technique.    Transducer: A 12-3 mHz linear transducer.   Patient position: Prone with the right ankle hanging off of the table.   Localization process: The plantar fascia  was localized in a short axis view at the level of the distal medial calcaneous.   Local anesthesia: No local anesthesia was used.   Needle: A 25 gauge 1.5 inch needle was used for the injection.   Approach: A medial to lateral, in plane, approach was used to guide the needle tip in the plane between the superficial most fibers of the plantar fascia and the overlying fat pad.   Injection/Aspiration: A mixture of 1cc of 1% lidocaine and 1 cc of triamcinolone ( /cc) was injected without complication. The fat pad was visualized lifting off of the plantar fascia as the medication was injected.   Post-procedural care: The patient tolerated the procedure well. The patient was asked to ice for improved pain control and avoid submerging the area in water for the next 24 hours to help reduce the risk of infection. The patient was instructed to call the office immediately if there are any questions or concerns.      Plan:  1. The patient will follow up with Dr. Blaine Hamper as needed.  2. All questions answered today in clinic.     Otho Perl, M.D.   Assistant Professor and Attending

## 2015-02-09 NOTE — Progress Notes (Signed)
No show

## 2015-02-20 ENCOUNTER — Inpatient Hospital Stay: Payer: Self-pay

## 2015-03-13 ENCOUNTER — Encounter (INDEPENDENT_AMBULATORY_CARE_PROVIDER_SITE_OTHER): Payer: Self-pay | Admitting: Family Medicine

## 2015-03-13 ENCOUNTER — Ambulatory Visit (INDEPENDENT_AMBULATORY_CARE_PROVIDER_SITE_OTHER): Payer: Medicare Other | Admitting: Family Medicine

## 2015-03-13 VITALS — BP 134/90 | HR 57 | Temp 97.6°F | Resp 14 | Wt 359.0 lb

## 2015-03-13 DIAGNOSIS — S76011A Strain of muscle, fascia and tendon of right hip, initial encounter: Secondary | ICD-10-CM

## 2015-03-13 NOTE — Patient Instructions (Signed)
Hip Strain    You have a strain of the muscles around the hip joint. A muscle strain is a stretching or tearing of muscle fibers. This causes pain, especially when you move that muscle. There may also be some swelling and bruising.  Home care   Stay off the injured leg as much as possible until you can walk on it without pain. If you have a lot of pain with walking, crutches or a walker may be prescribed. These can be rented or purchased at many pharmacies and surgical or orthopedic supply stores. Follow your healthcare provider's advice regarding when to begin putting weight on that leg.   Apply an ice pack over the injured area for 15 to 20 minutes every3 to 6hours. You should do this forthe first24 to 48 hours.You can make an ice pack by filling a plastic bag that seals at the top with ice cubes and then wrapping it with a thin towel. Be careful not to injure your skin with the ice treatments. Ice should never be applied directly to skin. Continue the use of ice packs for relief of pain and swelling as needed. After 48 hours, apply heat(warm shower orwarm bath)for 15 to 20 minutes several times a day, or alternate ice and heat.   You may useover-the-counter pain medicineto control pain, unless another pain medicine was prescribed. If you have chronic liver or kidney disease or ever had a stomach ulcer or GI bleeding, talk with your healthcare providerbeforeusing these medicines.   If you play sports, you may resume these activities when you are able to hop and run on the injured leg without pain.  Follow-up care  Follow up with yourhealthcare provider, or as advised. If your symptoms do not begin to get better after a week, more tests may be needed.  If X-rays were taken,you will be told of any new findings that may affect your care.  When to seek medical advice  Call your healthcare provider right away if any of these occur:   Increased swelling or bruising   Increasedpain   Losing the  ability to put weight on the injured side   2000-2015 [employer] [address], [place] [address]. All rights reserved. This information is not intended as a substitute for professional medical care. Always follow your healthcare professional's instructions.

## 2015-03-13 NOTE — Progress Notes (Signed)
Chief Complaint   Patient presents with   . Hip Pain     Pt c/o of hip pain on the right side, pt went hiking yesterday and pain started yesterday while she was hiking.        HPI  Sharon Stephens is a 39 year old female who presents with complaint of right hip pain which started with hiking yesterday when stepping on a decent sized rock which cause her to twist her right leg. Happened early on in the hike but continued to hike approx 4-53miles. First time she has been hiking. Was wearing fairly new tennis shoes. Pain is localized to the outer hip and achy in nature. 5/10 at rest and 8/10 with movement. Denies numbness, weakness, swelling, increased warmth, redness, radiating symptoms. Hx of left hip issue in May of this year after having a fall. No fractures. Resolved on it's own and has had no problems since that time. Tried heat, ice, motrin, flexeril both last night and this morning but states not helping. Admits to not stretching before or after the hike. Chronic bilateral plantar fascitis and states she had steroid injection in the foot a week or so ago. Denies foot pain currently. Also denies knee or ankle pain.     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 Walks regularly around the block a few times. 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      Outpatient Prescriptions Prior to Visit   Medication Sig Dispense Refill   . Acetaminophen 500 MG Oral Tab Take 1 tablet (500 mg) by mouth every 4 hours as needed for pain. 60 tablet 2   . Acetaminophen-Codeine #3 (TYLENOL WITH CODEINE #3) 300-30 MG Oral Tab Take 1-2 tablets by mouth every 4 hours as needed for pain. 10 tablet 0   . Albuterol Sulfate HFA 108 (90 BASE) MCG/ACT Inhalation Aero Soln Inhale 1-2 puffs by mouth every 4 hours as needed for shortness of breath/wheezing. For shortness of breath. 1 Inhaler 0   . BuPROPion HCl, XL, 150 MG Oral TABLET SR 24 HR Take 1 tablet  (150 mg) by mouth daily. 30 tablet 1   . Cholecalciferol (VITAMIN D) 1000 UNITS Oral Tab TAKE 1 TABLET DAILY     . Diclofenac Sodium 1 % Transdermal Gel Apply 1 g topically 4 times a day. Apply to area of pain for plantar fasciitis. 1 Tube 1   . FLUoxetine HCl 10 MG Oral Tab Take 1 tablet (10 mg) by mouth daily. 30 tablet 1   . Fluticasone Propionate 50 MCG/ACT Nasal Suspension Spray 2 sprays into each nostril daily. 1 Inhaler 0   . Levonorgestrel 20 MCG/24HR Intrauterine IUD as directed     . Levothyroxine Sodium 25 MCG Oral Tab Take 1 tablet (25 mcg) by mouth daily on an empty stomach. 30 tablet 1   . Meloxicam 15 MG Oral Tab Take 1 tablet (15 mg) by mouth daily. 30 tablet 1   . Methocarbamol 750 MG Oral Tab Take 1-2 tablets (750-1,500 mg) by mouth 3 times a day as needed for muscle spasms. 60 tablet 1   . Multiple Vitamin (MULTI VITAMIN MENS OR) Once daily     . Mupirocin (BACTROBAN) 2 % External Ointment Apply 1 application topically 3 times a day. Apply to neck lesion. 1 Tube 1   . Triamcinolone Acetonide 0.1 % External Ointment Apply 1 application topically  2 times a day. Apply to affected skin in neck. 1 Tube 1     No facility-administered medications prior to visit.       Review of patient's allergies indicates:  Allergies   Allergen Reactions   . Amoxicillin      Other reaction(s): Undetermined  Unsure from childhood    . Penicillin G    . Penicillins      Other reaction(s): Undetermined  Unsure from childhood        Past Medical History   Diagnosis Date   . Depressive disorder, not elsewhere classified    . Unspecified sleep apnea    . Hernia of other specified sites of abdominal cavity without mention of obstruction or gangrene      notes having 3 in abdomen    . Allergic rhinitis, cause unspecified    . Chronic obstructive asthma, unspecified    . Type II or unspecified type diabetes mellitus without mention of complication, not stated as uncontrolled      broaderline    . Esophageal reflux    .  Essential hypertension, benign    . Morbid obesity (HCC)    . Generalized osteoarthrosis, unspecified site        Social History   Substance Use Topics   . Smoking status: Never Smoker    . Smokeless tobacco: Never Used   . Alcohol Use: No         ROS  Review of Systems   Constitutional: Positive for activity change. Negative for diaphoresis.   Cardiovascular: Negative for leg swelling.   Musculoskeletal: Positive for gait problem. Negative for back pain and joint swelling.   Skin: Negative for color change and wound.   Neurological: Negative for weakness and numbness.   Psychiatric/Behavioral: Positive for sleep disturbance (unable to get comfortable).         PHYSICAL EXAM  BP 134/90 mmHg  Pulse 57  Temp(Src) 97.6 F (36.4 C) (Temporal)  Resp 14  Wt 359 lb (162.841 kg)  SpO2 99%    Physical Exam   Constitutional: She appears well-developed and well-nourished. No distress.   Morbidly obese   Neurological: She is alert. Gait (favoring right leg (painful)) abnormal. Coordination normal.   Skin: Skin is warm and dry. She is not diaphoretic.   Nursing note and vitals reviewed.          ASSESSMENT & PLAN    (S76.011A) Hip strain, right, initial encounter  (primary encounter diagnosis)    Patient's partner stormed into the exam room during the visit and demanded that they leave immediately. Therefore, her physical exam was incomplete. History indicated likely hip strain so I did provide AVS and had reviewed conservative measures during the visit including icing (no heat), NSAIDs, proper footwear, non-weight bearing exercise with adequate stretching.

## 2015-03-27 ENCOUNTER — Ambulatory Visit (INDEPENDENT_AMBULATORY_CARE_PROVIDER_SITE_OTHER): Payer: Medicare Other | Admitting: Family Medicine

## 2015-03-27 ENCOUNTER — Encounter (INDEPENDENT_AMBULATORY_CARE_PROVIDER_SITE_OTHER): Payer: Self-pay | Admitting: Family Medicine

## 2015-03-27 VITALS — BP 138/86 | HR 73 | Temp 97.0°F | Resp 16 | Wt 359.0 lb

## 2015-03-27 DIAGNOSIS — M545 Low back pain, unspecified: Secondary | ICD-10-CM

## 2015-03-27 DIAGNOSIS — R350 Frequency of micturition: Secondary | ICD-10-CM

## 2015-03-27 DIAGNOSIS — M62830 Muscle spasm of back: Secondary | ICD-10-CM

## 2015-03-27 LAB — PR U/A AUTO DIPSTICK ONLY, ONSITE
Bilirubin, Urine: NEGATIVE
Glucose, Urine: NEGATIVE mg/dL
Ketones, URN: NEGATIVE mg/dL
Leukocytes: NEGATIVE
Nitrite, URN: NEGATIVE
Occult Blood, URN: NEGATIVE
Protein: NEGATIVE mg/dL
Specific Gravity, Urine: 1.02 (ref 1.005–1.030)
Urobilinogen, URN: 1 E.U./dL (ref 0.2–1.0)
pH, URN: 6.5 (ref 5.0–8.0)

## 2015-03-27 MED ORDER — CYCLOBENZAPRINE HCL 10 MG OR TABS
10.0000 mg | ORAL_TABLET | Freq: Every evening | ORAL | Status: DC | PRN
Start: 2015-03-27 — End: 2015-04-02

## 2015-03-27 NOTE — Patient Instructions (Signed)
Conservative measures- heat/ice 20 min at a time not directly on the skin, supportive shoes, stretching, massage, proper posture.     Provided muscle relaxer to use at bedtime and advised not to drive, work, drink alcohol while taking.    Ibuprofen  tablets, take 3 tablets every 6-8 hours with food.         Self-Care for Low Back Pain    Most people have low back pain now and then. In many cases, it isn't serious and self-care can help. Sometimes low back pain can be a sign of a bigger problem. Call your healthcare provider if your pain returns often or gets worse over time. For the long-term care of your back, get regular exercise, lose any excess weight and learn good posture.  Take a short rest  Lying down during the day may be beneficial for short periods of time if severe pain increases with sitting or standing. Long-term bed rest could be detrimental.  Reduce pain and swelling  Cold reduces swelling. Both cold and heat can reduce pain. Protect your skin by placing a towel between your body and the ice or heat source.   For the first few days, apply an ice pack for 15 to20 minutes .   After the first few days, try heat for 15 minutes at a time to ease pain. Never sleep on a heating pad.   Over-the-counter medicine can help control pain and swelling. Try aspirin oribuprofen.  Exercise  Exercise can help your back heal. It also helps your back get stronger and more flexible, preventing any reinjury. Ask your healthcare provider about specific exercises for your back.  Use good posture to avoid reinjury   When moving, bend at the hips and knees. Don't bend at the waist or twist around.   When lifting, keep the object close to your body. Don't try to lift more than you can handle.   When sitting, keep your lower back supported. Use a rolled-up towel as needed.  Seek immediate medical care if:   You're unable to stand or walk.   You have a temperature over 100.32F (38.0C)   You have frequent,  painful, or bloody urination.   You have severe abdominal pain.   You have a sharp, stabbing pain.   Your pain is constant.   You have pain or numbness in your leg.   You feel pain in a new area of your back.   You notice that the pain isn't decreasing after more than a week.    [address] [employer] [address] [place], [place] [address]. All rights reserved. This information is not intended as a substitute for professional medical care. Always follow your healthcare professional's instructions.

## 2015-03-27 NOTE — Progress Notes (Signed)
Chief Complaint   Patient presents with   . Back Pain     Pt c/o lower right back pain, pain with urination x 1day. Pt reports hx of kidney store on the right side.        HPI  Sharon Stephens is a 39 year old morbidly obese female who presents with complaint of right sided low back pain which she woke up with yesterday.  She is concerned she may have a kidney stone since she had one many years ago and remembers associated low back pain.  Describes the pain as a dull pain with intermittent sharp pains associated with movement.  Denies any radiating pain.  She denies burning with urination but admits to increased frequency explained by increased fluid intake intentionally for the past week.  Denies blood in the urine. 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  Denies any change in her bowel function.  Pain is worse with sitting or laying for too long.  Denies fever/chills, nausea/vomiting, rash, vaginal symptoms.  Currently has a Mirena IUD which has been in place for 7 years, no periods.  Is in a same-sex relationship so no risk for pregnancy.  Denies pelvic pain.  Denies lower extremity numbness or weakness.  Admits to frequently walking barefoot or in sandals.        Outpatient Prescriptions Prior to Visit   Medication Sig Dispense Refill   . Acetaminophen 500 MG Oral Tab Take 1 tablet (500 mg) by mouth every 4 hours as needed for pain. 60 tablet 2   . Acetaminophen-Codeine #3 (TYLENOL WITH CODEINE #3) 300-30 MG Oral Tab Take 1-2 tablets by mouth every 4 hours as needed for pain. 10 tablet 0   . Albuterol Sulfate HFA 108 (90 BASE) MCG/ACT Inhalation Aero Soln Inhale 1-2 puffs by mouth every 4 hours as needed for shortness of breath/wheezing. For shortness of breath. 1 Inhaler 0   . BuPROPion HCl, XL, 150 MG Oral TABLET SR 24 HR Take 1 tablet (150 mg) by mouth  daily. 30 tablet 1   . Cholecalciferol (VITAMIN D) 1000 UNITS Oral Tab TAKE 1 TABLET DAILY     . Diclofenac Sodium 1 % Transdermal Gel Apply 1 g topically 4 times a day. Apply to area of pain for plantar fasciitis. 1 Tube 1   . FLUoxetine HCl 10 MG Oral Tab Take 1 tablet (10 mg) by mouth daily. 30 tablet 1   . Fluticasone Propionate 50 MCG/ACT Nasal Suspension Spray 2 sprays into each nostril daily. 1 Inhaler 0   . Levonorgestrel 20 MCG/24HR Intrauterine IUD as directed     . Levothyroxine Sodium 25 MCG Oral Tab Take 1 tablet (25 mcg) by mouth daily on an empty stomach. 30 tablet 1   . Meloxicam 15 MG Oral Tab Take 1 tablet (15 mg) by mouth daily. 30 tablet 1   . Methocarbamol 750 MG Oral Tab Take 1-2 tablets (750-1,500 mg) by mouth 3 times a day as needed for muscle spasms. 60 tablet 1   . Multiple Vitamin (MULTI VITAMIN MENS OR) Once daily     . Mupirocin (BACTROBAN) 2 % External Ointment Apply 1 application topically 3 times a day. Apply to neck lesion. 1 Tube 1   . Triamcinolone Acetonide 0.1 % External Ointment Apply 1 application topically 2  times a day. Apply to affected skin in neck. 1 Tube 1     No facility-administered medications prior to visit.       Review of patient's allergies indicates:  Allergies   Allergen Reactions   . Amoxicillin      Other reaction(s): Undetermined  Unsure from childhood    . Penicillin G    . Penicillins      Other reaction(s): Undetermined  Unsure from childhood        Past Medical History   Diagnosis Date   . Depressive disorder, not elsewhere classified    . Unspecified sleep apnea    . Hernia of other specified sites of abdominal cavity without mention of obstruction or gangrene      notes having 3 in abdomen    . Allergic rhinitis, cause unspecified    . Chronic obstructive asthma, unspecified    . Type II or unspecified type diabetes mellitus without mention of complication, not stated as uncontrolled      broaderline    . Esophageal reflux    . Essential hypertension,  benign    . Morbid obesity (HCC)    . Generalized osteoarthrosis, unspecified site        Social History   Substance Use Topics   . Smoking status: Never Smoker    . Smokeless tobacco: Never Used   . Alcohol Use: No         ROS  Review of Systems   Constitutional: Positive for activity change. Negative for fever, chills, diaphoresis, appetite change and unexpected weight change.   Cardiovascular: Negative for leg swelling.   Gastrointestinal: Negative for nausea, vomiting, abdominal pain, diarrhea and constipation.   Genitourinary: Positive for frequency. Negative for dysuria, urgency, hematuria, decreased urine volume, vaginal bleeding, vaginal discharge, difficulty urinating and pelvic pain.   Musculoskeletal: Positive for back pain. Negative for myalgias, joint swelling, gait problem, neck pain and neck stiffness.   Skin: Negative for rash.   Neurological: Negative for dizziness, weakness, light-headedness, numbness and headaches.   Psychiatric/Behavioral: Negative for sleep disturbance.         PHYSICAL EXAM  BP 138/86 mmHg  Pulse 73  Temp(Src) 97 F (36.1 C) (Temporal)  Resp 16  Wt 359 lb (162.841 kg)  SpO2 100%    Physical Exam   Constitutional: She is oriented to person, place, and time. She appears well-developed and well-nourished. She is cooperative. She does not appear ill. No distress.   Cardiovascular: Normal rate and regular rhythm.    Pulmonary/Chest: Effort normal and breath sounds normal.   Musculoskeletal:        Thoracic back: Normal.        Lumbar back: She exhibits tenderness and spasm. She exhibits no swelling and no edema.        Back:    Negative straight leg raise   Neurological: She is alert and oriented to person, place, and time. She exhibits normal muscle tone. Coordination and gait normal.   Unable to check reflexes due to body habitus.   Skin: Skin is warm and dry. No rash noted. She is not diaphoretic. No erythema.   Nursing note and vitals reviewed.          ASSESSMENT &  PLAN    (M54.5) Acute right-sided low back pain without sciatica  (primary encounter diagnosis)  Plan: Cyclobenzaprine HCl (FLEXERIL) 10 MG Oral Tab    (M62.830) Muscle spasm of back  Plan: Cyclobenzaprine HCl (FLEXERIL) 10 MG Oral Tab    (  R35.0) Urinary frequency  Plan: U/A AUTO DIPSTICK ONLY, ONSITE      UA normal and not consistent with kidney stone or UTI.  Urinary frequency is explained by her increased fluid intake.  Reviewed signs and symptoms that would indicate need for further evaluation.   She is working on weight loss    Advised on the following:  Conservative measures- heat/ice 20 min at a time not directly on the skin, supportive shoes, stretching, massage, proper posture.   Provided muscle relaxer to use at bedtime and advised not to drive, work, drink alcohol while taking.  Ibuprofen  tablets, take 3 tablets every 6-8 hours with food.   Evaluate desk area to be sure sitting properly and add frequent short walks or standing

## 2015-03-29 ENCOUNTER — Encounter (INDEPENDENT_AMBULATORY_CARE_PROVIDER_SITE_OTHER): Payer: Self-pay | Admitting: Family Medicine

## 2015-03-29 ENCOUNTER — Telehealth (INDEPENDENT_AMBULATORY_CARE_PROVIDER_SITE_OTHER): Payer: Self-pay | Admitting: Family Medicine

## 2015-03-29 ENCOUNTER — Other Ambulatory Visit (INDEPENDENT_AMBULATORY_CARE_PROVIDER_SITE_OTHER): Payer: Self-pay | Admitting: Family Medicine

## 2015-03-29 DIAGNOSIS — F1020 Alcohol dependence, uncomplicated: Secondary | ICD-10-CM | POA: Insufficient documentation

## 2015-03-29 NOTE — Telephone Encounter (Signed)
Per Dr. Daleen Squibb. Please see if the patient can come in at 7:15PM on 04/02/2015 so that she can have a appointment. Slot on hold for patient.

## 2015-03-30 NOTE — Telephone Encounter (Signed)
2nd attempt unable to take calls at this time, not able to leave a VM        Per Dr. Daleen Squibb. Please see if the patient can come in at 7:15PM on 04/02/2015 so that she can have a appointment. Slot on hold for patient.

## 2015-04-02 ENCOUNTER — Ambulatory Visit (INDEPENDENT_AMBULATORY_CARE_PROVIDER_SITE_OTHER): Payer: Medicare Other | Admitting: Family Medicine

## 2015-04-02 ENCOUNTER — Telehealth (INDEPENDENT_AMBULATORY_CARE_PROVIDER_SITE_OTHER): Payer: Self-pay | Admitting: Family Medicine

## 2015-04-02 ENCOUNTER — Encounter (INDEPENDENT_AMBULATORY_CARE_PROVIDER_SITE_OTHER): Payer: Self-pay | Admitting: Family Medicine

## 2015-04-02 VITALS — BP 149/92 | HR 64 | Temp 97.7°F | Resp 20 | Wt 358.0 lb

## 2015-04-02 DIAGNOSIS — M545 Low back pain, unspecified: Secondary | ICD-10-CM

## 2015-04-02 DIAGNOSIS — M62830 Muscle spasm of back: Secondary | ICD-10-CM

## 2015-04-02 DIAGNOSIS — Z23 Encounter for immunization: Secondary | ICD-10-CM

## 2015-04-02 MED ORDER — CYCLOBENZAPRINE HCL 10 MG OR TABS
10.0000 mg | ORAL_TABLET | Freq: Every evening | ORAL | Status: DC | PRN
Start: 2015-04-02 — End: 2015-06-12

## 2015-04-02 NOTE — Progress Notes (Signed)
SUBJECTIVE:   Sharon Stephens is a 39 year old female who complains of an injury causing low back pain 1 week ago- after moving.  The pain is positional with bending or lifting, without radiation down the legs. Right sided pain in R low back. Mechanism of injury: lifting furniture. Symptoms have been rapid and gradual since that time. Prior history of back problems: recurrent self limited episodes of low back pain in the past. There is no numbness in the legs.    OBJECTIVE:  Vital signs as noted above. Patient appears to be in mild to moderate pain, antalgic gait noted. Lumbosacral spine area reveals no local tenderness or mass. Painful and reduced LS ROM noted. Straight leg raise is equivocal at 30 degrees on right . DTR's, motor strength and sensation normal, including heel and toe gait.  Peripheral pulses are palpable. Lumbar spine X-Ray: not indicated.  (unchanged exam from 9/20)    ASSESSMENT:   lumbar strain    PLAN:  For acute pain, rest, intermittent application of cold packs (later, may switch to heat, but do not sleep on heating pad), analgesics and muscle relaxants are recommended. Discussed longer term treatment plan of prn NSAID's and discussed a home back care exercise program with flexion exercise routine. Proper lifting with avoidance of heavy lifting discussed. Consider Physical Therapy and XRay studies if not improving. Call or return to clinic prn if these symptoms worsen or fail to improve as anticipated.

## 2015-04-02 NOTE — Progress Notes (Signed)
Reason for Visit: Pain in back    Refills? NO  Referral? NO  Letter or Form? NO  Lab Results? NO    HEALTH MAINTENANCE:  Has the patient had this done since their last visit?  Cervical screening/PAP: Due  Mammo: N/A  Colon Screen: N/A    Have you seen a specialist since your last visit: No    Vaccines Due? Yes, Flu      HM Due:   Health Maintenance   Topic Date Due   . HIV Screen  12/02/77   . Pap Smear  11/11/2013   . Influenza Vaccine (1) 03/08/2015   . Tetanus Vaccine  05/05/2019       PCP Verified?  Yes, Zackery Barefoot, MD      Vaccine Screening Questions      1.  Have you had a serious reaction or an allergic reaction to a vaccine?  NO    2.  Currently have a moderate or severe illness, including fever? (Don't Ask if vaccine   ordered by provider same day)  NO    3.  Ever had a seizure or a brain or other nervous system problem syndrome associated with a vaccine? (DTaP/TDaP/DTP pertinent) NO    4.  Is patient receiving  any live vaccinations today? (Varicella-Chickenpox, MMR-Measles/Mumps/Rubella, Zoster-Shingles, Flumist, Yellow Fever) NOTE: oral rotavirus is exempt  NO    If YES to any of the questions above - Do NOT give vaccine.  Consult with RN or provider in clinic.  (#4 can be YES if all Live vaccine questions are answered NO)    If NO to all questions above - Patient may receive vaccine.    5.  Do you need to receive the Flu vaccine today? YES - Additional Flu Questions  Flu Vaccine Screening Questions:    Ever had a serious allergic reaction to eggs?  NO    Ever had Guillain-Barre syndrome associated with a vaccine? NO    Less than 6 months old? NO    If YES to any of the Flu questions above - NO Flu Vaccine to be given.  Patient may consult provider as needed.    If NO to all questions above - Patient may receive Flu Shot (IM)    If between 6 months and 24 years of age, was flu vaccine received last year?  N/A  If NO to above question:  . Children who are receiving influenza vaccine for the first  time - administer 2 doses of the current influenza vaccine (separated by at least 4 weeks).        Vaccine information sheet(s) discussed, patient/parent/guardian verbalized understanding? YES     VIS given 04/02/2015 by Birdena Crandall, CMA       Vaccine given today without initial adverse effect. YES    Llana Aliment CMA

## 2015-04-02 NOTE — Telephone Encounter (Signed)
Pt will be in at 7:15 tonight.     Closing TE

## 2015-04-02 NOTE — Telephone Encounter (Signed)
Apt length changed, pt will be in at 7:15 PM.     Closing TE

## 2015-04-02 NOTE — Telephone Encounter (Signed)
LMTCB    CCR: Asked pt per provider to come in 15 minutes earlier than scheduled appt tonight 9/26. Please transfer pt to NG FD to confirm earlier start time. 7:15 slot is being held for the pt.    2nd attempt.

## 2015-04-02 NOTE — Patient Instructions (Signed)
Alternate the ibuprofen 3 tablets with 2 adult strength acetamenophen.  Alternate ice with heat on area of back.  Use flexeril at bedtime

## 2015-04-02 NOTE — Telephone Encounter (Signed)
(  TEXTING IS AN OPTION FOR [facility] CLINICS ONLY)  Is this a [facility] clinic? Yes. Patient declined the option to receive mobile text messages.      RETURN CALL: No call back needed      SUBJECT:  General Message     REASON FOR REQUEST: Patient received VM asking her to come in 15 minutes early for her appointment this evening and she is confirming that she can do that and will be there at 7:15pm.     MESSAGE: above

## 2015-05-29 ENCOUNTER — Telehealth (INDEPENDENT_AMBULATORY_CARE_PROVIDER_SITE_OTHER): Payer: Self-pay | Admitting: Family Medicine

## 2015-05-29 NOTE — Telephone Encounter (Addendum)
Pt requesting paperwork be filled out for a new handicap placard.  Filled out basic information and placed unfinished form in provider's sign folder.  Pt has appt next Tuesday    Routing to Dr. Louann LivFeliciano

## 2015-05-29 NOTE — Telephone Encounter (Signed)
(  TEXTING IS AN OPTION FOR [facility] CLINICS ONLY)  Is this a [facility] clinic? No      RETURN CALL: Detailed message on voicemail only      SUBJECT:  Form/Letter/Paperwork Request     REASON FOR REQUEST: Handicap Placard    FORM/LETTER/PAPERWORK IS FOR: Disabled/Handicap Placard  WHO SHOULD FILL IT OUT?: Felciano, [name]  WILL PATIENT PICK UP?: Yes  NEEDED BY: ASAP  ADDITIONAL INFORMATION: Patient needs new Handicap Placard.

## 2015-05-29 NOTE — Telephone Encounter (Signed)
(  TEXTING IS AN OPTION FOR [facility] CLINICS ONLY)  Is this a [facility] clinic? No      RETURN CALL: Detailed message on voicemail only      SUBJECT:  Referral Request      REFERRING PROVIDER: Tonye RoyaltyFeliciano, [name]  SYMPTOM(S)/DIAGNOSIS: Pain Management    CLINIC AND LOCATION: Pain Clinic  NAME OF PROVIDER: NA  TYPE OF SPECIALTY: Pain Management  CLINIC PHONE: NA CLINIC FAX: NA  HAS THE REFERRAL ALREADY BEEN REQUESTED?: No  ADDITIONAL INFORMATION: Patient advised she had spoken with Dr Louann LivFeliciano about a potential referral to a pain management clinic in the past but decided against it, however her symptoms are still on going, and she has decieded she would like to still get the referral to a pain clinic if it is still an option. Please call at [phone] to discuss. Thank You.

## 2015-05-29 NOTE — Telephone Encounter (Signed)
Routing to referral pool

## 2015-05-29 NOTE — Telephone Encounter (Signed)
Please inform patient that Dr. Louann LivFeliciano will complete this at her appointment.  Thanks.

## 2015-05-29 NOTE — Telephone Encounter (Signed)
Called and left VM informing pt.

## 2015-05-29 NOTE — Telephone Encounter (Signed)
LMTCB.  CCR: Please obtain reason for pain referral. What type of pain and in what body region is patient experiencing?   Referral pended.  Provider to complete and sign if approved.  Thanks.

## 2015-05-30 ENCOUNTER — Inpatient Hospital Stay: Payer: Self-pay

## 2015-06-02 ENCOUNTER — Inpatient Hospital Stay: Payer: Self-pay

## 2015-06-04 ENCOUNTER — Encounter (INDEPENDENT_AMBULATORY_CARE_PROVIDER_SITE_OTHER): Payer: Self-pay | Admitting: Family Medicine

## 2015-06-05 ENCOUNTER — Ambulatory Visit (INDEPENDENT_AMBULATORY_CARE_PROVIDER_SITE_OTHER): Payer: Medicare Other | Admitting: Family Medicine

## 2015-06-07 NOTE — Telephone Encounter (Signed)
Awaiting appointment. Will close TE.

## 2015-06-08 ENCOUNTER — Encounter (INDEPENDENT_AMBULATORY_CARE_PROVIDER_SITE_OTHER): Payer: Self-pay | Admitting: Family Medicine

## 2015-06-08 ENCOUNTER — Ambulatory Visit (INDEPENDENT_AMBULATORY_CARE_PROVIDER_SITE_OTHER): Payer: Medicare Other | Admitting: Family Medicine

## 2015-06-08 VITALS — BP 138/82 | HR 60 | Temp 96.0°F | Resp 18 | Wt 364.0 lb

## 2015-06-08 DIAGNOSIS — E038 Other specified hypothyroidism: Secondary | ICD-10-CM

## 2015-06-08 DIAGNOSIS — E876 Hypokalemia: Secondary | ICD-10-CM

## 2015-06-08 DIAGNOSIS — E039 Hypothyroidism, unspecified: Secondary | ICD-10-CM

## 2015-06-08 DIAGNOSIS — E559 Vitamin D deficiency, unspecified: Secondary | ICD-10-CM

## 2015-06-08 DIAGNOSIS — Z4802 Encounter for removal of sutures: Secondary | ICD-10-CM
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
## 2015-06-09 ENCOUNTER — Encounter (INDEPENDENT_AMBULATORY_CARE_PROVIDER_SITE_OTHER): Payer: Medicare Other | Admitting: Family Medicine

## 2015-06-09 LAB — BASIC METABOLIC PANEL
Anion Gap: 9 (ref 4–12)
Calcium: 9 mg/dL (ref 8.9–10.2)
Carbon Dioxide, Total: 25 meq/L (ref 22–32)
Chloride: 106 meq/L (ref 98–108)
Creatinine: 0.54 mg/dL (ref 0.38–1.02)
GFR, Calc, African American: >60 mL/min/{1.73_m2}
GFR, Calc, European American: >60 mL/min/{1.73_m2}
Glucose: 84 mg/dL (ref 62–125)
Potassium: 4.3 meq/L (ref 3.6–5.2)
Sodium: 140 meq/L (ref 135–145)
Urea Nitrogen: 11 mg/dL (ref 8–21)

## 2015-06-09 LAB — TSH WITH REFLEXIVE FREE T4: TSH with Reflexive Free T4: 1.661 u[IU]/mL (ref 0.400–5.000)

## 2015-06-11 ENCOUNTER — Telehealth (INDEPENDENT_AMBULATORY_CARE_PROVIDER_SITE_OTHER): Payer: Self-pay | Admitting: Family Medicine

## 2015-06-11 NOTE — Telephone Encounter (Signed)
Routing to Dr. Louann LivFeliciano.  Pt had to cancel 11/29 appt and she had planned to address a request for a handicap placard.  Are you able to fill out pt's paperwork for a handicap placard without an OV or should she reschedule?

## 2015-06-11 NOTE — Telephone Encounter (Signed)
(  TEXTING IS AN OPTION FOR [facility] CLINICS ONLY)  Is this a [facility] clinic? No      RETURN CALL: Detailed message on voicemail only      SUBJECT:  General Message     REASON FOR REQUEST: Patient had to cancel her wellness exam 06/05/15. Can her paperwork for handicap placard still be filled out?      MESSAGE: na

## 2015-06-12 ENCOUNTER — Encounter (INDEPENDENT_AMBULATORY_CARE_PROVIDER_SITE_OTHER): Payer: Self-pay | Admitting: Family Medicine

## 2015-06-12 ENCOUNTER — Ambulatory Visit (INDEPENDENT_AMBULATORY_CARE_PROVIDER_SITE_OTHER): Payer: Medicare Other | Admitting: Family Medicine

## 2015-06-12 VITALS — BP 126/83 | HR 90 | Temp 98.0°F | Resp 16 | Wt 357.0 lb

## 2015-06-12 DIAGNOSIS — M791 Myalgia: Secondary | ICD-10-CM

## 2015-06-12 DIAGNOSIS — M7918 Myalgia, other site: Secondary | ICD-10-CM

## 2015-06-12 LAB — VITAMIN D (25 HYDROXY)
Vit D (25_Hydroxy) Total: 9.5 ng/mL — ABNORMAL LOW (ref 20.1–50.0)
Vitamin D2 (25_Hydroxy): <1 ng/mL
Vitamin D3 (25_Hydroxy): 9.5 ng/mL
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
## 2015-06-12 NOTE — Telephone Encounter (Signed)
Please find out what the condition is for which she needs the handicap as there are restrictions by law to this service and I am not able to find one in her chart or one that I am aware of. Thanks.

## 2015-06-12 NOTE — Progress Notes (Signed)
Reason for Visit:   Chief Complaint   Patient presents with   . Musculoskeletal Problem     pt states she is been having right hip problems          Refills? NO  Referral? NO  Letter or Form? NO  Lab Results? NO    HEALTH MAINTENANCE:  Has the patient has this done since their last visit?  Cervical screening/PAP: No - Order Pended  Mammo: N/A  Colon Screen: N/A  Diabetic Eye Exam (If applicable): N/A      Have you seen a specialist since your last visit: No    Vaccines Due? No    PHQ2 done in the last year (365 days)? Yes     Does patient have eCare?  Yes     HM Due:   Health Maintenance   Topic Date Due   . HIV Screen  07-15-1975   . Pap Smear  11/11/2013   . Tetanus Vaccine  05/05/2019   . Influenza Vaccine  Completed       PCP Verified?  Yes, feliciano

## 2015-06-12 NOTE — Telephone Encounter (Signed)
Called pt to get further information regarding this request.  Pt stated that she has problems with her feet and legs.  She has plantar fascitis that makes walking difficult.  Pt also cited back issues as a contributing factor to difficulties with walking.  Pt stated that she did have a previous handicap placard and that the paperwork was [place] by [place] (one filled out during 08/24/2009 visit, didn't see any visits more recent with this request)    Pt willing to come in for this request if needed.    Routing back to Dr. Louann LivFeliciano

## 2015-06-12 NOTE — Patient Instructions (Signed)
You have been referred to [facility] for a Xray  of your  to help be sur eyou do not have a fracture .  Please call them to schedule an appointment.    They are located in the [place] area on [address]   .  They should call you, but if you are proactive and call them in 1-2 days, it can help assure that you get the appointment you want.  Please let us know if you are having trouble scheduling your appointment.   [facility] [phone] for all modalities

## 2015-06-14 ENCOUNTER — Encounter (INDEPENDENT_AMBULATORY_CARE_PROVIDER_SITE_OTHER): Payer: Self-pay | Admitting: Family Medicine

## 2015-06-14 NOTE — Telephone Encounter (Signed)
Please tell patient that I am sorry but unfortunately, there are new qualifications for the placard and this does not qualify for a handicap placard. It must be for more serious disorders such as stroke, severe heart or lung disease, amputation. I apologize.

## 2015-06-15 NOTE — Telephone Encounter (Signed)
Called and informed pt of this via VM.  Informed pt to call back if she has any further questions or concerns.  Closing.

## 2015-06-21 ENCOUNTER — Encounter (INDEPENDENT_AMBULATORY_CARE_PROVIDER_SITE_OTHER): Payer: Self-pay | Admitting: Family Practice

## 2015-06-21 ENCOUNTER — Ambulatory Visit (INDEPENDENT_AMBULATORY_CARE_PROVIDER_SITE_OTHER): Payer: Medicare Other | Admitting: Family Practice

## 2015-06-21 VITALS — BP 142/89 | HR 72 | Temp 97.2°F | Resp 24

## 2015-06-21 DIAGNOSIS — M549 Dorsalgia, unspecified: Secondary | ICD-10-CM

## 2015-06-21 MED ORDER — KETOROLAC TROMETHAMINE 30 MG/ML IJ SOLN
60.0000 mg | Freq: Once | INTRAMUSCULAR | Status: AC
Start: 2015-06-21 — End: 2015-06-21
  Administered 2015-06-21: 60 mg via INTRAMUSCULAR

## 2015-06-21 MED ORDER — METHOCARBAMOL 500 MG OR TABS
1000.0000 mg | ORAL_TABLET | Freq: Four times a day (QID) | ORAL | Status: DC | PRN
Start: 2015-06-21 — End: 2015-11-28

## 2015-06-21 MED ORDER — TRAMADOL HCL 50 MG OR TABS
50.0000 mg | ORAL_TABLET | Freq: Four times a day (QID) | ORAL | Status: DC | PRN
Start: 2015-06-21 — End: 2015-10-22

## 2015-06-21 NOTE — Patient Instructions (Signed)
Dear Sharon Stephens,    You can take tylenol 1 g (1000 mg ) three times a day for pain    Take tramadol as instructed as needed for severe pain  Methocarbamol as instructed as needed as a muscle relaxer  These medications can make you drowsy. Avoid driving or operating heavy machinery while taking this medicine  Gentle stretches    Heat/ice 3-4 times a day for 10-15 minutes at a time    Go to ER if pain is not well controlled,worsening or changing in quality    Sharon Stephens  Sharon Tamez, MD

## 2015-06-21 NOTE — Progress Notes (Signed)
Patient Referred By: No ref. provider found  Patient's PCP: Sharon Stephens, [name], MD     Subjective:  Patient is a 39 year old female, here to discuss Back Pain    The following portions of the patient's history were reviewed with the patient and updated as appropriate: problem list, current medications, allergies and past medical history.    HPI   Sharon Stephens is a 39 year old female c/o mid-back pain that started today. This morning picked up her granddaughter and felt a sharp pressure/pull in the middle of her back that radiated to the front side. Her partner said that she could hear her back pop. She leaned over to unplug her partner's cpap machine and had increased pain.  Pain is a tightness. Hurts really badly. Leaning over makes it worse. Sitting hurts. Laying on her back helped a little. Walking is painful. Has had lower back pain in the past , this is the middle of her back.   No numbness/tingling in her arms/legs  No problem controlling urine/bowels.no fevers.   She took some tylenol earlier    Review of Systems   Constitutional: Negative for fever.   Musculoskeletal: Positive for back pain.   Neurological: Negative for weakness and numbness.   All other systems reviewed and are negative.        Objective:  Physical Exam   Constitutional: She appears well-developed and well-nourished. No distress.   Eyes: No scleral icterus.   Cardiovascular: Normal rate, regular rhythm, normal heart sounds and intact distal pulses.  Exam reveals no gallop and no friction rub.    No murmur heard.  Pulmonary/Chest: Effort normal and breath sounds normal. No respiratory distress. She has no wheezes. She has no rales. She exhibits no tenderness.   Musculoskeletal:   EXTs: neurovascularly intact, 5/5 muscle strength of LEs and UEs  +ttp over mid spinous processes but increased pain in right side thoracic para-spinous muscles   DTRs +2  Was not able to do SLRs d/t patient's discomfort  No saddle anesthesia    Skin: She is not  diaphoretic.   Nursing note and vitals reviewed.       Assessment and Plan:  1. Mid back pain  Pt does have thoracic spinous process tenderness on exam but more pain right sided thoracic para-spinous muscle pain. Discussed X-ray thoracic spine would need to be a special machine d/t body habitus to r/o compression fracture vs other bony abnormality. Will treat conservatively now but understands if pain is not controlled at home or having any new/worsening symptoms will go to ER   - ketorolac 30 mg/mL injection; Inject 2 mL (60 mg) intramuscularly One time.  - Methocarbamol 500 MG Oral Tab; Take 2 tablets (1,000 mg) by mouth 4 times a day as needed for muscle spasms.  Dispense: 60 tablet; Refill: 0  - TraMADol HCl 50 MG Oral Tab; Take 1-2 tablets (50-100 mg) by mouth every 6 hours as needed for pain. For pain. Not to exceed 8 tablets per day.  Dispense: 15 tablet; Refill: 0  Secure access [place] reviewed: pt had 06/02/16 tramadol 14 tabs prescribed, 05/30/15 vicodin 8 tabs prescribed.prior to these prescriptions had prescription in 01/2015    See AVS for further patient instructions  RTC/Call if symptoms worsen or change in character or no improvement in 3-5 days. ER precautions reviewed.  Discussed medication dosage, usage, goals of therapy, and side effects.

## 2015-07-10 ENCOUNTER — Encounter (INDEPENDENT_AMBULATORY_CARE_PROVIDER_SITE_OTHER): Payer: Self-pay | Admitting: Medical

## 2015-07-10 ENCOUNTER — Ambulatory Visit (INDEPENDENT_AMBULATORY_CARE_PROVIDER_SITE_OTHER): Payer: Medicare Other | Admitting: Medical

## 2015-07-10 VITALS — BP 141/84 | HR 90 | Temp 98.3°F | Resp 18 | Wt 357.0 lb

## 2015-07-10 DIAGNOSIS — Z124 Encounter for screening for malignant neoplasm of cervix: Secondary | ICD-10-CM

## 2015-07-10 DIAGNOSIS — Z30433 Encounter for removal and reinsertion of intrauterine contraceptive device: Secondary | ICD-10-CM

## 2015-07-10 DIAGNOSIS — Z30432 Encounter for removal of intrauterine contraceptive device: Secondary | ICD-10-CM
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
## 2015-08-02 ENCOUNTER — Encounter (INDEPENDENT_AMBULATORY_CARE_PROVIDER_SITE_OTHER): Payer: Medicare Other | Admitting: Obstetrics & Gynecology

## 2015-08-02 NOTE — Progress Notes (Signed)
Sharon Stephens did not cancel and was not present for a scheduled appointment today.  No Show Letter #1 mailed to patient.

## 2015-09-12 ENCOUNTER — Encounter (INDEPENDENT_AMBULATORY_CARE_PROVIDER_SITE_OTHER): Payer: Self-pay | Admitting: Family Medicine

## 2015-09-12 ENCOUNTER — Ambulatory Visit (INDEPENDENT_AMBULATORY_CARE_PROVIDER_SITE_OTHER): Payer: Medicare Other | Admitting: Family Medicine

## 2015-09-12 VITALS — BP 136/86 | HR 76 | Temp 97.2°F | Resp 20 | Wt 363.0 lb

## 2015-09-12 DIAGNOSIS — M79672 Pain in left foot: Secondary | ICD-10-CM

## 2015-09-12 MED ORDER — TRAMADOL HCL 50 MG OR TABS
50.0000 mg | ORAL_TABLET | Freq: Four times a day (QID) | ORAL | Status: DC | PRN
Start: 2015-09-12 — End: 2015-10-22

## 2015-09-12 NOTE — Progress Notes (Signed)
Reason for Visit: see cc     Refills? NO  Referral? NO  Letter or Form? NO  Lab Results? NO    HEALTH MAINTENANCE:  Has the patient had this done since their last visit?  Cervical screening/PAP: N/A  Mammo: N/A  Colon Screen: N/A    Have you seen a specialist since your last visit: No    Vaccines Due? No     HM Due:   Health Maintenance   Topic Date Due   . HIV Screen  29-Apr-1976   . Pap Smear  11/11/2013   . Tetanus Vaccine  05/05/2019   . Influenza Vaccine  Completed       PCP Verified?  Yes, Dr.Feciliciano
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
## 2015-09-12 NOTE — Patient Instructions (Signed)
Take tramadol only for severe pain.  Continue tylenol. Avoid ibuprofen  Follow up with sports med.      Plantar Fasciitis  Plantar fasciitis is a painful swelling of the plantar fascia. The plantar fascia is a thick, fibrous layer of tissue. It covers the bones on the bottom of your foot. And it supports the foot bones in an arched position.  This can happen gradually or suddenly. It usually affects one foot at a time. Heel pain can be sharp, like a knife sticking into the bottom of your foot. You may feel pain after exercising, long-distance jogging, stair climbing, long periods of standing, or after standing up.  Risk factors include: non-active lifestyle, arthritis, diabetes, obesity or recent weight gain, flat foot, high arch. Wearing high heels, loose shoes, or shoes with poor arch support for long periods of time adds to the risk. This problem is commonly found in runners and dancers. It also found in people who stand on hard surfaces for long periods of time.  Foot pain from this condition is usually worse in the morning. But it improves with walking. By the end of the day there may be a dull aching. Treatment requires short-term rest and controlling swelling. It may take up to 9 months before all symptoms go away. Rarely, a steroid injection into the foot, or surgery, may be needed.  Home care   If you are overweight, lose weight to help healing.   Choose supportive shoes with good arch support and shock absorbency. Replace athletic shoes when they become worn out. Don't walk or run barefoot.   Premade or custom-fitted shoe inserts may be helpful. Inserts made of silicone seem to be the most effective. Custom-made inserts can be provided by a podiatrist or foot specialist, physical therapist, or orthopedist.   Premade or custom-made night splints keep the heel stretched out while you sleep. They may prevent morning pain.   Avoid activities that stress the feet: jogging, prolonged standing or walking,  contact sports, etc.   First thing in the morning and before sports, stretch the bottom of your feet. Gently flex your ankle so the toes move toward your knee.   Icing may help control heel pain. Apply an ice pack to the heel for 10-20 minutes as a preventive. Or ice your heel after a severe flare-up of symptoms. You may repeat this every 1-2 hours as needed.   You may use over-the-counter pain medicine to control pain, unless another medicine was prescribed. Anti-inflammatory pain medicines, such as ibuprofen or naproxen, may work better than acetaminophen. If you have chronic liver or kidney disease or ever had a stomach ulcer or GI bleeding, talk with your healthcare provider before using these medicines.  Follow-up care  Follow up with your healthcare provider, physical therapist, or podiatrist or foot specialist as advised.  Call for an appointment if pain worsens or there is no relief after a few weeks of home treatment. Shoe inserts, a night splint, or a special boot may be required.  If X-rays were taken, you will be told of any new findings that may affect your care.  When to seek medical advice  Call your healthcare provider right away if any of these occur:   Foot swelling   Redness with increasing pain   2000-2016 [employer] [address], [place], [place] [id number]. All rights reserved. This information is not intended as a substitute for professional medical care. Always follow your healthcare professional's instructions.

## 2015-09-24 ENCOUNTER — Ambulatory Visit (HOSPITAL_BASED_OUTPATIENT_CLINIC_OR_DEPARTMENT_OTHER): Payer: Medicare Other | Attending: Physical Medicine & Rehabilitation | Admitting: Physical Medicine & Rehabilitation

## 2015-09-24 ENCOUNTER — Encounter (HOSPITAL_BASED_OUTPATIENT_CLINIC_OR_DEPARTMENT_OTHER): Payer: Self-pay | Admitting: Physical Medicine & Rehabilitation

## 2015-09-24 VITALS — BP 149/81 | HR 73 | Ht 65.5 in | Wt 363.0 lb

## 2015-09-24 DIAGNOSIS — M722 Plantar fascial fibromatosis: Secondary | ICD-10-CM | POA: Insufficient documentation

## 2015-09-24 MED ORDER — TRAMADOL HCL 50 MG OR TABS
ORAL_TABLET | ORAL | Status: DC
Start: 2015-09-24 — End: 2015-10-22

## 2015-09-24 NOTE — Progress Notes (Signed)
Rogue JuryJULIA R. Stephens  MRN [id number]    September 24, 2015    HISTORY: This is a 40 year old woman who presents today for urgent evaluation regarding left heel pain. She is interested in having a repeat corticosteroid injection.     She has had symptoms and signs of plantar fasciitis for several years. The last time her left heel was injected was in August 2015. She states that her left heel had been doing well until the last several weeks. She is having pain in the plantar aspect of her heel and along the medial arch. Her symptoms are worse when first awakening, walking, and particularly stairs.     She underwent a right ultrasound-guided plantar fascia corticosteroid injection in August 2016 and is still doing well from that procedure.     She does have night splints which she uses episodically. She recently saw her primary care provider and was prescribed tramadol which she has used episodically.     She states that she is planning a camp excursion later this week in which there will be substantial standing and possibly walking. She would like to have some relief of symptoms prior. She is hoping for an injection.     PHYSICAL EXAMINATION:  Weight 363 lb. (June 2016 348 lb.)     Moderate left plantar medial heel tenderness. Minimal right-sided tenderness.     Neurovascular examination normal.     DISCUSSION: This is a 40 year old woman with chronic bilateral plantar fasciitis which has responded to corticosteroid injections. Over the past two years, she has had two injections in both feet. Each injection seems to have benefited her for at least six months if not longer. Unfortunately, her weight continues to increase.     PLAN: Because of her urgency, I have asked Dr. Beatriz Chancellormar Bhatti to add her into his schedule for an ultrasound-guided left plantar fascia injection. I explained to the patient again that these injections may or may not be beneficial short- or long-term and that there is risk including infection as well as the  risk of plantar fascia rupture. However, should that happen, her symptoms may actually be relieved somewhat. I have also again emphasized that her weight is a major obstacle to her recovering from the foot pain. She continues to recognize this.     We will proceed with the injection with Dr. Clydie BraunBhatti and she will follow up with me after this.     At her request, I did prescribe tramadol 50 mg #14. I have also explained to her that she should continue to receive medication from only one provider.     Today's visit was 15 minutes, greater than 50 percent of which was counseling and coordination of care.     Signature Line  Electronically Reviewed/Signed  _____________________________________  Graylin ShiverStuart M. Blaine HamperWeinstein, MD  Clinical Professor and Attending   [facility]  [phone]  [place], [place] [id number]    SMW/eh

## 2015-09-26 ENCOUNTER — Ambulatory Visit (HOSPITAL_BASED_OUTPATIENT_CLINIC_OR_DEPARTMENT_OTHER): Payer: Medicare Other | Attending: Sports Medicine | Admitting: Sports Medicine

## 2015-09-26 VITALS — BP 140/77 | HR 67 | Wt 362.7 lb

## 2015-09-26 DIAGNOSIS — M722 Plantar fascial fibromatosis: Secondary | ICD-10-CM | POA: Insufficient documentation

## 2015-09-26 NOTE — Progress Notes (Signed)
Specializing in sports-related injuries and non-surgical care for conditions of the spine,  shoulder, elbow, wrist, hand, hip, knee, foot, and ankle.      Referral source: Dr. Blaine HamperWeinstein     Procedure note:   Chief complaint: US-guided left plantar fascia injection    History of present illness:   Sharon Stephens is a 40 year old female who presents for an ultrasound guided left foot plantar fascia steroid injection. The patient was last seen by me on 02/05/15 for a right plantar fascia injection.  She notes continued improvement on the right.  Over the past several months she has again been experiencing progressively worsening left heel pain. Her symptoms are limiting her ability to ambulate. Her pain is localized to the left medial heel. We again discussed the importance of a weight loss management, as well as a foot and ankle strengthening program. She is interested in moving forward with a repeat left plantar fascia injection.     Medications and Allergies:   Medications and allergies were reviewed with the patient. No contraindications were identified.     Consent: Potential risks including pain, serious infection, bleeding, nerve injury, allergic reaction, and others were discussed with the patient. The patient understands the benefits, risks, and alternatives of the procedure and agrees to the procedure today.     Procedural details:  Using ultrasound, a prescan of the region was performed to identify the target structure.      The procedure was carried out under sterile technique.    Transducer: A 12-3 mHz linear transducer.   Patient position: Prone with the left foot and ankle hanging off of the table.   Localization process: The plantar fascia was localized in a short axis view at the level of the distal medial calcaneous.   Local anesthesia: No local anesthesia was used.   Needle: A 25 gauge 1.5 inch needle was used for the injection.   Approach: A medial to lateral, in plane, approach was used to guide the  needle tip in the plane between the superficial most fibers of the plantar fascia and the overlying fat pad.   Injection/Aspiration: A mixture of 1cc of 1% lidocaine and 1 cc of triamcinolone (40mg /cc) was injected without complication. The fat pad was visualized lifting off of the plantar fascia as the medication was injected.   Post-procedural care: The patient tolerated the procedure well. The patient was asked to ice for improved pain control and avoid submerging the area in water for the next 24 hours to help reduce the risk of infection.       Plan:  1. The patient will follow up with Dr. Blaine HamperWeinstein as needed.  2. Advised her to work on a weight loss management program.   3. All questions answered today in clinic.     Otho Perlmar Maurice Alise Calais, M.D.   Assistant Professor and Attending

## 2015-10-01 ENCOUNTER — Encounter (HOSPITAL_BASED_OUTPATIENT_CLINIC_OR_DEPARTMENT_OTHER): Payer: Medicare Other | Admitting: Sports Medicine

## 2015-10-17 ENCOUNTER — Encounter (INDEPENDENT_AMBULATORY_CARE_PROVIDER_SITE_OTHER): Payer: Medicare Other | Admitting: Family Medicine

## 2015-10-17 NOTE — Progress Notes (Signed)
Ms. Mcquerry did not cancel and was not present for a scheduled appointment today.

## 2015-10-22 ENCOUNTER — Ambulatory Visit (INDEPENDENT_AMBULATORY_CARE_PROVIDER_SITE_OTHER): Payer: Medicare Other | Admitting: Family Medicine

## 2015-10-22 VITALS — BP 126/66 | HR 99 | Temp 96.9°F | Resp 18 | Wt 364.0 lb

## 2015-10-22 DIAGNOSIS — M25561 Pain in right knee: Secondary | ICD-10-CM

## 2015-10-22 NOTE — Patient Instructions (Addendum)
The best lotions recommended by derm:  - Cetaphil, CeraVe, Aveeno    Knee Pain  Knee pain is very common. It's especially common in active people who put a lot of pressure on their knees, like runners. It affects women more often than men.  Your kneecap (patella) is a thick, round bone. It covers and protects the front portion of your knee joint. It moves along a groove in your thighbone (femur) as part of the patellofemoral joint. A layer of cartilage surrounds the underside of your kneecap. This layer protects it from grinding against your femur.  When this cartilage softens and breaks down, it can cause knee pain. This is partly because of repetitive stress. The stress irritates the lining of the joint. This causes pain in the underlying bone.  What causes knee pain?  Many things can cause knee pain. You may have more than one cause. Some of these include:   Overuse of the knee joint   The kneecap doesn't line up with the tissue around it   Damage to small nerves in the area   Damage to the ligament-like structure that holds the kneecap in place (retinaculum)   Breakdown of the bone under the cartilage   Swelling in the soft tissues around the kneecap   Injury  You might be more likely to have knee pain if you:   Exercise a lot   Recently increased the intensity of your workouts   Have a body mass index (BMI) greater than 25   Have poor alignment of your kneecap   Walk with your feet turned overly outward or inward   Have weakness in surrounding muscle groups (inner quad or hip adductor muscles)   Have too much tightness in surrounding muscle groups (hamstrings or iliotibial band)   Have a recent history of injury to the area   Are female  Symptoms of knee pain  This type of knee pain is a dull, aching pain in the front of the knee in the area under and around the kneecap. This pain may start quickly or slowly. Your pain might be worse when you squat, run, or sit for a long time. You might also  sometimes feel like your knee is giving out. You may have symptoms in one or both of your knees.  Diagnosing knee pain  Your health care provider will ask about your medical history and your symptoms. Be sure to describe any activities that make your knee pain worse. He or she will look at your knee. This will include tests of your range of motion, strength, and areas of pain of your knee. Your knee alignment will be checked.  Your health care provider will need to rule out other causes of your knee pain, such as arthritis. You may need an imaging test, such as an X-ray or MRI.  Treatment for knee pain  Treatments that can help ease your symptoms may include:   Avoiding activities for a while that make your pain worse, returning to activity over time   Icing the outside of your knee when it causes you pain   Taking over-the-counter pain medicine   Wearing a knee brace or taping your knee to support it   Wearing special shoe inserts to help keep your feet in the proper alignment   Doing special exercises to stretch and strengthen the muscles around your hip and your knee  These steps help most people manage knee pain. But some cases of knee pain need  to be treated with surgery. You may need surgery right away. Or you may need it later if other treatments don't work. Your health care provider may refer you to an orthopedic surgeon. He or she will talk with you about your choices.  Preventing knee pain  Losing weight and correcting excess muscle tightness or muscle weakness may help lower your risk.  In some cases, you can prevent knee pain. To help prevent a flare-up of knee pain, you do these things:   Regularly do all the exercises your doctor or physical therapist advises   Support your knee as advised by your doctor or physical therapist   Increase training gradually, and ease up on training when needed   Have an expert check your gait for running or other sporting activities   Stretch properly before  and after exercise   Replace your running shoes regularly   Lose excess weight    When to call your health care provider  Call your health care provider right away if:   Your symptoms don't get better after a few weeks of treatment   You have any new symptoms    2000-2016 [employer] [address], [place], [place] [id number]. All rights reserved. This information is not intended as a substitute for professional medical care. Always follow your healthcare professional's instructions.

## 2015-10-22 NOTE — Addendum Note (Signed)
Addended by: Simonne MaffucciHALTER, Alger Kerstein ELIZABETH on: 10/22/2015 03:11 PM     Modules accepted: Orders

## 2015-10-22 NOTE — Progress Notes (Signed)
SUBJECTIVE:  Sharon Stephens is a 40 year old female who sustained an unintentional  right knee injury 1 week ago.    She says that a few years ago she had a hyperextension injury to her R knee. She went to PT and lost weight, and it had been doing better. Lately there's been some pain again but she knows she needs to lose weight. She's been doing water aerobics and working on losing weight.    Then she fell on the R side of her body about 1 week ago. She landed on the side of the knee. Nothing is bruised, but it's sore. Flexing really hurts, especially in the morning. When she moves her leg, she feels like the muscles are being pulled. She spends some time flexing and extending it in the morning. She says it feels like it   "wants" to give way but has not yet.     She's been using ibuprofen (but has to be careful due to weight loss surgery and belly uses). She also uses Tyelnol. She tries to keep active. She has a history of b/l plantar fascitis and she worries that this changes her gait and can aggravate the knee. so she walks funny.      OBJECTIVE:  BP 126/66 mmHg  Pulse 99  Temp(Src) 96.9 F (36.1 C) (Temporal)  Resp 18  Wt 364 lb (165.109 kg)  SpO2 96%   Appearance: in no apparent distress and well developed and well nourished.  Knee exam: exam is limited by body habitus - it was difficult to assess joint lines; no MCL or LCL tenderness, no tenderness along patellofemoral ligament, no joint line tenderness as far as I could tell, neg Lachman, neg McMurrey,  X-ray: not indicated.    ASSESSMENT/PLAN:  Sharon Stephens was seen today for knee pain.  Diagnoses and all orders for this visit:    Recurrent pain of right knee  - Symptoms of sprained knee. I would not r/o old meniscal injury, but difficult to assess secondary to her body habitus.   - At this time, recommend compression around knee if feels good. Continue ibuprofen and Tylenol. Icing. Rest but gentle activity  - If not improving within a few weeks, would  refer to PT  - She expressed understanding to this plan    Sharon HeaterSarah E Nikash Mortensen, MD

## 2015-11-01 ENCOUNTER — Ambulatory Visit (INDEPENDENT_AMBULATORY_CARE_PROVIDER_SITE_OTHER): Payer: Medicare Other | Admitting: Family

## 2015-11-01 ENCOUNTER — Encounter (INDEPENDENT_AMBULATORY_CARE_PROVIDER_SITE_OTHER): Payer: Self-pay | Admitting: Family

## 2015-11-01 VITALS — BP 136/78 | HR 70 | Temp 97.9°F | Resp 16 | Wt 367.0 lb

## 2015-11-01 DIAGNOSIS — S93601A Unspecified sprain of right foot, initial encounter: Secondary | ICD-10-CM

## 2015-11-01 DIAGNOSIS — S99921A Unspecified injury of right foot, initial encounter: Secondary | ICD-10-CM

## 2015-11-01 MED ORDER — ACETAMINOPHEN-CODEINE #3 300-30 MG OR TABS
1.0000 | ORAL_TABLET | ORAL | Status: DC | PRN
Start: 2015-11-01 — End: 2015-11-28

## 2015-11-01 NOTE — Patient Instructions (Addendum)
Dear Sharon Stephens,     It was a pleasure taking care of you today.  Below you will find information on your visit and any home instructions.  Please take the time to review your discharge paperwork.  If you have not already signed up for [facility] please do as this is the easiest and fastest way to communicate with me.  Once your lab results are ready I can also send them to you via [facility] for you to review.     You do not appear to have a fracture to your right foot and it is most likely a sprain.  I have given you a shoe to wear for comfort while you heal.  I have prescribed some pain medication for you to take as needed for pain.  Please follow up with your primary care in 5-7 days and as needed.     Do not hesitate to call with any questions or concerns.       [employer].     Foot Sprain    A sprain is a stretching or tearing of the ligaments that hold a joint together. There are no broken bones. Sprainsgenerallytake from 3-6 weeks to heal. A sprain may be treated with a splint, walking cast, or special boot. Mild sprains may not need any additional support.  Home care  The following guidelines will help you care for your injury at home:   Keep your leg elevated when sitting or lying down. This is very important during the first 48 hours to reduce swelling. Stay off the injured foot as much as possible until you can walk on it without pain. If needed, you may use crutches during the first week for this purpose. Crutches can be rented at [employer] or surgical/orthopedic supply stores.   You may be given a cast shoe to wear to prevent movement in your foot. If not, you can use a sandal or any shoe that does not put pressure on the injured area until the swelling and pain go away. If using a sandal, be careful not to hit your foot against anything, since another injury could make the sprain worse.   Apply an ice pack over the injured area for 15 to 20 minutes every3 to 6hours. You should do this forthe first24  to 48 hours.You can make an ice pack by filling a plastic bag that seals at the top with ice cubes and then wrapping it with a thin towel. Continue to use ice packs for relief of pain and swelling as needed. As the ice melts, avoid getting any wrap, splint, or cast wet. After 48 hours, apply heatfrom a warm shower or bath for 20 minutes several times daily.Alternating ice and heat may also be helpful.   You may useover-the-counter pain medicineto control pain, unless another medicine was prescribed. If you have chronic liver or kidney disease or ever had a stomach ulcer or GI bleeding, talk with your healthcare provider beforeusing these medicines.   If you were given a splint or cast, keep it dry. Bathe with your splint or cast well out of the water, protected with 2large plastic bags, rubber-banded at the top end. If a fiberglass splint or cast gets wet, you can dry it with a hair dryer.   You may return to sports after healing, when you can run without pain.  Follow-up care  Follow up with your healthcare provider as directed. Sometimes fractures don't show up on the first X-ray. Bruises and sprains  can sometimes hurt as much as a fracture. These injuries can take time to heal completely. If your symptoms don't improve or they get worse, talk with your healthcare provider. You may need a repeat X-ray.  When to seek medical advice  Call your healthcare provider right awayif any of these occur:   The plaster cast or splint gets wet or soft   The fiberglass cast or splint gets wet and does not dry for 24 hours   Pain or swelling increases, or redness appears   A badodor comes from within the cast   Fever of 100.38F (38C) or above lasting for 24 to 48 hours   Toeson the injured footbecome cold, blue, numb, or tingly   2000-2016 [employer] [address], [place], [place] [id number]. All rights reserved. This information is not intended as a substitute for professional medical care.  Always follow your healthcare professional's instructions.

## 2015-11-01 NOTE — Progress Notes (Signed)
Patient Referred By: No ref. provider found  Patient's PCP: Tonye RoyaltyFeliciano, [name], MD     Subjective:  Patient is a 40 year old female, here to discuss Foot Injury    The following portions of the patient's history were reviewed with the patient and updated as appropriate: problem list, current medications, allergies, past medical history, past surgical history, past social history and past family history.    Foot Injury   The incident occurred 5 to 7 days ago. The incident occurred at home. The injury mechanism was a twisting injury. The pain is present in the right foot. The quality of the pain is described as aching. The pain is at a severity of 8/10. The pain is moderate. The pain has been worsening since onset. Pertinent negatives include no inability to bear weight, loss of motion, loss of sensation, muscle weakness, numbness or tingling. She reports no foreign bodies present. The symptoms are aggravated by movement, palpation and weight bearing. She has tried elevation, ice and NSAIDs for the symptoms. The treatment provided mild relief.   Pt states stepped and twisted foot in hole.  Has been trying rest and NSAIDs and Tylenol with no relief,.  Limited Tylenol use due to GI weight loss surgery.     Review of Systems   Cardiovascular: Negative for leg swelling.   Musculoskeletal: Positive for myalgias, arthralgias and gait problem. Negative for joint swelling.   Neurological: Negative for tingling and numbness.     Outpatient Prescriptions Prior to Visit   Medication Sig Dispense Refill   . Acetaminophen 500 MG Oral Tab Take 1 tablet (500 mg) by mouth every 4 hours as needed for pain. 60 tablet 2   . Cholecalciferol (VITAMIN D) 1000 UNITS Oral Tab TAKE 1 TABLET DAILY     . Levonorgestrel 20 MCG/24HR Intrauterine IUD 1 Intra Uterine Device by Intrauterine route One time.     . Levothyroxine Sodium 25 MCG Oral Tab Take 1 tablet (25 mcg) by mouth daily on an empty stomach. 30 tablet 1   . Methocarbamol 500 MG Oral  Tab Take 2 tablets (1,000 mg) by mouth 4 times a day as needed for muscle spasms. 60 tablet 0   . Multiple Vitamin (MULTI VITAMIN MENS OR) Once daily       No facility-administered medications prior to visit.     BP 136/78 mmHg  Pulse 70  Temp(Src) 97.9 F (36.6 C) (Oral)  Resp 16  Wt 367 lb (166.47 kg)  SpO2 100%  Review of patient's allergies indicates:  Allergies   Allergen Reactions   . Amoxicillin      Other reaction(s): Undetermined  Unsure from childhood    . Penicillin G    . Penicillins      Other reaction(s): Undetermined  Unsure from childhood            Objective:  Physical Exam   Constitutional: She is oriented to person, place, and time. She appears well-developed. No distress.   Morbidly obese     Cardiovascular: Intact distal pulses.    Pulmonary/Chest: Effort normal.   Musculoskeletal:        Right ankle: Normal. She exhibits normal range of motion, no swelling, no ecchymosis, no deformity, no laceration and normal pulse. No tenderness. Achilles tendon normal.        Right foot: There is tenderness (Marked tenderness along anterior aspect of right foot along the 1st tarsometatarsal joint  and navicular), bony tenderness and swelling (mild swelling noted, no brusing  or gravitiy lines observed. ). There is normal range of motion (pain with ROM dosiflexion> planterflexion), normal capillary refill, no crepitus, no deformity and no laceration.   Neurological: She is alert and oriented to person, place, and time. She has normal reflexes. No sensory deficit. Gait (antalgic gait) abnormal.   Skin: Skin is warm and dry. No erythema.   Psychiatric: She has a normal mood and affect. Her behavior is normal.        Assessment and Plan:    No fracture noted reviewed XR with Dr. Yetta Barre [facility].  Pt reported increased comfort with postop shoe.     1. Right foot injury, initial encounter    - XR FOOT 3 VW RIGHT  - [employer]  - Acetaminophen-Codeine #3 (TYLENOL WITH CODEINE #3) 300-30 MG  Oral Tab; Take 1-2 tablets by mouth every 4 hours as needed for pain.  Dispense: 10 tablet; Refill: 0    2. Right foot sprain, initial encounter    - Acetaminophen-Codeine #3 (TYLENOL WITH CODEINE #3) 300-30 MG Oral Tab; Take 1-2 tablets by mouth every 4 hours as needed for pain.  Dispense: 10 tablet; Refill: 0

## 2015-11-21 ENCOUNTER — Inpatient Hospital Stay: Payer: Self-pay

## 2015-11-23 ENCOUNTER — Inpatient Hospital Stay: Payer: Self-pay

## 2015-11-26 ENCOUNTER — Encounter (INDEPENDENT_AMBULATORY_CARE_PROVIDER_SITE_OTHER): Payer: Medicare Other | Admitting: Family Medicine

## 2015-11-28 ENCOUNTER — Ambulatory Visit (INDEPENDENT_AMBULATORY_CARE_PROVIDER_SITE_OTHER): Payer: Medicare Other | Admitting: Family Medicine

## 2015-11-28 VITALS — BP 161/82 | HR 76 | Temp 97.4°F | Resp 14 | Wt 370.6 lb

## 2015-11-28 DIAGNOSIS — T7491XS Unspecified adult maltreatment, confirmed, sequela: Secondary | ICD-10-CM

## 2015-11-28 DIAGNOSIS — S2341XA Sprain of ribs, initial encounter: Secondary | ICD-10-CM

## 2015-11-28 MED ORDER — ACETAMINOPHEN-CODEINE #3 300-30 MG OR TABS
1.0000 | ORAL_TABLET | ORAL | 0 refills | Status: DC | PRN
Start: 2015-11-28 — End: 2017-01-06

## 2015-11-28 MED ORDER — CYCLOBENZAPRINE HCL 10 MG OR TABS
10.0000 mg | ORAL_TABLET | Freq: Three times a day (TID) | ORAL | 1 refills | Status: DC | PRN
Start: 2015-11-28 — End: 2017-01-06

## 2015-11-28 NOTE — Progress Notes (Signed)
Reason for Visit: ER f/u    Refills? NO  Referral? NO  Letter or Form? NO  Lab Results? NO    HEALTH MAINTENANCE:  Has the patient had this done since their last visit?  Cervical screening/PAP: N/A  Mammo: N/A  Colon Screen: N/A    Have you seen a specialist since your last visit: No    Vaccines Due? No    HM Due:   Health Maintenance   Topic Date Due   . HIV Screen  1976-06-05   . Pap Smear  11/11/2013   . Tetanus Vaccine  05/05/2019   . Influenza Vaccine  Completed       PCP Verified?  Yes, Tonye RoyaltyFeliciano, [name], MD
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
## 2015-12-11 ENCOUNTER — Ambulatory Visit (INDEPENDENT_AMBULATORY_CARE_PROVIDER_SITE_OTHER): Payer: Medicare Other | Admitting: Family Medicine

## 2015-12-11 ENCOUNTER — Encounter (INDEPENDENT_AMBULATORY_CARE_PROVIDER_SITE_OTHER): Payer: Self-pay | Admitting: Family Medicine

## 2015-12-11 VITALS — BP 141/84 | HR 99 | Temp 98.1°F | Resp 20 | Wt 364.0 lb

## 2015-12-11 DIAGNOSIS — S20211D Contusion of right front wall of thorax, subsequent encounter: Secondary | ICD-10-CM

## 2015-12-11 MED ORDER — CAMPHOR-MENTHOL-METHYL SAL 1.2-5.7-6.3 % EX PTCH
1.0000 | MEDICATED_PATCH | Freq: Four times a day (QID) | CUTANEOUS | 0 refills | Status: DC | PRN
Start: 2015-12-11 — End: 2017-01-06
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
## 2015-12-11 NOTE — Patient Instructions (Addendum)
Tylenol 500 mg - 2 tablets up to three times daily  Around the clock

## 2016-01-03 ENCOUNTER — Inpatient Hospital Stay: Payer: Self-pay

## 2016-01-09 ENCOUNTER — Encounter (INDEPENDENT_AMBULATORY_CARE_PROVIDER_SITE_OTHER): Payer: Medicare Other | Admitting: Family Practice

## 2016-02-20 ENCOUNTER — Inpatient Hospital Stay: Payer: Self-pay

## 2016-02-22 ENCOUNTER — Encounter (INDEPENDENT_AMBULATORY_CARE_PROVIDER_SITE_OTHER): Payer: Self-pay

## 2016-03-10 ENCOUNTER — Inpatient Hospital Stay: Payer: Self-pay

## 2016-05-21 ENCOUNTER — Inpatient Hospital Stay: Payer: Self-pay

## 2016-05-28 ENCOUNTER — Ambulatory Visit (INDEPENDENT_AMBULATORY_CARE_PROVIDER_SITE_OTHER): Payer: Medicare Other | Admitting: Family Medicine

## 2016-05-28 VITALS — BP 134/84 | HR 85 | Temp 97.3°F | Resp 20

## 2016-05-28 DIAGNOSIS — J40 Bronchitis, not specified as acute or chronic: Secondary | ICD-10-CM

## 2016-05-28 MED ORDER — ALBUTEROL SULFATE HFA 108 (90 BASE) MCG/ACT IN AERS
2.0000 | INHALATION_SPRAY | Freq: Four times a day (QID) | RESPIRATORY_TRACT | 0 refills | Status: DC | PRN
Start: 2016-05-28 — End: 2017-01-06

## 2016-05-28 MED ORDER — PROMETHAZINE-CODEINE 6.25-10 MG/5ML OR SYRP
5.0000 mL | ORAL_SOLUTION | Freq: Four times a day (QID) | ORAL | 0 refills | Status: DC | PRN
Start: 2016-05-28 — End: 2017-01-06

## 2016-05-28 MED ORDER — AZITHROMYCIN 250 MG OR TABS
ORAL_TABLET | ORAL | 0 refills | Status: DC
Start: 2016-05-28 — End: 2017-01-06

## 2016-05-28 NOTE — Patient Instructions (Signed)
Urgent Care    Your medical care was provided today by:  Sharon Stephens E Kinslee Dalpe, MD    Unless otherwise instructed, please see your regular Provider in 2-3 days or sooner if your symptoms persist, worsen or change dramatically.   Continuity of medical care in one setting and by one Provider is optimal. Follow-up care is advised through your regular Provider.     If your symptoms worsen or concern, go to the nearest Hospital Emergency Department (ER) or return to the Urgent Care Center.     Always call 9-1-1 immediately if you develop a life threatening emergency.   Unless told otherwise please take all medications as directed and complete prescription therapies.   Watch for the following signs that require additional evaluation: progressive lethargy or unresponsiveness, stiff neck, localized pain (chest, abdomen), shortness of breath, painful breathing, progressive vomiting with weakness, bloody stools, or new rash.   If you are prescribed pain medication or any other medication that is sedating, do not take medication before or while operating a vehicle or heavy machinery or equipment due to potential side effects such as drowsiness and/or dizziness.   If laboratory test or imaging was ordered for you, we will call with the results of any abnormal laboratory test.    If your Primary Care Provider is not a [facility] Provider they may also request a copy of the labs or imaging reports which we will be happy to fax or mail to them.   Additional specified special instructions are listed below:    COUGH:  Your doctor wants you to have this information about coughing.  The body has a cough reflex which helps expel mucous secretions and irritants from the lung and airway passages.    Most coughs are caused by virus infections which may last for up to 2-3 weeks and do not require antibiotics.  Coughing helps to protect the lung from pneumonia. Cough spasms are periods of continuous coughing lasting several minutes. A persistent  cough lasting longer than 4-6 weeks requires medical evaluation by your primary care doctor.     Treatment of cough includes measures to loosen the cough and thin the mucous.  Warm liquids, cough drops, and nonprescription cough medicine may help reduce dry hacking cough.  Use a humidifier if necessary as dry air can make coughs worse.  Ultrasonic humidifiers are especially useful as they kill molds and many bacteria.  Some cough medicines have antihistamines, decongestants, or alcohol in them; there is no proof that any of these help control cough.    Prescription cough medicine or those with dextromethorphan (Robitussin DM or Delsym) should be reserved for dry coughs that prevent sleep or cause spasms or chest pain.  Avoid any exposure to cigarette smoke as this will worsen the cough or make it last much longer.  Call your doctor right away if you or your child have increased breathing difficulty, a high fever, a cough that lasts longer than 3 weeks, or other serious complaints.
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
## 2016-07-27 ENCOUNTER — Emergency Department (HOSPITAL_BASED_OUTPATIENT_CLINIC_OR_DEPARTMENT_OTHER)
Admission: EM | Admit: 2016-07-27 | Discharge: 2016-07-28 | Disposition: A | Discharge status: Home / Self Care | Payer: Medicare Other | Attending: Emergency Medicine | Admitting: Emergency Medicine

## 2016-07-27 ENCOUNTER — Encounter (HOSPITAL_BASED_OUTPATIENT_CLINIC_OR_DEPARTMENT_OTHER): Payer: Self-pay | Admitting: Emergency Medicine

## 2016-07-27 DIAGNOSIS — Y999 Unspecified external cause status: Secondary | ICD-10-CM | POA: Diagnosis not present

## 2016-07-27 DIAGNOSIS — Y939 Activity, unspecified: Secondary | ICD-10-CM | POA: Diagnosis not present

## 2016-07-27 DIAGNOSIS — M25552 Pain in left hip: Secondary | ICD-10-CM | POA: Insufficient documentation

## 2016-07-27 DIAGNOSIS — M5442 Lumbago with sciatica, left side: Secondary | ICD-10-CM | POA: Insufficient documentation

## 2016-07-27 DIAGNOSIS — Y929 Unspecified place or not applicable: Secondary | ICD-10-CM | POA: Insufficient documentation

## 2016-07-27 DIAGNOSIS — W000XXA Fall on same level due to ice and snow, initial encounter: Secondary | ICD-10-CM | POA: Insufficient documentation

## 2016-07-27 DIAGNOSIS — S3992XA Unspecified injury of lower back, initial encounter: Secondary | ICD-10-CM | POA: Diagnosis present

## 2016-07-27 DIAGNOSIS — Z87891 Personal history of nicotine dependence: Secondary | ICD-10-CM | POA: Diagnosis not present

## 2016-07-27 DIAGNOSIS — W19XXXA Unspecified fall, initial encounter: Secondary | ICD-10-CM

## 2016-07-27 NOTE — ED Triage Notes (Signed)
Pt fell 2 days ago after slipping on ice, injuring left lower back.  Pt has hx sciatica.  Denies loss of bladder/bowel, denies loc or head injury. Pt alert and oriented.

## 2016-07-28 ENCOUNTER — Emergency Department (HOSPITAL_BASED_OUTPATIENT_CLINIC_OR_DEPARTMENT_OTHER): Payer: Medicare Other

## 2016-07-28 MED ORDER — TRAMADOL HCL 50 MG PO TABS: 50.0000 mg | ORAL_TABLET | Freq: Four times a day (QID) | ORAL | 0 refills | Status: DC | PRN

## 2016-07-28 MED ORDER — PREDNISONE 20 MG PO TABS
40.0000 mg | ORAL_TABLET | Freq: Once | ORAL | Status: AC
  Administered 2016-07-28: 40 mg via ORAL
  Filled 2016-07-28: qty 2

## 2016-07-28 MED ORDER — PREDNISONE 20 MG PO TABS: 40.0000 mg | ORAL_TABLET | Freq: Every day | ORAL | 0 refills | Status: DC

## 2016-07-28 NOTE — Discharge Instructions (Signed)
Your x-ray showed no signs of fracture. This is likely irritation due to her sciatic pain from fall. Please take the prednisone starting tomorrow for 4 days. I'm giving a short course of pain medicine. Please continue taking Tylenol for pain. Follow up with her primary care doctor. Return to the ED if you develop any difficulties urinating, loss of your bowels or bladder, numbness in her groin, or for any reason. Please follow-up with orthopedist if your symptoms not improve.

## 2016-07-29 NOTE — ED Provider Notes (Signed)
[facility]-EMER[facility]Y DEPT Provider Note   CSN: [id number] Arrival date & time: 07/27/16  2312     History   Chief Complaint Chief Complaint  Patient presents with  . Back Pain    HPI Angelica Elliott.   41 year old Caucasian Elliott past medical history significant for morbid obesity, sciatica, chronic low back pain presents to the ED today with complaints of left hip pain and low back pain after sustaining a fall 2 days ago. Patient slipped on the ice and fell onto her left hip and low back. Patient has been ambulatory since the event. Pain is constant. Nothing makes better or worse. She is not taking anything for the pain prior to arrival. Patient denies any saddle paresthesias, loss of bowel or bladder, urinary retention, lower oximetry paresthesias, fever, history of IV drug use, history of cancer. Patient has history of sciatica and states that steroids help her. [place] and is going back next week to follow-up with her primary care doctor. Patient denies any other injuries. Patient denies hitting her head. Denies LOC. She denies any urinary symptoms.      History reviewed. No pertinent past medical history.  There are no active problems to display for this patient.   Past Surgical History:  Procedure Laterality Date  . APPENDECTOMY    . CESAREAN SECTION    . CHOLECYSTECTOMY    . GASTRIC BYPASS    . TONSILLECTOMY    . TUBAL LIGATION      OB History    No data available       Home Medications    Prior to Admission medications   Medication Sig Start Date End Date Taking? Authorizing Provider  cholecalciferol (VITAMIN D) 1000 units tablet Take 50,000 Units by mouth every Friday.   Yes Historical Provider, MD  cyclobenzaprine (FLEXERIL) 10 MG tablet Take 10 mg by mouth 3 (three) times daily as needed for muscle spasms.   Yes Historical Provider, MD  Multiple Vitamins-Minerals (MULTIVITAMIN WITH MINERALS) tablet Take 1 tablet by  mouth daily.   Yes Historical Provider, MD  predniSONE (DELTASONE) 20 MG tablet Take 2 tablets (40 mg total) by mouth daily with breakfast. 07/28/16   Rise MuKenneth T Raley Novicki, PA-C  traMADol (ULTRAM) 50 MG tablet Take 1 tablet (50 mg total) by mouth every 6 (six) hours as needed. 07/28/16   Rise MuKenneth T Casper Pagliuca, PA-C    Family History History reviewed. No pertinent family history.  Social History Social History  Substance Use Topics  . Smoking status: Former Games developermoker  . Smokeless tobacco: Not on file  . Alcohol use Yes     Comment: occ     Allergies   Amoxicillin and Penicillins   Review of Systems Review of Systems  Constitutional: Negative for chills and fever.  HENT: Negative for congestion.   Gastrointestinal: Negative for abdominal pain, constipation, diarrhea, nausea and vomiting.  Genitourinary: Negative for dysuria, flank pain, hematuria and urgency.  Musculoskeletal: Positive for arthralgias and back pain.  Skin: Negative for wound.  Neurological: Negative for dizziness, syncope, weakness, light-headedness, numbness and headaches.     Physical Exam Updated Vital Signs BP 133/91 (BP Location: Right Arm)   Pulse 81   Temp 97.6 F (36.4 C) (Oral)   Resp 19   Ht 5\' 6"  (1.676 m)   Wt (!) 172.4 kg   SpO2 100%   BMI 61.33 kg/m   Physical Exam  Constitutional: She is oriented to person, place, and  time. She appears well-developed and well-nourished. No distress.  Morbid obesity.  HENT:  Head: Normocephalic and atraumatic.  Eyes: Right eye exhibits no discharge. Left eye exhibits no discharge. No scleral icterus.  Neck: Normal range of motion. Neck supple.  No midline C-spine tenderness. No deformities or step-offs noted.  Cardiovascular: Intact distal pulses.   Pulmonary/Chest: No respiratory distress.  Musculoskeletal: Normal range of motion.  Patient with no midline L-spine or T-spine tenderness. No deformity or step-offs noted. Left paraspinal tenderness lumbar  region noted. Pain radiates to the left buttocks. Mild tenderness to palpation the left hip joint. Patient is ambulatory. Strength 5 out 5 in lower extremities. Sensation intact. Cap refill normal. DP pulses are 2+ bilaterally.  Neurological: She is alert and oriented to person, place, and time.  Strength in all 70s normal. Sensation intact.  Skin: Skin is warm and dry. Capillary refill takes less than 2 seconds. No pallor.  Nursing note and vitals reviewed.    ED Treatments / Results  Labs (all labs ordered are listed, but only abnormal results are displayed) Labs Reviewed - No data to display  EKG  EKG Interpretation None       Radiology Dg Lumbar Spine Complete  Result Date: 07/28/2016 CLINICAL DATA:  41 year old Elliott status post recent fall and back pain. EXAM: LUMBAR SPINE - COMPLETE 4+ VIEW COMPARISON:  None. FINDINGS: There is no acute fracture or subluxation of the lumbar spine. The vertebral body heights and disc spaces are maintained. There is mild endplate degenerative changes. The visualized transverse and spinous processes appear intact. Right upper quadrant cholecystectomy clips as well as an intrauterine device over the pelvis noted. The soft tissues are grossly unremarkable. IMPRESSION: No acute/ traumatic lumbar spine pathology. Electronically Signed   By: Elgie Collard M.D.   On: 07/28/2016 01:43   Dg Hip Unilat W Or Wo Pelvis 2-3 Views Left  Result Date: 07/28/2016 CLINICAL DATA:  41 y/o F; fall 2 days ago after slipping on ice. Injury to lower back. EXAM: DG HIP (WITH OR WITHOUT PELVIS) 2-3V LEFT COMPARISON:  None. FINDINGS: Ossific density adjacent to the left ischial tuberosity. Otherwise no acute fracture or dislocation identified. IUD. Pelvic phleboliths. IMPRESSION: Ossific density adjacent to the left ischial tuberosity may represent left-sided hamstring muscle avulsion fracture, correlate for pain. Electronically Signed   By: Mitzi Hansen M.D.    On: 07/28/2016 01:44    Procedures Procedures (including critical care time)  Medications Ordered in ED Medications  predniSONE (DELTASONE) tablet 40 mg (40 mg Oral Given 07/28/16 0048)     Initial Impression / Assessment and Plan / ED Course  I have reviewed the triage vital signs and the nursing notes.  Pertinent labs & imaging results that were available during my care of the patient were reviewed by me and considered in my medical decision making (see chart for details).     Patient with back pain After a mechanical fall. Patient with chronic low back pain and sciatica..  No neurological deficits and normal neuro exam.  Patient can walk but states is painful.  No loss of bowel or bladder control.  No concern for cauda equina.  No fever, night sweats, weight loss, h/o cancer, IVDU.  X-ray showed no acute abnormalities the  Lumbar spine. Questionable left-sided hamstring avulsion fracture. Patient is not tender to that location. She has marked tenderness to the lateral aspect of the knee and thigh. Patient is ambulatory with normal gait.  No other injuries noted.Patient will  be discharged home with short course of pain medicine and steroids. Patient has been encouraged to follow up with her primary care doctor. RICE protocol and pain medicine indicated and discussed with patient. Pt discussed with Dr. Elesa Massed and is agreeable to the above plan.   Final Clinical Impressions(s) / ED Diagnoses   Final diagnoses:  Fall, initial encounter  Acute left-sided low back pain with left-sided sciatica    New Prescriptions Discharge Medication List as of 07/28/2016  1:52 AM    START taking these medications   Details  predniSONE (DELTASONE) 20 MG tablet Take 2 tablets (40 mg total) by mouth daily with breakfast., Starting Mon 07/28/2016, Print    traMADol (ULTRAM) 50 MG tablet Take 1 tablet (50 mg total) by mouth every 6 (six) hours as needed., Starting Mon 07/28/2016, Print         Rise Mu, PA-C 07/29/16 0249    Layla Maw Ward, DO 07/29/16 1610

## 2016-08-03 ENCOUNTER — Emergency Department (HOSPITAL_BASED_OUTPATIENT_CLINIC_OR_DEPARTMENT_OTHER)
Admission: EM | Admit: 2016-08-03 | Discharge: 2016-08-03 | Disposition: A | Discharge status: Home / Self Care | Payer: Medicare Other | Attending: Emergency Medicine | Admitting: Emergency Medicine

## 2016-08-03 ENCOUNTER — Encounter (HOSPITAL_BASED_OUTPATIENT_CLINIC_OR_DEPARTMENT_OTHER): Payer: Self-pay | Admitting: Emergency Medicine

## 2016-08-03 DIAGNOSIS — Y939 Activity, unspecified: Secondary | ICD-10-CM | POA: Insufficient documentation

## 2016-08-03 DIAGNOSIS — Z87891 Personal history of nicotine dependence: Secondary | ICD-10-CM | POA: Insufficient documentation

## 2016-08-03 DIAGNOSIS — M545 Low back pain: Secondary | ICD-10-CM | POA: Insufficient documentation

## 2016-08-03 DIAGNOSIS — Z79899 Other long term (current) drug therapy: Secondary | ICD-10-CM | POA: Diagnosis not present

## 2016-08-03 DIAGNOSIS — Y929 Unspecified place or not applicable: Secondary | ICD-10-CM | POA: Insufficient documentation

## 2016-08-03 DIAGNOSIS — W010XXA Fall on same level from slipping, tripping and stumbling without subsequent striking against object, initial encounter: Secondary | ICD-10-CM | POA: Diagnosis not present

## 2016-08-03 DIAGNOSIS — S3992XA Unspecified injury of lower back, initial encounter: Secondary | ICD-10-CM | POA: Diagnosis present

## 2016-08-03 DIAGNOSIS — Y999 Unspecified external cause status: Secondary | ICD-10-CM | POA: Insufficient documentation

## 2016-08-03 HISTORY — DX: Obesity, unspecified: E66.9

## 2016-08-03 HISTORY — DX: Dorsalgia, unspecified: M54.9

## 2016-08-03 MED ORDER — IBUPROFEN 800 MG PO TABS: 800.0000 mg | ORAL_TABLET | Freq: Three times a day (TID) | ORAL | 0 refills | Status: DC

## 2016-08-03 MED ORDER — DEXAMETHASONE SODIUM PHOSPHATE 10 MG/ML IJ SOLN
10.0000 mg | Freq: Once | INTRAMUSCULAR | Status: AC
  Administered 2016-08-03: 10 mg via INTRAMUSCULAR
  Filled 2016-08-03: qty 1

## 2016-08-03 MED ORDER — KETOROLAC TROMETHAMINE 60 MG/2ML IM SOLN
30.0000 mg | Freq: Once | INTRAMUSCULAR | Status: AC
  Administered 2016-08-03: 30 mg via INTRAMUSCULAR
  Filled 2016-08-03: qty 2

## 2016-08-03 NOTE — ED Notes (Signed)
Please recheck BP. 95/59 in waiting room.

## 2016-08-03 NOTE — Discharge Instructions (Signed)
Take your medications as prescribed. Follow-up with your doctor. Return to ED for new or worsening symptoms as we discussed.

## 2016-08-03 NOTE — ED Triage Notes (Signed)
Fell on ice about 10 days ago and was eval for same in ED on 07/27/16 and states pain to lower back  is worse.  Denies B/B difficulty

## 2016-08-03 NOTE — ED Provider Notes (Signed)
[facility]   CSN: [id number] Arrival date & time: 08/03/16  1307     History   Chief Complaint Chief Complaint  Patient presents with  . Back Pain    HPI Angelica Elliott is a 41 y.o. female.  HPI here for evaluation of left-sided low back pain. Patient reports she was seen in the ED for a fall that she sustained 1/19-states she slipped in the snow and landed on her left buttock and low back. She has been taking prednisone and tramadol with minimal relief. She has not been taking any other medications to improve her symptoms. She denies any fevers, chills, numbness or weakness, changes in bowel or bladder habits, personal history of cancer, IV drug use. Reports she can walk but it is painful.   Past Medical History:  Diagnosis Date  . Back pain   . Obesity     There are no active problems to display for this patient.   Past Surgical History:  Procedure Laterality Date  . APPENDECTOMY    . CESAREAN SECTION    . CHOLECYSTECTOMY    . GASTRIC BYPASS    . TONSILLECTOMY    . TUBAL LIGATION      OB History    No data available       Home Medications    Prior to Admission medications   Medication Sig Start Date End Date Taking? Authorizing Provider  cholecalciferol (VITAMIN D) 1000 units tablet Take 50,000 Units by mouth every Friday.   Yes Historical Provider, MD  cyclobenzaprine (FLEXERIL) 10 MG tablet Take 10 mg by mouth 3 (three) times daily as needed for muscle spasms.   Yes Historical Provider, MD  Multiple Vitamins-Minerals (MULTIVITAMIN WITH MINERALS) tablet Take 1 tablet by mouth daily.   Yes Historical Provider, MD  ibuprofen (ADVIL,MOTRIN) 800 MG tablet Take 1 tablet (800 mg total) by mouth 3 (three) times daily. 08/03/16   Joycie PeekBenjamin Wojciech Willetts, PA-C  predniSONE (DELTASONE) 20 MG tablet Take 2 tablets (40 mg total) by mouth daily with breakfast. 07/28/16   Rise MuKenneth T Leaphart, PA-C  traMADol (ULTRAM) 50 MG tablet Take 1 tablet (50 mg total)  by mouth every 6 (six) hours as needed. 07/28/16   Rise MuKenneth T Leaphart, PA-C    Family History No family history on file.  Social History Social History  Substance Use Topics  . Smoking status: Former Games developermoker  . Smokeless tobacco: Never Used  . Alcohol use No     Allergies   Amoxicillin and Penicillins   Review of Systems Review of Systems  See history of present illness Physical Exam Updated Vital Signs BP 96/59 (BP Location: Right Arm)   Pulse 97   Temp 98.2 F (36.8 C) (Oral)   Resp 20   Ht 5\' 6"  (1.676 m)   Wt (!) 172.4 kg   SpO2 96%   BMI 61.33 kg/m   Physical Exam  Constitutional: She appears well-developed and well-nourished. No distress.  Morbidly obese.  HENT:  Head: Normocephalic and atraumatic.  Eyes: Conjunctivae and EOM are normal. Right eye exhibits no discharge. Left eye exhibits no discharge. No scleral icterus.  Neck: Normal range of motion. Neck supple.  Pulmonary/Chest: Effort normal. No respiratory distress.  Abdominal: Soft. She exhibits no distension. There is no tenderness.  Musculoskeletal: Normal range of motion.  Diffuse tenderness in left lumbar paraspinal musculature. No focal midline bony or spinous process tenderness. No crepitus, tenting, rash or other skin abnormality. Full active range of motion  of CTL spine. Full active range of motion of all extremities. Able to actively flex left hamstring without any pain or difficulty.  Neurological:  Motor strength and sensation appear baseline for patient. Able to stand on tiptoe, dorsiflex great toe. Gait baseline, mildly antalgic.  Skin: She is not diaphoretic.     ED Treatments / Results  Labs (all labs ordered are listed, but only abnormal results are displayed) Labs Reviewed - No data to display  EKG  EKG Interpretation None       Radiology No results found.  Procedures Procedures (including critical care time)  Medications Ordered in ED Medications  ketorolac (TORADOL)  injection 30 mg (30 mg Intramuscular Given 08/03/16 1540)  dexamethasone (DECADRON) injection 10 mg (10 mg Intramuscular Given 08/03/16 1540)     Initial Impression / Assessment and Plan / ED Course  I have reviewed the triage vital signs and the nursing notes.  Pertinent labs & imaging results that were available during my care of the patient were reviewed by me and considered in my medical decision making (see chart for details).     Patient with back pain.  No neurological deficits and normal neuro exam.  Patient can walk but states is painful.  No loss of bowel or bladder control.  No concern for cauda equina.  No fever, night sweats, weight loss, h/o cancer, IVDU. Patient had plain films of pelvis and lumbar spine on 1/22 that were negative for acute fracture. RICE protocol and NSAIDS indicated and discussed with patient. Given IM Decadron and Toradol in clinic, prescription for Toradol home. Follow up with PCP. Return precautions discussed   Final Clinical Impressions(s) / ED Diagnoses   Final diagnoses:  Left low back pain, unspecified chronicity, with sciatica presence unspecified    New Prescriptions New Prescriptions   IBUPROFEN (ADVIL,MOTRIN) 800 MG TABLET    Take 1 tablet (800 mg total) by mouth 3 (three) times daily.     Joycie Peek, PA-C 08/03/16 1604    Alvira Monday, MD 08/05/16 [phone]

## 2016-08-16 ENCOUNTER — Encounter (HOSPITAL_BASED_OUTPATIENT_CLINIC_OR_DEPARTMENT_OTHER): Payer: Self-pay

## 2016-08-16 ENCOUNTER — Emergency Department (HOSPITAL_BASED_OUTPATIENT_CLINIC_OR_DEPARTMENT_OTHER)
Admission: EM | Admit: 2016-08-16 | Discharge: 2016-08-16 | Disposition: A | Discharge status: Home / Self Care | Payer: Medicare Other | Attending: Emergency Medicine | Admitting: Emergency Medicine

## 2016-08-16 DIAGNOSIS — M7989 Other specified soft tissue disorders: Secondary | ICD-10-CM | POA: Diagnosis present

## 2016-08-16 DIAGNOSIS — Z791 Long term (current) use of non-steroidal anti-inflammatories (NSAID): Secondary | ICD-10-CM | POA: Diagnosis not present

## 2016-08-16 DIAGNOSIS — Z87891 Personal history of nicotine dependence: Secondary | ICD-10-CM | POA: Diagnosis not present

## 2016-08-16 DIAGNOSIS — R600 Localized edema: Secondary | ICD-10-CM | POA: Insufficient documentation

## 2016-08-16 DIAGNOSIS — R609 Edema, unspecified: Secondary | ICD-10-CM

## 2016-08-16 LAB — CBC WITH DIFFERENTIAL/PLATELET
BASOS PCT: 0 %
Basophils Absolute: 0 10*3/uL (ref 0.0–0.1)
Eosinophils Absolute: 0.2 10*3/uL (ref 0.0–0.7)
Eosinophils Relative: 1 %
HEMATOCRIT: 40.2 % (ref 36.0–46.0)
HEMOGLOBIN: 12.7 g/dL (ref 12.0–15.0)
LYMPHS PCT: 24 %
Lymphs Abs: 2.5 10*3/uL (ref 0.7–4.0)
MCH: 29.2 pg (ref 26.0–34.0)
MCHC: 31.6 g/dL (ref 30.0–36.0)
MCV: 92.4 fL (ref 78.0–100.0)
MONO ABS: 0.5 10*3/uL (ref 0.1–1.0)
MONOS PCT: 5 %
NEUTROS ABS: 7.3 10*3/uL (ref 1.7–7.7)
NEUTROS PCT: 70 %
Platelets: 282 10*3/uL (ref 150–400)
RBC: 4.35 MIL/uL (ref 3.87–5.11)
RDW: 13.6 % (ref 11.5–15.5)
WBC: 10.5 10*3/uL (ref 4.0–10.5)

## 2016-08-16 LAB — BASIC METABOLIC PANEL
ANION GAP: 5 (ref 5–15)
BUN: 12 mg/dL (ref 6–20)
CHLORIDE: 106 mmol/L (ref 101–111)
CO2: 26 mmol/L (ref 22–32)
Calcium: 8.3 mg/dL — ABNORMAL LOW (ref 8.9–10.3)
Creatinine, Ser: 0.67 mg/dL (ref 0.44–1.00)
GFR calc non Af Amer: >60 mL/min (ref >60)
GLUCOSE: 93 mg/dL (ref 65–99)
POTASSIUM: 3.8 mmol/L (ref 3.5–5.1)
Sodium: 137 mmol/L (ref 135–145)

## 2016-08-16 MED ORDER — FUROSEMIDE 10 MG/ML IJ SOLN
20.0000 mg | Freq: Once | INTRAMUSCULAR | Status: AC
  Administered 2016-08-16: 20 mg via INTRAVENOUS
  Filled 2016-08-16: qty 2

## 2016-08-16 MED ORDER — HYDROCODONE-ACETAMINOPHEN 5-325 MG PO TABS
2.0000 | ORAL_TABLET | Freq: Once | ORAL | Status: AC
  Administered 2016-08-16: 2 via ORAL
  Filled 2016-08-16: qty 2

## 2016-08-16 MED ORDER — FUROSEMIDE 20 MG PO TABS: 20.0000 mg | ORAL_TABLET | Freq: Every day | ORAL | 0 refills | Status: DC | PRN

## 2016-08-16 NOTE — ED Provider Notes (Signed)
[facility] Provider Note: Angelica DellJ. Lane Nazanin Kinner, MD, FACEP  CSN: [id number] MRN: [id number] ARRIVAL: 08/16/16 at 0132 ROOM: [facility]   CHIEF COMPLAINT  Foot Swelling   HISTORY OF PRESENT ILLNESS  Angelica Elliott is a 41 y.o. female with a history of morbid obesity. She recently had a URI with nasal congestion, scratchy throat and cough. Symptoms were not severe. She is here with 2 days of swelling to her feet bilaterally. The swelling has caused the skin to hurt and she describes the pain in her feet is severe. Pain is worse with palpation or attempted ambulation. She is not currently on Lasix but has taken Lasix for edema in the past. She denies anasarca. She has also had some wheezing associated with her URI but none presently.   Past Medical History:  Diagnosis Date  . Back pain   . Obesity     Past Surgical History:  Procedure Laterality Date  . APPENDECTOMY    . CESAREAN SECTION    . CHOLECYSTECTOMY    . GASTRIC BYPASS    . TONSILLECTOMY    . TUBAL LIGATION      No family history on file.  Social History  Substance Use Topics  . Smoking status: Former Games developermoker  . Smokeless tobacco: Never Used  . Alcohol use No    Prior to Admission medications   Medication Sig Start Date End Date Taking? Authorizing Provider  cholecalciferol (VITAMIN D) 1000 units tablet Take 50,000 Units by mouth every Friday.    Historical Provider, MD  cyclobenzaprine (FLEXERIL) 10 MG tablet Take 10 mg by mouth 3 (three) times daily as needed for muscle spasms.    Historical Provider, MD  ibuprofen (ADVIL,MOTRIN) 800 MG tablet Take 1 tablet (800 mg total) by mouth 3 (three) times daily. 08/03/16   Angelica PeekBenjamin Cartner, PA-C  Multiple Vitamins-Minerals (MULTIVITAMIN WITH MINERALS) tablet Take 1 tablet by mouth daily.    Historical Provider, MD  predniSONE (DELTASONE) 20 MG tablet Take 2 tablets (40 mg total) by mouth daily with breakfast. 07/28/16   Angelica MuKenneth T Leaphart, PA-C  traMADol (ULTRAM) 50  MG tablet Take 1 tablet (50 mg total) by mouth every 6 (six) hours as needed. 07/28/16   Angelica MuKenneth T Leaphart, PA-C    Allergies Amoxicillin and Penicillins   REVIEW OF SYSTEMS  Negative except as noted here or in the History of Present Illness.   PHYSICAL EXAMINATION  Initial Vital Signs Blood pressure (!) 159/102, temperature 98.2 F (36.8 C), temperature source Oral, resp. rate 18, height 5\' 6"  (1.676 m), weight (!) 340 lb (154.2 kg).  Examination General: Well-developed, morbidly obese female in no acute distress; appearance consistent with age of record HENT: normocephalic; atraumatic Eyes: pupils equal, round and reactive to light; extraocular muscles intact Neck: supple Heart: regular rate and rhythm Lungs: clear to auscultation bilaterally Abdomen: soft; obese; nontender; bowel sounds present Extremities: No deformity; full range of motion; 2+ pitting edema and tenderness of ankles and feet without erythema or warmth Neurologic: Awake, alert and oriented; motor function intact in all extremities and symmetric; no facial droop Skin: Warm and dry Psychiatric: Normal mood and affect   RESULTS  Summary of this visit's results, reviewed by myself:   EKG Interpretation  Date/Time:    Ventricular Rate:    PR Interval:    QRS Duration:   QT Interval:    QTC Calculation:   R Axis:     Text Interpretation:        Laboratory  Studies: Results for orders placed or performed during the hospital encounter of 08/16/16 (from the past 24 hour(s))  Basic metabolic panel     Status: Abnormal   Collection Time: 08/16/16  4:42 AM  Result Value Ref Range   Sodium 137 135 - 145 mmol/L   Potassium 3.8 3.5 - 5.1 mmol/L   Chloride 106 101 - 111 mmol/L   CO2 26 22 - 32 mmol/L   Glucose, Bld 93 65 - 99 mg/dL   BUN 12 6 - 20 mg/dL   Creatinine, Ser 9.60 0.44 - 1.00 mg/dL   Calcium 8.3 (L) 8.9 - 10.3 mg/dL   GFR calc non Af Amer >60 >60 mL/min   GFR calc Af Amer >60 >60 mL/min    Anion gap 5 5 - 15  CBC with Differential/Platelet     Status: None   Collection Time: 08/16/16  4:42 AM  Result Value Ref Range   WBC 10.5 4.0 - 10.5 K/uL   RBC 4.35 3.87 - 5.11 MIL/uL   Hemoglobin 12.7 12.0 - 15.0 g/dL   HCT 45.4 09.8 - 11.9 %   MCV 92.4 78.0 - 100.0 fL   MCH 29.2 26.0 - 34.0 pg   MCHC 31.6 30.0 - 36.0 g/dL   RDW 14.7 82.9 - 56.2 %   Platelets 282 150 - 400 K/uL   Neutrophils Relative % 70 %   Neutro Abs 7.3 1.7 - 7.7 K/uL   Lymphocytes Relative 24 %   Lymphs Abs 2.5 0.7 - 4.0 K/uL   Monocytes Relative 5 %   Monocytes Absolute 0.5 0.1 - 1.0 K/uL   Eosinophils Relative 1 %   Eosinophils Absolute 0.2 0.0 - 0.7 K/uL   Basophils Relative 0 %   Basophils Absolute 0.0 0.0 - 0.1 K/uL   Imaging Studies: No results found.  ED COURSE  Nursing notes and initial vitals signs, including pulse oximetry, reviewed.  Vitals:   08/16/16 0150 08/16/16 0151  BP: (!) 159/102   Resp: 18   Temp: 98.2 F (36.8 C)   TempSrc: Oral   Weight:  (!) 340 lb (154.2 kg)  Height:  5\' 6"  (1.676 m)   5:05 AM Lasix 20 milligrams IV given. Patient advised of reassuring lab values.  PROCEDURES    ED DIAGNOSES     ICD-9-CM ICD-10-CM   1. Peripheral edema 782.3 R60.9        Angelica Libra, MD 08/16/16 [phone]

## 2016-08-16 NOTE — ED Triage Notes (Signed)
Pt c/o swelling and severe pain to bilateral feet since yesterday, states had a cold and sore throat last week

## 2016-08-25 ENCOUNTER — Emergency Department (HOSPITAL_BASED_OUTPATIENT_CLINIC_OR_DEPARTMENT_OTHER): Payer: Medicare Other

## 2016-08-25 ENCOUNTER — Encounter (HOSPITAL_BASED_OUTPATIENT_CLINIC_OR_DEPARTMENT_OTHER): Payer: Self-pay | Admitting: Emergency Medicine

## 2016-08-25 DIAGNOSIS — S9032XA Contusion of left foot, initial encounter: Secondary | ICD-10-CM | POA: Insufficient documentation

## 2016-08-25 DIAGNOSIS — Z87891 Personal history of nicotine dependence: Secondary | ICD-10-CM | POA: Insufficient documentation

## 2016-08-25 DIAGNOSIS — Y999 Unspecified external cause status: Secondary | ICD-10-CM | POA: Insufficient documentation

## 2016-08-25 DIAGNOSIS — Y929 Unspecified place or not applicable: Secondary | ICD-10-CM | POA: Diagnosis not present

## 2016-08-25 DIAGNOSIS — W19XXXA Unspecified fall, initial encounter: Secondary | ICD-10-CM | POA: Insufficient documentation

## 2016-08-25 DIAGNOSIS — Y939 Activity, unspecified: Secondary | ICD-10-CM | POA: Insufficient documentation

## 2016-08-25 DIAGNOSIS — S99922A Unspecified injury of left foot, initial encounter: Secondary | ICD-10-CM | POA: Diagnosis present

## 2016-08-25 NOTE — ED Triage Notes (Signed)
Pt c/o swelling and severe pain to bilateral feet since  She was here last - reports that she out of her lasix now

## 2016-08-26 ENCOUNTER — Emergency Department (HOSPITAL_BASED_OUTPATIENT_CLINIC_OR_DEPARTMENT_OTHER)
Admission: EM | Admit: 2016-08-26 | Discharge: 2016-08-26 | Disposition: A | Discharge status: Home / Self Care | Payer: Medicare Other | Attending: Emergency Medicine | Admitting: Emergency Medicine

## 2016-08-26 ENCOUNTER — Encounter (HOSPITAL_BASED_OUTPATIENT_CLINIC_OR_DEPARTMENT_OTHER): Payer: Self-pay | Admitting: Emergency Medicine

## 2016-08-26 DIAGNOSIS — S9032XA Contusion of left foot, initial encounter: Secondary | ICD-10-CM | POA: Diagnosis not present

## 2016-08-26 DIAGNOSIS — S9030XA Contusion of unspecified foot, initial encounter: Secondary | ICD-10-CM

## 2016-08-26 MED ORDER — FUROSEMIDE 20 MG PO TABS: 20.0000 mg | ORAL_TABLET | Freq: Every day | ORAL | 0 refills | Status: DC

## 2016-08-26 MED ORDER — IBUPROFEN 800 MG PO TABS: 800.0000 mg | ORAL_TABLET | Freq: Three times a day (TID) | ORAL | 0 refills | Status: AC

## 2016-08-26 MED ORDER — IBUPROFEN 800 MG PO TABS
800.0000 mg | ORAL_TABLET | Freq: Once | ORAL | Status: AC
  Administered 2016-08-26: 800 mg via ORAL
  Filled 2016-08-26: qty 1

## 2016-08-26 NOTE — ED Notes (Signed)
EDP into room, prior to RN assessment, see MD notes, care assumed at time of d/c, orders received and initiated.

## 2016-08-26 NOTE — ED Provider Notes (Signed)
[facility] Provider Note   CSN: [id number] Arrival date & time: 08/25/16  2324  By signing my name below, I, Rosario AdieWilliam Andrew Hiatt, attest that this documentation has been prepared under the direction and in the presence of Shelton Soler, MD. Electronically Signed: Rosario AdieWilliam Andrew Hiatt, ED Scribe. 08/26/16. 1:03 AM.  History   Chief Complaint Chief Complaint  Patient presents with  . Foot Pain   The history is provided by the patient. No language interpreter was used.  Foot Pain  This is a chronic problem. The current episode started more than 1 week ago. The problem occurs constantly. The problem has not changed since onset.Pertinent negatives include no chest pain, no abdominal pain, no headaches and no shortness of breath. The symptoms are aggravated by walking. Nothing relieves the symptoms. Treatments tried: Lasix. The treatment provided no relief.    HPI Comments: Angelica JuryJulia R Elliott is a 41 y.o. female with a h/o obesity, who presents to the Emergency Department complaining of persistent bilateral feet swelling and pain beginning several weeks ago. Pt was seen in the ED for this issue on 08/16/16 (~10 days ago) and at that time she was placed on Lasix. She has been taking this without relief of her swelling. She also notes that she sustained a ground-level, mechanical fall yesterday, sustaining pain to the left knee, and worsening her bilateral feet pain. Pain is exacerbated with ambulation. No noted treatments for her pain were tried prior to coming into the ED. She denies syncope, head injury, numbness, or any other associated symptoms.Tetanus is UTD.   Past Medical History:  Diagnosis Date  . Back pain   . Obesity     There are no active problems to display for this patient.   Past Surgical History:  Procedure Laterality Date  . APPENDECTOMY    . CESAREAN SECTION    . CHOLECYSTECTOMY    . GASTRIC BYPASS    . TONSILLECTOMY    . TUBAL LIGATION      OB History      No data available       Home Medications    Prior to Admission medications   Medication Sig Start Date End Date Taking? Authorizing Provider  cholecalciferol (VITAMIN D) 1000 units tablet Take 50,000 Units by mouth every Friday.    Historical Provider, MD  cyclobenzaprine (FLEXERIL) 10 MG tablet Take 10 mg by mouth 3 (three) times daily as needed for muscle spasms.    Historical Provider, MD  furosemide (LASIX) 20 MG tablet Take 1-2 tablets (20-40 mg total) by mouth daily as needed for edema. 08/16/16   John Molpus, MD  ibuprofen (ADVIL,MOTRIN) 800 MG tablet Take 1 tablet (800 mg total) by mouth 3 (three) times daily. 08/03/16   Joycie PeekBenjamin Cartner, PA-C  Multiple Vitamins-Minerals (MULTIVITAMIN WITH MINERALS) tablet Take 1 tablet by mouth daily.    Historical Provider, MD  predniSONE (DELTASONE) 20 MG tablet Take 2 tablets (40 mg total) by mouth daily with breakfast. 07/28/16   Rise MuKenneth T Leaphart, PA-C  traMADol (ULTRAM) 50 MG tablet Take 1 tablet (50 mg total) by mouth every 6 (six) hours as needed. 07/28/16   Rise MuKenneth T Leaphart, PA-C    Family History History reviewed. No pertinent family history.  Social History Social History  Substance Use Topics  . Smoking status: Former Games developermoker  . Smokeless tobacco: Never Used  . Alcohol use No     Allergies   Amoxicillin and Penicillins   Review of Systems Review of Systems  Constitutional: Negative for fatigue and fever.  Respiratory: Negative for shortness of breath.   Cardiovascular: Negative for chest pain.  Gastrointestinal: Negative for abdominal pain.  Musculoskeletal: Positive for arthralgias (bilateral feet and left knee) and myalgias. Negative for back pain, gait problem and joint swelling.  Neurological: Negative for syncope, numbness and headaches.  All other systems reviewed and are negative.  Physical Exam Updated Vital Signs BP 138/78 (BP Location: Right Wrist)   Pulse 80   Temp 98.2 F (36.8 C) (Oral)   Resp 19    Ht 5\' 4"  (1.626 m)   Wt (!) 340 lb (154.2 kg)   SpO2 100%   BMI 58.36 kg/m   Physical Exam  Constitutional: She appears well-developed and well-nourished.  HENT:  Head: Normocephalic.  Mouth/Throat: Oropharynx is clear and moist. No oropharyngeal exudate.  Eyes: Conjunctivae and EOM are normal. Pupils are equal, round, and reactive to light. Right eye exhibits no discharge. Left eye exhibits no discharge. No scleral icterus.  Neck: Normal range of motion. Neck supple. No JVD present. No tracheal deviation present.  Trachea is midline. No stridor or carotid bruits.   Cardiovascular: Normal rate, regular rhythm, normal heart sounds and intact distal pulses.   No murmur heard. Pulmonary/Chest: Effort normal and breath sounds normal. No stridor. No respiratory distress. She has no wheezes. She has no rales.  Lungs CTA bilaterally.  Abdominal: Soft. Bowel sounds are normal. She exhibits no distension. There is no tenderness. There is no rebound and no guarding.  Musculoskeletal: Normal range of motion. She exhibits no edema or tenderness.  Negative anterior and posterior drawer testing. No laxity or rigidity of the knee. No patella alta or baja. Intact achilles tendon.    Lymphadenopathy:    She has no cervical adenopathy.  Neurological: She is alert. She has normal reflexes. She displays normal reflexes. She exhibits normal muscle tone.  Skin: Skin is warm and dry. Capillary refill takes less than 2 seconds.  Psychiatric: She has a normal mood and affect. Her behavior is normal.  Nursing note and vitals reviewed.    ED Treatments / Results  DIAGNOSTIC STUDIES: Oxygen Saturation is 100% on RA, normal by my interpretation.   COORDINATION OF CARE: 12:57 AM-Discussed next steps with pt. Pt verbalized understanding and is agreeable with the plan.   Labs (all labs ordered are listed, but only abnormal results are displayed) Labs Reviewed - No data to display  EKG  EKG  Interpretation None       Radiology Dg Foot Complete Left  Result Date: 08/26/2016 CLINICAL DATA:  Status post fall, with injury to left foot. Dorsal left foot swelling. Initial encounter. EXAM: LEFT FOOT - COMPLETE 3+ VIEW COMPARISON:  Left foot radiographs performed 10/25/2004 FINDINGS: There is no evidence of fracture or dislocation. The joint spaces are preserved. There is no evidence of talar subluxation; the subtalar joint is unremarkable in appearance. Plantar and posterior calcaneal spurs are seen. An os trigonum is noted. Diffuse dorsal soft tissue swelling is noted along the entirety of the foot and ankle. IMPRESSION: 1. No evidence of acute fracture or dislocation. 2. Diffuse dorsal soft tissue swelling along the entirety of the foot and ankle. 3. Os trigonum noted. Electronically Signed   By: Roanna Raider M.D.   On: 08/26/2016 00:24    Procedures Procedures (including critical care time)  Medications Ordered in ED  Medications  ibuprofen (ADVIL,MOTRIN) tablet 800 mg (800 mg Oral Given 08/26/16 0134)  Final Clinical Impressions(s) / ED Diagnoses  Foot pain:  All questions answered to patient's satisfaction. Based on history and exam patient has been appropriately medically screened and emergency conditions excluded. Patient is stable for discharge at this time. Strict return precautions given for chest pain, shortness of breath, tachycardia, weakness, numbness, neck pain shoulder pain passing out, shortness of breath, difficulty speaking or swallowing or any further problems or concerns New Prescriptions New Prescriptions New Prescriptions   No medications on file   I personally performed the services described in this documentation, which was scribed in my presence. The recorded information has been reviewed and is accurate.      Cy Blamer, MD 08/26/16 [phone]

## 2016-08-30 ENCOUNTER — Encounter (HOSPITAL_BASED_OUTPATIENT_CLINIC_OR_DEPARTMENT_OTHER): Payer: Self-pay | Admitting: Emergency Medicine

## 2016-08-30 DIAGNOSIS — Z87891 Personal history of nicotine dependence: Secondary | ICD-10-CM | POA: Diagnosis not present

## 2016-08-30 DIAGNOSIS — Z79899 Other long term (current) drug therapy: Secondary | ICD-10-CM | POA: Insufficient documentation

## 2016-08-30 DIAGNOSIS — R21 Rash and other nonspecific skin eruption: Secondary | ICD-10-CM | POA: Insufficient documentation

## 2016-08-30 NOTE — ED Triage Notes (Signed)
Has had a rash to chin x 2 days, ? Cause . Also wants to be seen for chronic swelling to left leg.

## 2016-08-31 ENCOUNTER — Emergency Department (HOSPITAL_BASED_OUTPATIENT_CLINIC_OR_DEPARTMENT_OTHER)
Admission: EM | Admit: 2016-08-31 | Discharge: 2016-08-31 | Disposition: A | Discharge status: Home / Self Care | Payer: Medicare Other | Attending: Emergency Medicine | Admitting: Emergency Medicine

## 2016-08-31 DIAGNOSIS — R21 Rash and other nonspecific skin eruption: Secondary | ICD-10-CM | POA: Diagnosis not present

## 2016-08-31 MED ORDER — MUPIROCIN 2 % EX OINT
TOPICAL_OINTMENT | CUTANEOUS | Status: AC
  Filled 2016-08-31: qty 22

## 2016-08-31 MED ORDER — MUPIROCIN CALCIUM 2 % EX CREA
TOPICAL_CREAM | Freq: Three times a day (TID) | CUTANEOUS | Status: DC
  Administered 2016-08-31: 02:00:00 via TOPICAL
  Filled 2016-08-31: qty 15

## 2016-08-31 NOTE — ED Notes (Signed)
Pt given d/c instructions as per chart. Verbalizes understanding. No questions. 

## 2016-08-31 NOTE — ED Provider Notes (Signed)
[facility] Provider Note: Angelica Elliott Danali Marinos, MD, FACEP  CSN: [id number] MRN: [id number] ARRIVAL: 08/30/16 at 2132 ROOM: MH02/MH02   CHIEF COMPLAINT  Rash   HISTORY OF PRESENT ILLNESS  Angelica Elliott is a 41 y.o. female who states she was licked in the face by a friend's dog 2 or 3 days ago. She has subsequently developed apparent oral rash. The rash is crusted and painful. She has been applying an unspecified corticosteroid cream which she has for her eczema. She has also been seen in the ED several times recently for swelling of the lower extremities. She has had Doppler ultrasounds that showed no evidence of DVT. She has persistent pain and swelling of the left foot for which she has been taking ibuprofen but is reticent to continue taking ibuprofen due to her gastric bypass bariatric surgery.   Past Medical History:  Diagnosis Date  . Back pain   . Obesity     Past Surgical History:  Procedure Laterality Date  . APPENDECTOMY    . CESAREAN SECTION    . CHOLECYSTECTOMY    . GASTRIC BYPASS    . TONSILLECTOMY    . TUBAL LIGATION      No family history on file.  Social History  Substance Use Topics  . Smoking status: Former Games developermoker  . Smokeless tobacco: Never Used  . Alcohol use No    Prior to Admission medications   Medication Sig Start Date End Date Taking? Authorizing Provider  cholecalciferol (VITAMIN D) 1000 units tablet Take 50,000 Units by mouth every Friday.   Yes Historical Provider, MD  ibuprofen (ADVIL,MOTRIN) 800 MG tablet Take 1 tablet (800 mg total) by mouth 3 (three) times daily. 08/26/16  Yes April Palumbo, MD  Multiple Vitamins-Minerals (MULTIVITAMIN WITH MINERALS) tablet Take 1 tablet by mouth daily.   Yes Historical Provider, MD    Allergies Amoxicillin and Penicillins   REVIEW OF SYSTEMS  Negative except as noted here or in the History of Present Illness.   PHYSICAL EXAMINATION  Initial Vital Signs Blood pressure (!) 150/102, pulse  88, temperature 98.5 F (36.9 C), temperature source Oral, resp. rate 20, height 5\' 4"  (1.626 m), weight (!) 423 lb (191.9 kg), SpO2 99 %.  Examination General: Well-developed, morbidly obese female in no acute distress; appearance consistent with age of record HENT: normocephalic; atraumatic; crusted per oral at rash:  No pharyngeal erythema or exudate Eyes: pupils equal, round and reactive to light; extraocular muscles intact Neck: supple Heart: regular rate and rhythm Lungs: clear to auscultation bilaterally Abdomen: soft; obese; nontender Extremities: No deformity; full range of motion; mild edema and tenderness of the dorsal left foot Neurologic: Awake, alert and oriented; motor function intact in all extremities and symmetric; no facial droop Skin: Warm and dry Psychiatric: Normal mood and affect   RESULTS  Summary of this visit's results, reviewed by myself:   EKG Interpretation  Date/Time:    Ventricular Rate:    PR Interval:    QRS Duration:   QT Interval:    QTC Calculation:   R Axis:     Text Interpretation:        Laboratory Studies: No results found for this or any previous visit (from the past 24 hour(s)). Imaging Studies: No results found.  ED COURSE  Nursing notes and initial vitals signs, including pulse oximetry, reviewed.  Vitals:   08/30/16 2146 08/31/16 0014  BP: 121/78 (!) 150/102  Pulse: 102 88  Resp: 20 20  Temp:  98.1 F (36.7 C) 98.5 F (36.9 C)  TempSrc: Oral Oral  SpO2: 97% 99%  Weight: (!) 423 lb (191.9 kg)   Height: 5\' 4"  (1.626 m)    1:57 AM The rash is not classic for contact dermatitis. The patient was advised to discontinue the steroid cream as this would exacerbate a rash of an infectious etiology, especially herpes. We will treat with topical mupirocin. The patient is planning to move back to [facility] next week where she will follow-up with her primary care physician.  PROCEDURES    ED DIAGNOSES     ICD-9-CM ICD-10-CM     1. Facial rash 782.1 R21        Paula Libra, MD 08/31/16 [phone]

## 2016-08-31 NOTE — ED Notes (Signed)
Pt states she was licked in the mouth by a dog and noticed she was breaking out the next day. Presents with reddened areas around the mouth. +itching. Hx eczema.

## 2016-09-07 ENCOUNTER — Emergency Department (HOSPITAL_BASED_OUTPATIENT_CLINIC_OR_DEPARTMENT_OTHER)
Admission: EM | Admit: 2016-09-07 | Discharge: 2016-09-08 | Disposition: A | Discharge status: Home / Self Care | Payer: Medicare Other | Attending: Emergency Medicine | Admitting: Emergency Medicine

## 2016-09-07 ENCOUNTER — Encounter (HOSPITAL_BASED_OUTPATIENT_CLINIC_OR_DEPARTMENT_OTHER): Payer: Self-pay | Admitting: Emergency Medicine

## 2016-09-07 DIAGNOSIS — F1010 Alcohol abuse, uncomplicated: Secondary | ICD-10-CM | POA: Insufficient documentation

## 2016-09-07 DIAGNOSIS — F10129 Alcohol abuse with intoxication, unspecified: Secondary | ICD-10-CM | POA: Diagnosis present

## 2016-09-07 DIAGNOSIS — Z87891 Personal history of nicotine dependence: Secondary | ICD-10-CM | POA: Diagnosis not present

## 2016-09-07 DIAGNOSIS — L98491 Non-pressure chronic ulcer of skin of other sites limited to breakdown of skin: Secondary | ICD-10-CM | POA: Insufficient documentation
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
## 2016-09-08 DIAGNOSIS — F1010 Alcohol abuse, uncomplicated: Secondary | ICD-10-CM | POA: Diagnosis not present

## 2016-09-08 MED ORDER — MUPIROCIN CALCIUM 2 % EX CREA
TOPICAL_CREAM | Freq: Three times a day (TID) | CUTANEOUS | Status: DC
Start: 2016-09-08 — End: 2016-09-08
  Filled 2016-09-08: qty 15

## 2016-09-08 MED ORDER — MUPIROCIN 2 % EX OINT
TOPICAL_OINTMENT | CUTANEOUS | Status: AC
  Administered 2016-09-08: 1
  Filled 2016-09-08: qty 22

## 2016-09-08 NOTE — ED Provider Notes (Signed)
By signing my name below, I, Clarisse Gouge, attest that this documentation has been prepared under the direction and in the presence of Paula Libra, MD. Electronically signed, Clarisse Gouge, ED Scribe. 09/08/16. 12:25 AM.   [facility] Provider Note: Lowella Dell, MD, FACEP  CSN: [id number] MRN: [id number] ARRIVAL: 09/07/16 at 2248 ROOM: MH08/MH08   CHIEF COMPLAINT  Alcohol Intoxication   HISTORY OF PRESENT ILLNESS  HPI Comments: Angelica Elliott is a 41 y.o. female who presents to the Emergency Department complaining of a wound on her abdomen at what she believes is the site of her prior c-section (19 years ago). She noticed this 2 days ago. She has a history of bariatric surgery. She reports mild pain at the site of the wound and she expresses concern for infection. She admits to ETOH use this evening. Pt denies using any other drugs and suicidal/homicidal ideations. She is requesting referral to outpatient resources for alcohol abuse.   Past Medical History:  Diagnosis Date  . Back pain   . Obesity     Past Surgical History:  Procedure Laterality Date  . APPENDECTOMY    . CESAREAN SECTION    . CHOLECYSTECTOMY    . GASTRIC BYPASS    . TONSILLECTOMY    . TUBAL LIGATION      No family history on file.  Social History  Substance Use Topics  . Smoking status: Former Games developer  . Smokeless tobacco: Never Used  . Alcohol use Yes     Comment: 2 or 3 times a week    Prior to Admission medications   Medication Sig Start Date End Date Taking? Authorizing Provider  cholecalciferol (VITAMIN D) 1000 units tablet Take 50,000 Units by mouth every Friday.    Historical Provider, MD  ibuprofen (ADVIL,MOTRIN) 800 MG tablet Take 1 tablet (800 mg total) by mouth 3 (three) times daily. 08/26/16   April Palumbo, MD  Multiple Vitamins-Minerals (MULTIVITAMIN WITH MINERALS) tablet Take 1 tablet by mouth daily.    Historical Provider, MD    Allergies Amoxicillin and  Penicillins   REVIEW OF SYSTEMS  Negative except as noted here or in the History of Present Illness.   PHYSICAL EXAMINATION  Initial Vital Signs Blood pressure 161/90, pulse 111, temperature 98.1 F (36.7 C), temperature source Oral, resp. rate 24, height 5\' 4"  (1.626 m), weight (!) 425 lb (192.8 kg), SpO2 100 %.  Examination General: Well-developed, morbidly obese female in no acute distress; appearance consistent with age of record HENT: normocephalic; atraumatic Eyes: Normal appearance Neck: supple Heart: regular rate and rhythm Lungs: clear to auscultation bilaterally Abdomen: soft; obese; complicated abdominal scarring due to multiple surgeries; localized ulcerations in skin fold:     Extremities: No deformity; normal range of motion for body habitus Neurologic: Awake, alert; motor function intact in all extremities and symmetric; no facial droop Skin: Warm and dry; healing perioral rash Psychiatric: Flat affect   RESULTS  Summary of this visit's results, reviewed by myself:   EKG Interpretation  Date/Time:    Ventricular Rate:    PR Interval:    QRS Duration:   QT Interval:    QTC Calculation:   R Axis:     Text Interpretation:        Laboratory Studies: No results found for this or any previous visit (from the past 24 hour(s)). Imaging Studies: No results found.  ED COURSE  Nursing notes and initial vitals signs, including pulse oximetry, reviewed.  Vitals:   09/07/16  2253  BP: 161/90  Pulse: 111  Resp: 24  Temp: 98.1 F (36.7 C)  TempSrc: Oral  SpO2: 100%  Weight: (!) 425 lb (192.8 kg)  Height: 5\' 4"  (1.626 m)    PROCEDURES    ED DIAGNOSES     ICD-9-CM ICD-10-CM   1. Alcohol abuse 305.00 F10.10   2. Skin ulcer, limited to breakdown of skin (HCC) 707.9 L98.491      I personally performed the services described in this documentation, which was scribed in my presence. The recorded information has been reviewed and is accurate.     Paula LibraJohn Marget Outten, MD 09/08/16 [phone]

## 2016-09-08 NOTE — ED Notes (Signed)
Pt up to bathroom.

## 2016-09-08 NOTE — ED Notes (Signed)
Pt states ok to call family member to pick her up. Melissa Halfhill to pick pt up.

## 2016-10-13 ENCOUNTER — Ambulatory Visit: Payer: Self-pay

## 2016-10-13 NOTE — Telephone Encounter (Signed)
Regarding: [facility] pt- discharge from eye  ----- Message from [facility] sent at 10/13/2016  6:01 PM PDT -----   [facility] pt- discharge from eye.

## 2016-10-13 NOTE — Telephone Encounter (Signed)
Hit in the L eye with a base ball yesterday.  "Black eye peri orbital".  Tearing large amounts constantly.    Pain close to her nose and around her eye.  No scleral inflammation.  No visual change.   Applying ice packs to the site prn and IBP per OTC instructions.

## 2016-10-13 NOTE — Telephone Encounter (Signed)
Reason for Disposition  . [1] SEVERE pain AND [2] not improved 2 hours after pain medicine/ice packs    Protocols used: EYE INJURY-ADULT-AH  To ER near her home.  Will have transportation in 2 hours and agrees to go then.  Will call back if worsens, vision lost, HA, or further questions.

## 2016-10-31 ENCOUNTER — Inpatient Hospital Stay: Payer: Self-pay

## 2016-11-06 ENCOUNTER — Encounter (HOSPITAL_BASED_OUTPATIENT_CLINIC_OR_DEPARTMENT_OTHER): Payer: Self-pay | Admitting: Surgery

## 2016-11-07 ENCOUNTER — Ambulatory Visit (INDEPENDENT_AMBULATORY_CARE_PROVIDER_SITE_OTHER): Payer: Medicare Other | Admitting: Family Medicine

## 2016-11-17 ENCOUNTER — Encounter (INDEPENDENT_AMBULATORY_CARE_PROVIDER_SITE_OTHER): Payer: Medicare Other | Admitting: Family Medicine

## 2016-11-18 ENCOUNTER — Encounter (INDEPENDENT_AMBULATORY_CARE_PROVIDER_SITE_OTHER): Payer: Medicare Other | Admitting: Sports Medicine

## 2016-11-19 ENCOUNTER — Encounter (INDEPENDENT_AMBULATORY_CARE_PROVIDER_SITE_OTHER): Payer: Medicare Other | Admitting: Family Medicine

## 2016-12-03 ENCOUNTER — Encounter (INDEPENDENT_AMBULATORY_CARE_PROVIDER_SITE_OTHER): Payer: Self-pay

## 2016-12-03 ENCOUNTER — Ambulatory Visit (INDEPENDENT_AMBULATORY_CARE_PROVIDER_SITE_OTHER): Payer: Medicare Other | Admitting: Family

## 2016-12-03 VITALS — BP 135/81 | HR 90 | Temp 97.5°F | Resp 20 | Ht 65.0 in | Wt >= 6400 oz

## 2016-12-03 DIAGNOSIS — L0291 Cutaneous abscess, unspecified: Secondary | ICD-10-CM

## 2016-12-03 DIAGNOSIS — Z6841 Body Mass Index (BMI) 40.0 and over, adult: Secondary | ICD-10-CM

## 2016-12-03 MED ORDER — SULFAMETHOXAZOLE-TRIMETHOPRIM 800-160 MG OR TABS
1.0000 | ORAL_TABLET | Freq: Two times a day (BID) | ORAL | 0 refills | Status: AC
Start: 2016-12-03 — End: 2016-12-13

## 2016-12-03 NOTE — Progress Notes (Signed)
Chief Complaint   Patient presents with   . Burn     pt states that she got a [place] while in [place] ago on stomach and burn still isn't healed, so wants this checked.    . Cyst     c/o possible boil that has ruptured on the back of LT thigh x2 weeks, but wasn't noticed until x1 week ago.        SUBJECTIVE:  Sharon Stephens is an 41 year old female who presents with 2 blisters to right abd x1 mo ago, thinks it getting better. Left upper posterior leg/thigh boil x 2wks, sore 5/10, seems to be improved. Applied antibiotic cream, seems to help. Last tdap 4 mo ago.      Review of patient's allergies indicates:  Allergies   Allergen Reactions   . Amoxicillin      Other reaction(s): Undetermined  Unsure from childhood    . Penicillin G    . Penicillins      Other reaction(s): Undetermined  Unsure from childhood          Review of Systems   Constitutional: Negative for fever.   Skin: Positive for color change and wound.   All other systems reviewed and are negative.      I personally reviewed and confirmed the past medical history in the record with the patient today.      OBJECTIVE:  BP 135/81   Pulse 90   Temp 97.5 F (36.4 C) (Temporal)   Resp 20   Ht 5\' 5"  (1.651 m)   Wt (!) 420 lb (190.5 kg)   SpO2 97%   BMI 69.89 kg/m   Physical Exam   Constitutional: She is oriented to person, place, and time. She appears well-developed and well-nourished.   HENT:   Head: Normocephalic.   Eyes: EOM are normal. Pupils are equal, round, and reactive to light.   Neck: Normal range of motion. Neck supple.   Pulmonary/Chest: Effort normal.   Musculoskeletal: Normal range of motion.   Neurological: She is alert and oriented to person, place, and time.   Skin: Skin is warm and dry. There is erythema.   Left upper posterior inner thigh with small wound opening, no surrounding erythema or drainage. Right lower abd with (2) 1cm open lesions, dry and scabbed with some erythema, tender to touch   Nursing note and vitals  reviewed.      ASSESSMENT/PLAN  (L02.91) Abscess  (primary encounter diagnosis)  Plan: Sulfamethoxazole-Trimethoprim (BACTRIM DS)         800-160 MG Oral Tab    Wash wounds with soap and water  Open to air when possible  otc pain relief    If symptoms worsen please go the the Emergency Department.  Discussed medication in detail including dosing, side effects, and interactions.    Loreli DollarPatricia M Abigale Dorow, ARNP   [facility]

## 2016-12-03 NOTE — Patient Instructions (Addendum)
It was a pleasure to see you in clinic today. Your Medical Assistant was: Murray HodgkinsKameron                You can schedule an appointment to see Sharon Stephens by calling [phone] or via eCare.     If labs were ordered today the results are expected to be available via eCare 5 days later. Otherwise, result letters are mailed 7-10 days after your tests are completed. If your physician needs to change your care based on your results, you will receive a phone call to notify you. If you haven't heard from him/her and it has been more than 10 days please give Sharon Stephens a call.     Thank you for choosing  [facility].     Wash wounds with soap and water  Open to air when possible    Patient Education     Abscess (Antibiotic Treatment Only)  An abscess (sometimes called a "boil") happens when bacteria get trapped under the skin and start to grow. Pus forms inside the abscess as the body responds to the bacteria. An abscess can happen with an insect bite, ingrown hair, blocked oil gland, pimple, cyst, or puncture wound.  In the early stages, your wound may be red and tender. For this stage, you may get antibiotics. If the abscess does not get better with antibiotics, it will need to be drained with a small cut.  Home care  These tips will help you care for your abscess at home:   Soak the wound in hot water or apply hot packs (small towel soaked in hot water) to the area for 20 minutes at a time. Do this 3 to 4 times a day.   Do not cut, squeeze, or pop the boil yourself.   Apply antibiotic cream or ointment to the skin 3 to 4 times a day, unless something else was prescribed. Some ointments include an antibiotic plus a pain reliever.   If your doctor prescribed antibiotics, do not stop taking them until you have finished the medicine or the doctor tells you to stop.   You may use an over-the-counter pain medicine to control pain, unless another pain medicine was prescribed. If you have chronic liver or kidney disease or  ever had a stomach ulcer orgastrointestinal bleeding, talk with your doctor before using these any of these.  Follow-up care  Follow up with your healthcare provider, or as advised. Check your wound each day for the signs of worsening infection listed below.  When to seek medical advice  Get prompt medical attention if any of these occur:   An increase in redness or swelling   Red streaks in the skin leading away from the abscess   An increase in local pain or swelling   Fever of 100.69F (38C) or higher, or as directed by your healthcare provider   Pus or fluid coming from the abscess   Boil returns after getting better  Date Last Reviewed: 03/08/2015   2000-2017 [employer] [address], [place], [place] [id number]. All rights reserved. This information is not intended as a substitute for professional medical care. Always follow your healthcare professional's instructions.

## 2016-12-03 NOTE — Progress Notes (Signed)
Vitals, medication(s), and allergies verified by Siham Bucaro, MA-C

## 2016-12-11 ENCOUNTER — Encounter (HOSPITAL_BASED_OUTPATIENT_CLINIC_OR_DEPARTMENT_OTHER): Payer: Self-pay | Admitting: Surgery

## 2016-12-30 ENCOUNTER — Inpatient Hospital Stay: Payer: Self-pay

## 2016-12-31 NOTE — Progress Notes (Deleted)
Visit: ER f/u    Refills? NO  Referral? NO  Letter or Form? NO  Lab Results? NO    HEALTH MAINTENANCE:  Has the patient has this done since their last visit?  Cervical screening/PAP: Due  Mammo: N/A  Colon Screen: N/A  Diabetic Eye Exam (If applicable): N/A    Have you seen a specialist since your last visit: No    Vaccines Due? No    Does patient have eCare?  Yes    HM Due:   Health Maintenance   Topic Date Due   . Depression Monitoring (PHQ-9)  07/03/1990   . HIV Screening  07/03/1993   . Cervical Cancer Screening (Pap Smear)  11/11/2013   . Influenza Vaccine (Season Ended) 04/06/2017   . Tetanus Vaccine  05/05/2019       PCP Verified?  Gaspar SkeetersYes, Neysa Koury PAC

## 2017-01-01 ENCOUNTER — Ambulatory Visit (INDEPENDENT_AMBULATORY_CARE_PROVIDER_SITE_OTHER): Payer: Medicare Other | Admitting: Medical

## 2017-01-01 ENCOUNTER — Ambulatory Visit (INDEPENDENT_AMBULATORY_CARE_PROVIDER_SITE_OTHER): Payer: Self-pay

## 2017-01-01 NOTE — Telephone Encounter (Signed)
"  I fell on Monday" "I tripped on a carpet coming out of the store and landed on my left upper arm and chest" "I am black and blue on the inside of my arm and left breast" "I went to the ER and they did and upper x-ray and there weren't any rib fractures" "I have been using ice and Tylenol and the pain is severe at a 8/10" "It's very painful to take a deep breath" "the pain is in my lower rib cage"      Reason for Disposition  . SEVERE chest pain     "Pain is an 8/10" "it's so painful I can barely move"    Additional Information  . Negative: Major injury from dangerous force or speed (e.g., MVA, fall > 10 feet or 3 meters)  . Negative: Bullet wound, knife wound or other penetrating object  . Negative: Puncture wound that sounds life-threatening to the triager  . Negative: Severe difficulty breathing (e.g., struggling for each breath, speaks in single words)  . Negative: Major bleeding (actively dripping or spurting) that can't be stopped  . Negative: Open wound of the chest with sound of moving air (sucking wound) or visible air bubbles  . Negative: Shock suspected (e.g., cold/pale/clammy skin, too weak to stand)  . Negative: Coughing or spitting up blood  . Negative: Bluish lips, tongue, or face now  . Negative: Unconscious or was unconscious  . Negative: Sounds like a life-threatening emergency to the triager  . Negative: Chest pain not from an injury  . Negative: Wound looks infected    Protocols used: CHEST INJURY-ADULT-OH    Please create telephone encounter (TE) with follow-up instructions if a follow-up call is needed and send to Clinical Staff Pool for [facility] staff to call patient.

## 2017-01-01 NOTE — Telephone Encounter (Signed)
Patient needs to be triaged. Monday she had a fall, she can not move because of pain.

## 2017-01-01 NOTE — Telephone Encounter (Signed)
(  TEXTING IS AN OPTION FOR [facility] CLINICS ONLY)  Is this a [facility] clinic? Yes. Patient declined the option to receive mobile text messages.      RETURN CALL: Detailed message on voicemail only      SUBJECT:  Option 8 ([facility] ONLY)      SUMMARY OF PATIENT'S CONCERN: Patient requesting to speak to care team regarding her symptoms. From her injury, she is unable to move. Thank you.

## 2017-01-01 NOTE — Telephone Encounter (Signed)
Covering provider note: has appointment scheduled next week; does she desire earlier appointment?  Thanks.

## 2017-01-02 ENCOUNTER — Telehealth (INDEPENDENT_AMBULATORY_CARE_PROVIDER_SITE_OTHER): Payer: Self-pay | Admitting: Family Medicine

## 2017-01-02 NOTE — Telephone Encounter (Signed)
If patient CB please ask if she wants earlier than 7/3?    LVM for her to CB and let us know, if no response then she is good with that date.

## 2017-01-02 NOTE — Telephone Encounter (Signed)
Called patient per TE Triage of 06/28.     Pt scheduled on 07/03 per Dr. Rhona Leavenshiu, is patient wanting a earlier appt? Please advise

## 2017-01-06 ENCOUNTER — Ambulatory Visit (INDEPENDENT_AMBULATORY_CARE_PROVIDER_SITE_OTHER): Payer: Medicare Other | Admitting: Adult Health

## 2017-01-06 VITALS — BP 149/86 | HR 80 | Temp 97.2°F | Ht 65.0 in | Wt >= 6400 oz

## 2017-01-06 DIAGNOSIS — Z6841 Body Mass Index (BMI) 40.0 and over, adult: Secondary | ICD-10-CM

## 2017-01-06 DIAGNOSIS — R05 Cough: Secondary | ICD-10-CM

## 2017-01-06 DIAGNOSIS — R059 Cough, unspecified: Secondary | ICD-10-CM

## 2017-01-06 DIAGNOSIS — J302 Other seasonal allergic rhinitis: Secondary | ICD-10-CM

## 2017-01-06 DIAGNOSIS — S20212D Contusion of left front wall of thorax, subsequent encounter: Secondary | ICD-10-CM

## 2017-01-06 MED ORDER — FLUTICASONE PROPIONATE 50 MCG/ACT NA SUSP
2.0000 | Freq: Every day | NASAL | 0 refills | Status: DC
Start: 2017-01-06 — End: 2017-03-04

## 2017-01-06 MED ORDER — ALBUTEROL SULFATE HFA 108 (90 BASE) MCG/ACT IN AERS
2.0000 | INHALATION_SPRAY | Freq: Four times a day (QID) | RESPIRATORY_TRACT | 0 refills | Status: DC | PRN
Start: 2017-01-06 — End: 2017-04-23

## 2017-01-06 MED ORDER — DICLOFENAC SODIUM 1 % TD GEL
4.0000 g | Freq: Four times a day (QID) | TRANSDERMAL | 1 refills | Status: DC | PRN
Start: 2017-01-06 — End: 2017-03-04

## 2017-01-06 MED ORDER — METHOCARBAMOL 500 MG OR TABS
1000.0000 mg | ORAL_TABLET | Freq: Four times a day (QID) | ORAL | 0 refills | Status: DC | PRN
Start: 2017-01-06 — End: 2017-01-14

## 2017-01-06 NOTE — Progress Notes (Signed)
Chief Complaint   Patient presents with   . ER FOLLOW UP   . Other     Rib pain        Have you seen a specialist since your last visit: NO              Health Maintenance   Topic Date Due   . Depression Monitoring (PHQ-9)  07/03/1990   . HIV Screening  07/03/1993   . Cervical Cancer Screening (Pap Smear)  11/11/2013   . Influenza Vaccine (1) 04/06/2017   . Tetanus Vaccine  05/05/2019       No future appointments.

## 2017-01-06 NOTE — Progress Notes (Signed)
Sharon Stephens is a 41 year old female here today regarding the following concern(s).    1. left sided pain - ED follow up    2. cough      The following portions of the patient's history were reviewed with the patient and updated as appropriate: problem list, current medications, allergies, past medical history, past surgical history and past social history.      History of Present Illness (HPI)    left side pain - ED follow up    Date seen:  12/30/2016  Hospital:  [facility] ED  Reason for going:  left chest pain s/p fall  Admitted?:  NO   Diagnostics:  CXR showed prior healed fractures, but no new displaced rib fractures.  Visit Dx:  Fall from ground level; chest wall contusion, left.  Interventions:  Rx:  Acetaminophen, hydrocodone-acetaminophen, ibuprofen, ondansetron, incentive spirometer    Patient reports she tripped on a rug and fell into shelving.      Patient notes she did not take ibuprofen due to bariatric status.      Cough    Notes [facility] ED told her that if she started coughing she should be seen.    Onset 2 days ago  Cough is productive  Endorses rhinorrhea, sneezing  No fevers/chills    Patient reports exposure to cats, not sure if she's allergic.      Review of Systems   Constitutional: Negative.    HENT: Positive for congestion, rhinorrhea and sneezing.    Eyes: Negative.    Respiratory: Positive for cough.    Cardiovascular: Positive for chest pain (chest wall).       Vitals:    01/06/17 1150 01/06/17 1152   BP: 168/84 149/86   BP Cuff Size: Regular Regular   BP Site: Right Arm Right Arm   BP Position: Sitting Sitting   Pulse: 80    Temp: 97.2 F (36.2 C)    TempSrc: Temporal    SpO2: 97%    Weight: (!) 414 lb (187.8 kg)    Height: 5\' 5"  (1.651 m)        Physical Exam   Constitutional: She is well-developed, well-nourished, and in no distress.   HENT:   Head: Normocephalic.   Eyes: Conjunctivae are normal. Right eye exhibits no discharge. Left eye exhibits no discharge. No scleral icterus.      Cardiovascular: Normal rate, regular rhythm and normal heart sounds.    Pulmonary/Chest: Effort normal and breath sounds normal. No respiratory distress. She has no wheezes.   Somewhat diminished due to habitus   Neurological: She is alert.   Skin: Skin is warm and dry. Bruising noted. She is not diaphoretic.        left arm -   Linear fading contusions   Psychiatric: Affect normal.   Vitals reviewed.        Assessment   1. Rib contusion, left, subsequent encounter  Systemic NSAIDs contraindicated but she would benefit from topical.  Reviewed expected course, supportive care with patient.  - Diclofenac Sodium 1 % Transdermal Gel; Apply 4 g topically 4 times a day as needed (pain).  Dispense: 100 g; Refill: 1  - Methocarbamol 500 MG Oral Tab; Take 2 tablets (1,000 mg) by mouth 4 times a day as needed for muscle spasms.  Dispense: 60 tablet; Refill: 0    2. Chronic seasonal allergic rhinitis, unspecified trigger  Symptoms most consistent with allergic reaction.  - Fluticasone Propionate 50 MCG/ACT Nasal Suspension; Spray  2 sprays into each nostril daily.  Dispense: 15.8 mL; Refill: 0    3. Cough  Do not suspect pneumonia at this time.  Recommend allergy control, continued use of incentive spirometer.  - Albuterol Sulfate HFA 108 (90 Base) MCG/ACT Inhalation Aero Soln; Inhale 2 puffs by mouth every 6 hours as needed for shortness of breath/wheezing. For wheezing and/or cough.  Dispense: 1 Inhaler; Refill: 0      Patient Instructions   Continue with [facility], applying ice and heat.  Ice for 20 minutes, followed by heat for 20 minutes, at least 3 times a day or after activity that irritates it.    You can also try capsaicin cream or a lidocaine patch available over the counter.    Try a smaller dose of the methocarbamol muscle relaxer to see if it gives you some relief during the day without making you too sleepy.    Gentle stretching and deep breathing to help improve circulation through to heal  faster.        Diagnoses from this visit:  (S20.212D) Rib contusion, left, subsequent encounter  (primary encounter diagnosis)  (J30.2) Chronic seasonal allergic rhinitis, unspecified trigger  (R05) Cough

## 2017-01-06 NOTE — Patient Instructions (Addendum)
Continue with [facility], applying ice and heat.  Ice for 20 minutes, followed by heat for 20 minutes, at least 3 times a day or after activity that irritates it.    You can also try capsaicin cream or a lidocaine patch available over the counter.    Try a smaller dose of the methocarbamol muscle relaxer to see if it gives you some relief during the day without making you too sleepy.    Gentle stretching and deep breathing to help improve circulation through to heal faster.

## 2017-01-09 ENCOUNTER — Inpatient Hospital Stay: Payer: Self-pay

## 2017-01-10 ENCOUNTER — Ambulatory Visit: Payer: Self-pay

## 2017-01-10 NOTE — Telephone Encounter (Signed)
Reason for Disposition  . [1] SEVERE pain AND [2] not improved 2 hours after pain medicine/ice packs    Protocols used: SHOULDER INJURY-ADULT-AH  Pt calling for self.  I fell and got hurt a week ago.  Was seen in [facility] ED.  States they xrayed her ribs, but she does not think they xrayed her shoulder.  Since then she has been having such shoulder pain she is unable to sleep.  Pain is 10/10 tonigiht.  Can move shoulder with pain.  Entire hand is numb.  States it has been numb on and off since injury.  Was bruised in armpit, but bruise has resolved.  No fever, not hot or red on shoulder area.  Is currently in [place] states she will go [facility] ED.  Advised to have someone else drive.

## 2017-01-10 NOTE — Telephone Encounter (Signed)
Regarding: [place] pt unable to sleep due to pain in shoulder  ----- Message from Joanna Hewsharlene S Brown sent at 01/10/2017  4:08 AM PDT -----  [facility] unable to sleep due to pain in shoulder

## 2017-01-12 ENCOUNTER — Telehealth (INDEPENDENT_AMBULATORY_CARE_PROVIDER_SITE_OTHER): Payer: Self-pay

## 2017-01-12 NOTE — Telephone Encounter (Signed)
Please see Nurse Triage/ Careline report    Reason: shoulder pain   Recommended Disposition: See Physician Within 4 Hours (or PCP Triage)     Action: apt sch 01/14/17

## 2017-01-14 ENCOUNTER — Ambulatory Visit (INDEPENDENT_AMBULATORY_CARE_PROVIDER_SITE_OTHER): Payer: Medicare Other | Admitting: Internal Medicine

## 2017-01-14 ENCOUNTER — Encounter (INDEPENDENT_AMBULATORY_CARE_PROVIDER_SITE_OTHER): Payer: Self-pay | Admitting: Internal Medicine

## 2017-01-14 VITALS — BP 151/95 | HR 78 | Temp 97.5°F | Resp 16 | Wt >= 6400 oz

## 2017-01-14 DIAGNOSIS — R030 Elevated blood-pressure reading, without diagnosis of hypertension: Secondary | ICD-10-CM

## 2017-01-14 DIAGNOSIS — Z6841 Body Mass Index (BMI) 40.0 and over, adult: Secondary | ICD-10-CM

## 2017-01-14 DIAGNOSIS — M25512 Pain in left shoulder: Secondary | ICD-10-CM

## 2017-01-14 MED ORDER — METAXALONE 800 MG OR TABS
800.0000 mg | ORAL_TABLET | Freq: Three times a day (TID) | ORAL | 1 refills | Status: DC
Start: 2017-01-14 — End: 2017-02-02

## 2017-01-14 MED ORDER — PREGABALIN 100 MG OR CAPS
100.0000 mg | ORAL_CAPSULE | Freq: Two times a day (BID) | ORAL | 0 refills | Status: DC
Start: 2017-01-14 — End: 2017-03-04

## 2017-01-14 NOTE — Progress Notes (Signed)
Chief Complaint   Patient presents with   . Follow-Up    . Fall   . Shoulder Injury   . Numbness     HPI  Sharon Stephens presents with left shoulder pain since a fall on 12/29/16, is 8/10, getting only mild relief from the methocarbamol but it makes her sleepy.     Past Medical History:   Diagnosis Date   . Allergic rhinitis, cause unspecified    . Chronic obstructive asthma, unspecified (HCC)    . Depressive disorder, not elsewhere classified    . Esophageal reflux    . Essential hypertension, benign    . Generalized osteoarthrosis, unspecified site    . Hernia of other specified sites of abdominal cavity without mention of obstruction or gangrene     notes having 3 in abdomen    . Morbid obesity (HCC)    . Type II or unspecified type diabetes mellitus without mention of complication, not stated as uncontrolled (HCC)     broaderline    . Unspecified sleep apnea      Past Surgical History:   Procedure Laterality Date   . APPENDECTOMY     . bariatric surgery  2011   . BARIATRIC SURGERY     . c-sections     . CESAREAN DELIVERY ONLY     . CHOLECYSTECTOMY     . gallbladder removed     . LIG/TRNSXJ FLP TUBE ABDL/VAG APPR UNI/BI     . TONSILLECTOMY ONE-HALF <AGE 54     . tube tied         Current Outpatient Prescriptions:   .  Albuterol Sulfate HFA 108 (90 Base) MCG/ACT Inhalation Aero Soln, Inhale 2 puffs by mouth every 6 hours as needed for shortness of breath/wheezing. For wheezing and/or cough., Disp: 1 Inhaler, Rfl: 0  .  Cholecalciferol (VITAMIN D) 1000 UNITS Oral Tab, TAKE 1 TABLET DAILY, Disp: , Rfl:   .  Diclofenac Sodium 1 % Transdermal Gel, Apply 4 g topically 4 times a day as needed (pain)., Disp: 100 g, Rfl: 1  .  Fluticasone Propionate 50 MCG/ACT Nasal Suspension, Spray 2 sprays into each nostril daily., Disp: 15.8 mL, Rfl: 0  .  Levonorgestrel 20 MCG/24HR Intrauterine IUD, 1 Intra Uterine Device by Intrauterine route One time., Disp: , Rfl:   .  Methocarbamol 500 MG Oral Tab, Take 2 tablets (1,000 mg) by  mouth 4 times a day as needed for muscle spasms., Disp: 60 tablet, Rfl: 0  .  Multiple Vitamin (MULTI VITAMIN MENS OR), Once daily, Disp: , Rfl:     Social History   Substance Use Topics   . Smoking status: Never Smoker   . Smokeless tobacco: Never Used   . Alcohol use No     ROS  Const:weight fairly stable   Neuro: has some numbness in the left arm and hand too, no weakness    EXAM  BP (!) 151/95 Comment: REPEAT   Pulse 78   Temp 97.5 F (36.4 C) (Temporal)   Resp 16   Wt (!) 420 lb (190.5 kg)   SpO2 98%   BMI 69.89 kg/m    ZOX:WRUEA, no acute distress  Left shoulder: no deformity, restricted range of motion in multiple planes, strength seems normal, some trapezius tenderness     A/P    (M25.512) Left shoulder pain, unspecified chronicity  (primary encounter diagnosis)  Plan: REFERRAL TO PHYSICAL THERAPY, Metaxalone         (METAXALL)  800 MG Oral Tab, Pregabalin 100 MG         Oral Cap            (R03.0) Elevated blood-pressure reading without diagnosis of hypertension  Plan: see instructions

## 2017-01-14 NOTE — Progress Notes (Signed)
Reason for visit:  Pt. Here for left shoulder pain and fingers numbness from a fall on 12/29/16       Pt. Declined PHQ-9 questionairs.       Have you seen a specialist since your last visit: NO          01/14/2017  WAIIS / [facility] has been reviewed/entered in Epic: {y/n:YES  HM DUE : YES (PHQ-9, HIV screening, pap )  Future recommended date vaccine(s):NO   PHQ:12/11/15  Santa Generahuch Bri Wakeman, CMA    Health Maintenance   Topic Date Due   . Depression Monitoring (PHQ-9)  07/03/1990   . HIV Screening  07/03/1993   . Cervical Cancer Screening (Pap Smear)  11/11/2013   . Influenza Vaccine (1) 04/06/2017   . Tetanus Vaccine  05/05/2019       No future appointments.

## 2017-01-14 NOTE — Patient Instructions (Addendum)
Start physical therapy.     You can try the metaxalone (a muscle relaxer which is not supposed to make you sleepy) and the pregabalin (similar to gabapentin but hopefully will not upset your stomach).    Please schedule a wellness exam with your doctor to catch up on cervical cancer screening (pap smear).    Please work on diet and exercise to lower your blood pressure and follow up with your doctor in three months.  Target is < 130/80.

## 2017-01-15 ENCOUNTER — Other Ambulatory Visit (INDEPENDENT_AMBULATORY_CARE_PROVIDER_SITE_OTHER): Payer: Self-pay | Admitting: Internal Medicine

## 2017-01-15 NOTE — Telephone Encounter (Signed)
She has the methocarbamol, so another substitution is not necessary.  Please let her know her insurance won't pay for the nonsedating one (metaxalone).  Thanks.

## 2017-01-15 NOTE — Telephone Encounter (Signed)
Medication not covered, covered alternatives are:  Cyclobenzaprine  Orphenadrine ER  Methocarbamol    These medications are out of protocol for [facility]

## 2017-01-15 NOTE — Telephone Encounter (Signed)
Patient informed. 

## 2017-01-16 ENCOUNTER — Telehealth (HOSPITAL_BASED_OUTPATIENT_CLINIC_OR_DEPARTMENT_OTHER): Payer: Self-pay

## 2017-01-16 NOTE — Telephone Encounter (Signed)
(  TEXTING IS AN OPTION FOR [facility] CLINICS ONLY)  Is this a [facility] clinic? No      RETURN CALL: Detailed message on voicemail only      SUBJECT:  General Message     REASON FOR REQUEST: referral    MESSAGE: Pt would like to establish care at your location.

## 2017-01-27 ENCOUNTER — Ambulatory Visit (INDEPENDENT_AMBULATORY_CARE_PROVIDER_SITE_OTHER): Payer: Self-pay | Admitting: Family Medicine

## 2017-01-27 ENCOUNTER — Telehealth (INDEPENDENT_AMBULATORY_CARE_PROVIDER_SITE_OTHER): Payer: Self-pay

## 2017-01-27 DIAGNOSIS — Z111 Encounter for screening for respiratory tuberculosis: Secondary | ICD-10-CM

## 2017-01-27 NOTE — Telephone Encounter (Signed)
No orders in chart. Routing to provider to order.

## 2017-01-27 NOTE — Telephone Encounter (Signed)
(  TEXTING IS AN OPTION FOR [facility] CLINICS ONLY)  Is this a [facility] clinic? No      RETURN CALL: Detailed message on voicemail only      SUBJECT:  Appointment Request     REASON FOR REQUEST/SYMPTOMS: TB Reading  REFERRING PROVIDER: NA  REQUEST APPOINTMENT WITH: Lab  REQUESTED DATE: 01/31/17, TIME: Open  UNABLE TO APPOINT BECAUSE: There are no available openings on 01/31/17 for lab

## 2017-01-28 ENCOUNTER — Telehealth (INDEPENDENT_AMBULATORY_CARE_PROVIDER_SITE_OTHER): Payer: Self-pay | Admitting: Internal Medicine

## 2017-01-28 ENCOUNTER — Ambulatory Visit (INDEPENDENT_AMBULATORY_CARE_PROVIDER_SITE_OTHER): Payer: Medicare Other

## 2017-01-28 DIAGNOSIS — Z111 Encounter for screening for respiratory tuberculosis: Secondary | ICD-10-CM

## 2017-01-28 DIAGNOSIS — M25512 Pain in left shoulder: Secondary | ICD-10-CM

## 2017-01-28 DIAGNOSIS — S20212D Contusion of left front wall of thorax, subsequent encounter: Secondary | ICD-10-CM

## 2017-01-28 NOTE — Telephone Encounter (Signed)
Pt was at clinic staff apt and asked for a refill of medication for pregabalin due to Left shoulder pain    Last OV 01/14/2017  Last written 01/14/2017  Rx and pharmacy pended.   Routing to provider to complete or relay instructions for next step

## 2017-01-28 NOTE — Telephone Encounter (Signed)
So the instructions on that say one capsule by mouth twice daily.    It should not need to be refilled until August 10.  Please let her know. Thanks!

## 2017-01-28 NOTE — Telephone Encounter (Signed)
Does she need the TB test read or does she need it placed?  Either way, a clinic staff appointment is the type to make and there is plenty of availability for those.  Please help her schedule.

## 2017-01-28 NOTE — Telephone Encounter (Signed)
Patient has appt at [place] for clinical staff today.    Will close TE.

## 2017-01-28 NOTE — Progress Notes (Signed)
Tuberculin Skin Test Placement Documentation:    Reason for TB (tuberculosis) Skin Test (TST) request?  Housing     Have you had a positive reaction to a TB (tuberculosis) Skin test (PPD) in the past (Swelling/Induration)?  No.  If Yes, did you have a chest x-ray?  No    Have you had a weakened immune system because of HIV/AIDS, or another disease that affects the immune system, need long term treatment with drugs such as high-dose steroids, or cancer treatments with radiation or drugs?  No    Are you taking steroid medications (Prednisone/Dexamethasone/Cortisone/Budesonide)? No    Have you received any live vaccine in the past 4 weeks (Measles/Mumps/Rubella (MMR), Varicella, Zoster, Yellow Fever, Flumist)? No    Do you need a 2 step PPD? No.  If Yes, is the patient receiving a live vaccine today (Measles/Mumps/Rubella (MMR), Varicella, Zoster, Yellow Fever, Flumist)?  No.  If Yes, wait until 2nd step of PPD to administer the live vaccine. Proceed with 2 Step PPD 1-3 weeks after 1st PPD was read.      (If YES or UNSURE is answered to any of the above questions, consult with provider or RN  before placing skin test).    Verbal informed consent obtained for TST placement:YES      Patient agrees to return to clinic in 48-72 hours so that staff can read the result of the TST: YES      Tuberculin skin test applied to Left verntral forearm at 1:46pm  (approximate time).     Patient given written appointment card for return visit to read TST within 48-72 hrs (2-3 days): YES    Patient given written or AVS information about Tuberculin Skin Test: YES

## 2017-01-29 ENCOUNTER — Ambulatory Visit (INDEPENDENT_AMBULATORY_CARE_PROVIDER_SITE_OTHER): Payer: Medicare Other

## 2017-01-29 NOTE — Telephone Encounter (Signed)
Patient stated she did not request this, she requested a refill of Methocarbamol 500mg  BID, and she said she takes 4-5 pills a day. Does she need apt?

## 2017-01-30 ENCOUNTER — Encounter (INDEPENDENT_AMBULATORY_CARE_PROVIDER_SITE_OTHER): Payer: Self-pay | Admitting: Family

## 2017-01-30 ENCOUNTER — Ambulatory Visit (INDEPENDENT_AMBULATORY_CARE_PROVIDER_SITE_OTHER): Payer: Medicare Other

## 2017-02-02 MED ORDER — METHOCARBAMOL 500 MG OR TABS
1000.0000 mg | ORAL_TABLET | Freq: Four times a day (QID) | ORAL | 0 refills | Status: DC | PRN
Start: 2017-02-02 — End: 2017-03-04

## 2017-02-02 NOTE — Telephone Encounter (Signed)
Ok I sent in methocarbamol, please let her know

## 2017-02-02 NOTE — Telephone Encounter (Signed)
Called patient and gave message to her

## 2017-02-12 ENCOUNTER — Inpatient Hospital Stay: Payer: Self-pay

## 2017-03-04 ENCOUNTER — Encounter (INDEPENDENT_AMBULATORY_CARE_PROVIDER_SITE_OTHER): Payer: Self-pay | Admitting: Family Medicine

## 2017-03-04 ENCOUNTER — Ambulatory Visit (INDEPENDENT_AMBULATORY_CARE_PROVIDER_SITE_OTHER): Payer: Medicare Other | Admitting: Family Medicine

## 2017-03-04 VITALS — BP 129/90 | HR 90 | Temp 97.4°F | Resp 18 | Wt >= 6400 oz

## 2017-03-04 DIAGNOSIS — Z6841 Body Mass Index (BMI) 40.0 and over, adult: Secondary | ICD-10-CM

## 2017-03-04 DIAGNOSIS — M25512 Pain in left shoulder: Secondary | ICD-10-CM

## 2017-03-04 DIAGNOSIS — F325 Major depressive disorder, single episode, in full remission: Secondary | ICD-10-CM

## 2017-03-04 DIAGNOSIS — F5101 Primary insomnia: Secondary | ICD-10-CM

## 2017-03-04 MED ORDER — DULOXETINE HCL 30 MG OR CPEP
30.0000 mg | DELAYED_RELEASE_CAPSULE | Freq: Every day | ORAL | 11 refills | Status: DC
Start: 2017-03-04 — End: 2017-04-23

## 2017-03-04 MED ORDER — TRAZODONE HCL 50 MG OR TABS
50.0000 mg | ORAL_TABLET | Freq: Every evening | ORAL | 3 refills | Status: DC | PRN
Start: 2017-03-04 — End: 2017-10-20

## 2017-03-04 MED ORDER — DULOXETINE HCL 60 MG OR CPEP
60.0000 mg | DELAYED_RELEASE_CAPSULE | Freq: Every day | ORAL | 11 refills | Status: DC
Start: 2017-03-04 — End: 2017-04-23

## 2017-03-04 MED ORDER — IBUPROFEN 800 MG OR TABS
800.0000 mg | ORAL_TABLET | Freq: Three times a day (TID) | ORAL | 3 refills | Status: DC | PRN
Start: 2017-03-04 — End: 2017-08-21

## 2017-03-04 MED ORDER — CYCLOBENZAPRINE HCL 10 MG OR TABS
10.0000 mg | ORAL_TABLET | Freq: Three times a day (TID) | ORAL | 0 refills | Status: DC | PRN
Start: 2017-03-04 — End: 2017-08-21
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
## 2017-03-04 NOTE — Patient Instructions (Signed)
Go to the front desk and get established with one of the resident doctors

## 2017-03-04 NOTE — Addendum Note (Signed)
Addended by: Floyce StakesJACKSON, Joevanni Roddey ANN on: 03/04/2017 08:25 PM     Modules accepted: Level of Service

## 2017-03-04 NOTE — Progress Notes (Signed)
Reason for Visit: See CC    Refills? NO  Referral? NO  Letter or Form? NO  Lab Results? NO    HEALTH MAINTENANCE:  Has the patient had this done since their last visit?  Cervical screening/PAP: Due  Mammo: N/A  Colon Screen: N/A    Have you seen a specialist since your last visit: No    Vaccines Due? No    HM Due:   Health Maintenance   Topic Date Due   . Depression Monitoring (PHQ-9)  07/03/1990   . HIV Screening  07/03/1993   . Cervical Cancer Screening (Pap Smear)  11/11/2013   . Influenza Vaccine (1) 04/06/2017   . Tetanus Vaccine  05/05/2019       PCP Verified?  Yes, Unknown    BP Recheck completed: Single BP check    Comments/concerns: Patient to schedule appointment with PCP they choose to establish with.     BP Readings from Last 5 Encounters:   03/04/17 129/90   01/14/17 (!) 151/95   01/06/17 149/86   12/03/16 135/81   05/28/16 134/84

## 2017-03-12 ENCOUNTER — Emergency Department
Admission: EM | Admit: 2017-03-12 | Discharge: 2017-03-13 | Disposition: A | Discharge status: Home / Self Care | Payer: Medicare Other | Attending: Emergency Medicine | Admitting: Emergency Medicine

## 2017-03-12 ENCOUNTER — Other Ambulatory Visit: Payer: Self-pay | Admitting: Student in an Organized Health Care Education/Training Program

## 2017-03-12 DIAGNOSIS — I10 Essential (primary) hypertension: Secondary | ICD-10-CM | POA: Insufficient documentation

## 2017-03-12 DIAGNOSIS — G8911 Acute pain due to trauma: Secondary | ICD-10-CM | POA: Insufficient documentation

## 2017-03-12 DIAGNOSIS — K219 Gastro-esophageal reflux disease without esophagitis: Secondary | ICD-10-CM | POA: Insufficient documentation

## 2017-03-12 DIAGNOSIS — M549 Dorsalgia, unspecified: Secondary | ICD-10-CM

## 2017-03-12 DIAGNOSIS — M25512 Pain in left shoulder: Secondary | ICD-10-CM

## 2017-03-12 DIAGNOSIS — M542 Cervicalgia: Secondary | ICD-10-CM | POA: Insufficient documentation

## 2017-03-12 DIAGNOSIS — S34109A Unspecified injury to unspecified level of lumbar spinal cord, initial encounter: Secondary | ICD-10-CM | POA: Insufficient documentation

## 2017-03-12 DIAGNOSIS — M438X4 Other specified deforming dorsopathies, thoracic region: Secondary | ICD-10-CM

## 2017-03-17 ENCOUNTER — Inpatient Hospital Stay: Payer: Self-pay

## 2017-03-19 ENCOUNTER — Other Ambulatory Visit (INDEPENDENT_AMBULATORY_CARE_PROVIDER_SITE_OTHER): Payer: Self-pay | Admitting: Internal Medicine

## 2017-03-19 DIAGNOSIS — S20212D Contusion of left front wall of thorax, subsequent encounter: Secondary | ICD-10-CM

## 2017-03-20 ENCOUNTER — Encounter (INDEPENDENT_AMBULATORY_CARE_PROVIDER_SITE_OTHER): Payer: Medicare Other | Admitting: Adult Health

## 2017-03-20 ENCOUNTER — Emergency Department
Admission: EM | Admit: 2017-03-20 | Discharge: 2017-03-20 | Disposition: A | Discharge status: Home / Self Care | Payer: Medicare Other | Attending: Emergency Medicine | Admitting: Emergency Medicine

## 2017-03-20 DIAGNOSIS — E119 Type 2 diabetes mellitus without complications: Secondary | ICD-10-CM | POA: Insufficient documentation

## 2017-03-20 DIAGNOSIS — Y909 Presence of alcohol in blood, level not specified: Secondary | ICD-10-CM | POA: Insufficient documentation

## 2017-03-20 DIAGNOSIS — H16421 Pannus (corneal), right eye: Secondary | ICD-10-CM | POA: Insufficient documentation

## 2017-03-20 DIAGNOSIS — I10 Essential (primary) hypertension: Secondary | ICD-10-CM | POA: Insufficient documentation

## 2017-03-20 DIAGNOSIS — F10129 Alcohol abuse with intoxication, unspecified: Secondary | ICD-10-CM | POA: Insufficient documentation

## 2017-03-20 DIAGNOSIS — Z880 Allergy status to penicillin: Secondary | ICD-10-CM | POA: Insufficient documentation

## 2017-03-20 LAB — STANDARD DRUG SCREEN, URN
Acetaminophen Qualitative, URN: NEGATIVE
Alcohol (Ethyl), URN: 232 mg/dL — AB
Amphet/Methamphetamine Qual,URN: NEGATIVE
Barbiturate (Qual), URN: NEGATIVE
Benzodiazepines (Qual), URN: NEGATIVE
Cannabinoids (Qual), URN: NEGATIVE
Cocaine (Qual), URN: NEGATIVE
Methadone (Qual), URN: NEGATIVE
Opiates (Qual), URN: NEGATIVE
Phencyclidine (Qual), URN: NEGATIVE
Tricyclic Antidepressants, URN: NEGATIVE

## 2017-03-20 LAB — URINALYSIS WITH REFLEX CULTURE
Bilirubin (Qual), URN: NEGATIVE
Epith Cells_Renal/Trans,URN: NEGATIVE /HPF
Epith Cells_Squamous, URN: NEGATIVE /LPF
Glucose Qual, URN: NEGATIVE mg/dL
Ketones, URN: NEGATIVE mg/dL
Leukocyte Esterase, URN: NEGATIVE
Nitrite, URN: NEGATIVE
Occult Blood, URN: NEGATIVE
Protein (Alb Semiquant), URN: NEGATIVE mg/dL
RBC, URN: NEGATIVE /HPF
Specific Gravity, URN: <1.006 g/mL — ABNORMAL LOW (ref 1.006–1.029)
WBC, URN: NEGATIVE /HPF
pH, URN: 6 (ref 5.0–8.0)

## 2017-03-20 MED ORDER — METHOCARBAMOL 500 MG OR TABS
ORAL_TABLET | ORAL | 0 refills | Status: DC
Start: 2017-03-20 — End: 2017-09-29

## 2017-03-20 NOTE — Telephone Encounter (Signed)
This medication is outside of the Refill Center's protocols. Please sign and close the encounter if you approve:    Methocarbamol 500MG tab    If this medication is denied please have your staff inform the patient and schedule an appointment if necessary.

## 2017-03-21 ENCOUNTER — Inpatient Hospital Stay: Payer: Self-pay

## 2017-03-22 ENCOUNTER — Inpatient Hospital Stay: Payer: Self-pay

## 2017-03-23 ENCOUNTER — Inpatient Hospital Stay: Payer: Self-pay

## 2017-03-24 ENCOUNTER — Inpatient Hospital Stay: Payer: Self-pay

## 2017-03-25 ENCOUNTER — Inpatient Hospital Stay: Payer: Self-pay

## 2017-03-27 ENCOUNTER — Inpatient Hospital Stay: Payer: Self-pay

## 2017-03-27 ENCOUNTER — Emergency Department (HOSPITAL_BASED_OUTPATIENT_CLINIC_OR_DEPARTMENT_OTHER)
Admission: EM | Admit: 2017-03-27 | Discharge: 2017-03-28 | Disposition: A | Discharge status: Home / Self Care | Payer: Medicare Other | Attending: Physician Assistant | Admitting: Physician Assistant

## 2017-03-27 DIAGNOSIS — F329 Major depressive disorder, single episode, unspecified: Secondary | ICD-10-CM | POA: Insufficient documentation

## 2017-03-27 DIAGNOSIS — R45851 Suicidal ideations: Secondary | ICD-10-CM

## 2017-03-27 DIAGNOSIS — Z9884 Bariatric surgery status: Secondary | ICD-10-CM | POA: Insufficient documentation

## 2017-03-27 DIAGNOSIS — R000 Tachycardia, unspecified: Secondary | ICD-10-CM | POA: Insufficient documentation

## 2017-03-27 DIAGNOSIS — F10129 Alcohol abuse with intoxication, unspecified: Secondary | ICD-10-CM | POA: Insufficient documentation

## 2017-03-27 DIAGNOSIS — Z590 Homelessness: Secondary | ICD-10-CM

## 2017-03-28 ENCOUNTER — Inpatient Hospital Stay: Payer: Self-pay

## 2017-03-28 ENCOUNTER — Other Ambulatory Visit: Payer: Self-pay

## 2017-03-28 DIAGNOSIS — R000 Tachycardia, unspecified: Secondary | ICD-10-CM

## 2017-03-28 DIAGNOSIS — R109 Unspecified abdominal pain: Secondary | ICD-10-CM

## 2017-03-28 DIAGNOSIS — Z79899 Other long term (current) drug therapy: Secondary | ICD-10-CM

## 2017-03-28 LAB — STANDARD DRUG SCREEN, URN
Acetaminophen Qualitative, URN: NEGATIVE
Alcohol (Ethyl), URN: 59 mg/dL — AB
Amphet/Methamphetamine Qual,URN: NEGATIVE
Barbiturate (Qual), URN: NEGATIVE
Benzodiazepines (Qual), URN: NEGATIVE
Cannabinoids (Qual), URN: NEGATIVE
Cocaine (Qual), URN: NEGATIVE
Methadone (Qual), URN: NEGATIVE
Opiates (Qual), URN: NEGATIVE
Phencyclidine (Qual), URN: NEGATIVE
Tricyclic Antidepressants, URN: NEGATIVE

## 2017-03-28 LAB — URINALYSIS COMPLETE, URN
Bacteria, URN: NONE SEEN
Bilirubin (Qual), URN: NEGATIVE
Epith Cells_Renal/Trans,URN: NEGATIVE /HPF
Glucose Qual, URN: NEGATIVE mg/dL
Ketones, URN: NEGATIVE mg/dL
Leukocyte Esterase, URN: NEGATIVE
Nitrite, URN: NEGATIVE
Occult Blood, URN: NEGATIVE
Protein (Alb Semiquant), URN: NEGATIVE mg/dL
RBC, URN: NEGATIVE /HPF
Specific Gravity, URN: 1.014 g/mL (ref 1.002–1.027)
WBC, URN: NEGATIVE /HPF
pH, URN: 6.5 (ref 5.0–8.0)

## 2017-03-28 LAB — CBC, DIFF
% Basophils: 1 %
% Eosinophils: 1 %
% Immature Granulocytes: 1 %
% Lymphocytes: 24 %
% Monocytes: 6 %
% Neutrophils: 67 %
% Nucleated RBC: 0 %
Absolute Eosinophil Count: 0.06 10*3/uL (ref 0.00–0.50)
Absolute Lymphocyte Count: 2.15 10*3/uL (ref 1.00–4.80)
Basophils: 0.06 10*3/uL (ref 0.00–0.20)
Hematocrit: 45 % (ref 36–45)
Hemoglobin: 14.5 g/dL (ref 11.5–15.5)
Immature Granulocytes: 0.05 10*3/uL (ref 0.00–0.05)
MCH: 30.5 pg (ref 27.3–33.6)
MCHC: 32.3 g/dL (ref 32.2–36.5)
MCV: 95 fL (ref 81–98)
Monocytes: 0.49 10*3/uL (ref 0.00–0.80)
Neutrophils: 6 10*3/uL (ref 1.80–7.00)
Nucleated RBC: 0 10*3/uL
Platelet Count: 116 10*3/uL — ABNORMAL LOW (ref 150–400)
RBC: 4.75 10*6/uL (ref 3.80–5.00)
RDW-CV: 16.6 % — ABNORMAL HIGH (ref 11.6–14.4)
WBC: 8.81 10*3/uL (ref 4.30–10.00)

## 2017-03-28 LAB — BASIC METABOLIC PANEL
Anion Gap: 10 (ref 4–12)
Calcium: 8.6 mg/dL — ABNORMAL LOW (ref 8.9–10.2)
Carbon Dioxide, Total: 24 meq/L (ref 22–32)
Chloride: 102 meq/L (ref 98–108)
Creatinine: 0.83 mg/dL (ref 0.38–1.02)
GFR, Calc, African American: >60 mL/min/{1.73_m2} (ref >59)
GFR, Calc, European American: >60 mL/min/{1.73_m2} (ref >59)
Glucose: 119 mg/dL (ref 62–125)
Potassium: 4.6 meq/L (ref 3.6–5.2)
Sodium: 136 meq/L (ref 135–145)
Urea Nitrogen: 9 mg/dL (ref 8–21)

## 2017-03-28 LAB — LAB ADD ON ORDER

## 2017-03-28 LAB — TSH WITH REFLEXIVE FREE T4: TSH with Reflexive Free T4: 6.152 u[IU]/mL — ABNORMAL HIGH (ref 0.400–5.000)

## 2017-03-28 LAB — MAGNESIUM: Magnesium: 1.8 mg/dL (ref 1.8–2.4)

## 2017-03-28 LAB — TROPONIN_I
Troponin_I Interpretation: NORMAL
Troponin_I: <0.03 ng/mL (ref <0.04)

## 2017-03-28 LAB — PHOSPHATE: Phosphate: 4.1 mg/dL (ref 2.5–4.5)

## 2017-03-28 LAB — T4, FREE: Thyroxine (Free): 0.9 ng/dL (ref 0.6–1.2)

## 2017-03-29 ENCOUNTER — Emergency Department
Admission: EM | Admit: 2017-03-29 | Discharge: 2017-03-29 | Disposition: A | Discharge status: Home / Self Care | Payer: Medicare Other | Attending: Emergency Medicine | Admitting: Emergency Medicine

## 2017-03-29 ENCOUNTER — Inpatient Hospital Stay: Payer: Self-pay

## 2017-03-29 DIAGNOSIS — R109 Unspecified abdominal pain: Secondary | ICD-10-CM | POA: Insufficient documentation

## 2017-03-29 DIAGNOSIS — Z79899 Other long term (current) drug therapy: Secondary | ICD-10-CM | POA: Insufficient documentation

## 2017-03-29 LAB — URINALYSIS WITH REFLEX CULTURE
Bacteria, URN: NONE SEEN
Bilirubin (Qual), URN: NEGATIVE
Epith Cells_Renal/Trans,URN: NEGATIVE /HPF
Glucose Qual, URN: NEGATIVE mg/dL
Ketones, URN: NEGATIVE mg/dL
Leukocyte Esterase, URN: NEGATIVE
Nitrite, URN: NEGATIVE
Occult Blood, URN: NEGATIVE
Protein (Alb Semiquant), URN: NEGATIVE mg/dL
RBC, URN: NEGATIVE /HPF
Specific Gravity, URN: 1.005 g/mL (ref 1.002–1.027)
WBC, URN: NEGATIVE /HPF
pH, URN: 6 (ref 5.0–8.0)

## 2017-03-29 LAB — PREGNANCY (HCG), SERUM, QUANT: Pregnancy (HCG), SRM: <1 m[IU]/mL (ref <6)

## 2017-03-29 LAB — COMPREHENSIVE METABOLIC PANEL
ALT (GPT): 12 U/L (ref 7–33)
AST (GOT): 20 U/L (ref 9–38)
Albumin: 3.6 g/dL (ref 3.5–5.2)
Alkaline Phosphatase (Total): 92 U/L (ref 25–112)
Anion Gap: 13 — ABNORMAL HIGH (ref 4–12)
Bilirubin (Total): 0.6 mg/dL (ref 0.2–1.3)
Calcium: 8.4 mg/dL — ABNORMAL LOW (ref 8.9–10.2)
Carbon Dioxide, Total: 24 meq/L (ref 22–32)
Chloride: 102 meq/L (ref 98–108)
Creatinine: 0.63 mg/dL (ref 0.38–1.02)
GFR, Calc, African American: >60 mL/min/{1.73_m2} (ref >59)
GFR, Calc, European American: >60 mL/min/{1.73_m2} (ref >59)
Glucose: 94 mg/dL (ref 62–125)
Potassium: 4.1 meq/L (ref 3.6–5.2)
Protein (Total): 6.1 g/dL (ref 6.0–8.2)
Sodium: 139 meq/L (ref 135–145)
Urea Nitrogen: 6 mg/dL — ABNORMAL LOW (ref 8–21)

## 2017-03-29 LAB — CBC, DIFF
% Basophils: 0 %
% Eosinophils: 1 %
% Immature Granulocytes: 0 %
% Lymphocytes: 44 %
% Monocytes: 5 %
% Neutrophils: 50 %
% Nucleated RBC: 0 %
Absolute Eosinophil Count: 0.07 10*3/uL (ref 0.00–0.50)
Absolute Lymphocyte Count: 3.35 10*3/uL (ref 1.00–4.80)
Basophils: 0.02 10*3/uL (ref 0.00–0.20)
Hematocrit: 38 % (ref 36–45)
Hemoglobin: 12.2 g/dL (ref 11.5–15.5)
Immature Granulocytes: 0.02 10*3/uL (ref 0.00–0.05)
MCH: 30 pg (ref 27.3–33.6)
MCHC: 32.4 g/dL (ref 32.2–36.5)
MCV: 93 fL (ref 81–98)
Monocytes: 0.39 10*3/uL (ref 0.00–0.80)
Neutrophils: 3.77 10*3/uL (ref 1.80–7.00)
Nucleated RBC: 0 10*3/uL
Platelet Count: 159 10*3/uL (ref 150–400)
RBC: 4.07 10*6/uL (ref 3.80–5.00)
RDW-CV: 16.5 % — ABNORMAL HIGH (ref 11.6–14.4)
WBC: 7.62 10*3/uL (ref 4.3–10.0)

## 2017-03-29 LAB — LIPASE: Lipase: 12 U/L (ref <70)

## 2017-03-30 ENCOUNTER — Emergency Department (HOSPITAL_BASED_OUTPATIENT_CLINIC_OR_DEPARTMENT_OTHER)
Admission: EM | Admit: 2017-03-30 | Discharge: 2017-03-31 | Disposition: A | Discharge status: Home / Self Care | Payer: Medicare Other | Attending: Emergency Medicine | Admitting: Emergency Medicine

## 2017-03-30 ENCOUNTER — Inpatient Hospital Stay: Payer: Self-pay

## 2017-03-30 DIAGNOSIS — F10129 Alcohol abuse with intoxication, unspecified: Secondary | ICD-10-CM

## 2017-03-30 DIAGNOSIS — F419 Anxiety disorder, unspecified: Secondary | ICD-10-CM | POA: Insufficient documentation

## 2017-03-30 DIAGNOSIS — Y906 Blood alcohol level of 120-199 mg/100 ml: Secondary | ICD-10-CM | POA: Insufficient documentation

## 2017-03-30 DIAGNOSIS — F329 Major depressive disorder, single episode, unspecified: Secondary | ICD-10-CM | POA: Insufficient documentation

## 2017-03-30 DIAGNOSIS — Z9884 Bariatric surgery status: Secondary | ICD-10-CM | POA: Insufficient documentation

## 2017-03-30 DIAGNOSIS — W1839XA Other fall on same level, initial encounter: Secondary | ICD-10-CM | POA: Insufficient documentation

## 2017-03-30 DIAGNOSIS — N390 Urinary tract infection, site not specified: Secondary | ICD-10-CM | POA: Insufficient documentation

## 2017-03-30 DIAGNOSIS — Z79899 Other long term (current) drug therapy: Secondary | ICD-10-CM | POA: Insufficient documentation

## 2017-03-30 DIAGNOSIS — F1510 Other stimulant abuse, uncomplicated: Secondary | ICD-10-CM

## 2017-03-30 DIAGNOSIS — N3090 Cystitis, unspecified without hematuria: Secondary | ICD-10-CM | POA: Insufficient documentation

## 2017-03-30 LAB — EKG 12 LEAD
Atrial Rate: 112 {beats}/min
Atrial Rate: 192 {beats}/min
P Axis: 63 degrees
P-R Interval: 150 ms
Q-T Interval: 248 ms
Q-T Interval: 340 ms
QRS Duration: 74 ms
QRS Duration: 92 ms
QTC Calculation: 444 ms
QTC Calculation: 464 ms
R Axis: -25 degrees
R Axis: -55 degrees
T Axis: -3 degrees
T Axis: 104 degrees
Ventricular Rate: 112 {beats}/min
Ventricular Rate: 193 {beats}/min

## 2017-03-31 ENCOUNTER — Ambulatory Visit (HOSPITAL_BASED_OUTPATIENT_CLINIC_OR_DEPARTMENT_OTHER): Payer: Medicare Other | Admitting: Clinical

## 2017-03-31 ENCOUNTER — Other Ambulatory Visit: Payer: Self-pay

## 2017-03-31 ENCOUNTER — Ambulatory Visit (HOSPITAL_BASED_OUTPATIENT_CLINIC_OR_DEPARTMENT_OTHER): Payer: Medicare Other

## 2017-03-31 DIAGNOSIS — F1020 Alcohol dependence, uncomplicated: Secondary | ICD-10-CM

## 2017-03-31 DIAGNOSIS — F322 Major depressive disorder, single episode, severe without psychotic features: Secondary | ICD-10-CM

## 2017-03-31 DIAGNOSIS — R9431 Abnormal electrocardiogram [ECG] [EKG]: Secondary | ICD-10-CM

## 2017-03-31 DIAGNOSIS — T1491XA Suicide attempt, initial encounter: Secondary | ICD-10-CM

## 2017-03-31 LAB — URINALYSIS WITH REFLEX CULTURE
Bilirubin (Qual), URN: NEGATIVE
Bilirubin (Qual), URN: NEGATIVE
Epith Cells_Renal/Trans,URN: NEGATIVE /HPF
Epith Cells_Renal/Trans,URN: NEGATIVE /HPF
Epith Cells_Squamous, URN: NEGATIVE /LPF
Glucose Qual, URN: NEGATIVE mg/dL
Glucose Qual, URN: NEGATIVE mg/dL
Ketones, URN: NEGATIVE mg/dL
Ketones, URN: NEGATIVE mg/dL
Leukocyte Esterase, URN: POSITIVE — AB
Leukocyte Esterase, URN: POSITIVE — AB
Nitrite, URN: NEGATIVE
Nitrite, URN: POSITIVE — AB
Occult Blood, URN: NEGATIVE
Protein (Alb Semiquant), URN: NEGATIVE mg/dL
Protein (Alb Semiquant), URN: NEGATIVE mg/dL
RBC, URN: NEGATIVE /HPF
Specific Gravity, URN: 1.002 g/mL (ref 1.002–1.027)
Specific Gravity, URN: 1.01 g/mL (ref 1.002–1.027)
pH, URN: 6.5 (ref 5.0–8.0)
pH, URN: 6.5 (ref 5.0–8.0)

## 2017-03-31 LAB — CBC, DIFF
% Basophils: 0 %
% Eosinophils: 0 %
% Immature Granulocytes: 0 %
% Lymphocytes: 25 %
% Monocytes: 5 %
% Neutrophils: 70 %
% Nucleated RBC: 0 %
Absolute Eosinophil Count: 0.04 10*3/uL (ref 0.00–0.50)
Absolute Lymphocyte Count: 2.28 10*3/uL (ref 1.00–4.80)
Basophils: 0.03 10*3/uL (ref 0.00–0.20)
Hematocrit: 42 % (ref 36–45)
Hemoglobin: 13.1 g/dL (ref 11.5–15.5)
Immature Granulocytes: 0.04 10*3/uL (ref 0.00–0.05)
MCH: 29.8 pg (ref 27.3–33.6)
MCHC: 31.5 g/dL — ABNORMAL LOW (ref 32.2–36.5)
MCV: 95 fL (ref 81–98)
Monocytes: 0.5 10*3/uL (ref 0.00–0.80)
Neutrophils: 6.42 10*3/uL (ref 1.80–7.00)
Nucleated RBC: 0 10*3/uL
Platelet Count: 252 10*3/uL (ref 150–400)
RBC: 4.4 10*6/uL (ref 3.80–5.00)
RDW-CV: 16.4 % — ABNORMAL HIGH (ref 11.6–14.4)
WBC: 9.31 10*3/uL (ref 4.30–10.00)

## 2017-03-31 LAB — EKG 12 LEAD
Atrial Rate: 84 {beats}/min
Atrial Rate: 82 {beats}/min
Diagnosis: NORMAL
Diagnosis: NORMAL
P Axis: 59 degrees
P Axis: 57 degrees
P-R Interval: 150 ms
P-R Interval: 144 ms
Q-T Interval: 460 ms
Q-T Interval: 450 ms
QRS Duration: 94 ms
QRS Duration: 90 ms
QTC Calculation: 543 ms
QTC Calculation: 525 ms
R Axis: -14 degrees
R Axis: -14 degrees
T Axis: 15 degrees
T Axis: -15 degrees
Ventricular Rate: 84 {beats}/min
Ventricular Rate: 82 {beats}/min

## 2017-03-31 LAB — COMPREHENSIVE METABOLIC PANEL
ALT (GPT): 15 U/L (ref 7–33)
AST (GOT): 15 U/L (ref 9–38)
Albumin: 3.8 g/dL (ref 3.5–5.2)
Alkaline Phosphatase (Total): 91 U/L (ref 25–112)
Anion Gap: 10 (ref 4–12)
Bilirubin (Total): 0.7 mg/dL (ref 0.2–1.3)
Calcium: 8.7 mg/dL — ABNORMAL LOW (ref 8.9–10.2)
Carbon Dioxide, Total: 26 meq/L (ref 22–32)
Chloride: 101 meq/L (ref 98–108)
Creatinine: 0.73 mg/dL (ref 0.38–1.02)
GFR, Calc, African American: >60 mL/min/{1.73_m2} (ref >59)
GFR, Calc, European American: >60 mL/min/{1.73_m2} (ref >59)
Glucose: 86 mg/dL (ref 62–125)
Potassium: 3.5 meq/L — ABNORMAL LOW (ref 3.6–5.2)
Protein (Total): 6.4 g/dL (ref 6.0–8.2)
Sodium: 137 meq/L (ref 135–145)
Urea Nitrogen: 6 mg/dL — ABNORMAL LOW (ref 8–21)

## 2017-03-31 LAB — REFLEX CULTURE FOR UA

## 2017-03-31 LAB — COMPREHENSIVE DRUG SCREEN, URN
Acetaminophen Qualitative, URN: NEGATIVE
Alcohol (Ethyl), URN: 179 mg/dL — AB
Amphet/Methamphetamine Qual,URN: NEGATIVE
Barbiturate (Qual), URN: NEGATIVE
Benzodiazepines (Qual), URN: NEGATIVE
Cannabinoids (Qual), URN: NEGATIVE
Cocaine (Qual), URN: NEGATIVE
Drugs Detected By GC MS: DETECTED — AB
Methadone (Qual), URN: NEGATIVE
Opiates (Qual), URN: NEGATIVE
Phencyclidine (Qual), URN: NEGATIVE
Salicylate (Qual): NEGATIVE
Tricyclic Antidepressants, URN: NEGATIVE

## 2017-03-31 LAB — ACETAMINOPHEN (TYLENOL): Acetaminophen (Tylenol): <10 ug/mL (ref 0–25)

## 2017-03-31 LAB — ALCOHOL (ETHYL): Alcohol (Ethyl): 158 mg/dL — AB

## 2017-03-31 LAB — SALICYLATE: Salicylate: <3 mg/dL (ref 0–30)
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
## 2017-04-01 ENCOUNTER — Other Ambulatory Visit (HOSPITAL_BASED_OUTPATIENT_CLINIC_OR_DEPARTMENT_OTHER): Payer: Self-pay | Admitting: Emergency Medicine

## 2017-04-01 ENCOUNTER — Emergency Department (HOSPITAL_BASED_OUTPATIENT_CLINIC_OR_DEPARTMENT_OTHER)
Admission: EM | Admit: 2017-04-01 | Discharge: 2017-04-01 | Payer: Medicare Other | Attending: Emergency Medicine | Admitting: Emergency Medicine

## 2017-04-01 DIAGNOSIS — Z590 Homelessness: Secondary | ICD-10-CM | POA: Insufficient documentation

## 2017-04-01 DIAGNOSIS — L03115 Cellulitis of right lower limb: Secondary | ICD-10-CM | POA: Insufficient documentation

## 2017-04-02 ENCOUNTER — Inpatient Hospital Stay: Payer: Self-pay

## 2017-04-02 ENCOUNTER — Ambulatory Visit (HOSPITAL_BASED_OUTPATIENT_CLINIC_OR_DEPARTMENT_OTHER): Payer: Medicare Other | Admitting: Counselor

## 2017-04-02 LAB — URINE C/S: Culture: >100000
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
## 2017-04-04 ENCOUNTER — Inpatient Hospital Stay: Payer: Self-pay

## 2017-04-07 ENCOUNTER — Encounter (HOSPITAL_BASED_OUTPATIENT_CLINIC_OR_DEPARTMENT_OTHER): Payer: Medicare Other | Admitting: Unknown Physician Specialty

## 2017-04-09 ENCOUNTER — Ambulatory Visit (HOSPITAL_BASED_OUTPATIENT_CLINIC_OR_DEPARTMENT_OTHER): Payer: Medicare Other | Attending: Addiction Psychiatry | Admitting: Counselor

## 2017-04-09 DIAGNOSIS — F332 Major depressive disorder, recurrent severe without psychotic features: Secondary | ICD-10-CM
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
## 2017-04-10 ENCOUNTER — Inpatient Hospital Stay: Payer: Self-pay

## 2017-04-11 ENCOUNTER — Inpatient Hospital Stay: Payer: Self-pay

## 2017-04-13 ENCOUNTER — Telehealth (HOSPITAL_BASED_OUTPATIENT_CLINIC_OR_DEPARTMENT_OTHER): Payer: Self-pay | Admitting: Counselor

## 2017-04-13 ENCOUNTER — Encounter (HOSPITAL_BASED_OUTPATIENT_CLINIC_OR_DEPARTMENT_OTHER): Payer: Self-pay | Admitting: Clinical

## 2017-04-13 DIAGNOSIS — F1020 Alcohol dependence, uncomplicated: Secondary | ICD-10-CM

## 2017-04-13 NOTE — Telephone Encounter (Signed)
[facility] Brief Case Management Note            Encounter Location:  [_] In Clinic     [_] PES  [_] Consumer's Residence   [_] Inpatient Psychiatric Facility  [_] Hospital Emergency Room  [_] Jail   Other: Via phone     Case Management Topics:  [_] Bus Ticket  [_] Hygiene  [_] Laundry  [_] Meal Ticket  [_] Medication  [_] Money  [_] Phone Call   Schedule/Reschedule Appointment   [_] Other: _    Comment: This Clinical research associate received a voice message from [place], a provider at [facility]. She requested a call back from TW to discharge plan/schedule follow up appointments. TW returned call and was unable to reach anyone at the phone number she provided.     Plan/Follow up: Return call at a later time.     Wandra Arthurs, MSW,CDPT  Mental Health Practitioner  [facility]  Phone: [phone]

## 2017-04-13 NOTE — Progress Notes (Signed)
Social worker [place] Sink [phone] called TW saying Pt is inpatient there for detox from alcohol.  Plan is to find extended residential sobriety housing, but this is contingent on bed availability.  We agreed to postpone tomorrow's [facility] intake to Friday at 3pm when she will meet her primary CM [place].

## 2017-04-14 ENCOUNTER — Encounter (HOSPITAL_BASED_OUTPATIENT_CLINIC_OR_DEPARTMENT_OTHER): Payer: Medicare Other | Admitting: Unknown Physician Specialty

## 2017-04-14 ENCOUNTER — Ambulatory Visit (HOSPITAL_BASED_OUTPATIENT_CLINIC_OR_DEPARTMENT_OTHER): Payer: Medicare Other | Admitting: Counselor

## 2017-04-17 ENCOUNTER — Emergency Department (EMERGENCY_DEPARTMENT_HOSPITAL)
Admission: EM | Admit: 2017-04-17 | Discharge: 2017-04-18 | Disposition: A | Discharge status: Home / Self Care | Payer: Medicare Other | Source: Home / Self Care

## 2017-04-17 ENCOUNTER — Other Ambulatory Visit: Payer: Self-pay | Admitting: Emergency Medicine

## 2017-04-17 ENCOUNTER — Emergency Department (HOSPITAL_BASED_OUTPATIENT_CLINIC_OR_DEPARTMENT_OTHER)
Admission: EM | Admit: 2017-04-17 | Discharge: 2017-04-17 | Disposition: A | Discharge status: Home / Self Care | Payer: Medicare Other | Attending: Emergency Medicine | Admitting: Emergency Medicine

## 2017-04-17 ENCOUNTER — Ambulatory Visit (HOSPITAL_BASED_OUTPATIENT_CLINIC_OR_DEPARTMENT_OTHER): Payer: Medicare Other | Admitting: Counselor

## 2017-04-17 DIAGNOSIS — R42 Dizziness and giddiness: Secondary | ICD-10-CM | POA: Insufficient documentation

## 2017-04-17 DIAGNOSIS — I10 Essential (primary) hypertension: Secondary | ICD-10-CM

## 2017-04-17 DIAGNOSIS — F10129 Alcohol abuse with intoxication, unspecified: Secondary | ICD-10-CM | POA: Insufficient documentation

## 2017-04-17 DIAGNOSIS — F419 Anxiety disorder, unspecified: Secondary | ICD-10-CM | POA: Insufficient documentation

## 2017-04-17 DIAGNOSIS — Z9884 Bariatric surgery status: Secondary | ICD-10-CM | POA: Insufficient documentation

## 2017-04-17 DIAGNOSIS — R400 Somnolence: Secondary | ICD-10-CM | POA: Insufficient documentation

## 2017-04-17 DIAGNOSIS — G43909 Migraine, unspecified, not intractable, without status migrainosus: Secondary | ICD-10-CM | POA: Insufficient documentation

## 2017-04-17 DIAGNOSIS — D519 Vitamin B12 deficiency anemia, unspecified: Secondary | ICD-10-CM

## 2017-04-17 DIAGNOSIS — Z590 Homelessness: Secondary | ICD-10-CM

## 2017-04-17 DIAGNOSIS — R51 Headache: Secondary | ICD-10-CM

## 2017-04-17 DIAGNOSIS — F329 Major depressive disorder, single episode, unspecified: Secondary | ICD-10-CM | POA: Insufficient documentation

## 2017-04-17 LAB — CBC, DIFF
% Basophils: 1 %
% Eosinophils: 1 %
% Immature Granulocytes: 0 %
% Lymphocytes: 27 %
% Monocytes: 7 %
% Neutrophils: 64 %
% Nucleated RBC: 0 %
Absolute Eosinophil Count: 0.09 10*3/uL (ref 0.00–0.50)
Absolute Lymphocyte Count: 2.54 10*3/uL (ref 1.00–4.80)
Basophils: 0.05 10*3/uL (ref 0.00–0.20)
Hematocrit: 36 % (ref 36–45)
Hemoglobin: 11.6 g/dL (ref 11.5–15.5)
Immature Granulocytes: 0.03 10*3/uL (ref 0.00–0.05)
MCH: 31.2 pg (ref 27.3–33.6)
MCHC: 32.1 g/dL — ABNORMAL LOW (ref 32.2–36.5)
MCV: 97 fL (ref 81–98)
Monocytes: 0.62 10*3/uL (ref 0.00–0.80)
Neutrophils: 6.23 10*3/uL (ref 1.80–7.00)
Nucleated RBC: 0 10*3/uL
Platelet Count: 266 10*3/uL (ref 150–400)
RBC: 3.72 10*6/uL — ABNORMAL LOW (ref 3.80–5.00)
RDW-CV: 17.3 % — ABNORMAL HIGH (ref 11.6–14.4)
WBC: 9.56 10*3/uL (ref 4.30–10.00)

## 2017-04-17 LAB — BASIC METABOLIC PANEL
Anion Gap: 8 (ref 4–12)
Calcium: 8.6 mg/dL — ABNORMAL LOW (ref 8.9–10.2)
Carbon Dioxide, Total: 25 meq/L (ref 22–32)
Chloride: 105 meq/L (ref 98–108)
Creatinine: 0.58 mg/dL (ref 0.38–1.02)
GFR, Calc, African American: >60 mL/min/{1.73_m2} (ref >59)
GFR, Calc, European American: >60 mL/min/{1.73_m2} (ref >59)
Glucose: 95 mg/dL (ref 62–125)
Potassium: 3.8 meq/L (ref 3.6–5.2)
Sodium: 138 meq/L (ref 135–145)
Urea Nitrogen: 11 mg/dL (ref 8–21)

## 2017-04-17 LAB — LAB ADD ON ORDER

## 2017-04-18 LAB — VITAMIN B12 (COBALAMIN): Vitamin B12 (Cobalamin): 139 pg/mL — ABNORMAL LOW (ref 180–914)

## 2017-04-19 ENCOUNTER — Inpatient Hospital Stay: Payer: Self-pay

## 2017-04-20 LAB — EKG 12 LEAD
Atrial Rate: 77 {beats}/min
Diagnosis: NORMAL
P Axis: 69 degrees
P-R Interval: 152 ms
Q-T Interval: 414 ms
QRS Duration: 94 ms
QTC Calculation: 468 ms
R Axis: -27 degrees
T Axis: 34 degrees
Ventricular Rate: 77 {beats}/min

## 2017-04-23 ENCOUNTER — Other Ambulatory Visit (INDEPENDENT_AMBULATORY_CARE_PROVIDER_SITE_OTHER): Payer: Self-pay | Admitting: Family Medicine

## 2017-04-23 DIAGNOSIS — R059 Cough, unspecified: Secondary | ICD-10-CM

## 2017-04-23 DIAGNOSIS — F325 Major depressive disorder, single episode, in full remission: Secondary | ICD-10-CM

## 2017-04-23 MED ORDER — DULOXETINE HCL 60 MG OR CPEP
60.0000 mg | DELAYED_RELEASE_CAPSULE | Freq: Every day | ORAL | 11 refills | Status: DC
Start: 2017-04-23 — End: 2017-09-09

## 2017-04-23 MED ORDER — ALBUTEROL SULFATE HFA 108 (90 BASE) MCG/ACT IN AERS
2.0000 | INHALATION_SPRAY | Freq: Four times a day (QID) | RESPIRATORY_TRACT | 5 refills | Status: DC | PRN
Start: 2017-04-23 — End: 2017-09-29

## 2017-04-23 MED ORDER — DULOXETINE HCL 30 MG OR CPEP
30.0000 mg | DELAYED_RELEASE_CAPSULE | Freq: Every day | ORAL | 11 refills | Status: DC
Start: 2017-04-23 — End: 2017-09-09

## 2017-04-24 ENCOUNTER — Other Ambulatory Visit: Payer: Self-pay | Admitting: Pharmacist

## 2017-04-27 ENCOUNTER — Inpatient Hospital Stay: Payer: Self-pay

## 2017-04-27 ENCOUNTER — Ambulatory Visit (HOSPITAL_BASED_OUTPATIENT_CLINIC_OR_DEPARTMENT_OTHER): Payer: Self-pay

## 2017-05-07 ENCOUNTER — Inpatient Hospital Stay: Payer: Self-pay

## 2017-05-07 ENCOUNTER — Other Ambulatory Visit: Payer: Self-pay | Admitting: Physician Assistant

## 2017-05-07 ENCOUNTER — Emergency Department (HOSPITAL_BASED_OUTPATIENT_CLINIC_OR_DEPARTMENT_OTHER)
Admission: EM | Admit: 2017-05-07 | Discharge: 2017-05-08 | Disposition: A | Discharge status: Home / Self Care | Payer: Medicare Other | Attending: Physician Assistant | Admitting: Physician Assistant

## 2017-05-07 DIAGNOSIS — I10 Essential (primary) hypertension: Secondary | ICD-10-CM

## 2017-05-07 DIAGNOSIS — M5432 Sciatica, left side: Secondary | ICD-10-CM | POA: Insufficient documentation

## 2017-05-07 DIAGNOSIS — W1830XA Fall on same level, unspecified, initial encounter: Secondary | ICD-10-CM | POA: Insufficient documentation

## 2017-05-07 DIAGNOSIS — Z590 Homelessness: Secondary | ICD-10-CM | POA: Insufficient documentation

## 2017-05-07 DIAGNOSIS — F10129 Alcohol abuse with intoxication, unspecified: Secondary | ICD-10-CM

## 2017-05-07 DIAGNOSIS — Y906 Blood alcohol level of 120-199 mg/100 ml: Secondary | ICD-10-CM | POA: Insufficient documentation

## 2017-05-07 DIAGNOSIS — F1020 Alcohol dependence, uncomplicated: Secondary | ICD-10-CM

## 2017-05-07 DIAGNOSIS — F1024 Alcohol dependence with alcohol-induced mood disorder: Secondary | ICD-10-CM

## 2017-05-07 DIAGNOSIS — S50312A Abrasion of left elbow, initial encounter: Secondary | ICD-10-CM | POA: Insufficient documentation

## 2017-05-07 DIAGNOSIS — Z720 Tobacco use: Secondary | ICD-10-CM | POA: Insufficient documentation

## 2017-05-07 DIAGNOSIS — S59902A Unspecified injury of left elbow, initial encounter: Secondary | ICD-10-CM | POA: Insufficient documentation

## 2017-05-07 DIAGNOSIS — W19XXXA Unspecified fall, initial encounter: Secondary | ICD-10-CM

## 2017-05-08 LAB — ALCOHOL (ETHYL): Alcohol (Ethyl): 156 mg/dL — AB

## 2017-05-12 ENCOUNTER — Other Ambulatory Visit: Payer: Self-pay | Admitting: Emergency Medicine

## 2017-05-12 ENCOUNTER — Emergency Department
Admission: EM | Admit: 2017-05-12 | Discharge: 2017-05-12 | Disposition: A | Discharge status: Home / Self Care | Payer: Medicare Other | Attending: Emergency Medicine | Admitting: Emergency Medicine

## 2017-05-12 DIAGNOSIS — G8911 Acute pain due to trauma: Secondary | ICD-10-CM | POA: Insufficient documentation

## 2017-05-12 DIAGNOSIS — Z87891 Personal history of nicotine dependence: Secondary | ICD-10-CM | POA: Insufficient documentation

## 2017-05-12 DIAGNOSIS — M545 Low back pain: Secondary | ICD-10-CM

## 2017-05-12 DIAGNOSIS — Z590 Homelessness: Secondary | ICD-10-CM | POA: Insufficient documentation

## 2017-05-12 DIAGNOSIS — M25552 Pain in left hip: Secondary | ICD-10-CM | POA: Insufficient documentation

## 2017-05-15 ENCOUNTER — Encounter (HOSPITAL_BASED_OUTPATIENT_CLINIC_OR_DEPARTMENT_OTHER): Payer: Medicare Other | Admitting: Student in an Organized Health Care Education/Training Program

## 2017-05-18 ENCOUNTER — Inpatient Hospital Stay: Payer: Self-pay

## 2017-05-20 ENCOUNTER — Inpatient Hospital Stay: Payer: Self-pay

## 2017-05-22 ENCOUNTER — Inpatient Hospital Stay: Payer: Self-pay

## 2017-05-25 ENCOUNTER — Inpatient Hospital Stay: Payer: Self-pay

## 2017-05-27 ENCOUNTER — Telehealth (HOSPITAL_BASED_OUTPATIENT_CLINIC_OR_DEPARTMENT_OTHER): Payer: Self-pay | Admitting: Counselor

## 2017-05-27 NOTE — Telephone Encounter (Signed)
[facility] Brief Case Management Note            Encounter Location:  [_] In Clinic     [_] PES  [_] Consumer's Residence   [_] Inpatient Psychiatric Facility  [_] Hospital Emergency Room  [_] Jail  [X]  Other: Via phone     Case Management Topics:  [_] Bus Ticket  [_] Hygiene  [_] Laundry  [_] Meal Ticket  [_] Medication  [_] Money  [_] Phone Call  [x]  Schedule/Reschedule Appointment   [_] Other: _    Comment: This Clinical research associatewriter reached out to check in with client and schedule an appointment. Client's phone was unavailable. TW was unable to leave voice message.     Plan/Follow Up: TW will reach out to client in 2 weeks.     Sharon Stephens, MSW,CDPT  Mental Health Practitioner  [facility]  Phone: [phone]

## 2017-06-02 ENCOUNTER — Ambulatory Visit (INDEPENDENT_AMBULATORY_CARE_PROVIDER_SITE_OTHER): Payer: Medicare Other | Admitting: Family Medicine

## 2017-06-02 VITALS — BP 139/86 | HR 83 | Temp 97.6°F | Resp 18 | Wt >= 6400 oz

## 2017-06-02 DIAGNOSIS — E538 Deficiency of other specified B group vitamins: Secondary | ICD-10-CM

## 2017-06-02 DIAGNOSIS — M1711 Unilateral primary osteoarthritis, right knee: Secondary | ICD-10-CM

## 2017-06-02 DIAGNOSIS — B379 Candidiasis, unspecified: Secondary | ICD-10-CM

## 2017-06-02 DIAGNOSIS — Z9884 Bariatric surgery status: Secondary | ICD-10-CM

## 2017-06-02 DIAGNOSIS — J069 Acute upper respiratory infection, unspecified: Secondary | ICD-10-CM

## 2017-06-02 LAB — PR WET PREP/KOH/TEST, ONSITE
Odor: ABSENT
RBC, Wet Prep: NEGATIVE
Trich: NEGATIVE

## 2017-06-02 MED ORDER — DICLOFENAC SODIUM 1 % TD GEL
2.0000 g | Freq: Four times a day (QID) | TRANSDERMAL | 0 refills | Status: DC | PRN
Start: 2017-06-02 — End: 2017-06-16

## 2017-06-02 MED ORDER — BENZONATATE 200 MG OR CAPS
200.0000 mg | ORAL_CAPSULE | Freq: Three times a day (TID) | ORAL | 0 refills | Status: AC | PRN
Start: 2017-06-02 — End: 2017-06-09

## 2017-06-02 MED ORDER — FLUCONAZOLE 150 MG OR TABS
150.0000 mg | ORAL_TABLET | Freq: Once | ORAL | 0 refills | Status: AC
Start: 2017-06-02 — End: 2017-06-02

## 2017-06-02 MED ORDER — CYANOCOBALAMIN 1000 MCG/ML IJ SOLN
1000.0000 ug | Freq: Once | INTRAMUSCULAR | Status: AC
Start: 2017-06-02 — End: 2017-06-02
  Administered 2017-06-02: 1000 ug via INTRAMUSCULAR

## 2017-06-02 NOTE — Progress Notes (Signed)
Medication Administrations This Visit        cyanocobalamin (Vitamin B12) 1,000 mcg/mL injection Admin Date  06/02/2017  15:28 Action  Given Dose  1000 mcg Route  Intramuscular Site  Right Deltoid Administered By  Karma Greaserran, Sameria Morss T    Ordering Provider:  Lasandra BeechBurke, Matthew Thomas, MD    NDC:  [phone]    Lot#:  [id number]    Comments:  exp. 08/2017 by APP          Gerlene Feehau T Asuzena Weis, CMA

## 2017-06-02 NOTE — Patient Instructions (Signed)
It was a pleasure seeing you today!    1.  You have been diagnosed with an Upper Respiratory Infection (URI).  More than 90% of the time these come from viruses and can cause congestion, cough, headaches, chills, low grade fevers and muscle aches.   They are not dangerous and typically fully resolve in 1-2 weeks.  I have also given you BENZONATATE 200mg  to use three times daily if needed for cough suppression.  If you get worse, your fever goes over 102 or something changes, please let us know!    2.  Knee pain.  I have made you a referral to PHYSICAL THERAPY.  They will call to set up an appointment.  I also called in DICLOFENAC gel for pain (4x/day as needed).    3.   Vaginal discharge.  This is another YEAST infection.  I have called in the FLUCONAZOLE 150mg , to use once.    4.   B12.  We'll do another B12 shot in the office today.  [facility], please return to clinic in 1 month and we'll do another (right around your birthday).      Please call with any questions and feel better soon!
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
## 2017-06-02 NOTE — Progress Notes (Signed)
Chief Complaint   Patient presents with   . URI     Concerns of ears and throat are sore; been sick for 3 days, had bronchitis 1.5 month ago and is returning.    . Knee Pain     Right knee and back pain X 2 weeks and is getting worse. It hurts to put weight on it.    . Vaginal Problem     Concerns of vaginal "yeast", itching, and nasty cottage cheese discharged.      Last Vitamin B-12 given 2 [facility] ago in [facility], due for another dose, Hx of Gastric Bypass surgery, 2011.    Have you seen a specialist since your last visit: NO          06/02/2017  WAIIS / [facility] has been reviewed/entered in Epic: {y/n:YES  HM DUE : YES (hiv and pap)    PHQ: 04/15/2017   Gerlene Feehau T Krisha Beegle, CMA    Health Maintenance   Topic Date Due   . HIV Screening  07/03/1993   . Cervical Cancer Screening (Pap Smear)  11/11/2013   . Depression Monitoring (PHQ-9)  04/09/2018   . Tetanus Vaccine  05/05/2019   . Influenza Vaccine  Completed       Future Appointments  Date Time Provider Department Center   06/02/2017 3:00 PM Lasandra BeechBurke, Matthew Thomas, MD [facility]   06/09/2017 9:30 AM Nash MantisRuiz, [facility]   06/09/2017 2:30 PM Moshe CiproFeldman, [facility]ary [facility]

## 2017-06-09 ENCOUNTER — Ambulatory Visit (HOSPITAL_BASED_OUTPATIENT_CLINIC_OR_DEPARTMENT_OTHER): Payer: Medicare Other | Admitting: Addiction (Substance Use Disorder)

## 2017-06-09 ENCOUNTER — Ambulatory Visit (HOSPITAL_BASED_OUTPATIENT_CLINIC_OR_DEPARTMENT_OTHER): Payer: Medicare Other | Admitting: Counselor

## 2017-06-09 ENCOUNTER — Ambulatory Visit (HOSPITAL_BASED_OUTPATIENT_CLINIC_OR_DEPARTMENT_OTHER): Payer: Medicare Other

## 2017-06-09 ENCOUNTER — Encounter (HOSPITAL_BASED_OUTPATIENT_CLINIC_OR_DEPARTMENT_OTHER): Payer: Medicare Other | Admitting: Unknown Physician Specialty

## 2017-06-09 DIAGNOSIS — F1520 Other stimulant dependence, uncomplicated: Secondary | ICD-10-CM

## 2017-06-09 DIAGNOSIS — F1410 Cocaine abuse, uncomplicated: Secondary | ICD-10-CM

## 2017-06-09 DIAGNOSIS — F1020 Alcohol dependence, uncomplicated: Secondary | ICD-10-CM

## 2017-06-09 NOTE — Progress Notes (Signed)
[facility] Case Management Note            TREATMENT PLAN PROBLEMS ADDRESSED:  Basic & Self-Care Needs Not Met  Legal Problems/Incarceration  Psychiatric Symptomatology  Substance Abuse      TREATMENT PLAN IN  lNTERVENTIONS:   Assessment of Needs/Symptoms: Assess sterngths and challenges  Supportive Counseling: Build rapport, active listening, probing questions     MENTAL STATUS EXAM   Appearance:  good hygiene, casually dressed  Behavior: cooperative with interviewer  Speech: normal pace and volume.  Affect: restricted  Mood: "tired"  Thought form:  linear and goal directed   Thought content: denies SI/HI and AVH, denies paranoia  Orientation: full  Attention/concentration: somnolent   Memory: not tested   Insight and judgment: good    PRESENTATION: Client was present for 30 minute case management session.       EVALUATION: This Probation officer used this session to build rapport and learn client history. TW completed recovery plan during session. Client is interested in medication management, SUD treatment, housing support, individual and group therapy. Client reported that she is staying at [facility] until she can find permanent housing.       Plan/Follow-up:   Complete safety plan.  Housing intake 07/13/17  Client will complete SUD assessment 06/09/17      Daiva Huge, MSW,CDPT  Mental Health Practitioner  [facility]  Phone: [phone]          Recovery Plan - Individual Service Plan        Effective Date: 06/09/2017                                                     Next review date: 11/2017      Summary of goal attainment or significant events since intake or last plan:" Medication and counseling."  Leisure:   Transportation: Public transit     Psychiatric Diagnosis and Symptoms    Client symptoms meet the diagnostic criteria for: F33.2 Major depressive disorder,recurrent severe without psychotic features.    Client's description of symptoms past and current:   "Depression  and anxiety for years. It's that time of the year."  " I have experienced SI since 2012. I have attempted to end my life 3 times. The last being 2012-2013." I have been taking Fluoxetine since August 2018, it has been helpful."    Current symptoms are: Depressed mood, fatigue, fair sleep/appetite, and hopelessness.   Denied current SI/HI    Goal: "Being consistent."    Plan- Prevention/Intervention Strategies    Client to: meet with prescriber, take medication as prescribed, report side effects, report symptoms, use crisis line, inform staff of interventions that have been helpful in the past, meet with case manager as scheduled, attend scheduled group therapy, attend day support and attend psychotherapy sessions    Case Manager to: schedule regular appointments with provider, monitor symptoms, assess suicide risk, offer extra support as needed and refer for psychotherapy     Substance Use    Current Use:  Alcohol: Drink 3-4 days a week. About 2-3 drinks per day.   Marijuana and other drugs: Denied use.   Tobacco: Denied use.     Past history or treatment:   Hx of IOP/INP CD treatment, [facility].     Goal: "Sobriety."  Intervention/ Preventions Strategies:   Client to: attend AA, outpatient treatment and monitor use  Case Management Team to: encourage sobriety, validate efforts, monitor abuse and refer client to substance abuse treatment     Medical Coordination/Integration    Medical needs currently identified by the patient and/or providers: None reported.     Primary Care Provider most recently seen by the patient:   [facility]. No permanent MD.     Patient's Medical Insurance: Medicare/Medicaid        Client: Is independent in managing medical needs     Goal: N/a    Plan:   None needed     Psycho Social Domains    Housing    Current Housing situation: [facility].     Goal: Find permanent housing.     Plan: Work with Neurosurgeon at [employer] [facility] to apply for housing.   Client to:  work with housing staff to find housing  Case Management Team to: refer to housing specialist and assign peer specialist to assist      Food/ Dietary    Client is able to obtain and prepare food independently: Yes  Needs assistance with: None at this time.    Goal: None.    Plan: N/a  None needed       Employment/Activity    Client is currently unemployed.     Client is satisfied with current activity level: Yes    Goal: None reported.     Plan: N/a  None needed       Family and Support System    Client reports the following support system: Family and friends in the area.     Goal: None reported.     Client to N/a      Income / [place] and Amount: SSDI [phone]    Money Management: Client is able to manage funds independently    Goal: None reported.     Plan: N/a  None needed       Legal History/ Concerns   Client is: involved in legal case   "I need to turn myself in. I have a court date on Thursday."    Goal: Squash the warrant to get a new court date.     Plan:  Client to: inform staff of legal status  Case Management Team to: monitor complete reports as needed      Client Identified Strength to achieve recovery goals: "I am resourceful."        Age related, cultural, spiritual, disability needs and goals: N/a      Advance Directives were discussed with client and with the following response and plan:       Electronically signed by:   Daiva Huge, MSW,CDPT  Mental Health Practitioner  [facility]  Phone: [phone]
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
## 2017-06-15 ENCOUNTER — Telehealth (HOSPITAL_BASED_OUTPATIENT_CLINIC_OR_DEPARTMENT_OTHER): Payer: Self-pay | Admitting: Addiction (Substance Use Disorder)

## 2017-06-15 NOTE — Telephone Encounter (Signed)
Left message providing the following information for pt and requested call back to confirm:    The assessment for Sharon Stephens has been reviewed with the following notes:  -Must complete detox or abstain from all drugs/alcohol for a minimum of 72 hours prior to admission AND be able to provide a UA/Breathalyzer negative for all substances immediately upon arrival  -Clients choosing to smoke must provide their tobacco products. Cigarettes must be rolled prior to entering treatment  -Must bring 30 day supply of all prescription medications listed in assessment. Client will not be admitted without their medications.  We have abed date available on 12/20 at 9:30am. Please confirm the client will accept this date or let us know if more time will be needed to coordinate detox services.  Thank you,  Sharon Stephens  Administrative Assistant  [facility]  [facility]  Exceptional service. Every person. Every time.  [address]  [place], [place]  [id number]  P [phone]  Ext [phone]  F [phone] or [phone]

## 2017-06-16 ENCOUNTER — Ambulatory Visit (INDEPENDENT_AMBULATORY_CARE_PROVIDER_SITE_OTHER): Payer: Self-pay

## 2017-06-16 ENCOUNTER — Ambulatory Visit (INDEPENDENT_AMBULATORY_CARE_PROVIDER_SITE_OTHER): Payer: Medicare Other | Admitting: Family Medicine

## 2017-06-16 VITALS — BP 161/88 | HR 84 | Temp 97.1°F | Resp 18 | Wt >= 6400 oz

## 2017-06-16 DIAGNOSIS — Z6841 Body Mass Index (BMI) 40.0 and over, adult: Secondary | ICD-10-CM

## 2017-06-16 DIAGNOSIS — M1711 Unilateral primary osteoarthritis, right knee: Secondary | ICD-10-CM

## 2017-06-16 DIAGNOSIS — J301 Allergic rhinitis due to pollen: Secondary | ICD-10-CM | POA: Insufficient documentation

## 2017-06-16 MED ORDER — MONTELUKAST SODIUM 10 MG OR TABS
10.0000 mg | ORAL_TABLET | Freq: Every evening | ORAL | 0 refills | Status: DC
Start: 2017-06-16 — End: 2017-09-09

## 2017-06-16 MED ORDER — DICLOFENAC SODIUM 1 % TD GEL
2.0000 g | Freq: Four times a day (QID) | TRANSDERMAL | 1 refills | Status: DC | PRN
Start: 2017-06-16 — End: 2018-07-12

## 2017-06-16 NOTE — Progress Notes (Signed)
Chief Complaint   Patient presents with   . URI     Pt here concerns of a cold, will be admitted for inpatient treatment for alcohol and needs to be well.   . Knee Pain     Twisted her right knee yesterday and having pain when walking.      Inpatient rehab 12/20 at [facility].    Have you seen a specialist since your last visit: NO        Pap: will schedule after alcohol treatment    06/15/2017  WAIIS / [facility]scape has been reviewed/entered in Epic: YES  HM DUE : YES (HIV, Pap, MMR)  PHQ: NOT DUE   [facility], [place] Maintenance   Topic Date Due   . HIV Screening  07/03/1993   . Cervical Cancer Screening (Pap Smear)  11/11/2013   . Depression Monitoring (PHQ-9)  04/09/2018   . Tetanus Vaccine  05/05/2019   . Influenza Vaccine  Completed       Future Appointments  Date Time Provider [place]   06/23/2017 10:00 AM Jonathon Jordan, MD [facility]   06/25/2017 2:00 PM Laymond Purser [facility]   06/25/2017 3:00 PM Henin, Amber, MHP [facility]DI[facility]   07/13/2017 1:00 PM Morey Hummingbird, MSW [facility]
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
## 2017-06-16 NOTE — Patient Instructions (Signed)
It was a pleasure seeing you today!    Your knee appears to be sprained.  I have refilled your DICLOFENAC gel.    You also have nasal allergies.    These can cause swelling of the nasal passages resulting in congestion and post nasal drip (and a sore throat).  They can also disrupt the Eustachian Tubes and cause ear pain as a result (the ears drain through the nose).  I have also called in SINGULAIR 10mg  ("Montelukast"), a nightly pill to reduce allergies.  Try this for 1 month, we can extend it if it's helpful.    Please call with any questions and feel better soon!

## 2017-06-23 ENCOUNTER — Encounter (HOSPITAL_BASED_OUTPATIENT_CLINIC_OR_DEPARTMENT_OTHER): Payer: Medicare Other | Admitting: Unknown Physician Specialty

## 2017-06-25 ENCOUNTER — Encounter (HOSPITAL_BASED_OUTPATIENT_CLINIC_OR_DEPARTMENT_OTHER): Payer: Medicare Other | Admitting: Addiction (Substance Use Disorder)

## 2017-06-25 ENCOUNTER — Ambulatory Visit (HOSPITAL_BASED_OUTPATIENT_CLINIC_OR_DEPARTMENT_OTHER): Payer: Medicare Other | Admitting: Counselor

## 2017-07-02 ENCOUNTER — Ambulatory Visit (INDEPENDENT_AMBULATORY_CARE_PROVIDER_SITE_OTHER): Payer: Medicare Other

## 2017-07-03 NOTE — Progress Notes (Signed)
[facility]  1:1 Session Note   Duration 60 min      Description: Client did not show for scheduled appointment.  This writer was alerted that ct has been admitted into inpatient treatment.    Assessment: Insufficient information.    Plan: Engage ct in services post-discharge from inpatient SUD tx.    Gayle Collard, [facility], CDPT

## 2017-07-10 ENCOUNTER — Inpatient Hospital Stay: Payer: Self-pay

## 2017-07-11 ENCOUNTER — Inpatient Hospital Stay: Payer: Self-pay

## 2017-07-12 ENCOUNTER — Encounter (HOSPITAL_BASED_OUTPATIENT_CLINIC_OR_DEPARTMENT_OTHER): Payer: Self-pay | Admitting: Counselor

## 2017-07-12 DIAGNOSIS — R45851 Suicidal ideations: Secondary | ICD-10-CM

## 2017-07-12 NOTE — Progress Notes (Signed)
Evaluated pt in the ER at [facility], wanting to be voluntary admitted to psych hospital.  She was seen in the ER on 07/10/17, discharged and then she came back early am on 07/12/17.  She came in second time intoxicated with alcohol and cocaine.  She was uncooperative per their report, but reports that she had drank about 1/5 of vodka.  I was called in to evaluate Sharon Stephens, Sharon this morning to evaluate her for suicide Ideation with a plan.  She reports to me that she is very depressed and anxious, not taking her medications and has a plan of taking all the meds she has in her belongs if she was to leave here.  She is scared of the thoughts of suicide and states that she is a 7 or 8 out of 10 on the scale of suicide risk.  She was recently discharged from inpt treatment services she felt emotionally unsafe there, but is willing to go back to another facility.  Pt is states that she has no support, issues of isolation and has no real reason to be alive at this time.  She is has been non-compliant of her medications because they give her GI problems.  She had gastric bypass and she isn't able to take the medications, which is another reason she wasn't able to stay in the treatment services.  She is unable to contract safety, and is at high risk.  She wants to be in the hospital and the plan is to voluntary commit her.  She is being screened [facility], unsure of being accepted.

## 2017-07-13 ENCOUNTER — Ambulatory Visit (HOSPITAL_BASED_OUTPATIENT_CLINIC_OR_DEPARTMENT_OTHER): Payer: Medicare Other | Admitting: Counselor

## 2017-07-13 ENCOUNTER — Ambulatory Visit (HOSPITAL_BASED_OUTPATIENT_CLINIC_OR_DEPARTMENT_OTHER): Payer: Medicare Other | Admitting: Clinical

## 2017-07-13 ENCOUNTER — Encounter (HOSPITAL_BASED_OUTPATIENT_CLINIC_OR_DEPARTMENT_OTHER): Payer: Self-pay

## 2017-07-13 ENCOUNTER — Telehealth (HOSPITAL_BASED_OUTPATIENT_CLINIC_OR_DEPARTMENT_OTHER): Payer: Self-pay | Admitting: Counselor

## 2017-07-13 NOTE — Telephone Encounter (Signed)
[facility] Brief Case Management Note            Encounter Location:  [_] In Clinic     [_] PES  [_] Consumer's Residence   [_] Inpatient Psychiatric Facility  [_] Hospital Emergency Room  [_] Jail  [X]  Other: Via phone     Case Management Topics:  [_] Bus Ticket  [_] Hygiene  [_] Laundry  [_] Meal Ticket  [_] Medication  [_] Money  [_] Phone Call  [x]  Schedule/Reschedule Appointment   [_] Other: _    Comment: This Clinical research associatewriter reached out to check in with client after she did not show up for her appointment. Client was unavailable. TW left voice message with name and contact information for client to reschedule.    Plan/Follow Up: TW will reach out to client in 2 weeks.     Sharon Stephens, MSW,CDPT  Mental Health Practitioner  [facility]  Phone: [phone]) [phone]

## 2017-07-15 ENCOUNTER — Telehealth (HOSPITAL_BASED_OUTPATIENT_CLINIC_OR_DEPARTMENT_OTHER): Payer: Self-pay | Admitting: Counselor

## 2017-07-15 NOTE — Telephone Encounter (Signed)
[facility] Brief Case Management Note            Encounter Location:  [_] In Clinic     [_] PES  [_] Consumer's Residence   [_] Inpatient Psychiatric Facility  [_] Hospital Emergency Room  [_] Jail  [X]  Other: Via phone     Case Management Topics:  [_] Bus Ticket  [_] Hygiene  [_] Laundry  [_] Meal Ticket  [_] Medication  [_] Money  [_] Phone Call  [  ] Schedule/Reschedule Appointment   [X]  Other: RE: Crisis Clinic After Hours Response     Comment: This Clinical research associatewriter reached out to [facility] to learn client whereabouts. TW spoke with a Child psychotherapistsocial worker who said client had been discharged on 07/12/16. SWer stated that client was not transferred to another location/hospital.       Plan/Follow Up: TW will reach out to client via phone.     Sharon Stephens, MSW,CDPT  Mental Health Practitioner  [facility]  Phone: [phone]

## 2017-07-15 NOTE — Telephone Encounter (Signed)
[facility] Brief Case Management Note            Encounter Location:  [_] In Clinic     [_] PES  [_] Consumer's Residence   [_] Inpatient Psychiatric Facility  [_] Hospital Emergency Room  [_] Jail  [X]  Other: Via phone     Case Management Topics:  [_] Bus Ticket  [_] Hygiene  [_] Laundry  [_] Meal Ticket  [_] Medication  [_] Money  [_] Phone Call  [x]  Schedule/Reschedule Appointment   [_] Other: Discharge Planning     Comment: This Clinical research associatewriter received a voice message from Vernona RiegerLaura, a Child psychotherapistsocial worker at [facility]. TW returned call and learned that client was actually admitted [facility]. Client will likely be discharged on 07/17/17. TW scheduled follow [facility]. SWer will inform client of future appointments.       Plan/Follow Up:   Future appointments:   CM 07/23/17  MD 07/28/17      Wandra Arthursamaris Kinston Magnan, MSW,CDPT  Mental Health Practitioner  [facility]  Phone: [phone]

## 2017-07-17 ENCOUNTER — Emergency Department (HOSPITAL_BASED_OUTPATIENT_CLINIC_OR_DEPARTMENT_OTHER)
Admission: EM | Admit: 2017-07-17 | Discharge: 2017-07-17 | Disposition: A | Discharge status: Home / Self Care | Payer: Medicare Other | Attending: Emergency Medicine | Admitting: Emergency Medicine

## 2017-07-17 ENCOUNTER — Inpatient Hospital Stay: Payer: Self-pay

## 2017-07-17 ENCOUNTER — Telehealth: Payer: Self-pay

## 2017-07-17 DIAGNOSIS — R200 Anesthesia of skin: Secondary | ICD-10-CM

## 2017-07-17 NOTE — Telephone Encounter (Signed)
TCM    Inpatient records have not been authorized for release.    Carolann LittlerJane Katiejo Gilroy, RN

## 2017-07-17 NOTE — Progress Notes (Addendum)
TCM note    Patient record reviewed for TCM follow up post inpatient discharge.  Patient has not authorized a release of her medical records from the recent hospital stay.  TCM outreach deferred.    Carolann LittlerJane Evetta Renner, RN    Patient has scheduled Discharge follow up on 07/23/2017 and 07/28/2017.    Carolann LittlerJane Peggy Monk, RN

## 2017-07-19 ENCOUNTER — Encounter (HOSPITAL_BASED_OUTPATIENT_CLINIC_OR_DEPARTMENT_OTHER): Payer: Self-pay | Admitting: Addiction (Substance Use Disorder)

## 2017-07-19 DIAGNOSIS — R45851 Suicidal ideations: Secondary | ICD-10-CM

## 2017-07-20 ENCOUNTER — Emergency Department (HOSPITAL_BASED_OUTPATIENT_CLINIC_OR_DEPARTMENT_OTHER)
Admission: EM | Admit: 2017-07-20 | Discharge: 2017-07-20 | Payer: Medicare Other | Attending: Emergency Medicine | Admitting: Emergency Medicine

## 2017-07-20 ENCOUNTER — Telehealth (HOSPITAL_BASED_OUTPATIENT_CLINIC_OR_DEPARTMENT_OTHER): Payer: Self-pay | Admitting: Addiction (Substance Use Disorder)

## 2017-07-20 DIAGNOSIS — Z590 Homelessness: Secondary | ICD-10-CM | POA: Insufficient documentation

## 2017-07-20 DIAGNOSIS — F10129 Alcohol abuse with intoxication, unspecified: Secondary | ICD-10-CM | POA: Insufficient documentation

## 2017-07-20 NOTE — Telephone Encounter (Signed)
(  TEXTING IS AN OPTION FOR [facility] CLINICS ONLY)  Is this a [facility] clinic? No      RETURN CALL: Detailed message on voicemail only      SUBJECT:  Appointment Request     REASON FOR REQUEST/SYMPTOMS: trying to get to a different treatment center   REFERRING PROVIDER: n/a  REQUEST APPOINTMENT WITH: Amber Heinn   REQUESTED DATE: 07/23/17, TIME: 11:15 or later   UNABLE TO APPOINT BECAUSE: [facility] unable to schedule appts for clinic

## 2017-07-20 NOTE — Telephone Encounter (Signed)
Will forward to Amber H.  Please read message below thank you.

## 2017-07-21 NOTE — Progress Notes (Signed)
At 5:15am on 1/12, this Clinical research associatewriter was contacted by [facility] to inform this Clinical research associatewriter pt was at [facility] expressing passive SI and that ER SW Ines BloomerBurr was requesting outreach. This Clinical research associatewriter contacted SW [place] and spoke with pt. Pt indicated desire to reconnect with care providers and uncertainty about housing. This Clinical research associatewriter established plan for pt to be [facility] and [facility].    At 8 pm on 1/13, this writer was again contacted by [facility], stating SW Boneta LucksJenny was requesting outreach for this pt at [facility]. This Clinical research associatewriter contacted [facility] and SW Boneta LucksJenny stated pt had ongoing SI and was still on the list to [facility], but doctors wanted to d/c and could not hold pt any longer. SW Boneta LucksJenny requesting eval for voluntary admission.    This Clinical research associatewriter went to [facility] to evaluate pt and arrived at approximately 8:35 pm. Upon evaluating pt, pt reported not having SI any longer and wanting to be discharged so she could [facility] and get on a bus to go to sleep. This writer expressed concerns about pt discharge due to reported SI. Pt again denied SI and told this writer she wanted to leave and "get the hell out of here". This Clinical research associatewriter asked pt if she would pick up meds, pt reported active prescriptions she could pick up, this Clinical research associatewriter confirmed with ER doc that he would release pt with medications previously prescribed by pt PCP. This Clinical research associatewriter informed pt of appointments at [facility] and left ER without writing affidavit, given pt continuously denied SI upon evaluation from this Clinical research associatewriter.     Sharon Stephens, LICSWA, CDP

## 2017-07-23 ENCOUNTER — Encounter (HOSPITAL_BASED_OUTPATIENT_CLINIC_OR_DEPARTMENT_OTHER): Payer: Medicare Other | Admitting: Counselor

## 2017-07-23 ENCOUNTER — Telehealth (HOSPITAL_BASED_OUTPATIENT_CLINIC_OR_DEPARTMENT_OTHER): Payer: Self-pay | Admitting: Counselor

## 2017-07-23 ENCOUNTER — Encounter (HOSPITAL_BASED_OUTPATIENT_CLINIC_OR_DEPARTMENT_OTHER): Payer: Self-pay

## 2017-07-23 NOTE — Telephone Encounter (Signed)
[facility] Brief Case Management Note            Encounter Location:  [_] In Clinic     [_] PES  [_] Consumer’s Residence   [_] Inpatient Psychiatric Facility  [_] Hospital Emergency Room  [_] Jail  [X] Other: Via phone     Case Management Topics:  [_] Bus Ticket  [_] Hygiene  [_] Laundry  [_] Meal Ticket  [_] Medication  [_] Money  [_] Phone Call  [x] Schedule/Reschedule Appointment   [_] Other: _    Comment: This writer reached out to check in with client and reschedule missed appointment. Client was unavailable. TW left voice message with name and contact information for client to reschedule.    Plan/Follow Up: TW will reach out to client in 2 weeks.     Hassel Uphoff, MSW,CDPT  Mental Health Practitioner  [facility]  Phone: [phone]

## 2017-07-24 ENCOUNTER — Inpatient Hospital Stay: Payer: Self-pay

## 2017-07-28 ENCOUNTER — Encounter (HOSPITAL_BASED_OUTPATIENT_CLINIC_OR_DEPARTMENT_OTHER): Payer: Self-pay

## 2017-07-28 ENCOUNTER — Encounter (HOSPITAL_BASED_OUTPATIENT_CLINIC_OR_DEPARTMENT_OTHER): Payer: Medicare Other | Admitting: Pediatric Medicine

## 2017-07-29 ENCOUNTER — Emergency Department (HOSPITAL_BASED_OUTPATIENT_CLINIC_OR_DEPARTMENT_OTHER)
Admission: EM | Admit: 2017-07-29 | Discharge: 2017-07-30 | Disposition: A | Discharge status: Home / Self Care | Payer: No Typology Code available for payment source | Attending: Emergency Medicine | Admitting: Emergency Medicine

## 2017-07-29 DIAGNOSIS — F10129 Alcohol abuse with intoxication, unspecified: Secondary | ICD-10-CM | POA: Insufficient documentation

## 2017-07-29 DIAGNOSIS — Y909 Presence of alcohol in blood, level not specified: Secondary | ICD-10-CM | POA: Insufficient documentation

## 2017-07-29 DIAGNOSIS — R45851 Suicidal ideations: Secondary | ICD-10-CM | POA: Insufficient documentation

## 2017-07-29 DIAGNOSIS — Z0441 Encounter for examination and observation following alleged adult rape: Secondary | ICD-10-CM | POA: Insufficient documentation

## 2017-07-29 DIAGNOSIS — Z590 Homelessness: Secondary | ICD-10-CM | POA: Insufficient documentation

## 2017-07-30 DIAGNOSIS — F1020 Alcohol dependence, uncomplicated: Secondary | ICD-10-CM

## 2017-07-30 DIAGNOSIS — E669 Obesity, unspecified: Secondary | ICD-10-CM

## 2017-07-30 DIAGNOSIS — Z590 Homelessness: Secondary | ICD-10-CM

## 2017-07-30 DIAGNOSIS — S42123A Displaced fracture of acromial process, unspecified shoulder, initial encounter for closed fracture: Secondary | ICD-10-CM

## 2017-07-30 LAB — URINALYSIS WITH REFLEX CULTURE
Bilirubin (Qual), URN: NEGATIVE
Cast_Hyaline, URN: <1 /LPF — AB
Epith Cells_Renal/Trans,URN: NEGATIVE /HPF
Glucose Qual, URN: NEGATIVE mg/dL
Ketones, URN: NEGATIVE mg/dL
Leukocyte Esterase, URN: NEGATIVE
Nitrite, URN: NEGATIVE
Occult Blood, URN: NEGATIVE
RBC, URN: NEGATIVE /HPF
Specific Gravity, URN: 1.022 g/mL (ref 1.006–1.027)
WBC, URN: NEGATIVE /HPF
pH, URN: 5.5 (ref 5.0–8.0)

## 2017-07-31 ENCOUNTER — Other Ambulatory Visit: Payer: Self-pay | Admitting: Hospitalist

## 2017-07-31 ENCOUNTER — Emergency Department
Admission: EM | Admit: 2017-07-31 | Discharge: 2017-07-31 | Disposition: A | Discharge status: Home / Self Care | Payer: Medicare Other | Attending: Emergency Medicine | Admitting: Emergency Medicine

## 2017-07-31 DIAGNOSIS — Z6841 Body Mass Index (BMI) 40.0 and over, adult: Secondary | ICD-10-CM | POA: Insufficient documentation

## 2017-07-31 DIAGNOSIS — E669 Obesity, unspecified: Secondary | ICD-10-CM | POA: Insufficient documentation

## 2017-07-31 DIAGNOSIS — F1020 Alcohol dependence, uncomplicated: Secondary | ICD-10-CM | POA: Insufficient documentation

## 2017-07-31 DIAGNOSIS — S4992XA Unspecified injury of left shoulder and upper arm, initial encounter: Secondary | ICD-10-CM | POA: Insufficient documentation

## 2017-07-31 DIAGNOSIS — S42123A Displaced fracture of acromial process, unspecified shoulder, initial encounter for closed fracture: Secondary | ICD-10-CM | POA: Insufficient documentation

## 2017-07-31 DIAGNOSIS — W109XXA Fall (on) (from) unspecified stairs and steps, initial encounter: Secondary | ICD-10-CM | POA: Insufficient documentation

## 2017-07-31 DIAGNOSIS — Z590 Homelessness: Secondary | ICD-10-CM | POA: Insufficient documentation

## 2017-07-31 DIAGNOSIS — I10 Essential (primary) hypertension: Secondary | ICD-10-CM | POA: Insufficient documentation

## 2017-08-01 DIAGNOSIS — Z590 Homelessness: Secondary | ICD-10-CM

## 2017-08-01 DIAGNOSIS — M549 Dorsalgia, unspecified: Secondary | ICD-10-CM

## 2017-08-01 DIAGNOSIS — M25552 Pain in left hip: Secondary | ICD-10-CM

## 2017-08-02 ENCOUNTER — Emergency Department
Admission: EM | Admit: 2017-08-02 | Discharge: 2017-08-02 | Disposition: A | Discharge status: Home / Self Care | Payer: Medicare Other | Attending: Emergency Medicine | Admitting: Emergency Medicine

## 2017-08-02 DIAGNOSIS — M25552 Pain in left hip: Secondary | ICD-10-CM | POA: Insufficient documentation

## 2017-08-02 DIAGNOSIS — Z590 Homelessness: Secondary | ICD-10-CM | POA: Insufficient documentation

## 2017-08-02 DIAGNOSIS — M549 Dorsalgia, unspecified: Secondary | ICD-10-CM | POA: Insufficient documentation

## 2017-08-06 ENCOUNTER — Inpatient Hospital Stay: Payer: Self-pay

## 2017-08-07 ENCOUNTER — Telehealth (HOSPITAL_BASED_OUTPATIENT_CLINIC_OR_DEPARTMENT_OTHER): Payer: Self-pay | Admitting: Counselor

## 2017-08-07 ENCOUNTER — Inpatient Hospital Stay: Payer: Self-pay
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
## 2017-08-14 ENCOUNTER — Ambulatory Visit (HOSPITAL_BASED_OUTPATIENT_CLINIC_OR_DEPARTMENT_OTHER): Payer: Medicare Other

## 2017-08-17 ENCOUNTER — Telehealth (HOSPITAL_BASED_OUTPATIENT_CLINIC_OR_DEPARTMENT_OTHER): Payer: Self-pay | Admitting: Counselor

## 2017-08-17 NOTE — Telephone Encounter (Signed)
[facility] Brief Case Management Note            Encounter Location:  [_] In Clinic     [_] PES  [_] Consumer's Residence   [_] Inpatient Psychiatric Facility  [_] Hospital Emergency Room  [_] Jail  [X]  Other: Via phone     Case Management Topics:  [_] Bus Ticket  [_] Hygiene  [_] Laundry  [_] Meal Ticket  [_] Medication  [_] Money  [_] Phone Call  [x]  Schedule/Reschedule Appointment   [_] Other: _    Comment: This Clinical research associatewriter reached out to check in with client and learn if she is still interested [facility]. Client was unavailable. TW left VM letting client know we would begin the termination process if we do not hear from her by 09/03/17. TW left name and contact information for client to return call.     Plan/Follow Up:   TW is unable to contact client via mail due to homeless status.  Continue with termination process 09/04/17.     Sharon Stephens, MSW,CDPT  Mental Health Practitioner  [facility]  Phone: [phone]) [phone]

## 2017-08-19 ENCOUNTER — Ambulatory Visit (HOSPITAL_BASED_OUTPATIENT_CLINIC_OR_DEPARTMENT_OTHER): Payer: Medicare Other | Admitting: Nursing

## 2017-08-19 NOTE — Progress Notes (Signed)
Pt presents to clinic with 1 week of URI symptoms, now c/o ear congestion. As we have no provider in clinic this afternoon, pt is scheduled to be seen tomorrow. Pt agrees with this plan.

## 2017-08-21 ENCOUNTER — Ambulatory Visit (HOSPITAL_BASED_OUTPATIENT_CLINIC_OR_DEPARTMENT_OTHER): Payer: Medicare Other | Attending: Internal Medicine

## 2017-08-21 ENCOUNTER — Ambulatory Visit (HOSPITAL_BASED_OUTPATIENT_CLINIC_OR_DEPARTMENT_OTHER): Payer: Medicare Other | Admitting: Internal Medicine

## 2017-08-21 ENCOUNTER — Encounter (HOSPITAL_BASED_OUTPATIENT_CLINIC_OR_DEPARTMENT_OTHER): Payer: Self-pay

## 2017-08-21 VITALS — BP 124/74 | HR 84 | Temp 97.2°F | Resp 18

## 2017-08-21 DIAGNOSIS — J069 Acute upper respiratory infection, unspecified: Secondary | ICD-10-CM

## 2017-08-21 DIAGNOSIS — M549 Dorsalgia, unspecified: Secondary | ICD-10-CM | POA: Insufficient documentation

## 2017-08-21 DIAGNOSIS — G8929 Other chronic pain: Secondary | ICD-10-CM

## 2017-08-21 DIAGNOSIS — M5430 Sciatica, unspecified side: Secondary | ICD-10-CM | POA: Insufficient documentation
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
## 2017-08-21 NOTE — Patient Instructions (Signed)
-   Keep doing some light exercise (walking)  - Use ice, ideally twice a day for 15-20 min  - Have your partner do gentle massage when able  - Keep using tylenol and methocarbamol as needed  - Schedule with PT

## 2017-08-21 NOTE — Progress Notes (Signed)
Note in error - see physician encounter.

## 2017-08-21 NOTE — Progress Notes (Signed)
Pt presents to clinic c/o back pain after recent fall and URI sx that started last night. Pt triaged to appt with Shike MD.    Pt states understanding and agrees with plan.

## 2017-08-24 ENCOUNTER — Other Ambulatory Visit: Payer: Self-pay | Admitting: Nurse Practitioner

## 2017-08-24 ENCOUNTER — Emergency Department (HOSPITAL_BASED_OUTPATIENT_CLINIC_OR_DEPARTMENT_OTHER)
Admission: EM | Admit: 2017-08-24 | Discharge: 2017-08-24 | Disposition: A | Discharge status: Home / Self Care | Payer: Medicare Other | Attending: Nurse Practitioner | Admitting: Nurse Practitioner

## 2017-08-24 DIAGNOSIS — J101 Influenza due to other identified influenza virus with other respiratory manifestations: Secondary | ICD-10-CM | POA: Insufficient documentation

## 2017-08-24 DIAGNOSIS — J45909 Unspecified asthma, uncomplicated: Secondary | ICD-10-CM | POA: Insufficient documentation

## 2017-08-24 DIAGNOSIS — R062 Wheezing: Secondary | ICD-10-CM

## 2017-08-24 DIAGNOSIS — R05 Cough: Secondary | ICD-10-CM

## 2017-08-24 DIAGNOSIS — Z590 Homelessness: Secondary | ICD-10-CM

## 2017-08-24 DIAGNOSIS — I10 Essential (primary) hypertension: Secondary | ICD-10-CM

## 2017-08-24 LAB — INFLUENZA A, B AND RSV, RAPID PCR
Rapid Influenza A PCR Result: POSITIVE — AB
Rapid Influenza B PCR Result: NEGATIVE
Rapid RSV PCR Result: NEGATIVE

## 2017-08-31 ENCOUNTER — Other Ambulatory Visit: Payer: Self-pay | Admitting: Emergency Medicine

## 2017-08-31 ENCOUNTER — Emergency Department (HOSPITAL_BASED_OUTPATIENT_CLINIC_OR_DEPARTMENT_OTHER)
Admission: EM | Admit: 2017-08-31 | Discharge: 2017-08-31 | Disposition: A | Discharge status: Home / Self Care | Payer: Medicare Other | Attending: Emergency Medicine | Admitting: Emergency Medicine

## 2017-08-31 DIAGNOSIS — R0600 Dyspnea, unspecified: Secondary | ICD-10-CM | POA: Insufficient documentation

## 2017-08-31 DIAGNOSIS — J111 Influenza due to unidentified influenza virus with other respiratory manifestations: Secondary | ICD-10-CM | POA: Insufficient documentation

## 2017-08-31 LAB — GROUP A STREP, RAPID WITH REFLEX TO CULTURE: GpA Beta Strep Rapid Detection: NEGATIVE

## 2017-09-02 LAB — R/O BETA STREP CULTURE

## 2017-09-03 ENCOUNTER — Encounter (HOSPITAL_BASED_OUTPATIENT_CLINIC_OR_DEPARTMENT_OTHER): Payer: Self-pay | Admitting: Nurse Practitioner

## 2017-09-03 ENCOUNTER — Ambulatory Visit (HOSPITAL_BASED_OUTPATIENT_CLINIC_OR_DEPARTMENT_OTHER): Payer: Medicare Other | Attending: Nurse Practitioner | Admitting: Nurse Practitioner

## 2017-09-03 VITALS — BP 136/78 | HR 105 | Temp 97.6°F | Resp 18

## 2017-09-03 DIAGNOSIS — J40 Bronchitis, not specified as acute or chronic: Secondary | ICD-10-CM | POA: Insufficient documentation

## 2017-09-03 DIAGNOSIS — J069 Acute upper respiratory infection, unspecified: Secondary | ICD-10-CM

## 2017-09-03 DIAGNOSIS — J320 Chronic maxillary sinusitis: Secondary | ICD-10-CM

## 2017-09-03 MED ORDER — AZITHROMYCIN 250 MG OR TABS
ORAL_TABLET | ORAL | 0 refills | Status: DC
Start: 2017-09-03 — End: 2017-09-09

## 2017-09-03 MED ORDER — DEXTROMETHORPHAN-GUAIFENESIN 10-100 MG/5ML OR LIQD
5.0000 mL | ORAL | 0 refills | Status: DC | PRN
Start: 2017-09-03 — End: 2017-09-29

## 2017-09-03 MED ORDER — PREDNISONE 20 MG OR TABS
40.0000 mg | ORAL_TABLET | Freq: Every day | ORAL | 0 refills | Status: DC
Start: 2017-09-03 — End: 2017-09-09

## 2017-09-03 MED ORDER — FLUTICASONE PROPIONATE 50 MCG/ACT NA SUSP
2.0000 | Freq: Every day | NASAL | 4 refills | Status: DC
Start: 2017-09-03 — End: 2018-07-12
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
## 2017-09-04 ENCOUNTER — Ambulatory Visit (HOSPITAL_BASED_OUTPATIENT_CLINIC_OR_DEPARTMENT_OTHER): Payer: Medicare Other | Admitting: Nurse Practitioner

## 2017-09-04 ENCOUNTER — Inpatient Hospital Stay: Payer: Self-pay

## 2017-09-04 NOTE — Progress Notes (Signed)
[facility]-Outpatient Progress Note    09/04/2017    Reason for Visit:  Chief Complaint   Patient presents with   . Cough     Subjective: client presents to nurse c/c fever, chills, cough with greenish sputum, mylagias, nasal congestion, sinus pain and ST  Reported she was seen in ER several days ago diagnosed with flu but not feeling any better. Can's sleep at night due to the coughing  She has been taking tylenol (2) 325 mg tablet every 6 hrs without relief of HA    Denied N/V/D, SOB, DOE, CP or other healthy symptoms at this time    Site Where Served: [place]    [facility]    Living Arrangements: homeless[facility]    Pt's PCP:  Pcp, [facility]    Vitals:  There were no vitals taken for this visit.    Patient idenitfier: name, date of birth   General: initial encounter with Sharon Stephens, 42 yo identified female   Eyes: N/A   Ears, Nose, Mouth, Throat: (+) nasal congestion   Cardiovascular: appears adequately perfused, skin warm and dry   Respiratory: normal respiratory effort, speaking in full sentences, RRR non prod spont couhg   Gastrointestinal: no incontinence   Genitourinary: no incontinence   Musculoskeletal: amb indep, steady gait   Skin: color/turgor WNL   Neurological: alert and oriented   Psychiatric: mood and affect appropriate  Chart/appts reviewed    A: Client seen today for cough and care coordination    Referrals: yes    Medications provided per protocol: N/A    Plan: Client triaged to TAC for [place] appt this morning    FU pending with PCP for case consultation and care coordination      Electronically signed:  Juanito Stephens. Sharon Bjorn, RN

## 2017-09-09 ENCOUNTER — Ambulatory Visit (HOSPITAL_BASED_OUTPATIENT_CLINIC_OR_DEPARTMENT_OTHER): Payer: Medicare Other

## 2017-09-09 ENCOUNTER — Encounter (HOSPITAL_BASED_OUTPATIENT_CLINIC_OR_DEPARTMENT_OTHER): Payer: Self-pay | Admitting: Internal Medicine

## 2017-09-09 ENCOUNTER — Telehealth (HOSPITAL_BASED_OUTPATIENT_CLINIC_OR_DEPARTMENT_OTHER): Payer: Self-pay | Admitting: Addiction (Substance Use Disorder)

## 2017-09-09 ENCOUNTER — Ambulatory Visit (HOSPITAL_BASED_OUTPATIENT_CLINIC_OR_DEPARTMENT_OTHER): Payer: Medicare Other | Attending: Internal Medicine | Admitting: Internal Medicine

## 2017-09-09 VITALS — BP 116/78 | HR 90 | Temp 99.1°F | Resp 22 | Ht 66.0 in

## 2017-09-09 DIAGNOSIS — R000 Tachycardia, unspecified: Secondary | ICD-10-CM | POA: Insufficient documentation

## 2017-09-09 DIAGNOSIS — Z7189 Other specified counseling: Secondary | ICD-10-CM

## 2017-09-09 DIAGNOSIS — M25512 Pain in left shoulder: Secondary | ICD-10-CM

## 2017-09-09 DIAGNOSIS — Z6841 Body Mass Index (BMI) 40.0 and over, adult: Secondary | ICD-10-CM

## 2017-09-09 NOTE — Telephone Encounter (Signed)
Left message to see if pt is interested in continuing to [facility].  Provided this writer's number and explained that if we do not hear from her in 10 days, her case will be closed.    Sharon Stephens

## 2017-09-09 NOTE — Progress Notes (Signed)
DATE: 09/09/2017     CHIEF COMPLAINT/IDENTIFICATION: Walk in - neck and shoulder pain    Interpreter: An interpreter was not needed for the visit.        INTERVAL HISTORY:  Sharon Stephens is a 42 year old female with homelessness, HTN, HTN who presents to clinic for walk in assessment.       #.  Shoulder and Neck pain:  Going on for about 24 hours.  Located around chest, shoulders, and back.  Describes pain as constant throbbing.  Worse with any movement.  Taking tylenol and icing/heat - 325mg  every 4 hours.  Tylenol helping some.  Has not had this issue in the past.  Does not know of any inciting factor.  +diaphoresis.      During exam noted to be tachycardic to 200.  On further history patient reports SVT 3 times over last several years.  She has needed IV medication to convert in the past.  In clinic we attempted valsava x 3 and carotid massage x 2 prior to calling medics.  Medics arrived patient spontaneously converted to sinus HR 98.  After resolution of tachycardia, reports her shoulder and neck pain improved.      On chart review, she had a stress TTE in 04/09/10 to assess for inducible ischemia after complaints of exertional chest pain - results revealed no inducible ischemia or wall motion abnormalities.  Recent SVT at [facility] thought to be due to ETOH withdrawal.  Patient denies recent ETOH use.  TSH in the past normal (2013).       HOME MEDICATIONS:  Current Outpatient Medications   Medication Sig Dispense Refill   . Albuterol Sulfate HFA 108 (90 Base) MCG/ACT Inhalation Aero Soln Inhale 2 puffs by mouth every 6 hours as needed for shortness of breath/wheezing. For wheezing and/or cough. 1 Inhaler 5   . dextromethorphan-guaiFENesin 10-100 MG/5ML Oral Liquid Take 5 mL by mouth every 4 hours as needed for cough. For cough. 120 mL 0   . Diclofenac Sodium 1 % Transdermal Gel Apply 2 g to affected area on knee(s) 4 times a day as needed (pain). 60 g 1   . Fluticasone Propionate 50 MCG/ACT Nasal Suspension Spray 2  sprays into each nostril daily. 1 Inhaler 4   . Levonorgestrel 20 MCG/24HR Intrauterine IUD 1 Intra Uterine Device by Intrauterine route One time.     . Methocarbamol 500 MG Oral Tab TAKE 2 TABLETS(1000 MG) BY MOUTH FOUR TIMES DAILY AS NEEDED FOR MUSCLE SPASMS 60 tablet 0   . Multiple Vitamin (MULTI VITAMIN MENS OR) Once daily     . TraZODone HCl 50 MG Oral Tab Take 1 tablet (50 mg) by mouth at bedtime as needed for sleep. For Insomnia. 90 tablet 3     No current facility-administered medications for this visit.        ALLERGIES:  Review of patient's allergies indicates:  Allergies   Allergen Reactions   . Amoxicillin      Other reaction(s): Undetermined  Unsure from childhood    . Gabapentin      Has made her feel sick to her stomach the times she has tried taking this since bariatric surgery    . Ibuprofen      'I can't take because of my gastric bypass surgery'    . Penicillin G    . Penicillins      Other reaction(s): Undetermined  Unsure from childhood          PHYSICAL  EXAM:  BP 116/78   Pulse 90   Temp 99.1 F (37.3 C)   Resp 22   Ht 5\' 6"  (1.676 m)   SpO2 98%   BMI 66.50 kg/m   Wt Readings from Last 5 Encounters:   06/16/17 (!) 412 lb (186.9 kg)   06/02/17 (!) 408 lb (185.1 kg)   03/04/17 (!) 406 lb (184.2 kg)   01/14/17 (!) 420 lb (190.5 kg)   01/06/17 (!) 414 lb (187.8 kg)     BP Readings from Last 5 Encounters:   09/09/17 116/78   09/03/17 136/78   08/21/17 124/74   06/16/17 161/88   06/02/17 139/86     Gen: pleasant, NAD, appropriate  HEENT: anicteric sclera, MMM  CV: tachycardic, unable to appreciate murmur, decreased radial pulses, no LE edema  Lungs: in no respiratory distress, CTAB  Neuro: Alert, moving all extremities, gait stable, narrow-based      ASSESSMENT AND PLAN:  #.  Tachycardia 200s:  Likely SVT.  Spontaneously resolved.  Shoulder/chest/back pain resolved.      - REFERRAL TO ECHOCARDIOGRAM  - REFERRAL TO CARDIAC MONITORING  - XTRNL ECG CONTINUOUS RHYTHM W/I&R UP TO 48 HRS; Future  -  Comprehensive Metabolic Panel  - Magnesium  - CBC  - Thyroid Stimulating Hormone    #.  Follow up: 1 week    Total time of approximately 45 minutes was spent face-to-face with the patient, of which more than 50% was spent counseling and coordinating care.

## 2017-09-10 ENCOUNTER — Telehealth (HOSPITAL_BASED_OUTPATIENT_CLINIC_OR_DEPARTMENT_OTHER): Payer: Self-pay | Admitting: Internal Medicine

## 2017-09-10 ENCOUNTER — Ambulatory Visit (HOSPITAL_BASED_OUTPATIENT_CLINIC_OR_DEPARTMENT_OTHER): Payer: Medicare Other | Attending: Internal Medicine

## 2017-09-10 DIAGNOSIS — R000 Tachycardia, unspecified: Secondary | ICD-10-CM | POA: Insufficient documentation

## 2017-09-10 DIAGNOSIS — Z7189 Other specified counseling: Secondary | ICD-10-CM

## 2017-09-10 LAB — COMPREHENSIVE METABOLIC PANEL
ALT (GPT): 14 U/L (ref 7–33)
AST (GOT): 13 U/L (ref 9–38)
Albumin: 3.6 g/dL (ref 3.5–5.2)
Alkaline Phosphatase (Total): 80 U/L (ref 25–112)
Anion Gap: 9 (ref 4–12)
Bilirubin (Total): 0.3 mg/dL (ref 0.2–1.3)
Calcium: 8.5 mg/dL — ABNORMAL LOW (ref 8.9–10.2)
Carbon Dioxide, Total: 26 meq/L (ref 22–32)
Chloride: 100 meq/L (ref 98–108)
Creatinine: 0.75 mg/dL (ref 0.38–1.02)
GFR, Calc, African American: >60 mL/min/{1.73_m2} (ref >59)
GFR, Calc, European American: >60 mL/min/{1.73_m2} (ref >59)
Glucose: 137 mg/dL — ABNORMAL HIGH (ref 62–125)
Potassium: 4.3 meq/L (ref 3.6–5.2)
Protein (Total): 6.2 g/dL (ref 6.0–8.2)
Sodium: 135 meq/L (ref 135–145)
Urea Nitrogen: 14 mg/dL (ref 8–21)

## 2017-09-10 LAB — CBC (HEMOGRAM)
Hematocrit: 39 % (ref 36–45)
Hemoglobin: 12.3 g/dL (ref 11.5–15.5)
MCH: 28.4 pg (ref 27.3–33.6)
MCHC: 31.5 g/dL — ABNORMAL LOW (ref 32.2–36.5)
MCV: 90 fL (ref 81–98)
Platelet Count: 339 10*3/uL (ref 150–400)
RBC: 4.33 10*6/uL (ref 3.80–5.00)
RDW-CV: 14.6 % — ABNORMAL HIGH (ref 11.6–14.4)
WBC: 12.94 10*3/uL — ABNORMAL HIGH (ref 4.30–10.00)

## 2017-09-10 LAB — THYROID STIMULATING HORMONE: Thyroid Stimulating Hormone: 3.339 u[IU]/mL (ref 0.400–5.000)

## 2017-09-10 LAB — MAGNESIUM: Magnesium: 1.9 mg/dL (ref 1.8–2.4)

## 2017-09-10 NOTE — Progress Notes (Signed)
[facility]-Outpatient Progress Note    09/09/2017    Reason for Visit:  Chief Complaint   Patient presents with   . Care Coordination   . Musculoskeletal Problem     Subjective: client presents to nurse c/c: shoulder pain x 24 hrs. Reported she slept the wrong way in bed last night. She has been taking tylenol OTC 325 mg tablets about every 4 hours without relief; and icy/hot patches.     Denied SOB, DOE, chest/back/ neck pain or tenderness, any recent/ history of injury or trauma    [facility]: [facility]    Where Slept Last Night: [facility]    Living Arrangements: homeless    Pt's PCP:  Pcp, Outside    Vitals:  There were no vitals taken for this visit.    Patient idenitfier: name, date of birth   General: obese, groomed, dressed in casual street clothes   Eyes: glasses   Ears, Nose, Mouth, Throat: N/A   Cardiovascular: appears adequately perfused, skin warm and dry   Respiratory: normal respiratory effort, speaking in full sentences,RRR   Gastrointestinal: no incontinence   Genitourinary: no incontinence   Musculoskeletal: amb indep, steady gait   Skin: color/turgor WNL   Neurological: alert and oriented   Psychiatric: mood and affect appropriate    A: Client seen today for care coordination    Referrals: yes    Medications provided per protocol: N/A    Plan: [facility] for [place] appt at 1 PM this afternoon    ! PM client accompanied to TAC for appt as scheduled    Pending FU with PCP for case consultation and care coordination.    Electronically signed:  Juanito Doom. Earlisha Sharples, RN

## 2017-09-10 NOTE — Telephone Encounter (Signed)
I spoke with patient and she will walk over the [address] today to schedule RN lab draw.  If patient comes in to clinic, please schedule her for lab draw appointment prior to next week Wednesday.  Labs are ordered.

## 2017-09-10 NOTE — Progress Notes (Signed)
Pt presents to clinic for blood draw. Labs drawn per order -- pt tolerated well. Pt coordinating getting set up with halter monitor after episode of tachycardia.    Pt states understanding and agrees with plan.

## 2017-09-11 ENCOUNTER — Ambulatory Visit (HOSPITAL_BASED_OUTPATIENT_CLINIC_OR_DEPARTMENT_OTHER): Payer: Medicare Other | Attending: Internal Medicine

## 2017-09-11 DIAGNOSIS — R000 Tachycardia, unspecified: Secondary | ICD-10-CM

## 2017-09-12 ENCOUNTER — Emergency Department
Admission: EM | Admit: 2017-09-12 | Discharge: 2017-09-13 | Disposition: A | Discharge status: Home / Self Care | Payer: Medicare Other | Attending: Emergency Medicine | Admitting: Emergency Medicine

## 2017-09-12 DIAGNOSIS — G43909 Migraine, unspecified, not intractable, without status migrainosus: Secondary | ICD-10-CM | POA: Insufficient documentation

## 2017-09-12 DIAGNOSIS — Z79899 Other long term (current) drug therapy: Secondary | ICD-10-CM | POA: Insufficient documentation

## 2017-09-12 DIAGNOSIS — F10129 Alcohol abuse with intoxication, unspecified: Secondary | ICD-10-CM | POA: Insufficient documentation

## 2017-09-12 DIAGNOSIS — F329 Major depressive disorder, single episode, unspecified: Secondary | ICD-10-CM

## 2017-09-12 DIAGNOSIS — I10 Essential (primary) hypertension: Secondary | ICD-10-CM | POA: Insufficient documentation

## 2017-09-12 DIAGNOSIS — E119 Type 2 diabetes mellitus without complications: Secondary | ICD-10-CM | POA: Insufficient documentation

## 2017-09-12 DIAGNOSIS — Z880 Allergy status to penicillin: Secondary | ICD-10-CM | POA: Insufficient documentation

## 2017-09-12 DIAGNOSIS — Y906 Blood alcohol level of 120-199 mg/100 ml: Secondary | ICD-10-CM | POA: Insufficient documentation

## 2017-09-12 LAB — CBC, DIFF
% Basophils: 0 %
% Eosinophils: 1 %
% Immature Granulocytes: 1 %
% Lymphocytes: 31 %
% Monocytes: 6 %
% Neutrophils: 61 %
% Nucleated RBC: 0 %
Absolute Eosinophil Count: 0.11 10*3/uL (ref 0.00–0.50)
Absolute Lymphocyte Count: 2.91 10*3/uL (ref 1.00–4.80)
Basophils: 0.03 10*3/uL (ref 0.00–0.20)
Hematocrit: 41 % (ref 36–45)
Hemoglobin: 12.5 g/dL (ref 11.5–15.5)
Immature Granulocytes: 0.08 10*3/uL — ABNORMAL HIGH (ref 0.00–0.05)
MCH: 28.2 pg (ref 27.3–33.6)
MCHC: 30.6 g/dL — ABNORMAL LOW (ref 32.2–36.5)
MCV: 92 fL (ref 81–98)
Monocytes: 0.61 10*3/uL (ref 0.00–0.80)
Neutrophils: 5.81 10*3/uL (ref 1.80–7.00)
Nucleated RBC: 0 10*3/uL
Platelet Count: 289 10*3/uL (ref 150–400)
RBC: 4.44 10*6/uL (ref 3.80–5.00)
RDW-CV: 15 % — ABNORMAL HIGH (ref 11.6–14.4)
WBC: 9.55 10*3/uL (ref 4.3–10.0)

## 2017-09-12 LAB — URINALYSIS WITH REFLEX CULTURE
Bacteria, URN: NONE SEEN
Bilirubin (Qual), URN: NEGATIVE
Epith Cells_Renal/Trans,URN: NEGATIVE /HPF
Epith Cells_Squamous, URN: NEGATIVE /LPF
Glucose Qual, URN: NEGATIVE mg/dL
Ketones, URN: NEGATIVE mg/dL
Leukocyte Esterase, URN: NEGATIVE
Nitrite, URN: NEGATIVE
Occult Blood, URN: NEGATIVE
Protein (Alb Semiquant), URN: NEGATIVE mg/dL
RBC, URN: NEGATIVE /HPF
Specific Gravity, URN: <1.006 g/mL — ABNORMAL LOW (ref 1.006–1.027)
WBC, URN: NEGATIVE /HPF
pH, URN: 6 (ref 5.0–8.0)

## 2017-09-12 LAB — STANDARD DRUG SCREEN, URN
Acetaminophen Qualitative, URN: NEGATIVE
Alcohol (Ethyl), URN: 191 mg/dL — AB
Amphet/Methamphetamine Qual,URN: NEGATIVE
Barbiturate (Qual), URN: NEGATIVE
Benzodiazepines (Qual), URN: NEGATIVE
Cannabinoids (Qual), URN: NEGATIVE
Cocaine (Qual), URN: NEGATIVE
Methadone (Qual), URN: NEGATIVE
Opiates (Qual), URN: NEGATIVE
Phencyclidine (Qual), URN: NEGATIVE
Tricyclic Antidepressants, URN: NEGATIVE

## 2017-09-12 LAB — COMPREHENSIVE METABOLIC PANEL
ALT (GPT): 57 U/L — ABNORMAL HIGH (ref 7–33)
AST (GOT): 133 U/L — ABNORMAL HIGH (ref 9–38)
Albumin: 3.8 g/dL (ref 3.5–5.2)
Alkaline Phosphatase (Total): 132 U/L — ABNORMAL HIGH (ref 25–112)
Anion Gap: 12 (ref 4–12)
Bilirubin (Total): 0.5 mg/dL (ref 0.2–1.3)
Calcium: 8.6 mg/dL — ABNORMAL LOW (ref 8.9–10.2)
Carbon Dioxide, Total: 25 meq/L (ref 22–32)
Chloride: 97 meq/L — ABNORMAL LOW (ref 98–108)
Creatinine: 0.7 mg/dL (ref 0.38–1.02)
GFR, Calc, African American: >60 mL/min/{1.73_m2} (ref >59)
GFR, Calc, European American: >60 mL/min/{1.73_m2} (ref >59)
Glucose: 122 mg/dL (ref 62–125)
Potassium: 4.7 meq/L (ref 3.6–5.2)
Protein (Total): 6.8 g/dL (ref 6.0–8.2)
Sodium: 134 meq/L — ABNORMAL LOW (ref 135–145)
Urea Nitrogen: 8 mg/dL (ref 8–21)

## 2017-09-12 LAB — ALCOHOL (ETHYL): Alcohol (Ethyl): 166 mg/dL — AB

## 2017-09-12 LAB — PREGNANCY TEST, URINE
Pregnancy Test, URN: NEGATIVE
Specific Gravity, URN: <1.006 g/mL — ABNORMAL LOW (ref 1.006–1.027)

## 2017-09-16 ENCOUNTER — Ambulatory Visit (HOSPITAL_BASED_OUTPATIENT_CLINIC_OR_DEPARTMENT_OTHER): Payer: Medicare Other | Attending: Internal Medicine | Admitting: Internal Medicine

## 2017-09-16 VITALS — BP 118/78 | HR 78 | Temp 98.0°F | Ht 66.0 in | Wt >= 6400 oz

## 2017-09-16 DIAGNOSIS — B9689 Other specified bacterial agents as the cause of diseases classified elsewhere: Secondary | ICD-10-CM | POA: Insufficient documentation

## 2017-09-16 DIAGNOSIS — Z5900 Homelessness unspecified: Secondary | ICD-10-CM

## 2017-09-16 DIAGNOSIS — Z6841 Body Mass Index (BMI) 40.0 and over, adult: Secondary | ICD-10-CM

## 2017-09-16 DIAGNOSIS — J019 Acute sinusitis, unspecified: Secondary | ICD-10-CM | POA: Insufficient documentation

## 2017-09-16 MED ORDER — CEFIXIME 400 MG OR CAPS
400.0000 mg | ORAL_CAPSULE | Freq: Every day | ORAL | 0 refills | Status: DC
Start: 2017-09-16 — End: 2017-09-18

## 2017-09-16 NOTE — Progress Notes (Signed)
DATE: 09/16/2017     CHIEF COMPLAINT/IDENTIFICATION: Walk in - sinus pain    Interpreter: An interpreter was not needed for the visit.        INTERVAL HISTORY:  Sharon Stephens is a 42 year old female with ETOH use, polysubstance use who presents to clinic for walk in assessment.       #.  Sinus pain:  + Sinus pressure.  + drainage.  Denies fever and chills. + headache and upper tooth pain.  Taking tylenol every 500mg  every 4-6 hours with some relief.  A couple of weeks of symptoms.    #.  Tachycardia:  Denies palpitations since last visit.       HOME MEDICATIONS:  Current Outpatient Medications   Medication Sig Dispense Refill   . Albuterol Sulfate HFA 108 (90 Base) MCG/ACT Inhalation Aero Soln Inhale 2 puffs by mouth every 6 hours as needed for shortness of breath/wheezing. For wheezing and/or cough. 1 Inhaler 5   . dextromethorphan-guaiFENesin 10-100 MG/5ML Oral Liquid Take 5 mL by mouth every 4 hours as needed for cough. For cough. 120 mL 0   . Diclofenac Sodium 1 % Transdermal Gel Apply 2 g to affected area on knee(s) 4 times a day as needed (pain). 60 g 1   . Fluticasone Propionate 50 MCG/ACT Nasal Suspension Spray 2 sprays into each nostril daily. 1 Inhaler 4   . Levonorgestrel 20 MCG/24HR Intrauterine IUD 1 Intra Uterine Device by Intrauterine route One time.     . Methocarbamol 500 MG Oral Tab TAKE 2 TABLETS(1000 MG) BY MOUTH FOUR TIMES DAILY AS NEEDED FOR MUSCLE SPASMS 60 tablet 0   . Multiple Vitamin (MULTI VITAMIN MENS OR) Once daily     . TraZODone HCl 50 MG Oral Tab Take 1 tablet (50 mg) by mouth at bedtime as needed for sleep. For Insomnia. 90 tablet 3     No current facility-administered medications for this visit.        ALLERGIES:  Review of patient's allergies indicates:  Allergies   Allergen Reactions   . Amoxicillin      Other reaction(s): Undetermined  Unsure from childhood    . Gabapentin      Has made her feel sick to her stomach the times she has tried taking this since bariatric surgery     . Ibuprofen      'I can't take because of my gastric bypass surgery'    . Penicillin G    . Penicillins      Other reaction(s): Undetermined  Unsure from childhood          PHYSICAL EXAM:  There were no vitals taken for this visit.  Wt Readings from Last 5 Encounters:   06/16/17 (!) 412 lb (186.9 kg)   06/02/17 (!) 408 lb (185.1 kg)   03/04/17 (!) 406 lb (184.2 kg)   01/14/17 (!) 420 lb (190.5 kg)   01/06/17 (!) 414 lb (187.8 kg)     BP Readings from Last 5 Encounters:   09/09/17 116/78   09/03/17 136/78   08/21/17 124/74   06/16/17 161/88   06/02/17 139/86     Gen: pleasant, NAD, appropriate  HEENT: anicteric sclera, MMM, maxillary and frontal sinus tender to palpation  CV: RRR, normal S1/S2, no murmurs  Lungs: in no respiratory distress, CTAB  Neuro: Alert, moving all extremities, gait stable, narrow-based      ASSESSMENT AND PLAN:  #. Acute bacterial sinusitis:  Pressure and congestion ongoing for >  14 days.  Will treat for ABS.  Has PCN allergy and uncertain if she tolerated doxycycline in the past (thinks she had gastric pain).  - Cefixime 400 MG Oral Cap; Take 1 capsule (400 mg) by mouth daily for 7 days.  Dispense: 7 capsule; Refill: 0    #.  Tachycardia likely SVT:  Awaiting holter and TTE results.     #.  Transaminitis in setting of ETOH use (ED visit 3/9):  Repeat LFTs with next labs.    #.  Follow up:   Has appointment with new PCP on 3/26

## 2017-09-18 ENCOUNTER — Emergency Department
Admission: EM | Admit: 2017-09-18 | Discharge: 2017-09-19 | Disposition: A | Discharge status: Home / Self Care | Payer: Medicare Other | Attending: Emergency Medicine | Admitting: Emergency Medicine

## 2017-09-18 DIAGNOSIS — Z79899 Other long term (current) drug therapy: Secondary | ICD-10-CM | POA: Insufficient documentation

## 2017-09-18 DIAGNOSIS — F10129 Alcohol abuse with intoxication, unspecified: Secondary | ICD-10-CM

## 2017-09-18 DIAGNOSIS — I10 Essential (primary) hypertension: Secondary | ICD-10-CM | POA: Insufficient documentation

## 2017-09-18 DIAGNOSIS — Z880 Allergy status to penicillin: Secondary | ICD-10-CM | POA: Insufficient documentation

## 2017-09-18 DIAGNOSIS — E119 Type 2 diabetes mellitus without complications: Secondary | ICD-10-CM | POA: Insufficient documentation

## 2017-09-18 DIAGNOSIS — Y906 Blood alcohol level of 120-199 mg/100 ml: Secondary | ICD-10-CM | POA: Insufficient documentation

## 2017-09-18 LAB — URINALYSIS WITH REFLEX CULTURE
Bacteria, URN: NONE SEEN
Bilirubin (Qual), URN: NEGATIVE
Epith Cells_Renal/Trans,URN: NEGATIVE /HPF
Epith Cells_Squamous, URN: NEGATIVE /LPF
Glucose Qual, URN: NEGATIVE mg/dL
Ketones, URN: NEGATIVE mg/dL
Leukocyte Esterase, URN: NEGATIVE
Nitrite, URN: NEGATIVE
Occult Blood, URN: NEGATIVE
Protein (Alb Semiquant), URN: NEGATIVE mg/dL
RBC, URN: NEGATIVE /HPF
Specific Gravity, URN: <1.006 g/mL — ABNORMAL LOW (ref 1.006–1.027)
WBC, URN: NEGATIVE /HPF
pH, URN: 6 (ref 5.0–8.0)

## 2017-09-18 LAB — COMPREHENSIVE METABOLIC PANEL
ALT (GPT): 12 U/L (ref 7–33)
AST (GOT): 21 U/L (ref 9–38)
Albumin: 3.6 g/dL (ref 3.5–5.2)
Alkaline Phosphatase (Total): 76 U/L (ref 25–112)
Anion Gap: 12 (ref 4–12)
Bilirubin (Total): 0.4 mg/dL (ref 0.2–1.3)
Calcium: 8.6 mg/dL — ABNORMAL LOW (ref 8.9–10.2)
Carbon Dioxide, Total: 19 meq/L — ABNORMAL LOW (ref 22–32)
Chloride: 102 meq/L (ref 98–108)
Creatinine: 0.69 mg/dL (ref 0.38–1.02)
GFR, Calc, African American: >60 mL/min/{1.73_m2} (ref >59)
GFR, Calc, European American: >60 mL/min/{1.73_m2} (ref >59)
Glucose: 98 mg/dL (ref 62–125)
Potassium: 4.6 meq/L (ref 3.6–5.2)
Protein (Total): 7 g/dL (ref 6.0–8.2)
Sodium: 133 meq/L — ABNORMAL LOW (ref 135–145)
Urea Nitrogen: 11 mg/dL (ref 8–21)

## 2017-09-18 LAB — CBC, DIFF
% Basophils: 0 %
% Eosinophils: 1 %
% Immature Granulocytes: 1 %
% Lymphocytes: 26 %
% Monocytes: 6 %
% Neutrophils: 66 %
% Nucleated RBC: 0 %
Absolute Eosinophil Count: 0.08 10*3/uL (ref 0.00–0.50)
Absolute Lymphocyte Count: 3.15 10*3/uL (ref 1.00–4.80)
Basophils: 0.05 10*3/uL (ref 0.00–0.20)
Hematocrit: 43 % (ref 36–45)
Hemoglobin: 12.4 g/dL (ref 11.5–15.5)
Immature Granulocytes: 0.06 10*3/uL — ABNORMAL HIGH (ref 0.00–0.05)
MCH: 28.2 pg (ref 27.3–33.6)
MCHC: 29 g/dL — ABNORMAL LOW (ref 32.2–36.5)
MCV: 97 fL (ref 81–98)
Monocytes: 0.72 10*3/uL (ref 0.00–0.80)
Neutrophils: 8.08 10*3/uL — ABNORMAL HIGH (ref 1.80–7.00)
Nucleated RBC: 0 10*3/uL
Platelet Count: 234 10*3/uL (ref 150–400)
RBC: 4.4 10*6/uL (ref 3.80–5.00)
RDW-CV: 15.3 % — ABNORMAL HIGH (ref 11.6–14.4)
WBC: 12.14 10*3/uL — ABNORMAL HIGH (ref 4.3–10.0)

## 2017-09-18 LAB — STANDARD DRUG SCREEN, URN
Acetaminophen Qualitative, URN: NEGATIVE
Alcohol (Ethyl), URN: 253 mg/dL — AB
Amphet/Methamphetamine Qual,URN: NEGATIVE
Barbiturate (Qual), URN: NEGATIVE
Benzodiazepines (Qual), URN: NEGATIVE
Cannabinoids (Qual), URN: NEGATIVE
Cocaine (Qual), URN: NEGATIVE
Methadone (Qual), URN: NEGATIVE
Opiates (Qual), URN: NEGATIVE
Phencyclidine (Qual), URN: NEGATIVE
Tricyclic Antidepressants, URN: NEGATIVE

## 2017-09-18 LAB — ALCOHOL (ETHYL): Alcohol (Ethyl): 198 mg/dL — AB

## 2017-09-18 MED ORDER — CEFPODOXIME PROXETIL 200 MG OR TABS
200.0000 mg | ORAL_TABLET | Freq: Two times a day (BID) | ORAL | 0 refills | Status: AC
Start: 2017-09-18 — End: 2017-09-25

## 2017-09-18 MED ORDER — CEFPODOXIME PROXETIL 200 MG OR TABS
200.0000 mg | ORAL_TABLET | Freq: Two times a day (BID) | ORAL | 0 refills | Status: DC
Start: 2017-09-18 — End: 2017-09-18

## 2017-09-18 NOTE — Addendum Note (Signed)
Addended by: Fabian SharpANIGUCHI, Brownie Gockel MASANO on: 09/18/2017 11:07 AM     Modules accepted: Orders

## 2017-09-21 NOTE — Progress Notes (Signed)
[facility]-Outpatient Progress Note    09/10/2017    Reason for Visit:  Chief Complaint   Patient presents with   . Care Coordination     Subjective: client presents to nurse for follow up Reported her symptoms were related to a a "heart attack."  The doctor is planning to arrange for her to wear a heart monitor. She is not sure if [facility] will allow this or not.    Denied CP, HA, dizziness, neck/jaw/back/shoulder  pain, nausea, sweats, abdominal discomfort/heartburn, heart racing or any other health symptoms concerns at this time.    Site Where Served: [facility]    Where Slept Last Night: [facility]    Living Arrangements: homeless    Pt's PCP:  Pcp, Outside    Vitals:  There were no vitals taken for this visit.    Patient idenitfier: name, date of birth   General: obese, groomed, dressed in casual street clothes   Eyes: conjunctiva WNL   Ears, Nose, Mouth, Throat: MMM, hears in normal tone of voice   Cardiovascular: appears adequately perfused   Respiratory: normal respiratory effort, speaking in full sentences   Gastrointestinal: no incontinence   Genitourinary: no incontinence   Musculoskeletal: amb indep, steady gait, pace slow   Skin: color/turgor WNL   Neurological: alert and oriented   Psychiatric: mood ana affect appropriate  Chart/appts reviewed    A: Client seen today for care coordination    Referrals: yes    Medications provided per protocol: N/A    Plan: FU [facility] to facilitate scheduling RN visit for blood work, orders pending  Appt available at 1:30 PM this afternoon Client agreed to accept plan.    [facility]. Encouraged client to return call to continue services Client  agreed to consider plan    Client confirmed she received telephone message and has the telephone number to return call.    Client referred to shelter program coordinator to address concerns about the heart  Monitor Client agreed to plan    Electronically signed:  Juanito Doom. Caliope Ruppert, RN

## 2017-09-23 ENCOUNTER — Encounter (HOSPITAL_BASED_OUTPATIENT_CLINIC_OR_DEPARTMENT_OTHER): Payer: Medicare Other

## 2017-09-23 DIAGNOSIS — R000 Tachycardia, unspecified: Secondary | ICD-10-CM

## 2017-09-29 ENCOUNTER — Ambulatory Visit (HOSPITAL_BASED_OUTPATIENT_CLINIC_OR_DEPARTMENT_OTHER): Payer: Medicare Other | Attending: Psychiatry | Admitting: Psychiatry

## 2017-09-29 ENCOUNTER — Encounter (HOSPITAL_BASED_OUTPATIENT_CLINIC_OR_DEPARTMENT_OTHER): Payer: Self-pay | Admitting: Psychiatry

## 2017-09-29 VITALS — BP 118/74 | HR 88 | Temp 98.6°F | Resp 24 | Ht 66.0 in

## 2017-09-29 DIAGNOSIS — H938X3 Other specified disorders of ear, bilateral: Secondary | ICD-10-CM | POA: Insufficient documentation

## 2017-09-29 DIAGNOSIS — R05 Cough: Secondary | ICD-10-CM | POA: Insufficient documentation

## 2017-09-29 DIAGNOSIS — R059 Cough, unspecified: Secondary | ICD-10-CM

## 2017-09-29 DIAGNOSIS — Z6841 Body Mass Index (BMI) 40.0 and over, adult: Secondary | ICD-10-CM

## 2017-09-29 DIAGNOSIS — Z9884 Bariatric surgery status: Secondary | ICD-10-CM | POA: Insufficient documentation

## 2017-09-29 DIAGNOSIS — R000 Tachycardia, unspecified: Secondary | ICD-10-CM | POA: Insufficient documentation

## 2017-09-29 MED ORDER — LORATADINE 10 MG OR TABS
10.0000 mg | ORAL_TABLET | Freq: Every day | ORAL | 0 refills | Status: DC
Start: 2017-09-29 — End: 2017-10-20

## 2017-09-29 MED ORDER — OXYMETAZOLINE HCL 0.05 % NA SOLN
2.0000 | Freq: Two times a day (BID) | NASAL | 0 refills | Status: DC
Start: 2017-09-29 — End: 2017-10-20

## 2017-09-29 MED ORDER — ALBUTEROL SULFATE HFA 108 (90 BASE) MCG/ACT IN AERS
2.0000 | INHALATION_SPRAY | Freq: Four times a day (QID) | RESPIRATORY_TRACT | 5 refills | Status: DC | PRN
Start: 2017-09-29 — End: 2018-12-02

## 2017-09-29 MED ORDER — MULTI VITAMIN MENS OR TABS
1.0000 | ORAL_TABLET | Freq: Every day | ORAL | 4 refills | Status: DC
Start: 2017-09-29 — End: 2018-05-12
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
## 2017-09-30 ENCOUNTER — Inpatient Hospital Stay: Payer: Self-pay

## 2017-10-01 ENCOUNTER — Other Ambulatory Visit (HOSPITAL_BASED_OUTPATIENT_CLINIC_OR_DEPARTMENT_OTHER): Payer: Medicare Other

## 2017-10-04 ENCOUNTER — Inpatient Hospital Stay: Payer: Self-pay

## 2017-10-05 ENCOUNTER — Ambulatory Visit (HOSPITAL_BASED_OUTPATIENT_CLINIC_OR_DEPARTMENT_OTHER): Payer: Medicare HMO | Admitting: Nurse Practitioner

## 2017-10-05 ENCOUNTER — Encounter (HOSPITAL_BASED_OUTPATIENT_CLINIC_OR_DEPARTMENT_OTHER): Payer: Self-pay | Admitting: Counselor

## 2017-10-05 ENCOUNTER — Encounter (HOSPITAL_BASED_OUTPATIENT_CLINIC_OR_DEPARTMENT_OTHER): Payer: Self-pay | Admitting: Nurse Practitioner

## 2017-10-05 ENCOUNTER — Emergency Department (HOSPITAL_BASED_OUTPATIENT_CLINIC_OR_DEPARTMENT_OTHER)
Admission: EM | Admit: 2017-10-05 | Discharge: 2017-10-05 | Disposition: A | Discharge status: Home / Self Care | Payer: Medicare HMO | Attending: Family | Admitting: Family

## 2017-10-05 VITALS — HR 98 | Resp 18

## 2017-10-05 VITALS — BP 104/70 | HR 104 | Temp 99.0°F | Resp 28 | Ht 66.0 in

## 2017-10-05 DIAGNOSIS — Z6841 Body Mass Index (BMI) 40.0 and over, adult: Secondary | ICD-10-CM

## 2017-10-05 DIAGNOSIS — H9209 Otalgia, unspecified ear: Secondary | ICD-10-CM

## 2017-10-05 DIAGNOSIS — I10 Essential (primary) hypertension: Secondary | ICD-10-CM | POA: Insufficient documentation

## 2017-10-05 DIAGNOSIS — E119 Type 2 diabetes mellitus without complications: Secondary | ICD-10-CM | POA: Insufficient documentation

## 2017-10-05 DIAGNOSIS — Z79899 Other long term (current) drug therapy: Secondary | ICD-10-CM | POA: Insufficient documentation

## 2017-10-05 DIAGNOSIS — Z7189 Other specified counseling: Secondary | ICD-10-CM

## 2017-10-05 DIAGNOSIS — H6691 Otitis media, unspecified, right ear: Secondary | ICD-10-CM

## 2017-10-05 DIAGNOSIS — Z590 Homelessness: Secondary | ICD-10-CM | POA: Insufficient documentation

## 2017-10-05 MED ORDER — CHLORPHENIRAMINE MALEATE 4 MG OR TABS
4.0000 mg | ORAL_TABLET | Freq: Four times a day (QID) | ORAL | 0 refills | Status: DC | PRN
Start: 2017-10-05 — End: 2017-10-20

## 2017-10-05 MED ORDER — ACETAMINOPHEN 325 MG OR TABS
325.0000 mg | ORAL_TABLET | Freq: Four times a day (QID) | ORAL | 3 refills | Status: DC | PRN
Start: 2017-10-05 — End: 2020-09-21

## 2017-10-05 MED ORDER — OFLOXACIN 0.3 % OT SOLN
5.0000 [drp] | Freq: Two times a day (BID) | OTIC | 0 refills | Status: DC
Start: 2017-10-05 — End: 2017-10-20

## 2017-10-05 NOTE — Progress Notes (Signed)
[facility] BEHAVIOR[facility] MEDICINE TERMINATION SUMMARY    Duration of treatment (years): 0              Date of exit: 10/05/17    Client's reason for presenting for treatment:   Retrieved from client's [facility] intake 03/31/18  "I need help with housing.  I need help quitting alcohol.  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  Alcohol use creates severe problems impacting housing, vocational, and legal stability.  She had SI while intoxicated this past week.     Course of treatment:   Retrieved from client's [facility] intake 03/31/18  PLAN: _  Sharon Stephens will engage in both Addictions and [facility].  [facility] for [facility] behavioral healthcare.  Pt will visit new case manager Dayma to develop and refine care plan.  Pt will visit  psychiatric provider Dr Gibson RampFeldman for an initial assessment & medication review.  [facility] for assessment and brief treatment.    Known medication sensitivities/allergies: Amoxicillin; Gabapentin; Ibuprofen; Penicillin g; and Penicillins     Reason for termination: Whereabouts unknown.     Condition at discharge and current needs assessment:  Last documented encounter on 07/19/17. Crisis connections requested outreach to [facility].   At 5:15am on 1/12, this Clinical research associatewriter was contacted by [facility] to inform this Clinical research associatewriter pt [facility] expressing passive SI and that ER SW Ines BloomerBurr was requesting outreach. This Clinical research associatewriter contacted [facility] and spoke with pt. Pt indicated desire to reconnect with care providers and uncertainty about housing. This Clinical research associatewriter established plan for pt to be sent to [facility] and [facility] appointments.     At 8 pm on 1/13, this  writer was again contacted by [facility], stating SW Boneta LucksJenny was requesting outreach for this pt at [facility]. This Clinical research associatewriter contacted [facility] and SW Boneta LucksJenny stated pt had ongoing SI and [facility], but doctors wanted to d/c and could not hold pt any longer. SW Boneta LucksJenny requesting eval for voluntary admission.     This Clinical research associatewriter went to [facility] [facility] to evaluate pt and arrived at approximately 8:35 pm. Upon evaluating pt, pt reported not having SI any longer and wanting to be discharged so [facility] could [facility] and get on a bus to go to sleep. This writer expressed concerns about pt discharge due to reported SI. Pt again denied SI and told this writer she wanted to leave and "get the hell out of here". This Clinical research associatewriter asked pt if she would pick up meds, pt reported active prescriptions she could pick up, this Clinical research associatewriter confirmed with ER doc that he would release pt with medications previously prescribed by pt PCP. This Clinical research associatewriter [facility] and left ER without writing affidavit, given pt continuously denied SI upon evaluation from this Clinical research associatewriter.   Nicholes CalamityGarrett Hebel, LICSWA, CDP    Attempts to engage client if treatment is being discontinued AMA: N/a    Transferred/discharged to: Unknown  Spoke with:  on (date)   and exchanged the following clinical information:      Diagnosis at termination:   F33.2  Major depressive disorder, recurrent severe without psychotic features    Type X next to appropriate choice:  Is [facility] the payee for this client?  []  Yes (contact Payee Fiscal Specialist)  [X]  No        Does client reside in [facility] sponsored housing?  []  Yes Research scientist (life sciences))  [X]  No    Considerations if client returns for treatment:  Poor investment in treatment    Further comments about client returning to treatment:     Recommendations for future treatment:  Chemical dependency treatment  Supported housing/residential placement  Employment Services    Please  forward this document to your supervisor for co-signature.      Wandra Arthurs, MSW  Mental Health Practitioner  [facility]  Phone: [phone]

## 2017-10-05 NOTE — Progress Notes (Signed)
[facility]-Outpatient Progress Note    10/05/2017    Reason for Visit:  Chief Complaint   Patient presents with   . Care Coordination   . Ear Pain     Subjective: client presents to nurse c/o severe ear pain Reported she was [facility] for an [facility].  She started a Z pak yesterday, but pain has not improved. Requesting assistance to schedule an appt at the clinic next door.    Denied dizziness, HA, ring in ears, falls or any health symptoms/concerns at this time.    Site Where Served: [facility]    [place] [place]    Living Arrangements: homeless    Pt's PCP:  Penni BombardEnoch, Lindsey Margaret, MD    Vitals:  Pulse 98   Resp 18   SpO2 98%     Patient idenitfier: name, date of birth   General: appears non toxic groomed, dressed in casual street clothes   Eyes: glasses   Ears, Nose, Mouth, Throat: MMM, hears in normal tone of voice   Cardiovascular: appears adequately perfused   Respiratory: normal respiratory effort, speaking in full sentences   Gastrointestinal: no incontinence   Genitourinary: no incontinence   Musculoskeletal: amb indep, steady gait   Skin: color/turgor WNL   Neurological: alert and oriented   Psychiatric: mood and affect appropriate for this encounter  Chart/appts reviewed    A: Client seen today for ear pain    Referrals: yes    Medications provided per protocol: N/A    Plan: FY TAC client accepted WI at 8:45 AM this morning    0840 RN outreached client sitting in the dining room eating breakfast Client reported she forgot about appt next door.   Client accompanied to TAC for 8:45 AM WI    RN pending FU with TAC provider.    Electronically signed:  Juanito Doom. Juliette Standre, RN
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
## 2017-10-06 ENCOUNTER — Other Ambulatory Visit (HOSPITAL_BASED_OUTPATIENT_CLINIC_OR_DEPARTMENT_OTHER): Payer: Self-pay | Admitting: Nurse Practitioner

## 2017-10-06 ENCOUNTER — Encounter (HOSPITAL_BASED_OUTPATIENT_CLINIC_OR_DEPARTMENT_OTHER): Payer: Self-pay | Admitting: Addiction (Substance Use Disorder)

## 2017-10-06 ENCOUNTER — Ambulatory Visit (HOSPITAL_BASED_OUTPATIENT_CLINIC_OR_DEPARTMENT_OTHER): Payer: Medicare HMO

## 2017-10-06 DIAGNOSIS — J40 Bronchitis, not specified as acute or chronic: Secondary | ICD-10-CM

## 2017-10-06 MED ORDER — PREDNISONE 10 MG OR TABS
10.0000 mg | ORAL_TABLET | Freq: Every day | ORAL | 0 refills | Status: DC
Start: 2017-10-06 — End: 2017-10-20

## 2017-10-06 NOTE — Addendum Note (Signed)
Addended by: Shawn StallBOXWELL, Ondine Gemme OWEN on: 10/06/2017 11:07 AM     Modules accepted: Orders

## 2017-10-07 ENCOUNTER — Inpatient Hospital Stay: Payer: Self-pay

## 2017-10-11 ENCOUNTER — Emergency Department (HOSPITAL_BASED_OUTPATIENT_CLINIC_OR_DEPARTMENT_OTHER)
Admission: EM | Admit: 2017-10-11 | Discharge: 2017-10-11 | Disposition: A | Discharge status: Home / Self Care | Payer: Medicare HMO | Attending: Nurse Practitioner | Admitting: Nurse Practitioner

## 2017-10-11 DIAGNOSIS — H6691 Otitis media, unspecified, right ear: Secondary | ICD-10-CM | POA: Insufficient documentation

## 2017-10-16 ENCOUNTER — Emergency Department (HOSPITAL_BASED_OUTPATIENT_CLINIC_OR_DEPARTMENT_OTHER)
Admission: EM | Admit: 2017-10-16 | Discharge: 2017-10-16 | Disposition: A | Discharge status: Home / Self Care | Payer: Medicare HMO | Attending: Emergency Medicine | Admitting: Emergency Medicine

## 2017-10-16 ENCOUNTER — Other Ambulatory Visit: Payer: Self-pay | Admitting: Emergency Medicine

## 2017-10-16 ENCOUNTER — Other Ambulatory Visit: Payer: Self-pay

## 2017-10-16 DIAGNOSIS — H6691 Otitis media, unspecified, right ear: Secondary | ICD-10-CM | POA: Insufficient documentation

## 2017-10-16 DIAGNOSIS — R000 Tachycardia, unspecified: Secondary | ICD-10-CM

## 2017-10-16 DIAGNOSIS — I471 Supraventricular tachycardia: Secondary | ICD-10-CM | POA: Insufficient documentation

## 2017-10-16 DIAGNOSIS — R079 Chest pain, unspecified: Secondary | ICD-10-CM | POA: Insufficient documentation

## 2017-10-16 DIAGNOSIS — F1910 Other psychoactive substance abuse, uncomplicated: Secondary | ICD-10-CM | POA: Insufficient documentation

## 2017-10-16 LAB — CBC, DIFF
% Basophils: 1 %
% Eosinophils: 1 %
% Immature Granulocytes: 0 %
% Lymphocytes: 35 %
% Monocytes: 6 %
% Neutrophils: 57 %
% Nucleated RBC: 0 %
Absolute Eosinophil Count: 0.09 10*3/uL (ref 0.00–0.50)
Absolute Lymphocyte Count: 4.35 10*3/uL (ref 1.00–4.80)
Basophils: 0.08 10*3/uL (ref 0.00–0.20)
Hematocrit: 43 % (ref 36–45)
Hemoglobin: 13.4 g/dL (ref 11.5–15.5)
Immature Granulocytes: 0.04 10*3/uL (ref 0.00–0.05)
MCH: 28.8 pg (ref 27.3–33.6)
MCHC: 31.5 g/dL — ABNORMAL LOW (ref 32.2–36.5)
MCV: 91 fL (ref 81–98)
Monocytes: 0.72 10*3/uL (ref 0.00–0.80)
Neutrophils: 7.3 10*3/uL — ABNORMAL HIGH (ref 1.80–7.00)
Nucleated RBC: 0 10*3/uL
Platelet Count: 385 10*3/uL (ref 150–400)
RBC: 4.65 10*6/uL (ref 3.80–5.00)
RDW-CV: 14.4 % (ref 11.6–14.4)
WBC: 12.58 10*3/uL — ABNORMAL HIGH (ref 4.30–10.00)

## 2017-10-16 LAB — EKG 12 LEAD
Atrial Rate: 107 {beats}/min
Atrial Rate: 214 {beats}/min
P Axis: 53 degrees
P-R Interval: 142 ms
Q-T Interval: 230 ms
Q-T Interval: 346 ms
QRS Duration: 82 ms
QRS Duration: 86 ms
QTC Calculation: 430 ms
QTC Calculation: 461 ms
R Axis: -28 degrees
R Axis: -40 degrees
T Axis: 122 degrees
T Axis: 53 degrees
Ventricular Rate: 107 {beats}/min
Ventricular Rate: 210 {beats}/min

## 2017-10-16 LAB — BASIC METABOLIC PANEL
Anion Gap: 11 (ref 4–12)
Calcium: 8.9 mg/dL (ref 8.9–10.2)
Carbon Dioxide, Total: 22 meq/L (ref 22–32)
Chloride: 103 meq/L (ref 98–108)
Creatinine: 0.81 mg/dL (ref 0.38–1.02)
GFR, Calc, African American: >60 mL/min/{1.73_m2} (ref >59)
GFR, Calc, European American: >60 mL/min/{1.73_m2} (ref >59)
Glucose: 128 mg/dL — ABNORMAL HIGH (ref 62–125)
Potassium: 3.8 meq/L (ref 3.6–5.2)
Sodium: 136 meq/L (ref 135–145)
Urea Nitrogen: 8 mg/dL (ref 8–21)

## 2017-10-16 LAB — B_TYPE NATRIURETIC PEPTIDE: B_Type Natriuretic Peptide: 201 pg/mL — ABNORMAL HIGH (ref <101)

## 2017-10-16 LAB — PROTHROMBIN & PTT
Partial Thromboplastin Time: 28 s (ref 22–35)
Prothrombin INR: 1 (ref 0.8–1.3)
Prothrombin Time Patient: 13.1 s (ref 10.7–15.6)

## 2017-10-16 LAB — TROPONIN_I
Troponin_I Interpretation: NORMAL
Troponin_I: <0.03 ng/mL (ref <0.04)

## 2017-10-20 ENCOUNTER — Encounter (HOSPITAL_BASED_OUTPATIENT_CLINIC_OR_DEPARTMENT_OTHER): Payer: Self-pay | Admitting: Psychiatry

## 2017-10-20 ENCOUNTER — Ambulatory Visit (HOSPITAL_BASED_OUTPATIENT_CLINIC_OR_DEPARTMENT_OTHER): Payer: Medicare HMO | Attending: Psychiatry | Admitting: Psychiatry

## 2017-10-20 ENCOUNTER — Ambulatory Visit: Payer: Medicare HMO

## 2017-10-20 VITALS — BP 100/62 | HR 80 | Temp 98.4°F | Resp 24 | Ht 66.0 in

## 2017-10-20 DIAGNOSIS — R059 Cough, unspecified: Secondary | ICD-10-CM

## 2017-10-20 DIAGNOSIS — H6504 Acute serous otitis media, recurrent, right ear: Secondary | ICD-10-CM | POA: Insufficient documentation

## 2017-10-20 DIAGNOSIS — R739 Hyperglycemia, unspecified: Secondary | ICD-10-CM | POA: Insufficient documentation

## 2017-10-20 DIAGNOSIS — H938X3 Other specified disorders of ear, bilateral: Secondary | ICD-10-CM | POA: Insufficient documentation

## 2017-10-20 DIAGNOSIS — E559 Vitamin D deficiency, unspecified: Secondary | ICD-10-CM | POA: Insufficient documentation

## 2017-10-20 DIAGNOSIS — Z0000 Encounter for general adult medical examination without abnormal findings: Secondary | ICD-10-CM | POA: Insufficient documentation

## 2017-10-20 DIAGNOSIS — E538 Deficiency of other specified B group vitamins: Secondary | ICD-10-CM | POA: Insufficient documentation

## 2017-10-20 DIAGNOSIS — R05 Cough: Secondary | ICD-10-CM | POA: Insufficient documentation

## 2017-10-20 DIAGNOSIS — Z6841 Body Mass Index (BMI) 40.0 and over, adult: Secondary | ICD-10-CM

## 2017-10-20 DIAGNOSIS — Z975 Presence of (intrauterine) contraceptive device: Secondary | ICD-10-CM | POA: Insufficient documentation

## 2017-10-20 DIAGNOSIS — I471 Supraventricular tachycardia: Secondary | ICD-10-CM | POA: Insufficient documentation

## 2017-10-20 LAB — BASIC METABOLIC PANEL
Anion Gap: 7 (ref 4–12)
Calcium: 8.7 mg/dL — ABNORMAL LOW (ref 8.9–10.2)
Carbon Dioxide, Total: 27 meq/L (ref 22–32)
Chloride: 102 meq/L (ref 98–108)
Creatinine: 0.53 mg/dL (ref 0.38–1.02)
GFR, Calc, African American: >60 mL/min/{1.73_m2} (ref >59)
GFR, Calc, European American: >60 mL/min/{1.73_m2} (ref >59)
Glucose: 92 mg/dL (ref 62–125)
Potassium: 4.5 meq/L (ref 3.6–5.2)
Sodium: 136 meq/L (ref 135–145)
Urea Nitrogen: 12 mg/dL (ref 8–21)

## 2017-10-20 LAB — FOLATE & VITAMIN B12
Folate, SRM: 6.2 ng/mL (ref >5.8)
Vitamin B12 (Cobalamin): 149 pg/mL — ABNORMAL LOW (ref 180–914)

## 2017-10-20 LAB — HIV ANTIGEN AND ANTIBODY SCRN
HIV Antigen and Antibody Interpretation: NONREACTIVE
HIV Antigen and Antibody Result: NONREACTIVE

## 2017-10-20 MED ORDER — LEVOFLOXACIN 500 MG OR TABS
500.0000 mg | ORAL_TABLET | ORAL | 0 refills | Status: DC
Start: 2017-10-20 — End: 2018-05-12

## 2017-10-20 MED ORDER — LORATADINE 10 MG OR TABS
10.0000 mg | ORAL_TABLET | Freq: Every day | ORAL | 0 refills | Status: DC
Start: 2017-10-20 — End: 2018-05-12

## 2017-10-20 NOTE — Progress Notes (Signed)
[facility]-Outpatient Progress Note    10/20/2017    Reason for Visit:  Chief Complaint   Patient presents with   . Care Coordination   . Ear Pain     Subjective: client presents to nurse c/c severe right ear pain/pressure, no improvement of symptoms since starting new ATB April 12 prescribed by MD in the ER. Endorsed HA, fever and changes in hearing acuity. Reported history of ear infections as a child. Appt with the ear doctor scheduled last half of May.    Denied nasal congestion, cough, ST, N/A, dizziness, SOB, DOE, CP, change in vision at this time    Site Where Served: [facility]    Where Slept Last Night: [facility]    Living Arrangements: homeless    Pt's PCP:  Penni BombardEnoch, Lindsey Margaret, MD    Vitals:  There were no vitals taken for this visit.    Patient idenitfier: name, date of birth   General: appears non toxic dressed in casual street clothes   Eyes: N/A   Ears, Nose, Mouth, Throat: MMM, unable to assess rinner right ear, external right/left ear appear WNL, no obvious drainage   Cardiovascular: appears adequately perfused   Respiratory: normal respiratory effort, speaking in full sentences   Gastrointestinal: no incontinence   Genitourinary: no incontinence   Musculoskeletal: amb indep, steady gait   Skin: color/turgor WNL   Neurological: alert and oriented   Psychiatric: mood and affect appropriate  Chart/appts reviewed    A: Client seen today for care coordination    Referrals: yes    Medications provided per protocol: N/A    Plan: [facility] at 10:45AM this morning.  Educated using teach back not to place any objects in ears (ie Q tips)    Pending FU with [facility] for care coordination    Electronically signed:  Juanito Doom. Ilhan Madan, RN

## 2017-10-20 NOTE — Addendum Note (Signed)
Addended by: Penni BombardENOCH, Declyn Delsol MARGARET on: 10/20/2017 12:03 PM     Modules accepted: Orders
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
## 2017-10-20 NOTE — Progress Notes (Signed)
Pt's blood drawn and sent to lab per Dr. Enoch's order. Pt tolerated well.

## 2017-10-21 ENCOUNTER — Ambulatory Visit (HOSPITAL_BASED_OUTPATIENT_CLINIC_OR_DEPARTMENT_OTHER): Payer: Medicare HMO | Attending: Cardiovascular Disease

## 2017-10-21 ENCOUNTER — Other Ambulatory Visit (HOSPITAL_COMMUNITY): Payer: Self-pay

## 2017-10-21 DIAGNOSIS — R002 Palpitations: Secondary | ICD-10-CM | POA: Insufficient documentation

## 2017-10-22 LAB — VITAMIN D (25 HYDROXY)
Vit D (25_Hydroxy) Total: 7.4 ng/mL — ABNORMAL LOW (ref 20.1–50.0)
Vitamin D2 (25_Hydroxy): <1 ng/mL
Vitamin D3 (25_Hydroxy): 7.4 ng/mL

## 2017-10-26 MED ORDER — VITAMIN D (ERGOCALCIFEROL) 1.25 MG (50000 UT) OR CAPS
50000.0000 [IU] | ORAL_CAPSULE | ORAL | 0 refills | Status: DC
Start: 2017-10-26 — End: 2018-05-12

## 2017-10-26 MED ORDER — CALCIUM CARBONATE-VITAMIN D 600-400 MG-UNIT OR TABS
1.0000 | ORAL_TABLET | Freq: Two times a day (BID) | ORAL | 2 refills | Status: DC
Start: 2017-11-27 — End: 2018-05-12

## 2017-10-26 NOTE — Addendum Note (Signed)
Addended by: Penni BombardENOCH, Aribella Vavra MARGARET on: 10/26/2017 10:07 AM     Modules accepted: Orders

## 2017-10-27 ENCOUNTER — Emergency Department (HOSPITAL_BASED_OUTPATIENT_CLINIC_OR_DEPARTMENT_OTHER)
Admission: EM | Admit: 2017-10-27 | Discharge: 2017-10-27 | Disposition: A | Discharge status: Home / Self Care | Payer: Medicare HMO | Attending: Nurse Practitioner | Admitting: Nurse Practitioner

## 2017-10-27 DIAGNOSIS — E119 Type 2 diabetes mellitus without complications: Secondary | ICD-10-CM | POA: Insufficient documentation

## 2017-10-27 DIAGNOSIS — G43909 Migraine, unspecified, not intractable, without status migrainosus: Secondary | ICD-10-CM | POA: Insufficient documentation

## 2017-10-27 DIAGNOSIS — Z590 Homelessness: Secondary | ICD-10-CM | POA: Insufficient documentation

## 2017-10-27 DIAGNOSIS — I10 Essential (primary) hypertension: Secondary | ICD-10-CM | POA: Insufficient documentation

## 2017-10-27 LAB — CBC, DIFF
% Basophils: 0 %
% Eosinophils: 1 %
% Immature Granulocytes: 0 %
% Lymphocytes: 24 %
% Monocytes: 6 %
% Neutrophils: 69 %
% Nucleated RBC: 0 %
Absolute Eosinophil Count: 0.04 10*3/uL (ref 0.00–0.50)
Absolute Lymphocyte Count: 1.98 10*3/uL (ref 1.00–4.80)
Basophils: 0.03 10*3/uL (ref 0.00–0.20)
Hematocrit: 45 % (ref 36–45)
Hemoglobin: 14.5 g/dL (ref 11.5–15.5)
Immature Granulocytes: 0.02 10*3/uL (ref 0.00–0.05)
MCH: 28.8 pg (ref 27.3–33.6)
MCHC: 32.4 g/dL (ref 32.2–36.5)
MCV: 89 fL (ref 81–98)
Monocytes: 0.49 10*3/uL (ref 0.00–0.80)
Neutrophils: 5.54 10*3/uL (ref 1.80–7.00)
Nucleated RBC: 0 10*3/uL
Platelet Count: 287 10*3/uL (ref 150–400)
RBC: 5.04 10*6/uL — ABNORMAL HIGH (ref 3.80–5.00)
RDW-CV: 13.7 % (ref 11.6–14.4)
WBC: 8.1 10*3/uL (ref 4.30–10.00)

## 2017-10-27 LAB — COMPREHENSIVE METABOLIC PANEL
ALT (GPT): 22 U/L (ref 7–33)
AST (GOT): 30 U/L (ref 9–38)
Albumin: 4.1 g/dL (ref 3.5–5.2)
Alkaline Phosphatase (Total): 77 U/L (ref 25–112)
Anion Gap: 11 (ref 4–12)
Bilirubin (Total): 0.4 mg/dL (ref 0.2–1.3)
Calcium: 8.9 mg/dL (ref 8.9–10.2)
Carbon Dioxide, Total: 21 meq/L — ABNORMAL LOW (ref 22–32)
Chloride: 102 meq/L (ref 98–108)
Creatinine: 0.59 mg/dL (ref 0.38–1.02)
GFR, Calc, African American: >60 mL/min/{1.73_m2} (ref >59)
GFR, Calc, European American: >60 mL/min/{1.73_m2} (ref >59)
Glucose: 92 mg/dL (ref 62–125)
Potassium: 4 meq/L (ref 3.6–5.2)
Protein (Total): 7.9 g/dL (ref 6.0–8.2)
Sodium: 134 meq/L — ABNORMAL LOW (ref 135–145)
Urea Nitrogen: 13 mg/dL (ref 8–21)

## 2017-10-27 LAB — LIPASE: Lipase: 8 U/L (ref <70)

## 2017-10-27 MED ORDER — CYANOCOBALAMIN 1000 MCG/ML IJ SOLN
100.0000 ug | INTRAMUSCULAR | 5 refills | Status: DC
Start: 2017-10-27 — End: 2017-10-27

## 2017-10-27 MED ORDER — CYANOCOBALAMIN 1000 MCG/ML IJ SOLN
100.0000 ug | INTRAMUSCULAR | 5 refills | Status: DC
Start: 2017-10-27 — End: 2017-11-03

## 2017-10-27 NOTE — Addendum Note (Signed)
Addended by: Penni BombardENOCH, Zi Newbury MARGARET on: 10/27/2017 10:20 AM     Modules accepted: Orders

## 2017-10-28 ENCOUNTER — Telehealth (HOSPITAL_BASED_OUTPATIENT_CLINIC_OR_DEPARTMENT_OTHER): Payer: Self-pay

## 2017-10-28 NOTE — Telephone Encounter (Signed)
(  TEXTING IS AN OPTION FOR [facility] CLINICS ONLY)  Is this a [facility] clinic? No      RETURN CALL: Detailed message on voicemail only      SUBJECT:  Appointment Request     REASON FOR REQUEST/SYMPTOMS: Chest pain patient was [facility] on 10/16/2017 she was told to follow up with clinic within 1-2 weeks.   REFERRING PROVIDER: [facility]  REQUEST APPOINTMENT WITH: Any provider  REQUESTED DATE: Any time ASAp, TIME: Any time  UNABLE TO APPOINT BECAUSE: No available appointments.

## 2017-11-03 ENCOUNTER — Other Ambulatory Visit (HOSPITAL_BASED_OUTPATIENT_CLINIC_OR_DEPARTMENT_OTHER): Payer: Self-pay | Admitting: Psychiatry

## 2017-11-03 DIAGNOSIS — E538 Deficiency of other specified B group vitamins: Secondary | ICD-10-CM

## 2017-11-03 DIAGNOSIS — I471 Supraventricular tachycardia: Secondary | ICD-10-CM

## 2017-11-03 MED ORDER — CYANOCOBALAMIN 1000 MCG/ML IJ SOLN
100.0000 ug | INTRAMUSCULAR | 5 refills | Status: DC
Start: 2017-11-03 — End: 2018-05-12

## 2017-11-04 ENCOUNTER — Other Ambulatory Visit: Payer: Self-pay | Admitting: Emergency Medicine

## 2017-11-04 ENCOUNTER — Emergency Department
Admission: EM | Admit: 2017-11-04 | Discharge: 2017-11-04 | Disposition: A | Discharge status: Home / Self Care | Payer: Medicare HMO | Attending: Emergency Medicine | Admitting: Emergency Medicine

## 2017-11-04 DIAGNOSIS — Z9889 Other specified postprocedural states: Secondary | ICD-10-CM | POA: Insufficient documentation

## 2017-11-04 DIAGNOSIS — W19XXXA Unspecified fall, initial encounter: Secondary | ICD-10-CM | POA: Insufficient documentation

## 2017-11-04 DIAGNOSIS — R109 Unspecified abdominal pain: Secondary | ICD-10-CM | POA: Insufficient documentation

## 2017-11-04 DIAGNOSIS — F418 Other specified anxiety disorders: Secondary | ICD-10-CM | POA: Insufficient documentation

## 2017-11-04 DIAGNOSIS — S3992XA Unspecified injury of lower back, initial encounter: Secondary | ICD-10-CM

## 2017-11-04 DIAGNOSIS — M545 Low back pain: Secondary | ICD-10-CM | POA: Insufficient documentation

## 2017-11-04 DIAGNOSIS — E119 Type 2 diabetes mellitus without complications: Secondary | ICD-10-CM | POA: Insufficient documentation

## 2017-11-04 DIAGNOSIS — I10 Essential (primary) hypertension: Secondary | ICD-10-CM | POA: Insufficient documentation

## 2017-11-04 DIAGNOSIS — M5442 Lumbago with sciatica, left side: Secondary | ICD-10-CM | POA: Insufficient documentation

## 2017-11-04 DIAGNOSIS — S3991XA Unspecified injury of abdomen, initial encounter: Secondary | ICD-10-CM | POA: Insufficient documentation

## 2017-11-04 DIAGNOSIS — Z880 Allergy status to penicillin: Secondary | ICD-10-CM | POA: Insufficient documentation

## 2017-11-04 DIAGNOSIS — Z79899 Other long term (current) drug therapy: Secondary | ICD-10-CM | POA: Insufficient documentation

## 2017-11-04 DIAGNOSIS — S3993XA Unspecified injury of pelvis, initial encounter: Secondary | ICD-10-CM | POA: Insufficient documentation

## 2017-11-04 LAB — STANDARD DRUG SCREEN, URN
Acetaminophen Qualitative, URN: NEGATIVE
Alcohol (Ethyl), URN: 313 mg/dL — AB
Amphet/Methamphetamine Qual,URN: NEGATIVE
Barbiturate (Qual), URN: NEGATIVE
Benzodiazepines (Qual), URN: NEGATIVE
Cannabinoids (Qual), URN: NEGATIVE
Cocaine (Qual), URN: NEGATIVE
Methadone (Qual), URN: NEGATIVE
Opiates (Qual), URN: NEGATIVE
Phencyclidine (Qual), URN: NEGATIVE
Tricyclic Antidepressants, URN: NEGATIVE

## 2017-11-04 LAB — COMPREHENSIVE METABOLIC PANEL
ALT (GPT): 11 U/L (ref 7–33)
AST (GOT): 18 U/L (ref 9–38)
Albumin: 3.8 g/dL (ref 3.5–5.2)
Alkaline Phosphatase (Total): 65 U/L (ref 25–112)
Anion Gap: 6 (ref 4–12)
Bilirubin (Total): 0.3 mg/dL (ref 0.2–1.3)
Calcium: 8.7 mg/dL — ABNORMAL LOW (ref 8.9–10.2)
Carbon Dioxide, Total: 28 meq/L (ref 22–32)
Chloride: 106 meq/L (ref 98–108)
Creatinine: 0.66 mg/dL (ref 0.38–1.02)
GFR, Calc, African American: >60 mL/min/{1.73_m2} (ref >59)
GFR, Calc, European American: >60 mL/min/{1.73_m2} (ref >59)
Glucose: 100 mg/dL (ref 62–125)
Potassium: 3.9 meq/L (ref 3.6–5.2)
Protein (Total): 7.3 g/dL (ref 6.0–8.2)
Sodium: 140 meq/L (ref 135–145)
Urea Nitrogen: 9 mg/dL (ref 8–21)

## 2017-11-04 LAB — URINALYSIS WITH REFLEX CULTURE
Bilirubin (Qual), URN: NEGATIVE
Epith Cells_Renal/Trans,URN: NEGATIVE /HPF
Epith Cells_Squamous, URN: NEGATIVE /LPF
Glucose Qual, URN: NEGATIVE mg/dL
Ketones, URN: NEGATIVE mg/dL
Leukocyte Esterase, URN: NEGATIVE
Nitrite, URN: NEGATIVE
Occult Blood, URN: NEGATIVE
Protein (Alb Semiquant), URN: NEGATIVE mg/dL
RBC, URN: NEGATIVE /HPF
Specific Gravity, URN: <1.006 g/mL — ABNORMAL LOW (ref 1.006–1.027)
WBC, URN: NEGATIVE /HPF
pH, URN: 5.5 (ref 5.0–8.0)

## 2017-11-04 LAB — CBC, DIFF
% Basophils: 1 %
% Eosinophils: 1 %
% Immature Granulocytes: 0 %
% Lymphocytes: 49 %
% Monocytes: 5 %
% Neutrophils: 44 %
% Nucleated RBC: 0 %
Absolute Eosinophil Count: 0.06 10*3/uL (ref 0.00–0.50)
Absolute Lymphocyte Count: 2.88 10*3/uL (ref 1.00–4.80)
Basophils: 0.04 10*3/uL (ref 0.00–0.20)
Hematocrit: 42 % (ref 36–45)
Hemoglobin: 12.9 g/dL (ref 11.5–15.5)
Immature Granulocytes: 0.02 10*3/uL (ref 0.00–0.05)
MCH: 28 pg (ref 27.3–33.6)
MCHC: 30.8 g/dL — ABNORMAL LOW (ref 32.2–36.5)
MCV: 91 fL (ref 81–98)
Monocytes: 0.27 10*3/uL (ref 0.00–0.80)
Neutrophils: 2.52 10*3/uL (ref 1.80–7.00)
Nucleated RBC: 0 10*3/uL
Platelet Count: 302 10*3/uL (ref 150–400)
RBC: 4.6 10*6/uL (ref 3.80–5.00)
RDW-CV: 13.8 % (ref 11.6–14.4)
WBC: 5.79 10*3/uL (ref 4.3–10.0)

## 2017-11-04 LAB — PROTHROMBIN TIME
Prothrombin INR: 0.9 (ref 0.8–1.3)
Prothrombin Time Patient: 12.7 s (ref 10.7–15.6)

## 2017-11-04 LAB — ALCOHOL (ETHYL): Alcohol (Ethyl): 249 mg/dL — AB

## 2017-11-04 LAB — PREGNANCY (HCG), SERUM, QUANT: Pregnancy (HCG), SRM: <1 m[IU]/mL (ref <6)

## 2017-11-04 LAB — TROPONIN_I
Troponin_I Interpretation: NORMAL
Troponin_I: <0.03 ng/mL (ref <0.04)

## 2017-11-05 ENCOUNTER — Inpatient Hospital Stay: Payer: Self-pay

## 2017-11-05 LAB — EKG 12 LEAD
Atrial Rate: 87 {beats}/min
P Axis: 71 degrees
P-R Interval: 150 ms
Q-T Interval: 406 ms
QRS Duration: 96 ms
QTC Calculation: 488 ms
R Axis: -30 degrees
T Axis: 13 degrees
Ventricular Rate: 87 {beats}/min

## 2017-11-06 DIAGNOSIS — M545 Low back pain: Secondary | ICD-10-CM

## 2017-11-06 DIAGNOSIS — Z590 Homelessness: Secondary | ICD-10-CM

## 2017-11-06 DIAGNOSIS — E119 Type 2 diabetes mellitus without complications: Secondary | ICD-10-CM

## 2017-11-06 DIAGNOSIS — I10 Essential (primary) hypertension: Secondary | ICD-10-CM

## 2017-11-06 DIAGNOSIS — Y92811 Bus as the place of occurrence of the external cause: Secondary | ICD-10-CM

## 2017-11-06 DIAGNOSIS — Z79899 Other long term (current) drug therapy: Secondary | ICD-10-CM

## 2017-11-06 DIAGNOSIS — W1830XA Fall on same level, unspecified, initial encounter: Secondary | ICD-10-CM

## 2017-11-07 ENCOUNTER — Encounter (EMERGENCY_DEPARTMENT_HOSPITAL): Payer: Self-pay | Admitting: Emergency Medicine

## 2017-11-07 ENCOUNTER — Encounter: Payer: Self-pay | Admitting: Emergency Medicine

## 2017-11-07 ENCOUNTER — Emergency Department (HOSPITAL_BASED_OUTPATIENT_CLINIC_OR_DEPARTMENT_OTHER)
Admission: EM | Admit: 2017-11-07 | Discharge: 2017-11-07 | Disposition: A | Discharge status: Home / Self Care | Payer: Medicare HMO | Attending: Emergency Medicine | Admitting: Emergency Medicine

## 2017-11-07 DIAGNOSIS — I10 Essential (primary) hypertension: Secondary | ICD-10-CM | POA: Insufficient documentation

## 2017-11-07 DIAGNOSIS — W1830XA Fall on same level, unspecified, initial encounter: Secondary | ICD-10-CM | POA: Insufficient documentation

## 2017-11-07 DIAGNOSIS — Z590 Homelessness: Secondary | ICD-10-CM | POA: Insufficient documentation

## 2017-11-07 DIAGNOSIS — E119 Type 2 diabetes mellitus without complications: Secondary | ICD-10-CM | POA: Insufficient documentation

## 2017-11-07 DIAGNOSIS — Y92811 Bus as the place of occurrence of the external cause: Secondary | ICD-10-CM | POA: Insufficient documentation

## 2017-11-07 DIAGNOSIS — M545 Low back pain: Secondary | ICD-10-CM | POA: Insufficient documentation

## 2017-11-07 DIAGNOSIS — Z79899 Other long term (current) drug therapy: Secondary | ICD-10-CM | POA: Insufficient documentation

## 2017-11-10 ENCOUNTER — Encounter (HOSPITAL_BASED_OUTPATIENT_CLINIC_OR_DEPARTMENT_OTHER): Payer: Medicare HMO | Admitting: Psychiatry

## 2017-11-12 DIAGNOSIS — Z7189 Other specified counseling: Secondary | ICD-10-CM

## 2017-11-12 DIAGNOSIS — Z09 Encounter for follow-up examination after completed treatment for conditions other than malignant neoplasm: Secondary | ICD-10-CM
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
## 2017-11-16 ENCOUNTER — Encounter (HOSPITAL_BASED_OUTPATIENT_CLINIC_OR_DEPARTMENT_OTHER): Payer: Medicare HMO | Admitting: Internal Medicine

## 2017-11-17 ENCOUNTER — Encounter (HOSPITAL_BASED_OUTPATIENT_CLINIC_OR_DEPARTMENT_OTHER): Payer: Medicare HMO | Admitting: Psychiatry

## 2017-11-26 ENCOUNTER — Ambulatory Visit (HOSPITAL_BASED_OUTPATIENT_CLINIC_OR_DEPARTMENT_OTHER): Payer: Medicare HMO | Admitting: Audiologist

## 2017-11-26 ENCOUNTER — Ambulatory Visit: Payer: Medicare HMO | Attending: Audiologist | Admitting: Otology & Neurotology

## 2017-11-26 VITALS — BP 110/60 | HR 82 | Ht 66.0 in

## 2017-11-26 DIAGNOSIS — H9201 Otalgia, right ear: Secondary | ICD-10-CM | POA: Insufficient documentation

## 2017-11-26 DIAGNOSIS — H6504 Acute serous otitis media, recurrent, right ear: Secondary | ICD-10-CM | POA: Insufficient documentation

## 2017-11-26 DIAGNOSIS — Z6841 Body Mass Index (BMI) 40.0 and over, adult: Secondary | ICD-10-CM

## 2017-11-26 NOTE — Progress Notes (Signed)
CC:  Hearing Problem (chronic OM)      Referral Source:  [place], Fayrene Helper, *    Sharon Stephens is a 42 year old female who presents with 10 weeks of right otalgia. She reports that this began after a sinus infection. She was started on flonase and has been using this intermittent. She reports pain that occurs mostly at night. During the day, she does fairly well and controls pain with acetaminophen. She denies vertigo, hearing loss, otorrhea, pressure, or facial weakness. She does not have a history of recurrent ear infections. She does endorse some nasal obstruction. She is also bothered by intermittent popping sound in the right ear.       Review of systems:  Pt denies: shortness of breath, chest pain, palpitations, sweating, nausea, weakness, numbness, headache, slurred speech, loss of bowel or bladder control, visual changes, changes in cognitive function, hearing loss, tinnitis, ear pain on the left, sinus congestion, cough  fever, chills, weight loss, dysphagia, odynophagia, facial weakness, numbness or voice changes.    Please refer to the scanned patient intake form from today for a complete review of systems.  This was reviewed and verified with the patient today. Pertinent positives are noted above.     Outpatient Medications Marked as Taking for the 11/26/17 encounter (Office Visit) with Dawayne Patricia, MD   Medication Sig Dispense Refill   . Acetaminophen 325 MG Oral Tab Take 1 tablet (325 mg) by mouth every 6 hours as needed for pain. For Pain. 50 tablet 3   . Albuterol Sulfate HFA 108 (90 Base) MCG/ACT Inhalation Aero Soln Inhale 2 puffs by mouth every 6 hours as needed for shortness of breath/wheezing. For wheezing and/or cough. 1 Inhaler 5   . [START ON 11/27/2017] Calcium Carbonate-Vitamin D 600-400 MG-UNIT Oral Tab Take 1 tablet by mouth 2 times a day. Start taking once you've completed once weekly vitamin d 60 tablet 2   . Cyanocobalamin 1000 MCG/ML Injection Solution Inject 0.1 mL  (100 mcg) intramuscularly every month. 1 vial 5   . Diclofenac Sodium 1 % Transdermal Gel Apply 2 g to affected area on knee(s) 4 times a day as needed (pain). 60 g 1   . Fluticasone Propionate 50 MCG/ACT Nasal Suspension Spray 2 sprays into each nostril daily. 1 Inhaler 4   . levoFLOXacin 500 MG Oral Tab Take 1 tablet (500 mg) by mouth every 24 hours. 10 tablet 0   . Levonorgestrel 20 MCG/24HR Intrauterine IUD 1 Intra Uterine Device by Intrauterine route One time.     . Loratadine 10 MG Oral Tab Take 1 tablet (10 mg) by mouth daily. 30 tablet 0   . Multiple Vitamin (MULTI VITAMIN MENS) Oral Tab Take 1 tablet by mouth daily. 30 tablet 4   . Vitamin D, Ergocalciferol, 50000 units Oral Cap Take 1 capsule (50,000 Units) by mouth every 7 days. 4 capsule 0       Allergies  Amoxicillin; Gabapentin; Ibuprofen; Penicillin g; and Penicillinsfunction [place] to top      Past Medical History  Past Medical History:   Diagnosis Date   . Allergic rhinitis, cause unspecified    . Chronic obstructive asthma, unspecified (HCC)    . Depressive disorder, not elsewhere classified    . Esophageal reflux    . Essential hypertension, benign    . Generalized osteoarthrosis, unspecified site    . Hernia of other specified sites of abdominal cavity without mention of obstruction or gangrene  notes having 3 in abdomen    . Morbid obesity (HCC)    . Type II or unspecified type diabetes mellitus without mention of complication, not stated as uncontrolled (HCC)     broaderline    . Unspecified sleep apnea        History sections of chart reviewed and updated today:  Yes     Neurological history:   Positive for no neurological problems.    Past history of known ototoxic exposures: no       Social History     Tobacco Use   . Smoking status: Never Smoker   . Smokeless tobacco: Never Used   Substance Use Topics   . Alcohol use: No         Social/Employment  Recently moved to the area.    Noise Exposure:  no    Family History:  Negative for early  onset hearing or balance disorders.        Exam:  BP 110/60   Pulse 82   Ht  (1.676 m)   BMI 64.56 kg/m     General appearance: healthy, no distress, gait normal, voice normal, alert and oriented  Eyes: Lids/periorbital skin normal, Conjunctivae/corneas clear, PERRL, EOM's intact, no nystagmus, smooth pursuit normal    Microscopic ear exam:   Right external ear normal, ear canal dry and excoriated, TM - normal, middle ear aerated  Left external ear normal , ear canal normal, TM - normal, middle ear aerated    Dix Hallpike normal  Head thrust test normal  Post head shake nystagmus- normal.  Romberg: normal    Nose/sinus exam: Nares normal. Septum midline. Mucosa normal. No drainage. No sinus tenderness.  Oral cavity/Oropharynx: normal, floor of mouth soft with no masses, No TMJ tenderness.  Neck: supple, no adenopathy and thyroid normal size, non-tender,  without nodularity  Carotids: 2+ bilaterally    Neuro: cranial nerves 2-12 intact  Skin: Color, texture, turgor normal. No rashes or concerning lesions    Diagnostic Studies  An audiogram was obtained today and was available for review.      Tympanometry:  Right Ear:   Normal Type A tympanogram with volume, pressure and compliance all within the normal range.  Left Ear:  Normal Type A tympanogram with volume, pressure and compliance all within the normal range.    Audiometry:  Right Ear:  WNL  Left Ear:  WNL    Speech Discrimination (Word Recognition):  Right Ear:  96% words correct at 55 dBHL, presented in quiet with contralateral masking  Left Ear:  100% words correct at 55 dBHL, presented in quiet with contralateral masking    Imaging  No recent imaging    A/P:   (H92.01) Otalgia of right ear  (primary encounter diagnosis)    42 yo female with 10 week history of otalgia. She does have evidence of otitis externa on the right and we recommend she use olive oil in an eye dropper 5 drops once a week to help moisturize the canal. For the popping sensation,  she likely has some degree of eustachian tube dysfunction and we recommend nasal irrigations and flonase. She should return to see [place] as needed in the future.

## 2017-11-26 NOTE — Progress Notes (Signed)
Sharon Stephens was referred by Dr. Leonides Cave MD PhD and seen in conjunction with otology visit.   Further details can be viewed in the note dictated by the attending otologist.  Subjective:  42 year old female with report of decreased hearing and occasional popping sounds in the right ear x 10 weeks following an ear infection. She denied all other aural symptoms today.       Objective:  Audiogram and tympanogram completed.  See scan of results under media tab in Epic.      Tympanometry:  Right Ear:   Normal Type A tympanogram with volume, pressure and compliance all within the normal range.  Left Ear:  Normal Type A tympanogram with volume, pressure and compliance all within the normal range.    Audiometry:  Right Ear:  WNL  Left Ear:  WNL    Speech Discrimination (Word Recognition):  Right Ear:  96% words correct at 55 dBHL, presented in quiet with contralateral masking  Left Ear:  100% words correct at 55 dBHL, presented in quiet with contralateral masking       Plan:  Patient to be seen in otology immediately following this visit.

## 2017-11-27 NOTE — Progress Notes (Signed)
[facility]-Outpatient Progress Note    11/12/2017    Reason for Visit:  Chief Complaint   Patient presents with   . Medication Management     Subjective: Client presents to nurse, interjecting in the presence  of another client.  Reported she missed an appt with the doctor next door yesterday. She needs the nurse to give her injection once a month. CLient requesting [facility] RN to assist with injection    Denied any other concerns to be addressed for this encounter    Site Where Served: [facility]    Where Slept Last Night: [facility]    Living Arrangements: homeless    Pt's PCP:  Penni Bombard, MD    Vitals:  There were no vitals taken for this visit.    Patient idenitfier: name, date of birth   General: groomed, dressed in clean casual street clothes   Eyes: glasses   Ears, Nose, Mouth, Throat: MMM, hears in normal tone of voice   Cardiovascular: appears adequately perfused   Respiratory: normal respiratory effort, speaking in full sentences   Gastrointestinal: no incontinence   Genitourinary: no incontinence   Musculoskeletal: amb indep, steady gait   Skin: color/turgor WNL   Neurological: alert and oriented   Psychiatric: mood and affect appropriate  Chart/appts reviewed Client missed appt schedule May 7. Client  prescribed cyanocobalamin injection once a month    A: CLient seen today for assistance with medication management    Referrals: yes    Medications provided per protocol: N/A    Plan: RN discussed to address medication management in 20 min Client agreed to plan  1420 RN outreached client sitting in the dining room playing Bingo. CLient declined FU at this time. CLient agreed to FU RN at 3 PM  1500 RN outreached client sitting in the day room socializing with other [facility] participants. CLient declined FU at this time.            RN encouraged client to reschedule PCP appt at [facility] and discuss medication administration.    1530 RN notified PCP attempted to assist  client with medication administration.      Electronically signed:  Juanito Doom, RN

## 2017-12-04 ENCOUNTER — Inpatient Hospital Stay: Payer: Self-pay

## 2017-12-15 ENCOUNTER — Encounter (HOSPITAL_BASED_OUTPATIENT_CLINIC_OR_DEPARTMENT_OTHER): Payer: Medicare HMO | Admitting: Psychiatry

## 2017-12-15 NOTE — Progress Notes (Signed)
I, Sharon Stephens, personally examined Sharon Stephens, elicited the history and reviewed the diagnostic studies with the patient and our resident Dr. Hyacinth MeekerMIller.   I  agree with the edited note and plan that are appended.

## 2017-12-17 ENCOUNTER — Emergency Department (HOSPITAL_BASED_OUTPATIENT_CLINIC_OR_DEPARTMENT_OTHER)
Admission: EM | Admit: 2017-12-17 | Discharge: 2017-12-18 | Disposition: A | Discharge status: Home / Self Care | Payer: Medicare HMO | Attending: Emergency Medicine | Admitting: Emergency Medicine

## 2017-12-17 ENCOUNTER — Ambulatory Visit (HOSPITAL_BASED_OUTPATIENT_CLINIC_OR_DEPARTMENT_OTHER): Payer: Medicare HMO

## 2017-12-17 DIAGNOSIS — E119 Type 2 diabetes mellitus without complications: Secondary | ICD-10-CM | POA: Insufficient documentation

## 2017-12-17 DIAGNOSIS — M7918 Myalgia, other site: Secondary | ICD-10-CM | POA: Insufficient documentation

## 2017-12-17 DIAGNOSIS — F329 Major depressive disorder, single episode, unspecified: Secondary | ICD-10-CM | POA: Insufficient documentation

## 2017-12-17 DIAGNOSIS — M79661 Pain in right lower leg: Secondary | ICD-10-CM | POA: Insufficient documentation

## 2017-12-17 DIAGNOSIS — Z590 Homelessness: Secondary | ICD-10-CM | POA: Insufficient documentation

## 2017-12-17 DIAGNOSIS — Z79899 Other long term (current) drug therapy: Secondary | ICD-10-CM | POA: Insufficient documentation

## 2017-12-17 DIAGNOSIS — M79604 Pain in right leg: Secondary | ICD-10-CM | POA: Insufficient documentation

## 2017-12-17 DIAGNOSIS — R079 Chest pain, unspecified: Secondary | ICD-10-CM | POA: Insufficient documentation

## 2017-12-17 DIAGNOSIS — I10 Essential (primary) hypertension: Secondary | ICD-10-CM | POA: Insufficient documentation

## 2017-12-17 NOTE — Telephone Encounter (Signed)
Pt called clinic inquiring what she should do about new leg swelling. Pt states only RLE is swollen and notes having a DVT in her leg in the past. Pt advised to present to ED to have swelling evaluated for possible blood clot. Pt states understanding and agrees to go to ED today.

## 2017-12-18 ENCOUNTER — Emergency Department (EMERGENCY_DEPARTMENT_HOSPITAL)
Admission: EM | Admit: 2017-12-18 | Discharge: 2017-12-18 | Disposition: A | Discharge status: Home / Self Care | Payer: Medicare HMO | Source: Home / Self Care

## 2017-12-18 ENCOUNTER — Other Ambulatory Visit: Payer: Self-pay | Admitting: Emergency Medicine

## 2017-12-18 ENCOUNTER — Other Ambulatory Visit: Payer: Self-pay

## 2017-12-18 DIAGNOSIS — M79661 Pain in right lower leg: Secondary | ICD-10-CM

## 2017-12-18 DIAGNOSIS — R079 Chest pain, unspecified: Secondary | ICD-10-CM

## 2017-12-18 DIAGNOSIS — F329 Major depressive disorder, single episode, unspecified: Secondary | ICD-10-CM

## 2017-12-18 DIAGNOSIS — Z590 Homelessness: Secondary | ICD-10-CM

## 2017-12-18 DIAGNOSIS — Z79899 Other long term (current) drug therapy: Secondary | ICD-10-CM

## 2017-12-18 DIAGNOSIS — M79604 Pain in right leg: Secondary | ICD-10-CM

## 2017-12-18 LAB — BASIC METABOLIC PANEL
Anion Gap: 8 (ref 4–12)
Calcium: 8.8 mg/dL — ABNORMAL LOW (ref 8.9–10.2)
Carbon Dioxide, Total: 25 meq/L (ref 22–32)
Chloride: 104 meq/L (ref 98–108)
Creatinine: 0.73 mg/dL (ref 0.38–1.02)
GFR, Calc, African American: >60 mL/min/{1.73_m2} (ref >59)
GFR, Calc, European American: >60 mL/min/{1.73_m2} (ref >59)
Glucose: 99 mg/dL (ref 62–125)
Potassium: 3.9 meq/L (ref 3.6–5.2)
Sodium: 137 meq/L (ref 135–145)
Urea Nitrogen: 17 mg/dL (ref 8–21)

## 2017-12-18 LAB — CBC, DIFF
% Basophils: 1 %
% Eosinophils: 1 %
% Immature Granulocytes: 0 %
% Lymphocytes: 34 %
% Monocytes: 8 %
% Neutrophils: 56 %
% Nucleated RBC: 0 %
Absolute Eosinophil Count: 0.09 10*3/uL (ref 0.00–0.50)
Absolute Lymphocyte Count: 2.48 10*3/uL (ref 1.00–4.80)
Basophils: 0.04 10*3/uL (ref 0.00–0.20)
Hematocrit: 40 % (ref 36–45)
Hemoglobin: 12.4 g/dL (ref 11.5–15.5)
Immature Granulocytes: 0.03 10*3/uL (ref 0.00–0.05)
MCH: 28.1 pg (ref 27.3–33.6)
MCHC: 31.3 g/dL — ABNORMAL LOW (ref 32.2–36.5)
MCV: 90 fL (ref 81–98)
Monocytes: 0.59 10*3/uL (ref 0.00–0.80)
Neutrophils: 4.04 10*3/uL (ref 1.80–7.00)
Nucleated RBC: 0 10*3/uL
Platelet Count: 215 10*3/uL (ref 150–400)
RBC: 4.42 10*6/uL (ref 3.80–5.00)
RDW-CV: 13.8 % (ref 11.6–14.4)
WBC: 7.27 10*3/uL (ref 4.30–10.00)

## 2017-12-18 LAB — LAB ADD ON ORDER

## 2017-12-18 LAB — TROPONIN_I
Troponin_I Interpretation: NORMAL
Troponin_I Interpretation: NORMAL
Troponin_I: <0.03 ng/mL (ref <0.04)
Troponin_I: <0.03 ng/mL (ref <0.04)

## 2017-12-18 LAB — D-DIMER,QUANT: D_Dimer, Quant: 0.47 ug{FEU}/mL (ref 0.00–0.59)

## 2017-12-21 ENCOUNTER — Inpatient Hospital Stay: Payer: Self-pay

## 2017-12-21 LAB — EKG 12 LEAD
Atrial Rate: 67 {beats}/min
Diagnosis: NORMAL
P Axis: 43 degrees
P-R Interval: 132 ms
Q-T Interval: 418 ms
QRS Duration: 98 ms
QTC Calculation: 441 ms
R Axis: -27 degrees
T Axis: 27 degrees
Ventricular Rate: 67 {beats}/min

## 2017-12-23 ENCOUNTER — Encounter (HOSPITAL_BASED_OUTPATIENT_CLINIC_OR_DEPARTMENT_OTHER): Payer: Medicare HMO | Admitting: Internal Medicine

## 2017-12-28 ENCOUNTER — Encounter (HOSPITAL_BASED_OUTPATIENT_CLINIC_OR_DEPARTMENT_OTHER): Payer: Medicare HMO | Admitting: Registered Nurse

## 2017-12-28 ENCOUNTER — Ambulatory Visit (HOSPITAL_BASED_OUTPATIENT_CLINIC_OR_DEPARTMENT_OTHER): Payer: Medicare HMO | Admitting: Registered"

## 2017-12-30 ENCOUNTER — Emergency Department (HOSPITAL_BASED_OUTPATIENT_CLINIC_OR_DEPARTMENT_OTHER)
Admission: EM | Admit: 2017-12-30 | Discharge: 2017-12-30 | Disposition: A | Discharge status: Home / Self Care | Payer: Medicare HMO | Attending: Nurse Practitioner | Admitting: Nurse Practitioner

## 2017-12-30 DIAGNOSIS — M5416 Radiculopathy, lumbar region: Secondary | ICD-10-CM | POA: Insufficient documentation

## 2017-12-30 DIAGNOSIS — Z590 Homelessness: Secondary | ICD-10-CM | POA: Insufficient documentation

## 2017-12-30 DIAGNOSIS — M5442 Lumbago with sciatica, left side: Secondary | ICD-10-CM | POA: Insufficient documentation

## 2017-12-30 LAB — URINALYSIS COMPLETE, URN
Bilirubin (Qual), URN: NEGATIVE
Epith Cells_Renal/Trans,URN: NEGATIVE /HPF
Epith Cells_Squamous, URN: NEGATIVE /LPF
Glucose Qual, URN: NEGATIVE mg/dL
Ketones, URN: NEGATIVE mg/dL
Leukocyte Esterase, URN: NEGATIVE
Nitrite, URN: NEGATIVE
Occult Blood, URN: NEGATIVE
Protein (Alb Semiquant), URN: NEGATIVE mg/dL
RBC, URN: NEGATIVE /HPF
Specific Gravity, URN: 1.001 g/mL — ABNORMAL LOW (ref 1.006–1.027)
WBC, URN: NEGATIVE /HPF
pH, URN: 5.5 (ref 5.0–8.0)

## 2018-01-05 ENCOUNTER — Inpatient Hospital Stay: Payer: Self-pay

## 2018-01-06 ENCOUNTER — Emergency Department (EMERGENCY_DEPARTMENT_HOSPITAL)
Admission: EM | Admit: 2018-01-06 | Discharge: 2018-01-07 | Disposition: A | Discharge status: Home / Self Care | Payer: Medicare HMO | Source: Home / Self Care

## 2018-01-06 ENCOUNTER — Emergency Department (HOSPITAL_BASED_OUTPATIENT_CLINIC_OR_DEPARTMENT_OTHER)
Admission: EM | Admit: 2018-01-06 | Discharge: 2018-01-06 | Disposition: A | Discharge status: Home / Self Care | Payer: Medicare HMO | Attending: Emergency Medicine | Admitting: Emergency Medicine

## 2018-01-06 ENCOUNTER — Other Ambulatory Visit: Payer: Self-pay

## 2018-01-06 ENCOUNTER — Emergency Department (EMERGENCY_DEPARTMENT_HOSPITAL)
Admission: EM | Admit: 2018-01-06 | Discharge: 2018-01-06 | Disposition: A | Discharge status: Home / Self Care | Payer: Medicare HMO | Source: Home / Self Care

## 2018-01-06 ENCOUNTER — Other Ambulatory Visit: Payer: Self-pay | Admitting: Student in an Organized Health Care Education/Training Program

## 2018-01-06 DIAGNOSIS — I10 Essential (primary) hypertension: Secondary | ICD-10-CM

## 2018-01-06 DIAGNOSIS — F15929 Other stimulant use, unspecified with intoxication, unspecified: Secondary | ICD-10-CM

## 2018-01-06 DIAGNOSIS — Z590 Homelessness: Secondary | ICD-10-CM | POA: Insufficient documentation

## 2018-01-06 DIAGNOSIS — F10929 Alcohol use, unspecified with intoxication, unspecified: Secondary | ICD-10-CM | POA: Insufficient documentation

## 2018-01-06 DIAGNOSIS — F10229 Alcohol dependence with intoxication, unspecified: Secondary | ICD-10-CM | POA: Insufficient documentation

## 2018-01-06 DIAGNOSIS — F1420 Cocaine dependence, uncomplicated: Secondary | ICD-10-CM | POA: Insufficient documentation

## 2018-01-06 DIAGNOSIS — Z915 Personal history of self-harm: Secondary | ICD-10-CM | POA: Insufficient documentation

## 2018-01-06 DIAGNOSIS — R002 Palpitations: Secondary | ICD-10-CM | POA: Insufficient documentation

## 2018-01-06 DIAGNOSIS — F1210 Cannabis abuse, uncomplicated: Secondary | ICD-10-CM

## 2018-01-06 DIAGNOSIS — F39 Unspecified mood [affective] disorder: Secondary | ICD-10-CM

## 2018-01-06 DIAGNOSIS — Z720 Tobacco use: Secondary | ICD-10-CM | POA: Insufficient documentation

## 2018-01-06 DIAGNOSIS — R000 Tachycardia, unspecified: Secondary | ICD-10-CM

## 2018-01-06 DIAGNOSIS — F1490 Cocaine use, unspecified, uncomplicated: Secondary | ICD-10-CM | POA: Insufficient documentation

## 2018-01-06 DIAGNOSIS — R079 Chest pain, unspecified: Secondary | ICD-10-CM

## 2018-01-06 DIAGNOSIS — F1590 Other stimulant use, unspecified, uncomplicated: Secondary | ICD-10-CM | POA: Insufficient documentation

## 2018-01-06 DIAGNOSIS — J984 Other disorders of lung: Secondary | ICD-10-CM | POA: Insufficient documentation

## 2018-01-06 LAB — EKG 12 LEAD
Atrial Rate: 107 {beats}/min
P Axis: 72 degrees
P-R Interval: 142 ms
Q-T Interval: 374 ms
QRS Duration: 100 ms
QTC Calculation: 499 ms
R Axis: -27 degrees
T Axis: 77 degrees
Ventricular Rate: 107 {beats}/min

## 2018-01-06 LAB — BASIC METABOLIC PANEL
Anion Gap: 11 (ref 4–12)
Calcium: 8.8 mg/dL — ABNORMAL LOW (ref 8.9–10.2)
Carbon Dioxide, Total: 27 meq/L (ref 22–32)
Chloride: 103 meq/L (ref 98–108)
Creatinine: 0.73 mg/dL (ref 0.38–1.02)
GFR, Calc, African American: >60 mL/min/{1.73_m2} (ref >59)
GFR, Calc, European American: >60 mL/min/{1.73_m2} (ref >59)
Glucose: 110 mg/dL (ref 62–125)
Potassium: 3.8 meq/L (ref 3.6–5.2)
Sodium: 141 meq/L (ref 135–145)
Urea Nitrogen: 12 mg/dL (ref 8–21)

## 2018-01-06 LAB — CBC (HEMOGRAM)
Hematocrit: 44 % (ref 36–45)
Hemoglobin: 14 g/dL (ref 11.5–15.5)
MCH: 28.2 pg (ref 27.3–33.6)
MCHC: 31.8 g/dL — ABNORMAL LOW (ref 32.2–36.5)
MCV: 89 fL (ref 81–98)
Platelet Count: 363 10*3/uL (ref 150–400)
RBC: 4.96 10*6/uL (ref 3.80–5.00)
RDW-CV: 13.8 % (ref 11.6–14.4)
WBC: 8.29 10*3/uL (ref 4.30–10.00)

## 2018-01-06 LAB — TSH WITH REFLEXIVE FREE T4: TSH with Reflexive Free T4: 3.615 u[IU]/mL (ref 0.400–5.000)

## 2018-01-06 LAB — TROPONIN_I
Troponin_I Interpretation: NORMAL
Troponin_I: <0.03 ng/mL (ref <0.04)

## 2018-01-07 ENCOUNTER — Emergency Department
Admission: EM | Admit: 2018-01-07 | Discharge: 2018-01-08 | Disposition: A | Discharge status: Home / Self Care | Payer: Medicare HMO | Attending: Emergency Medicine | Admitting: Emergency Medicine

## 2018-01-07 DIAGNOSIS — I10 Essential (primary) hypertension: Secondary | ICD-10-CM | POA: Insufficient documentation

## 2018-01-07 DIAGNOSIS — Y908 Blood alcohol level of 240 mg/100 ml or more: Secondary | ICD-10-CM | POA: Insufficient documentation

## 2018-01-07 DIAGNOSIS — R45851 Suicidal ideations: Secondary | ICD-10-CM | POA: Insufficient documentation

## 2018-01-07 DIAGNOSIS — F10129 Alcohol abuse with intoxication, unspecified: Secondary | ICD-10-CM | POA: Insufficient documentation

## 2018-01-07 DIAGNOSIS — F1210 Cannabis abuse, uncomplicated: Secondary | ICD-10-CM | POA: Insufficient documentation

## 2018-01-07 DIAGNOSIS — E119 Type 2 diabetes mellitus without complications: Secondary | ICD-10-CM | POA: Insufficient documentation

## 2018-01-07 DIAGNOSIS — Z880 Allergy status to penicillin: Secondary | ICD-10-CM | POA: Insufficient documentation

## 2018-01-07 DIAGNOSIS — F1410 Cocaine abuse, uncomplicated: Secondary | ICD-10-CM | POA: Insufficient documentation

## 2018-01-07 DIAGNOSIS — F1510 Other stimulant abuse, uncomplicated: Secondary | ICD-10-CM | POA: Insufficient documentation

## 2018-01-07 DIAGNOSIS — Z79899 Other long term (current) drug therapy: Secondary | ICD-10-CM | POA: Insufficient documentation

## 2018-01-07 LAB — COMPREHENSIVE METABOLIC PANEL
ALT (GPT): 13 U/L (ref 7–33)
AST (GOT): 32 U/L (ref 9–38)
Albumin: 3.8 g/dL (ref 3.5–5.2)
Alkaline Phosphatase (Total): 72 U/L (ref 25–112)
Anion Gap: 12 (ref 4–12)
Bilirubin (Total): 0.8 mg/dL (ref 0.2–1.3)
Calcium: 8.7 mg/dL — ABNORMAL LOW (ref 8.9–10.2)
Carbon Dioxide, Total: 20 meq/L — ABNORMAL LOW (ref 22–32)
Chloride: 100 meq/L (ref 98–108)
Creatinine: 0.56 mg/dL (ref 0.38–1.02)
GFR, Calc, African American: >60 mL/min/{1.73_m2} (ref >59)
GFR, Calc, European American: >60 mL/min/{1.73_m2} (ref >59)
Glucose: 109 mg/dL (ref 62–125)
Potassium: 3.3 meq/L — ABNORMAL LOW (ref 3.6–5.2)
Protein (Total): 7 g/dL (ref 6.0–8.2)
Sodium: 132 meq/L — ABNORMAL LOW (ref 135–145)
Urea Nitrogen: 6 mg/dL — ABNORMAL LOW (ref 8–21)

## 2018-01-07 LAB — CBC, DIFF
% Basophils: 1 %
% Eosinophils: 1 %
% Immature Granulocytes: 0 %
% Lymphocytes: 41 %
% Monocytes: 5 %
% Neutrophils: 52 %
% Nucleated RBC: 0 %
Absolute Eosinophil Count: 0.06 10*3/uL (ref 0.00–0.50)
Absolute Lymphocyte Count: 4.05 10*3/uL (ref 1.00–4.80)
Basophils: 0.06 10*3/uL (ref 0.00–0.20)
Hematocrit: 41 % (ref 36–45)
Hemoglobin: 12.7 g/dL (ref 11.5–15.5)
Immature Granulocytes: 0.04 10*3/uL (ref 0.00–0.05)
MCH: 28.2 pg (ref 27.3–33.6)
MCHC: 31.1 g/dL — ABNORMAL LOW (ref 32.2–36.5)
MCV: 91 fL (ref 81–98)
Monocytes: 0.5 10*3/uL (ref 0.00–0.80)
Neutrophils: 5.29 10*3/uL (ref 1.80–7.00)
Nucleated RBC: 0 10*3/uL
Platelet Count: 222 10*3/uL (ref 150–400)
RBC: 4.5 10*6/uL (ref 3.80–5.00)
RDW-CV: 13.7 % (ref 11.6–14.4)
WBC: 10 10*3/uL (ref 4.3–10.0)

## 2018-01-07 LAB — ALCOHOL (ETHYL): Alcohol (Ethyl): 283 mg/dL — AB

## 2018-01-07 LAB — URINALYSIS WITH REFLEX CULTURE
Bilirubin (Qual), URN: NEGATIVE
Epith Cells_Renal/Trans,URN: NEGATIVE /HPF
Epith Cells_Squamous, URN: NEGATIVE /LPF
Glucose Qual, URN: NEGATIVE mg/dL
Ketones, URN: NEGATIVE mg/dL
Leukocyte Esterase, URN: NEGATIVE
Nitrite, URN: NEGATIVE
Occult Blood, URN: NEGATIVE
Protein (Alb Semiquant), URN: NEGATIVE mg/dL
RBC, URN: NEGATIVE /HPF
Specific Gravity, URN: <1.006 g/mL — ABNORMAL LOW (ref 1.006–1.027)
WBC, URN: NEGATIVE /HPF
pH, URN: 6.5 (ref 5.0–8.0)

## 2018-01-07 LAB — STANDARD DRUG SCREEN, URN
Acetaminophen Qualitative, URN: NEGATIVE
Acetaminophen Qualitative, URN: NEGATIVE
Alcohol (Ethyl), URN: 321 mg/dL — AB
Alcohol (Ethyl), URN: 54 mg/dL — AB
Amphet/Methamphetamine Qual,URN: NEGATIVE
Amphet/Methamphetamine Qual,URN: POSITIVE — AB
Barbiturate (Qual), URN: NEGATIVE
Barbiturate (Qual), URN: NEGATIVE
Benzodiazepines (Qual), URN: NEGATIVE
Benzodiazepines (Qual), URN: NEGATIVE
Cannabinoids (Qual), URN: NEGATIVE
Cannabinoids (Qual), URN: POSITIVE — AB
Cocaine (Qual), URN: NEGATIVE
Cocaine (Qual), URN: NEGATIVE
Methadone (Qual), URN: NEGATIVE
Methadone (Qual), URN: NEGATIVE
Opiates (Qual), URN: NEGATIVE
Opiates (Qual), URN: NEGATIVE
Phencyclidine (Qual), URN: NEGATIVE
Phencyclidine (Qual), URN: NEGATIVE
Tricyclic Antidepressants, URN: NEGATIVE
Tricyclic Antidepressants, URN: NEGATIVE

## 2018-01-07 LAB — SALICYLATE: Salicylate: <3 mg/dL (ref 0–30)

## 2018-01-07 LAB — PREGNANCY (HCG), SERUM, QUANT: Pregnancy (HCG), SRM: <1 m[IU]/mL (ref <6)

## 2018-01-07 LAB — TROPONIN_I
Troponin_I Interpretation: NORMAL
Troponin_I: 0.03 ng/mL (ref <0.04)

## 2018-01-07 LAB — LAB ADD ON ORDER

## 2018-01-07 LAB — ACETAMINOPHEN (TYLENOL): Acetaminophen (Tylenol): <10 ug/mL (ref 0–25)

## 2018-01-08 ENCOUNTER — Emergency Department
Admission: EM | Admit: 2018-01-08 | Discharge: 2018-01-08 | Payer: Medicare HMO | Attending: Emergency Medicine | Admitting: Emergency Medicine

## 2018-01-08 DIAGNOSIS — Y908 Blood alcohol level of 240 mg/100 ml or more: Secondary | ICD-10-CM | POA: Insufficient documentation

## 2018-01-08 DIAGNOSIS — R45851 Suicidal ideations: Secondary | ICD-10-CM

## 2018-01-08 DIAGNOSIS — F329 Major depressive disorder, single episode, unspecified: Secondary | ICD-10-CM | POA: Insufficient documentation

## 2018-01-08 DIAGNOSIS — F10129 Alcohol abuse with intoxication, unspecified: Secondary | ICD-10-CM | POA: Insufficient documentation

## 2018-01-08 LAB — COMPREHENSIVE METABOLIC PANEL
ALT (GPT): 16 U/L (ref 7–33)
AST (GOT): 30 U/L (ref 9–38)
Albumin: 4 g/dL (ref 3.5–5.2)
Alkaline Phosphatase (Total): 74 U/L (ref 25–112)
Anion Gap: 9 (ref 4–12)
Bilirubin (Total): 0.8 mg/dL (ref 0.2–1.3)
Calcium: 8.7 mg/dL — ABNORMAL LOW (ref 8.9–10.2)
Carbon Dioxide, Total: 29 meq/L (ref 22–32)
Chloride: 101 meq/L (ref 98–108)
Creatinine: 0.63 mg/dL (ref 0.38–1.02)
GFR, Calc, African American: >60 mL/min/{1.73_m2} (ref >59)
GFR, Calc, European American: >60 mL/min/{1.73_m2} (ref >59)
Glucose: 99 mg/dL (ref 62–125)
Potassium: 4 meq/L (ref 3.6–5.2)
Protein (Total): 7.4 g/dL (ref 6.0–8.2)
Sodium: 139 meq/L (ref 135–145)
Urea Nitrogen: 7 mg/dL — ABNORMAL LOW (ref 8–21)

## 2018-01-08 LAB — EMERGENCY DRUG SCREEN, URINE
Amphetamine Qual, Urine: NEGATIVE
Barbiturate Qual, Urine: NEGATIVE
Benzodiazepines Qual, Urine: NEGATIVE
Cannabinoids Qual, Urine: POSITIVE — AB
Cocaine Qual, Urine: NEGATIVE
Methadone Qual, Urine: NEGATIVE
Methamphetamine Qual, Urine: NEGATIVE
Opiates Qual, Urine: NEGATIVE
Oxycodone Qual, Urine: NEGATIVE
Phencyclidine Qual, Urine: NEGATIVE
Tricyclic Antidepressants, Urine: NEGATIVE

## 2018-01-08 LAB — CBC (HEMOGRAM)
Hematocrit: 44 % (ref 36–45)
Hemoglobin: 13.8 g/dL (ref 11.5–15.5)
MCH: 28.2 pg (ref 27.3–33.6)
MCHC: 31.6 g/dL — ABNORMAL LOW (ref 32.2–36.5)
MCV: 89 fL (ref 81–98)
Platelet Count: 321 10*3/uL (ref 150–400)
RBC: 4.9 10*6/uL (ref 3.80–5.00)
RDW-CV: 14 % (ref 11.6–14.4)
WBC: 6.4 10*3/uL (ref 4.3–10.0)

## 2018-01-08 LAB — EKG 12 LEAD
Atrial Rate: 91 {beats}/min
Diagnosis: NORMAL
P Axis: 60 degrees
P-R Interval: 158 ms
Q-T Interval: 388 ms
QRS Duration: 98 ms
QTC Calculation: 477 ms
R Axis: -20 degrees
T Axis: 52 degrees
Ventricular Rate: 91 {beats}/min

## 2018-01-08 LAB — ALCOHOL (ETHYL): Alcohol (Ethyl): 292 mg/dL — AB

## 2018-01-09 ENCOUNTER — Emergency Department (HOSPITAL_BASED_OUTPATIENT_CLINIC_OR_DEPARTMENT_OTHER)
Admission: EM | Admit: 2018-01-09 | Discharge: 2018-01-09 | Disposition: A | Discharge status: Home / Self Care | Payer: Medicare HMO | Attending: Psychiatry | Admitting: Psychiatry

## 2018-01-09 DIAGNOSIS — F10229 Alcohol dependence with intoxication, unspecified: Secondary | ICD-10-CM | POA: Insufficient documentation

## 2018-01-09 DIAGNOSIS — F39 Unspecified mood [affective] disorder: Secondary | ICD-10-CM | POA: Insufficient documentation

## 2018-01-09 LAB — URINALYSIS COMPLETE, URN
Bilirubin (Qual), URN: NEGATIVE
Epith Cells_Renal/Trans,URN: NEGATIVE /HPF
Epith Cells_Squamous, URN: NEGATIVE /LPF
Glucose Qual, URN: NEGATIVE mg/dL
Ketones, URN: NEGATIVE mg/dL
Leukocyte Esterase, URN: NEGATIVE
Nitrite, URN: NEGATIVE
Occult Blood, URN: NEGATIVE
Protein (Alb Semiquant), URN: NEGATIVE mg/dL
RBC, URN: NEGATIVE /HPF
Specific Gravity, URN: 1.001 g/mL — ABNORMAL LOW (ref 1.006–1.027)
WBC, URN: NEGATIVE /HPF
pH, URN: 5.5 (ref 5.0–8.0)

## 2018-01-09 LAB — DRUG (PSYCHIATRIC) SCRN, URN
Alcohol (Ethyl), URN: 278 mg/dL — AB
Amphet/Methamphetamine Qual,URN: NEGATIVE
Barbiturate (Qual), URN: NEGATIVE
Benzodiazepines (Qual), URN: NEGATIVE
Cannabinoids (Qual), URN: NEGATIVE
Cocaine (Qual), URN: NEGATIVE
Opiates (Qual), URN: NEGATIVE
Phencyclidine (Qual), URN: NEGATIVE

## 2018-01-12 ENCOUNTER — Encounter (HOSPITAL_BASED_OUTPATIENT_CLINIC_OR_DEPARTMENT_OTHER): Payer: Medicare HMO | Admitting: Registered Nurse

## 2018-01-12 ENCOUNTER — Encounter (HOSPITAL_BASED_OUTPATIENT_CLINIC_OR_DEPARTMENT_OTHER): Payer: Self-pay | Admitting: Registered Nurse

## 2018-01-12 ENCOUNTER — Ambulatory Visit (HOSPITAL_BASED_OUTPATIENT_CLINIC_OR_DEPARTMENT_OTHER): Payer: Medicare HMO

## 2018-01-12 NOTE — Progress Notes (Addendum)
Extensive chart review was done prior to 01/12/2018 clinic visit.  The patient was a no show for today's visit.  Her last bariatric surgery clinic visit was on 07/22/2012.    BARIATRIC SURGERY PROFILE:  Surgery:  Open Gastric Bypass.  Date of Surgery (DOS): 06/07/2010      Surgeon:  Lelon Huhellinger  DOS Weight:  481 pounds    Ht: 66 inches     DOS BMI:  78.4   Highest [facility] Program Weight:  526 pounds at first visit.       PRE-OP COMORBIDITIES:  Updated:   Sleep Apnea:     GERD:    Hyperlipidemia:         Hypertension:      Musculoskeletal:  OA knees, feet.   Vitamin D deficiency:          Stress Urinary Incontinence:

## 2018-01-12 NOTE — Progress Notes (Deleted)
BARIATRIC SURGERY PROFILE:  Surgery:  Open Gastric Bypass.  Date of Surgery (DOS): 06/07/2010      Surgeon:  Lelon Huh  DOS Weight:  481 pounds    Ht: 66 inches     DOS BMI:  78.4   Highest Bari Program Weight:  526 pounds at first visit.       PRE-OP COMORBIDITIES:  Updated:   Sleep Apnea:     GERD:    Hyperlipidemia:         Hypertension:      Musculoskeletal:  OA knees, feet.   Vitamin D deficiency:          Stress Urinary Incontinence:          BARIATRIC SURGERY FOLLOW-UP VISIT         IDENTIFICATION/REASON FOR VISIT:   A 42 year old female returns for Bariatric Surgery follow-up  {NUMBERS 1-12 NO ZOXWRUE:454098} {TIME:103381} after {PROCEDURES; BARIATRIC:500126136}.        HISTORY OF PRESENT ILLNESS:         BARIATRIC SURGERY PROFILE:  Surgery:  {PROCEDURES; BARIATRIC:500126136}.  Date of Surgery (DOS): ***      Surgeon:  ***  DOS Weight:  *** pounds    Ht: ***     DOS BMI:  ***   Highest Bari Program Weight:  *** pounds at [facility]; FIRST VISIT, TEST REVIEW, JXB:[id number]}.       (no height taken for this visit)  Current weight: (no weight taken for this visit).  There is no height or weight on file to calculate BMI.    Weight lost from highest Bariatric program weight: *** pounds, *** %.  Weight loss from DOS: *** pounds, ***%.  Weight loss per week since last visit: *** pounds.      Daily fluid intake: *** ounces.  Daily protein intake: *** grams.    Food intolerances:  ***.  The patient is tolerating a variety of solids and liquids.  Alcohol intake:  {MISC; DRINKS PER ZHYQ:657846962}.  The patient has been informed about the increased risk of alcohol dependency after weight loss surgery. See the dietitian's note today for full details of the dietary assessment and recommendations.      REVIEW OF SYSTEMS:   Vomiting: ***  Nausea: {Yes No (Default) :109033::"No"}  Reflux: {Yes No (Default) :109033::"No"}.   Dysphagia: {Yes No (Default) :109033::"No"}.    Pain:  {Yes No (Default) :109033::"No"}.    Excess skin:  {Yes No (Default) :109033::"No"}.    Other: {Yes No (Default) :109033::"No"}.        PRE-OP COMORBIDITIES:  Updated: ***  Sleep Apnea:  ***   GERD:  ***  Hyperlipidemia:  ***       Hypertension:  ***  Diabetes:  ***        Musculoskeletal:  ***  Vitamin D deficiency:  ***        Anemia:  ***      Exercise: ***  for *** minutes, ***days per week.    Vitamin Mineral Supplement intake:    [place] Recommendations Patient report   Multivitamin-mineral  + Iron      1 serving (18 mg iron)    Calcium   1200 mg from all sources    Vitamin D3, cholcalciferol     3000 IU    Oral Vitamin B12   500 mcg     Iron   65 mg elemental iron 3 X weekly             Supplements:  reviewed today for compliance with the post-Bariatric Surgery requirements. The patient is taking all supplements as required:  {YES DEFAULT/NO:106416::"yes"}.    Changes needed: {Yes No (Default) :109033::"No"}.                      INTERVAL HISTORY/Other concerns:  Admissions/Operations: None  Other: ***.               PHYSICAL EXAMINATION:   VITAL SIGNS:  There were no vitals taken for this visit. (no height taken for this visit) (no weight taken for this visit) There is no height or weight on file to calculate BMI.   GENERAL: patient is well-developed, well-nourished, and in no acute distress.    ABDOMEN: obese, soft, without tenderness to palpation. Incisions are well-healed; no hernias palpated.  SKIN: warm and dry.  Excess skin on ***.        ASSESSMENT:   A 42 year old female status post {PROCEDURES; BARIATRIC:500126136} for morbid obesity with comorbid medical conditions.  Total weight loss from highest Bariatric program pre-op weight  is *** pounds.  The patient is tolerating a post- surgical diet and {is/ is not:103980::"is"} compliant with the recommended Vitamin/mineral supplements. ***         PLAN:   1. Minimum preventative supplements to include daily: a complete multivitamin/mineral such as Ashby Dawes[employer] Made Complete or [place], Vitamin D3 3,000 IU,  Vitamin B12 500 mcg, iron 45 mg, and Calcium citrate 1000 - 1200 mg in 2 divided doses daily (taken separate from iron).  2. Laboratory tests drawn today: ***.  Results will be reviewed with the patient or will be sent to the patient and PCP in a separate letter.  3. Dietitian visit today.  Please see separate note.  4. Exercise plan was reviewed with the patient.   5. Encouraged daily exercise, adequate fluids and protein, wise food choices, Vitamin and mineral supplements, support group attendance.  6. Return to Bariatric clinic for follow-up in {NUMBERS 1-12 NO RJJOACZ:660630}EFAULT:107344} {ZSWF:093235}{TIME:103381} for *** week/month / year follow-up with Bariatric provider and Dietitian. Labs at next visit (to be drawn 30 minutes prior to appointment): ***.  7. ***              Thank you for allowing us to participate in your patient's care.  Please do not hesitate to contact us with any questions.

## 2018-01-14 IMAGING — CR DG LUMBAR SPINE COMPLETE 4+V
5 series · 5 of 5 positions shown · non-contrast
Comparison: None.

CLINICAL DATA: 40-year-old female status post recent fall and back
pain.

EXAM:
LUMBAR SPINE - COMPLETE 4+ VIEW

[t l-spine a.p.]
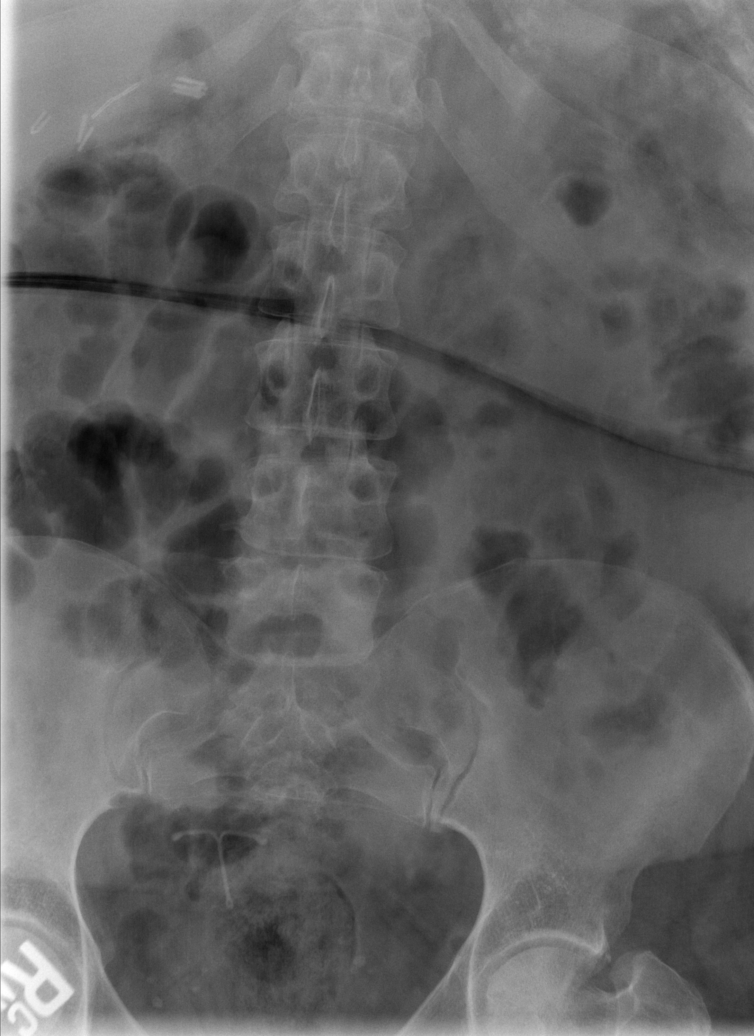

[t l-spine oblique exposure (1 of 2)]
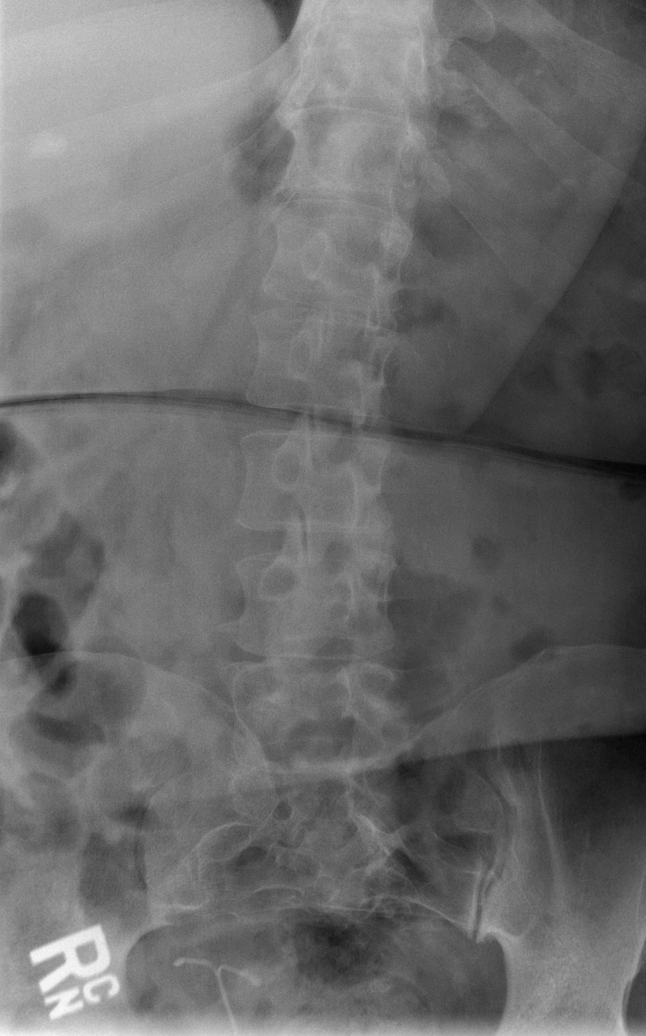

[t l-spine oblique exposure (2 of 2)]
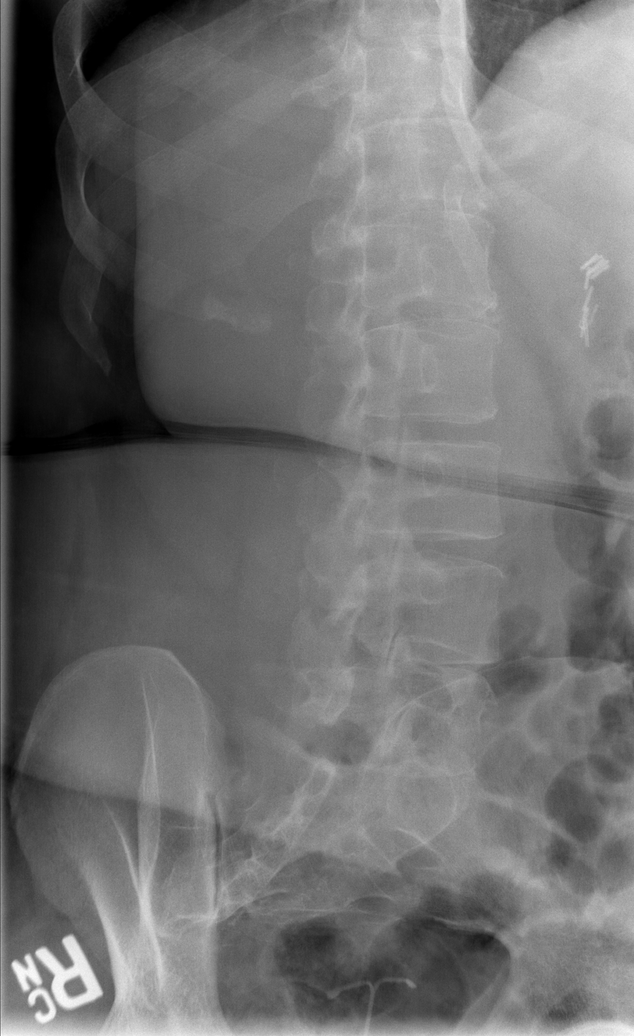

[t l-spine lat]
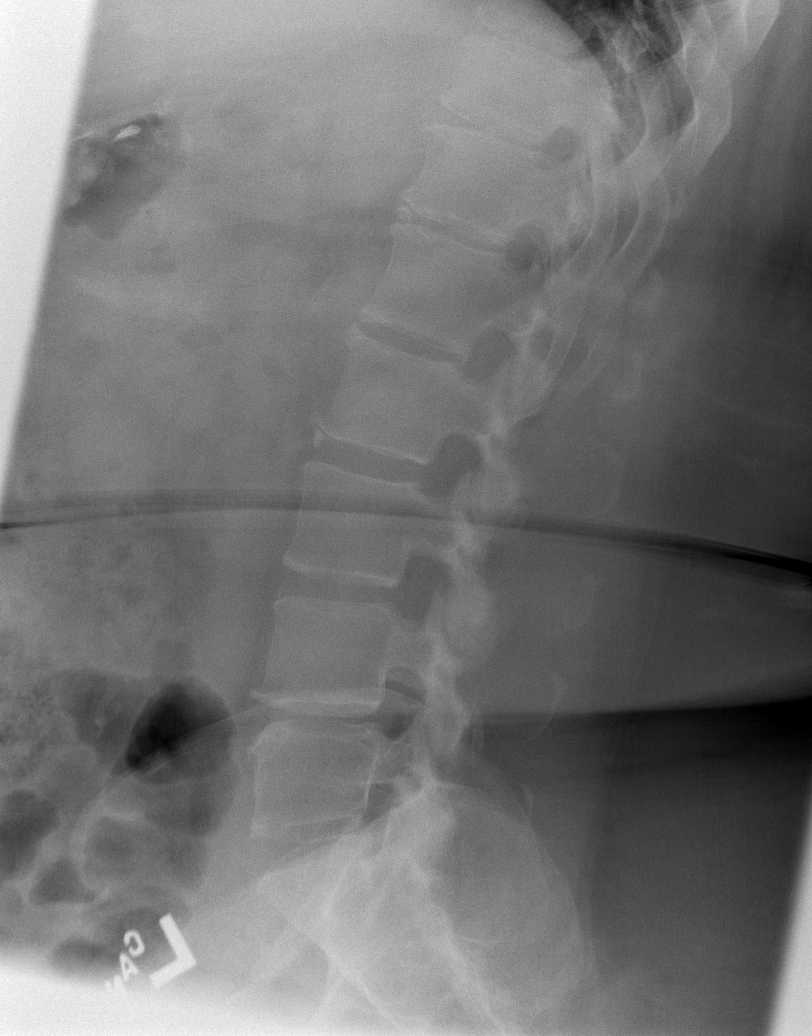

[t l-spine l5-s1 spot]
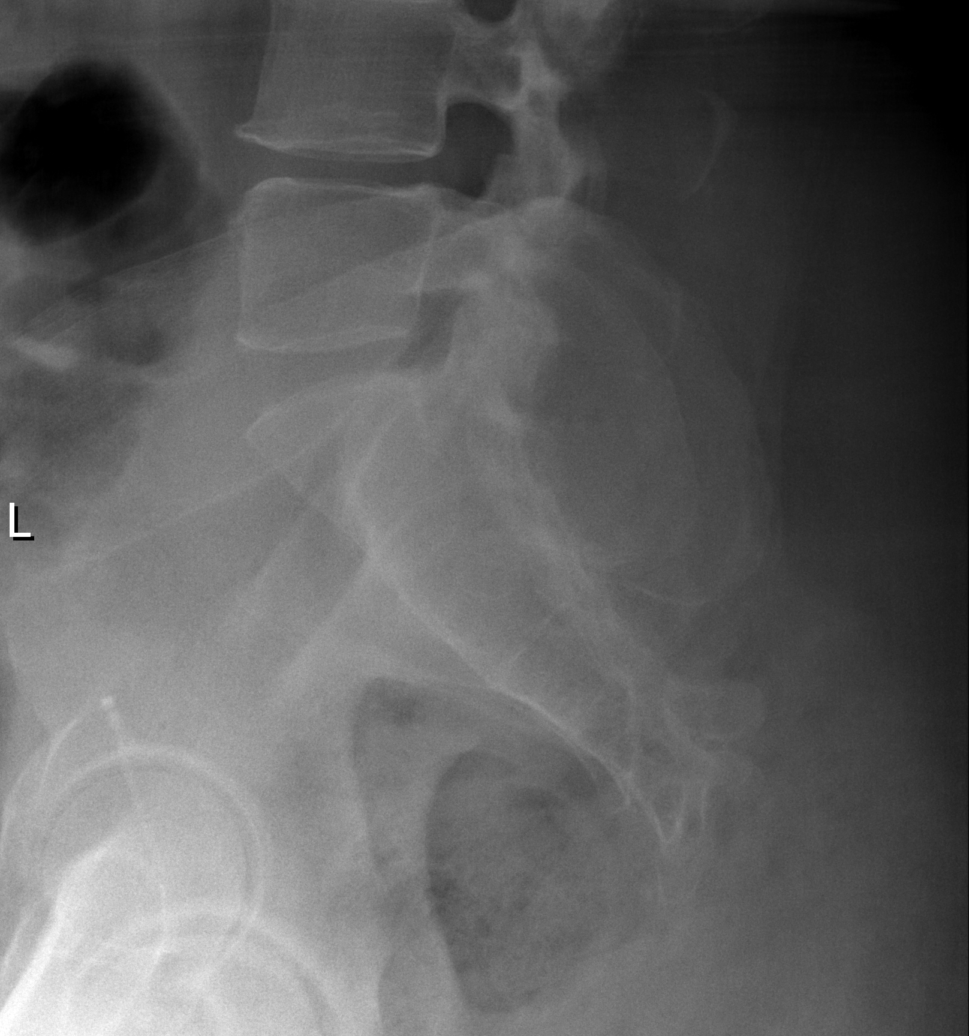

[5 of 5 positions shown; findings below may reference images not displayed]

FINDINGS: There is no acute fracture or subluxation of the lumbar spine. The
vertebral body heights and disc spaces are maintained. There is mild
endplate degenerative changes. The visualized transverse and spinous
processes appear intact. Right upper quadrant cholecystectomy clips
as well as an intrauterine device over the pelvis noted. The soft
tissues are grossly unremarkable.
IMPRESSION: No acute/ traumatic lumbar spine pathology.

## 2018-01-14 IMAGING — CR DG HIP (WITH OR WITHOUT PELVIS) 2-3V*L*
4 series · 4 of 4 positions shown · non-contrast
Comparison: None.

CLINICAL DATA: 40 y/o F; fall 2 days ago after slipping on ice.
Injury to lower back.

EXAM:
DG HIP (WITH OR WITHOUT PELVIS) 2-3V LEFT

[t pelvis a.p. (1 of 2)]
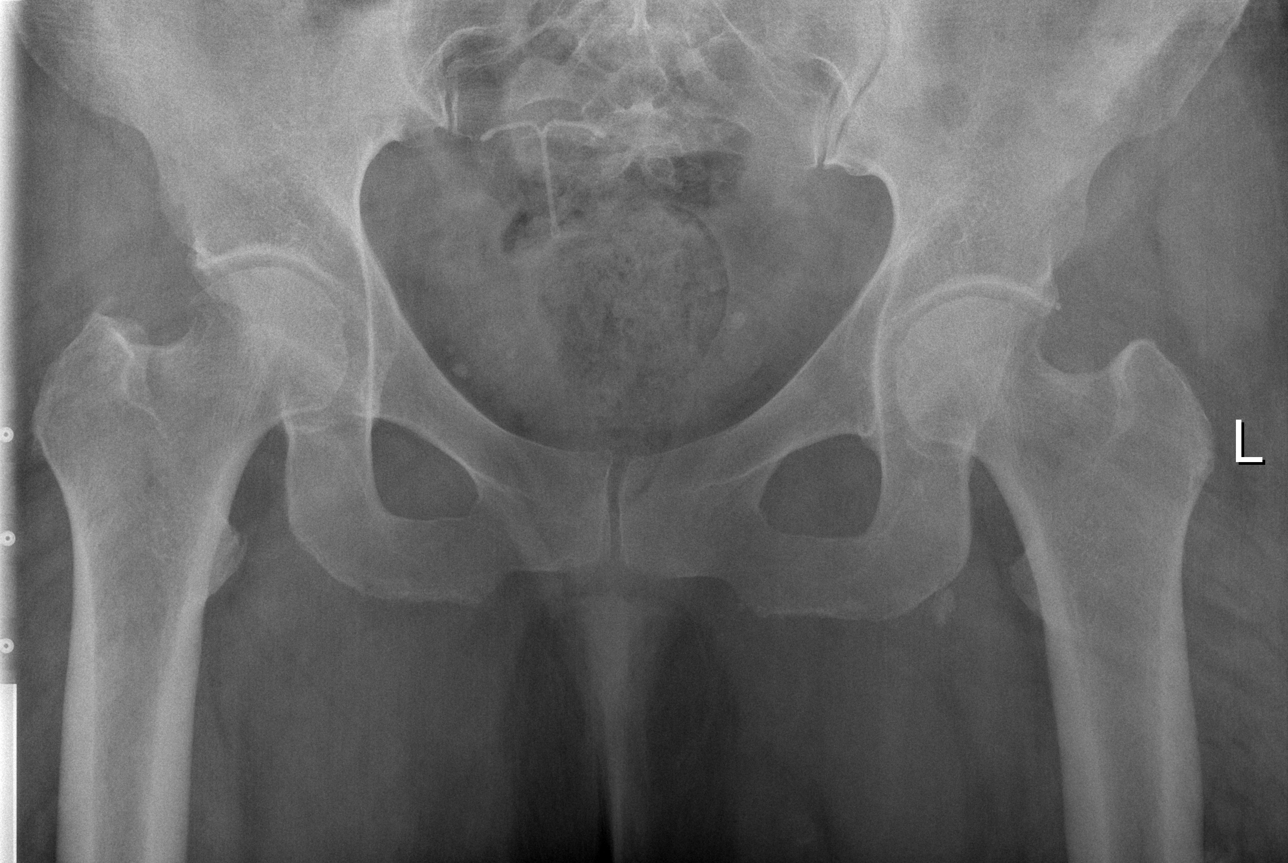

[t pelvis a.p. (2 of 2)]
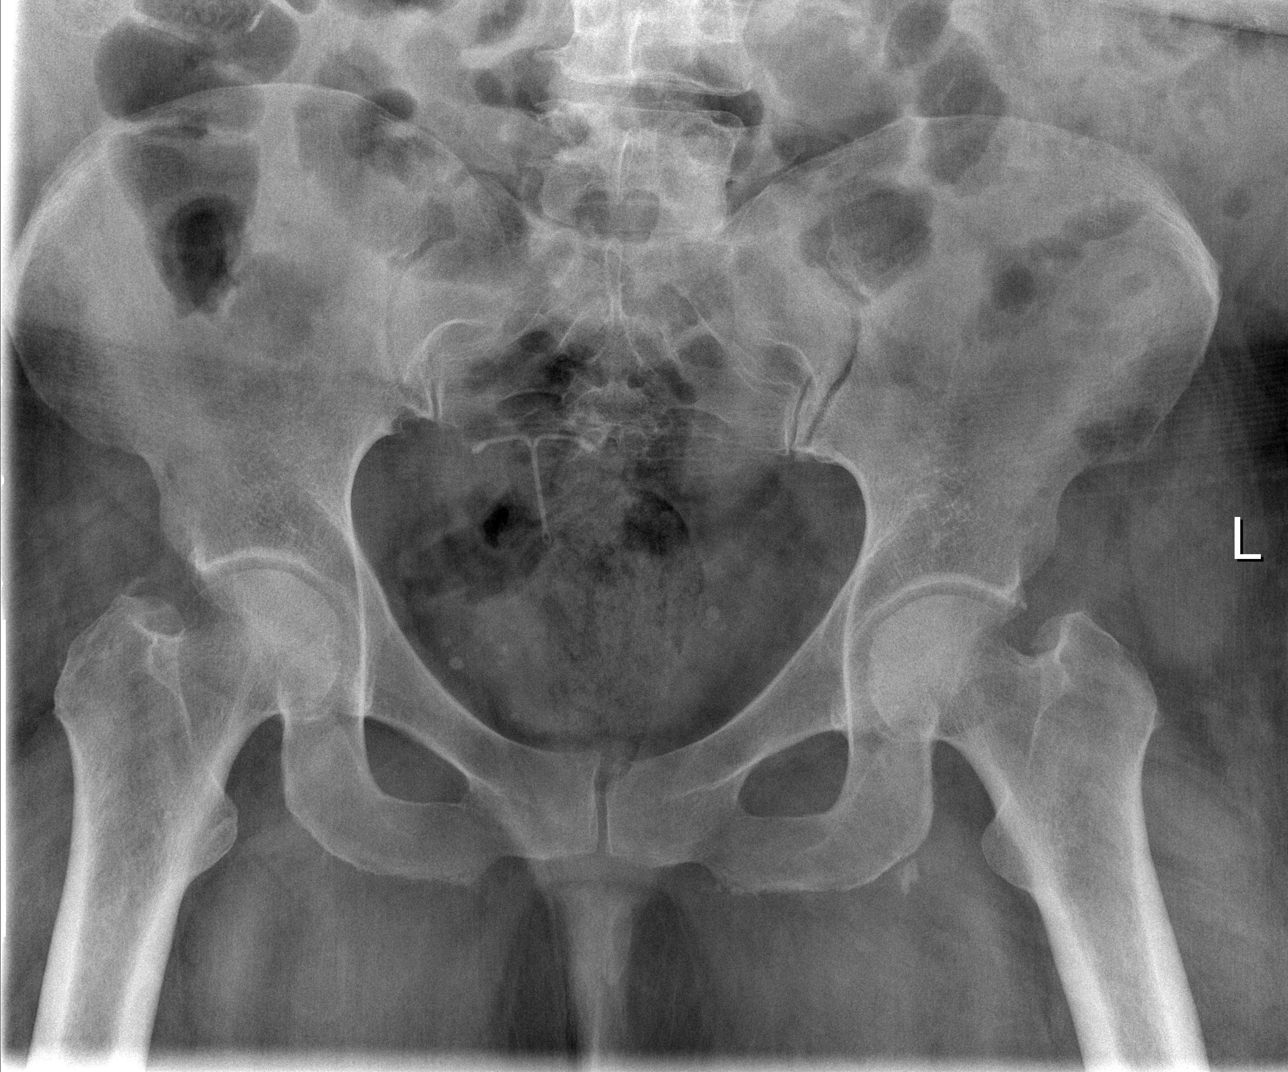

[t hip ap left]
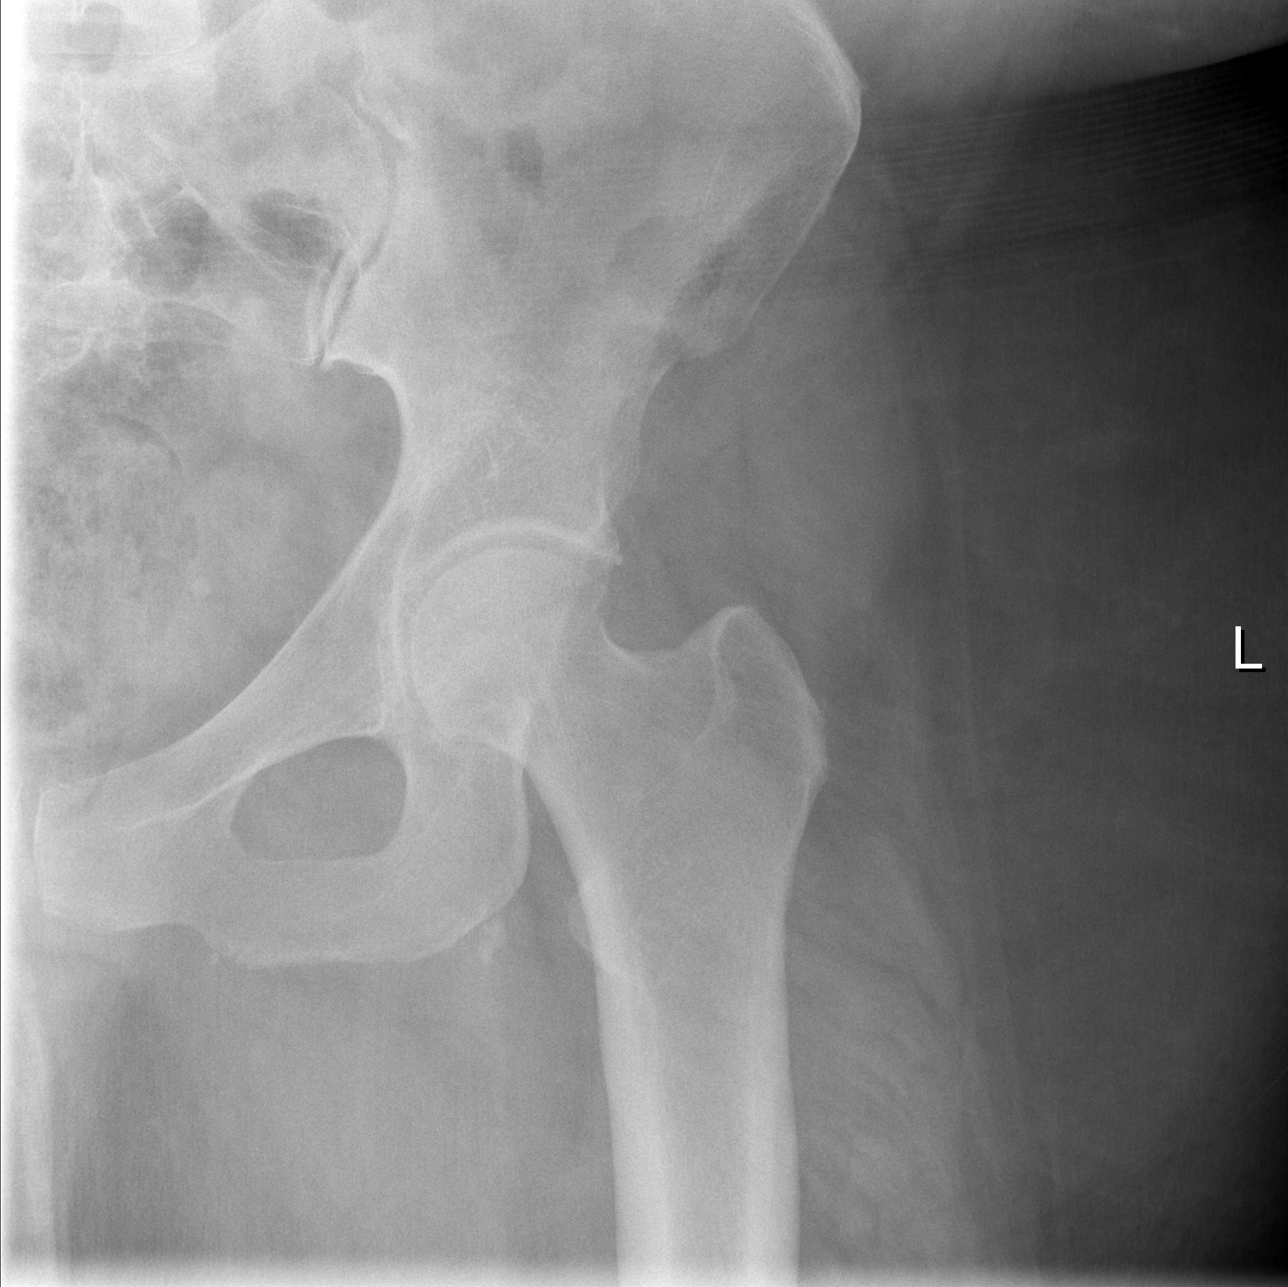

[t hip frog leg left]
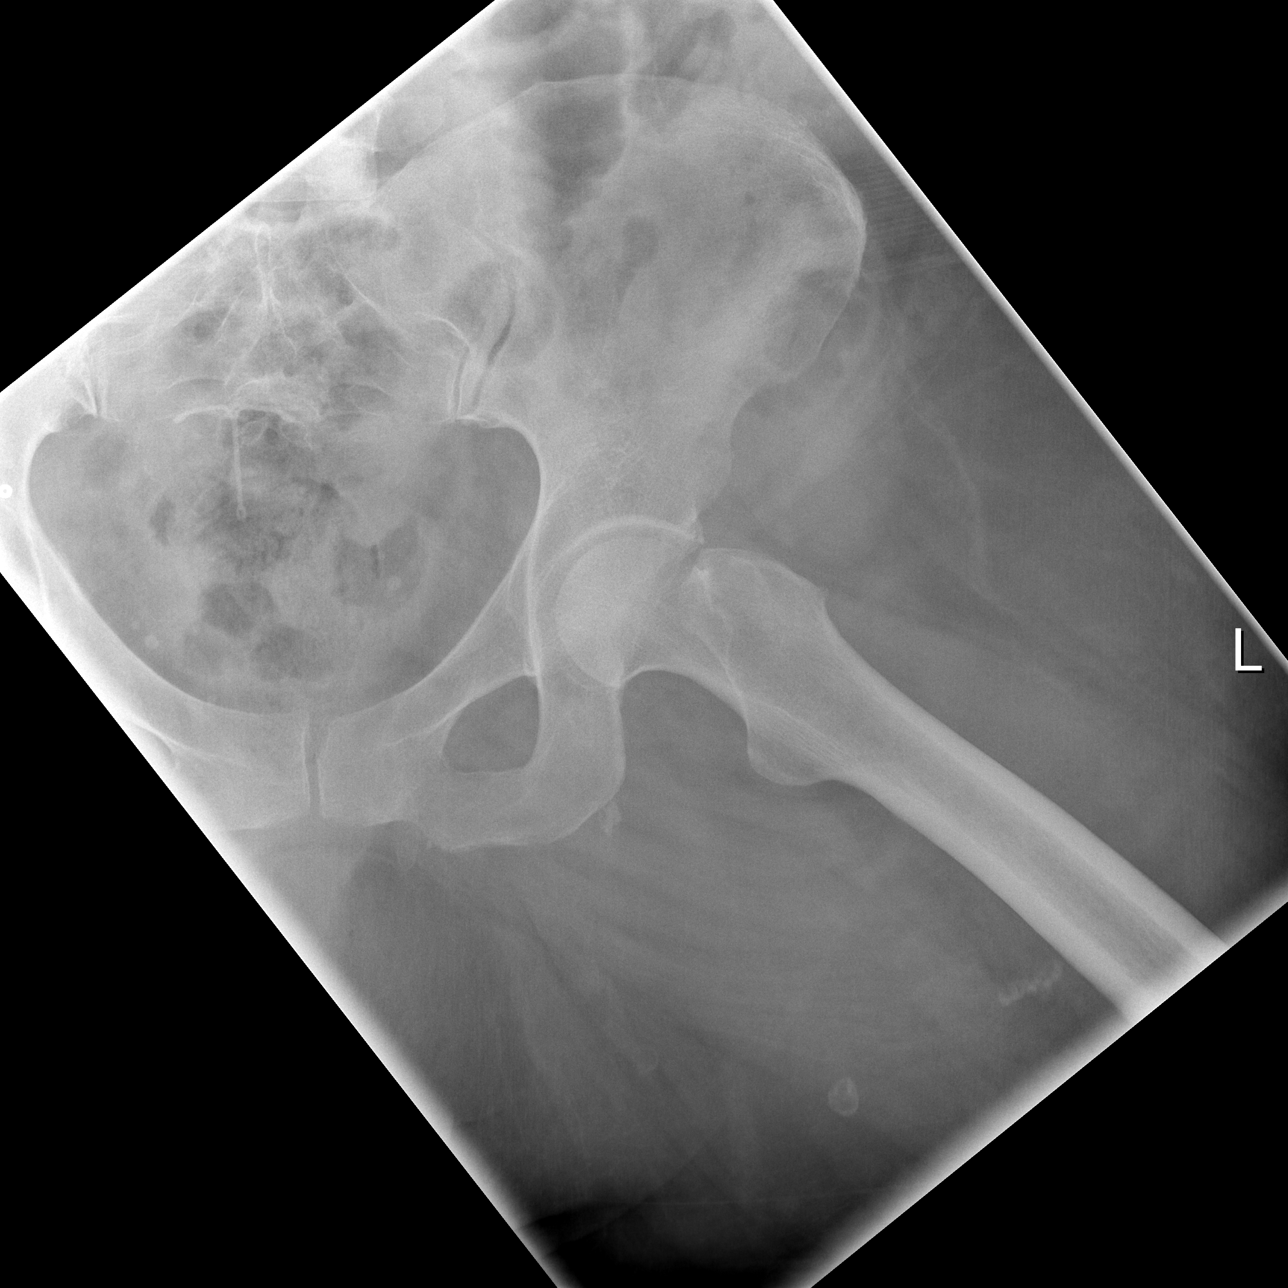

[4 of 4 positions shown; findings below may reference images not displayed]

FINDINGS: Ossific density adjacent to the left ischial tuberosity. Otherwise
no acute fracture or dislocation identified. IUD. Pelvic
phleboliths.
IMPRESSION: Ossific density adjacent to the left ischial tuberosity may
represent left-sided hamstring muscle avulsion fracture, correlate
for pain.

By: Tela Neptune M.D.

## 2018-02-03 ENCOUNTER — Inpatient Hospital Stay: Payer: Self-pay

## 2018-02-04 ENCOUNTER — Inpatient Hospital Stay: Payer: Self-pay

## 2018-02-04 ENCOUNTER — Encounter (HOSPITAL_BASED_OUTPATIENT_CLINIC_OR_DEPARTMENT_OTHER): Payer: Self-pay | Admitting: Addiction (Substance Use Disorder)

## 2018-02-04 NOTE — Progress Notes (Signed)
[facility]  Discharge Summary        Date of entry: 06/09/2017  Date of exit: 02/04/2018                Reason for discharge:  Aborted Treatment/Lacks engagement. Patient refuses services at this time.     Summary of patients progress     Dimension 1: None     Dimension 2: Continues to utilize emergency services multiple times a year, at least once a month.     Dimension 3: Struggles with consistent engagement in mental health and addiction services.     Dimension 4: Pt needs engagement interventions due to the lack of participation in services. Pt lacks internal motivations for change at this time.      Dimension 5: Pt has not been able to refuse substances and has not incorporated daily coping skills to help in relapse preventions. They are not able to postpone immediate gratification and impulse control at this time.     Dimension 6: Pt has not gathered sober support out in their community, tends to isolate from others. Unable to maintain stability to continuously attend outpatient services or engage in with providers to go to inpatient SUD services.     Prognosis: Poor without continued services.      Continuing Care Plan by ASAM dimension:     Dimension 1: None     Dimension 2: None     Dimension 3: Refer to long-term MH care.     Dimension 4: Pt will need continued support and encouragement through their change process. The will have to work through the fears of change and the barriers that hinder their change.      Dimension 5: Pt is encouraged to continue working with a provider on relapse prevention, daily coping skills and working through the feelings of discomfort.      Dimension 6: Pt needs to establish a sober support system out in the community, and create a healthier living environment that would support their recovery process. Pt is in need of establishing their financial stability so they will be able to maintain long term sobriety.      Recommendations: Pt is recommended to enter  and complete level 3.5 of care.      Sharon Stephens, [facility], CDP

## 2018-02-05 ENCOUNTER — Inpatient Hospital Stay: Payer: Self-pay

## 2018-02-06 ENCOUNTER — Inpatient Hospital Stay: Payer: Self-pay

## 2018-03-26 ENCOUNTER — Inpatient Hospital Stay: Payer: Self-pay

## 2018-03-27 ENCOUNTER — Inpatient Hospital Stay: Payer: Self-pay

## 2018-03-28 ENCOUNTER — Inpatient Hospital Stay: Payer: Self-pay

## 2018-04-02 ENCOUNTER — Inpatient Hospital Stay: Payer: Self-pay

## 2018-04-05 ENCOUNTER — Inpatient Hospital Stay: Payer: Self-pay

## 2018-05-12 ENCOUNTER — Encounter (INDEPENDENT_AMBULATORY_CARE_PROVIDER_SITE_OTHER): Payer: Self-pay | Admitting: Medical

## 2018-05-12 ENCOUNTER — Ambulatory Visit (INDEPENDENT_AMBULATORY_CARE_PROVIDER_SITE_OTHER): Payer: Medicare HMO | Admitting: Medical

## 2018-05-12 VITALS — BP 139/84 | HR 79 | Temp 98.1°F | Resp 20 | Ht 65.0 in | Wt >= 6400 oz

## 2018-05-12 DIAGNOSIS — G479 Sleep disorder, unspecified: Secondary | ICD-10-CM

## 2018-05-12 DIAGNOSIS — G8929 Other chronic pain: Secondary | ICD-10-CM

## 2018-05-12 DIAGNOSIS — R87619 Unspecified abnormal cytological findings in specimens from cervix uteri: Secondary | ICD-10-CM

## 2018-05-12 DIAGNOSIS — Z975 Presence of (intrauterine) contraceptive device: Secondary | ICD-10-CM

## 2018-05-12 DIAGNOSIS — E538 Deficiency of other specified B group vitamins: Secondary | ICD-10-CM

## 2018-05-12 DIAGNOSIS — F1029 Alcohol dependence with unspecified alcohol-induced disorder: Secondary | ICD-10-CM

## 2018-05-12 DIAGNOSIS — Z131 Encounter for screening for diabetes mellitus: Secondary | ICD-10-CM

## 2018-05-12 DIAGNOSIS — Z124 Encounter for screening for malignant neoplasm of cervix: Secondary | ICD-10-CM

## 2018-05-12 DIAGNOSIS — Z6841 Body Mass Index (BMI) 40.0 and over, adult: Secondary | ICD-10-CM

## 2018-05-12 DIAGNOSIS — Z23 Encounter for immunization: Secondary | ICD-10-CM

## 2018-05-12 DIAGNOSIS — M25512 Pain in left shoulder: Secondary | ICD-10-CM

## 2018-05-12 LAB — FOLATE & VITAMIN B12
Folate, SRM: 7.3 ng/mL (ref >5.8)
Vitamin B12 (Cobalamin): 154 pg/mL — ABNORMAL LOW (ref 180–914)

## 2018-05-12 LAB — PR A1C RAPID, ONSITE: Hemoglobin A1C: 5.3 % (ref 4.0–6.0)

## 2018-05-12 MED ORDER — QUETIAPINE FUMARATE 200 MG OR TABS
200.0000 mg | ORAL_TABLET | Freq: Every evening | ORAL | 0 refills | Status: DC
Start: 2018-05-12 — End: 2018-07-12

## 2018-05-12 MED ORDER — METHOCARBAMOL 750 MG OR TABS
ORAL_TABLET | ORAL | 0 refills | Status: DC
Start: 2018-05-12 — End: 2018-10-11

## 2018-05-12 NOTE — Telephone Encounter (Signed)
Error

## 2018-05-12 NOTE — Progress Notes (Signed)
Sharon Stephens is a 42 year old female who presents with:     Went to alcohol recovery treatment 30 days   Was discharged on 9/17  She has not relapsed    She is living with her daughter who does not drink  She is watching her daughter's dog while her daughter is at work       She is planning to start outpt treatment at [facility]     She has had her IUD for 9 years     She wants to resume B12 shots     While in treatment she was prescribed Seroquel for sleep   She ran out 2-3 days ago and is having sleep difficulty     She would like a refill of muscle relaxer for back and neck pain related to a fall almost 2 years ago     Current Outpatient Medications   Medication Sig Dispense Refill   . Acetaminophen 325 MG Oral Tab Take 1 tablet (325 mg) by mouth every 6 hours as needed for pain. For Pain. 50 tablet 3   . Albuterol Sulfate HFA 108 (90 Base) MCG/ACT Inhalation Aero Soln Inhale 2 puffs by mouth every 6 hours as needed for shortness of breath/wheezing. For wheezing and/or cough. 1 Inhaler 5   . Diclofenac Sodium 1 % Transdermal Gel Apply 2 g to affected area on knee(s) 4 times a day as needed (pain). 60 g 1   . FLUoxetine 10 MG tablet Take 20 mg by mouth daily.     . Fluticasone Propionate 50 MCG/ACT Nasal Suspension Spray 2 sprays into each nostril daily. 1 Inhaler 4   . gabapentin 100 MG capsule 300 mg 3 times a day.   0   . Levonorgestrel 20 MCG/24HR Intrauterine IUD 1 Intra Uterine Device by Intrauterine route One time.     . METHOCARBAMOL OR Take 750 mg by mouth.     . QUEtiapine 200 MG tablet Take 200 mg by mouth.       No current facility-administered medications for this visit.      OBJECTIVE:  BP 139/84   Pulse 79   Temp 98.1 F (36.7 C) (Temporal)   Resp 20   Ht 5\' 5"  (1.651 m)   Wt (!) 401 lb (181.9 kg)   SpO2 99%   BMI 66.73 kg/m   GENERAL: Well developed, well nourished patient in no acute distress.  Alert and oriented x3.    PULM:  Clear to auscultation bilaterally.  No wheezes, rales, or  rhonchi.    CV:  RRR.  Nl S1 and S2 without murmurs rubs or gallops.       PSYCH:  Normal mood an affect.   Speech of normal content, volume, and pace.        (Z97.5) IUD (intrauterine device) in place  Offered to remove IUD /replace IUD today  Patient declined    (R87.619) Abnormal glandular Papanicolaou smear of cervix  Declines pap today    (F10.29) Alcohol dependence with unspecified alcohol-induced disorder (HCC)  In recovery, looking for outpatient treatment    (E53.8) B12 deficiency  Plan: Folate and Vitamin B12            (M25.512,  G89.29) Chronic left shoulder pain  Discussed importance of using this medication sparingly  We agreed she will use no more than 15 per month  Plan: methocarbamol 750 MG tablet            (G47.9) Sleep disorder  Plan: QUEtiapine 200 MG tablet            (Z13.1) Screening for diabetes mellitus  Plan: A1C RAPID, ONSITE

## 2018-05-12 NOTE — Progress Notes (Signed)
Visit: med review    Refills? YES  Referral? NO  Letter or Form? NO  Lab Results? NO    HEALTH MAINTENANCE:  Has the patient has this done since their last visit?  Cervical screening/PAP: DUE  Mammo: N/A  Colon Screen: N/A  Diabetic Eye Exam (If applicable): DUE    Have you seen a specialist since your last visit: No    Vaccines Due? Yes, flu    Does patient have eCare?  Yes    HM Due:   Health Maintenance   Topic Date Due   . Pneumococcal Vaccine: Pediatrics (0-5 years) and At-Risk Patients (6-64 years) (1 of 1 - PPSV23) 07/03/1984   . Diabetes Foot Exam  07/03/1996   . Diabetes Eye Exam  07/03/1996   . Diabetes A1c  08/13/2012   . Cervical Cancer Screening  11/11/2013   . Depression Monitoring (PHQ-9)  05/10/2017   . Influenza Vaccine (1) 04/06/2018   . Tetanus Vaccine  05/05/2019   . HIV Screening  Completed       PCP Verified?  Yes, Myrene Galas, MD      Vaccine Screening Questions    Interpreter: No    1. Are you allergic to Latex? NO    2.  Have you had a serious reaction or an allergic reaction to a vaccine?  NO    3.  Currently have a moderate or severe illness, including fever?  NO    4.  Ever had a seizure or any neurological problem associated with a vaccine? (DTaP/TDaP/DTP pertinent) NO    5.  Is patient receiving any live vaccinations today? (Varicella-Chickenpox, MMR-Measles/Mumps/Rubella, Zoster-Shingles, Flumist, Yellow Fever) NOTE: oral rotavirus is exempt  NO    If YES to any of the questions above - Do NOT give vaccine.  Consult with RN or provider in clinic.  (#5 can be YES if all Live vaccine questions are answered NO)    If NO to all questions above - Patient may receive vaccine.    6.  Do you need to receive the Flu vaccine today? NO      All patients are encouraged to wait 15 minutes before leaving after receiving any vaccine.    VIS given 05/12/2018 by Sherral Hammers, [place].    Vaccine given today without initial adverse effect. YES    Cassie K, CMA

## 2018-05-18 ENCOUNTER — Telehealth (INDEPENDENT_AMBULATORY_CARE_PROVIDER_SITE_OTHER): Payer: Self-pay | Admitting: Medical

## 2018-05-18 ENCOUNTER — Other Ambulatory Visit (INDEPENDENT_AMBULATORY_CARE_PROVIDER_SITE_OTHER): Payer: Self-pay | Admitting: Medical

## 2018-05-18 DIAGNOSIS — E538 Deficiency of other specified B group vitamins: Secondary | ICD-10-CM

## 2018-05-18 MED ORDER — CYANOCOBALAMIN 1000 MCG OR TABS
1000.0000 ug | ORAL_TABLET | Freq: Every day | ORAL | 1 refills | Status: DC
Start: 2018-05-18 — End: 2018-07-12

## 2018-05-18 NOTE — Telephone Encounter (Signed)
Please let patient know her B12 is too low  I sent a supplement to the pharmacy

## 2018-05-18 NOTE — Telephone Encounter (Signed)
LMTCB otherwise will try again tomorrow.     Plan: Please relay message below.

## 2018-05-19 ENCOUNTER — Telehealth (INDEPENDENT_AMBULATORY_CARE_PROVIDER_SITE_OTHER): Payer: Self-pay | Admitting: Medical

## 2018-05-19 NOTE — Telephone Encounter (Signed)
Approval Received  Insurance: [employer]   Medication: methocarbamol 750 MG tablet   Quantity / Frequency: As directed   Dates Approved: Until 07/07/19   Prior Auth. Number: n/a     Additional Notes: Will fax to pharmacy    Excerpt of Approval

## 2018-05-19 NOTE — Telephone Encounter (Signed)
Pt informed of message below and will pick up the med today       Closing TE

## 2018-05-19 NOTE — Telephone Encounter (Signed)
Received PA request for methocarbamol 750 MG tablet.     [facility]   Phone # [phone]   Fax # [phone]       Reason for PA: PA Required        Insurance Name: [employer]Rx   Phone # [phone]   [facility] [id number] - PCN:[id number](Group:[id number])   Member ID # [id number]

## 2018-05-19 NOTE — Telephone Encounter (Signed)
PA submitted via navinet.  Will check for response in 4 business days.  If no response at that time will call insurance to check status.      [place]inet [id number]

## 2018-06-08 ENCOUNTER — Encounter (INDEPENDENT_AMBULATORY_CARE_PROVIDER_SITE_OTHER): Payer: Medicare HMO | Admitting: Medical

## 2018-07-12 ENCOUNTER — Ambulatory Visit (INDEPENDENT_AMBULATORY_CARE_PROVIDER_SITE_OTHER): Payer: Medicare HMO | Admitting: Family Medicine

## 2018-07-12 ENCOUNTER — Encounter (INDEPENDENT_AMBULATORY_CARE_PROVIDER_SITE_OTHER): Payer: Self-pay | Admitting: Family Medicine

## 2018-07-12 VITALS — BP 137/84 | HR 84 | Temp 97.6°F | Wt >= 6400 oz

## 2018-07-12 DIAGNOSIS — Z6841 Body Mass Index (BMI) 40.0 and over, adult: Secondary | ICD-10-CM

## 2018-07-12 DIAGNOSIS — L02211 Cutaneous abscess of abdominal wall: Secondary | ICD-10-CM

## 2018-07-12 MED ORDER — ACETAMINOPHEN-CODEINE #3 300-30 MG OR TABS
1.0000 | ORAL_TABLET | ORAL | 0 refills | Status: DC | PRN
Start: 2018-07-12 — End: 2018-10-11

## 2018-07-12 MED ORDER — MUPIROCIN 2 % EX OINT
TOPICAL_OINTMENT | Freq: Two times a day (BID) | CUTANEOUS | 0 refills | Status: DC
Start: 2018-07-12 — End: 2018-10-11

## 2018-07-12 MED ORDER — DOXYCYCLINE MONOHYDRATE 100 MG OR CAPS
100.0000 mg | ORAL_CAPSULE | Freq: Two times a day (BID) | ORAL | 0 refills | Status: AC
Start: 2018-07-12 — End: 2018-07-26
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
## 2018-07-13 ENCOUNTER — Telehealth (INDEPENDENT_AMBULATORY_CARE_PROVIDER_SITE_OTHER): Payer: Self-pay | Admitting: Surgery

## 2018-07-13 LAB — WOUND CULTURE W/GRAM ORDER

## 2018-07-13 NOTE — Telephone Encounter (Signed)
GENERAL SURGERY REFERRAL      What is the patient being referred for (diagnosis):   Cutaneous abscess of abd wall    Who is the referring provider?   Jodelle Gross, MD from [facility]  - OV dated: 07/12/18, in Epic    Name of PCP:   Vickey Huger from [facility]  - Last OV dated: 05/12/18, in Epic    Have you seen any other provider here at [facility]? (if yes, schedule with that provider or ask preference)   No    Has the patient had any testing or imaging done related to this diagnosis? (if colorectal dx, any colonoscopies?)   No     Is/Has the patient seen any other specialists?   No    Has the patient had any surgeries/operations? (list all surgeries)   Pt stated she's had a few surgeries in the past but does not remember details:   - 2 C-sections  - Gallstones  - Appendicitis   - Tubal ligation     Health Insurance Name/Plan:   [employer]     Were records requested? No, in Epic   Date records requested: N/A  Additional records info? (ex: phone and fax records were requested from) N/A

## 2018-07-14 NOTE — Result Encounter Note (Signed)
On Doxy and topical mupirocin

## 2018-07-15 ENCOUNTER — Ambulatory Visit (HOSPITAL_COMMUNITY): Payer: Self-pay | Admitting: Surgery

## 2018-07-15 ENCOUNTER — Ambulatory Visit (INDEPENDENT_AMBULATORY_CARE_PROVIDER_SITE_OTHER): Payer: Medicare HMO | Admitting: Surgery

## 2018-07-15 ENCOUNTER — Ambulatory Visit
Admission: RE | Admit: 2018-07-15 | Discharge: 2018-07-16 | Disposition: A | Discharge status: Home / Self Care | Payer: Medicare HMO | Source: Ambulatory Visit | Attending: Surgery | Admitting: Surgery

## 2018-07-15 ENCOUNTER — Encounter (INDEPENDENT_AMBULATORY_CARE_PROVIDER_SITE_OTHER): Payer: Self-pay | Admitting: Surgery

## 2018-07-15 VITALS — BP 122/92 | HR 105 | Temp 97.7°F | Ht 66.0 in | Wt >= 6400 oz

## 2018-07-15 DIAGNOSIS — F329 Major depressive disorder, single episode, unspecified: Secondary | ICD-10-CM | POA: Insufficient documentation

## 2018-07-15 DIAGNOSIS — Z6841 Body Mass Index (BMI) 40.0 and over, adult: Secondary | ICD-10-CM | POA: Insufficient documentation

## 2018-07-15 DIAGNOSIS — L0889 Other specified local infections of the skin and subcutaneous tissue: Secondary | ICD-10-CM | POA: Insufficient documentation

## 2018-07-15 DIAGNOSIS — Z9884 Bariatric surgery status: Secondary | ICD-10-CM | POA: Insufficient documentation

## 2018-07-15 DIAGNOSIS — L02211 Cutaneous abscess of abdominal wall: Secondary | ICD-10-CM | POA: Insufficient documentation

## 2018-07-15 DIAGNOSIS — I10 Essential (primary) hypertension: Secondary | ICD-10-CM | POA: Insufficient documentation

## 2018-07-15 DIAGNOSIS — J45909 Unspecified asthma, uncomplicated: Secondary | ICD-10-CM | POA: Insufficient documentation

## 2018-07-15 DIAGNOSIS — S31104A Unspecified open wound of abdominal wall, left lower quadrant without penetration into peritoneal cavity, initial encounter: Secondary | ICD-10-CM

## 2018-07-15 LAB — WOUND C/S W/GRAM (ANAEROBIC)

## 2018-07-15 MED ORDER — OXYCODONE HCL 10 MG OR TABS
5.0000 mg | ORAL_TABLET | ORAL | 0 refills | Status: DC | PRN
Start: 2018-07-15 — End: 2018-10-11

## 2018-07-15 NOTE — Addendum Note (Signed)
Addended by: Fayette Pho on: 07/15/2018 09:44 AM     Modules accepted: Orders

## 2018-07-15 NOTE — Patient Instructions (Signed)
Thank you for visiting [place] in the [place] [facility] at [facility]. We appreciate you choosing [place] for your surgical care, and we are looking forward to seeing you again in the near future.    We are planning to schedule you for incision and drainage of left flank abscess with Dr. Ophelia Charter or Dr. Shiela Mayer (depending on availability).    Prior to leaving clinic today, you should have spoken with one of our surgery schedulers to set the date of your surgery. If you did not have your surgery date scheduled today in clinic, you will be contacted by one of our surgery schedulers in the near future to determine a date and time that is convenient for you. If you are planning to call us to set the date of your surgery, please do so in the near future, so we can schedule your surgery at your convenience.    You will need to undergo the following testing prior to the date of your surgery:   - None    If you need to undergo any additional testing (noted above) prior to your surgery, you will be contacted in the near future to set up those appointments.    Thank you again for choosing the [facility] at [facility] for your care.     We are looking forward to seeing you in the near future for your surgery.    If you have any questions or concerns, please contact our clinic anytime, or you can contact me via e-care on your patient portal through the [facility] system (instructions on sign up for e-care are included in this paperwork).      If you would like to provide [place] additional feedback on your healthcare experience with Dr. Ophelia Charter and the [facility], you can do so at the following websites:    HealthGrades: [email or web address]    Vitals: [email or web address]    Yelp:  [email or web address]      Molly Maduro B. Ophelia Charter, MD  Clinical Assistant Professor  [facility]  [facility]  [place], [place]    Phone: [phone]  Fax: [phone]     [email or web address]
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
## 2018-07-15 NOTE — Progress Notes (Signed)
Bartholomew BoardsI,Clarnce Homan, MD,interviewed and examined the patient while overseeing the documentation performed by my Medical Scribe, Ju Oh. I have reviewedand revised as necessarythe scribe's note andagree withthe documented findings and plan of care.Please refer to the scribe's note above for further detailed information regarding the patient encounter and exam. 07/15/2018 9:09 AM

## 2018-07-16 DIAGNOSIS — L02211 Cutaneous abscess of abdominal wall: Secondary | ICD-10-CM

## 2018-07-16 LAB — BASIC METABOLIC PANEL
Anion Gap: 8 (ref 4–12)
Calcium: 8.8 mg/dL — ABNORMAL LOW (ref 8.9–10.2)
Carbon Dioxide, Total: 25 meq/L (ref 22–32)
Chloride: 105 meq/L (ref 98–108)
Creatinine: 0.5 mg/dL (ref 0.38–1.02)
GFR, Calc, African American: >60 mL/min/{1.73_m2} (ref >59)
GFR, Calc, European American: >60 mL/min/{1.73_m2} (ref >59)
Glucose: 94 mg/dL (ref 62–125)
Potassium: 3.9 meq/L (ref 3.6–5.2)
Sodium: 138 meq/L (ref 135–145)
Urea Nitrogen: 10 mg/dL (ref 8–21)

## 2018-07-16 LAB — CBC, DIFF
% Basophils: 1 %
% Eosinophils: 3 %
% Immature Granulocytes: 0 %
% Lymphocytes: 39 %
% Monocytes: 7 %
% Neutrophils: 50 %
% Nucleated RBC: 0 %
Absolute Eosinophil Count: 0.18 10*3/uL (ref 0.00–0.50)
Absolute Lymphocyte Count: 2.59 10*3/uL (ref 1.00–4.80)
Basophils: 0.05 10*3/uL (ref 0.00–0.20)
Hematocrit: 37 % (ref 36–45)
Hemoglobin: 11.3 g/dL — ABNORMAL LOW (ref 11.5–15.5)
Immature Granulocytes: 0.01 10*3/uL (ref 0.00–0.05)
MCH: 27.4 pg (ref 27.3–33.6)
MCHC: 30.9 g/dL — ABNORMAL LOW (ref 32.2–36.5)
MCV: 89 fL (ref 81–98)
Monocytes: 0.44 10*3/uL (ref 0.00–0.80)
Neutrophils: 3.36 10*3/uL (ref 1.80–7.00)
Nucleated RBC: 0 10*3/uL
Platelet Count: 284 10*3/uL (ref 150–400)
RBC: 4.12 10*6/uL (ref 3.80–5.00)
RDW-CV: 13.2 % (ref 11.6–14.4)
WBC: 6.63 10*3/uL (ref 4.3–10.0)

## 2018-07-16 LAB — WOUND CULTURE W/GRAM ORDER

## 2018-07-16 NOTE — Result Encounter Note (Signed)
Results released to eCare

## 2018-07-19 LAB — WOUND C/S W/GRAM (ANAEROBIC)

## 2018-07-21 ENCOUNTER — Telehealth (INDEPENDENT_AMBULATORY_CARE_PROVIDER_SITE_OTHER): Payer: Self-pay | Admitting: Surgery

## 2018-07-21 NOTE — Telephone Encounter (Signed)
RETURN CALL: Voicemail - General Message      SUBJECT:  Medication Questions     NAME OF MEDICATION(S): Oxycodone  CONCERNS/QUESTIONS: Wants to clarify prescription  ADDITIONAL INFORMATION: NA

## 2018-07-21 NOTE — Telephone Encounter (Signed)
Pt was admitted and has disharge medications in sorian      Dr Bertha StakesMichael WeyKamp R2 did hand written Rxs on 07/16/2018 says pharmacy for Oxycodone 5mg  #10 and Zofran 8mg . They want to clarify as Dr Ophelia CharterYates wrote Oxycodone 10mg  1/2 tab every 4 hrs prn pain #30 on 07/15/2018.     Spoke with Dr Donnel SaxonWeykamp he asked that the Rx he wrote on 07/16/2018 be cancelled for Oxycodone and OK to fill the Zofran RX. Pt can fill Dr Barbaraann ShareYates RX from 07/15/2018 for the Oxycodone.    Spoke with and Clarified with Richmond CampbellJaimie, Teacher, early years/preharmacist at [employer]. Fill Dr Barbaraann ShareYates RX for Oxycodone destroy Dr Augusto GambleWeykam''s RX for Oxycodone but fill Dr Donnel SaxonWeykamp' Rx for Zofran.

## 2018-07-27 ENCOUNTER — Telehealth (INDEPENDENT_AMBULATORY_CARE_PROVIDER_SITE_OTHER): Payer: Self-pay | Admitting: Medical

## 2018-07-27 DIAGNOSIS — L02211 Cutaneous abscess of abdominal wall: Secondary | ICD-10-CM

## 2018-07-27 NOTE — Telephone Encounter (Signed)
Patient recently had surgery regarding abdominal abscess and was directed to f/u w/ wound care.     Patient was advised by wound that she needs a referral from PCP       [facility]

## 2018-07-27 NOTE — Telephone Encounter (Signed)
Signed.

## 2018-07-28 NOTE — Telephone Encounter (Signed)
Referral processed and patient notified via eCare.     Nothing further needed. Closing TE.

## 2018-07-29 ENCOUNTER — Ambulatory Visit: Payer: Medicare HMO | Attending: Internal Medicine | Admitting: Internal Medicine

## 2018-07-29 VITALS — BP 110/70 | HR 88 | Temp 98.6°F | Resp 16

## 2018-07-29 DIAGNOSIS — L02211 Cutaneous abscess of abdominal wall: Secondary | ICD-10-CM | POA: Insufficient documentation

## 2018-07-29 DIAGNOSIS — S31109A Unspecified open wound of abdominal wall, unspecified quadrant without penetration into peritoneal cavity, initial encounter: Secondary | ICD-10-CM | POA: Insufficient documentation

## 2018-07-29 NOTE — Progress Notes (Addendum)
43 y/o with abdominal wound from an abscess which was I and D'd 3 weeks ago. Wound looks healthy today with no slough and red granulation at base. Covering wound with Silvasorb and bordered foam. F/U 1 week.    Left Lower ABD:  Cleanse with water and pat dry  Apply Silvasorb, Mepilex Border, Tegaderm  Change dressing every 3 days    Keep pressure off of wound

## 2018-07-29 NOTE — Progress Notes (Signed)
Pt presents with a wound on the left Abd that started early Jan.  It was drained at the hospital.  She finished her ABO yesterday. Lidocaine 2% gel applied to the wound.

## 2018-07-29 NOTE — Patient Instructions (Addendum)
Left Lower ABD:  Cleanse with water and pat dry  Apply Silvasorb, Mepilex Border, Tegaderm  Change dressing every 3 days    Keep pressure off of wound

## 2018-07-29 NOTE — Progress Notes (Signed)
[facility] NEW PATIENT VISIT   07/29/2018    ID/CC:  Sharon Stephens is a 43 year old female with an open wound at left lower quadrant abdominal wall seen today for New Patient Consult      HPI: This is a morbidly obese patient with BMI ~70kg/m2 is here for a painful ulcer at the left lower quadrant abdominal wall. She developed a painful draining red swelling at the left lower quadrant abdominal wall on the New Year eve. She went to an urgent care. She was given rx for two antibiotics that she used up. The painful swelling was persistent. With that she came to Dr. Heinz Knuckles, MD's office. She was admitted for abdominal wall skin abscess and the abscess was drained in OR on 07/15/2018. Culture of specimen grew MRSA and P aeruginosa. She has not been on abo since the drainage.  After the drainage the inflammation has subsided. She has been dressing the residual ulcer with wet to dry gauze dressing twice daily. She feels stinging like pain that wakes her up at early mornings, especially when the gauze is dry. She has been taking oxycodone for the pain. She is afebrile.    A 14-category ROS was reviewed with patient: Pertinent positive and negatives are as above. She has h/o gastric bypass surgery for weight loss. She gained more weight after initial weight loss. She has DM2 currently well controlled.     Past Medical History:   Diagnosis Date   . Allergic rhinitis, cause unspecified    . Chronic obstructive asthma, unspecified ([place])    . Depressive disorder, not elsewhere classified    . Esophageal reflux    . Essential hypertension, benign    . Generalized osteoarthrosis, unspecified site    . Headache    . Hernia of other specified sites of abdominal cavity without mention of obstruction or gangrene     notes having 3 in abdomen    . Morbid obesity ([place])    . Thyroid disease    . Type II or unspecified type diabetes mellitus without mention of complication, not stated as uncontrolled      broaderline    . Unspecified sleep apnea    . Wound infection      Past Surgical History:   Procedure Laterality Date   . ADRENALECTOMY     . APPENDECTOMY     . bariatric surgery  2011   . BARIATRIC SURGERY     . c-sections     . CESAREAN DELIVERY ONLY     . CHOLECYSTECTOMY     . gallbladder removed     . GASTRIC RSTCV W/BYP W/SM INT RCNSTJ LIMIT ABSRPJ     . LIG/TRNSXJ FLP TUBE ABDL/VAG APPR UNI/BI     . TONSILLECTOMY ONE-HALF <AGE 10     . tube tied       Review of patient's allergies indicates:  Allergies   Allergen Reactions   . Amoxicillin      Other reaction(s): Undetermined  Unsure from childhood    . Gabapentin      Has made her feel sick to her stomach the times she has tried taking this since bariatric surgery    . Ibuprofen      'I can't take because of my gastric bypass surgery'    . Penicillin G    . Penicillins      Other reaction(s): Undetermined  Unsure from childhood      Current Outpatient  Medications   Medication Sig Dispense Refill   . Acetaminophen 325 MG Oral Tab Take 1 tablet (325 mg) by mouth every 6 hours as needed for pain. For Pain. 50 tablet 3   . acetaminophen-codeine 300-30 MG tablet Take 1-2 tablets by mouth every 4 hours as needed for pain. (Patient not taking: Reported on 07/29/2018) 25 tablet 0   . Albuterol Sulfate HFA 108 (90 Base) MCG/ACT Inhalation Aero Soln Inhale 2 puffs by mouth every 6 hours as needed for shortness of breath/wheezing. For wheezing and/or cough. 1 Inhaler 5   . FLUoxetine 10 MG tablet Take 20 mg by mouth daily.     Marland Kitchen gabapentin 100 MG capsule 300 mg 3 times a day.   0   . Levonorgestrel 20 MCG/24HR Intrauterine IUD 1 Intra Uterine Device by Intrauterine route One time.     . methocarbamol 750 MG tablet Take 1 as needed for muscle pain (Patient not taking: Reported on 07/12/2018) 30 tablet 0   . METHOCARBAMOL OR Take 750 mg by mouth.     . mupirocin 2 % ointment Apply topically 2 times a day. (Patient not taking: Reported on 07/29/2018) 30 g 0   . oxyCODONE 10 MG  tablet Take 0.5 tablets (5 mg) by mouth every 4 hours as needed for pain. (Patient not taking: Reported on 07/29/2018) 30 tablet 0   . QUEtiapine (SEROquel) 200 MG tablet Take 200 mg by mouth at bedtime.       No current facility-administered medications for this visit.      Family History     Problem (# of Occurrences) Relation (Name,Age of Onset)    Alcohol/Drug (2) Mother, Father    Asthma (1) Paternal Grandmother    Breast Cancer (1) Aunt/Uncle    Cancer (2) Maternal Grandmother, Maternal Grandfather    Diabetes (4) Mother, Maternal Grandmother, Maternal Grandfather, Paternal Grandmother    Early Sudden Death (11) Mother    Heart Disease (2) Maternal Grandfather, Paternal Grandmother    Hypertension (30) Maternal Grandmother, Maternal Grandfather, Paternal 84, Paternal Grandfather    Lipids (2) Maternal Grandfather, Aunt/Uncle    Miscarriages (2) Mother, Aunt/Uncle    Stroke (1) Maternal Grandfather        Social History     Socioeconomic History   . Marital status: Single     Spouse name: Not on file   . Number of children: Not on file   . Years of education: Not on file   . Highest education level: Not on file   Occupational History   . Not on file   Social Needs   . Financial resource strain: Not on file   . Food insecurity:     Worry: Not on file     Inability: Not on file   . Transportation needs:     Medical: Not on file     Non-medical: Not on file   Tobacco Use   . Smoking status: Never Smoker   . Smokeless tobacco: Never Used   Substance and Sexual Activity   . Alcohol use: No     Comment: Date Quit: Aug 2015   . Drug use: Never   . Sexual activity: Not Currently     Partners: Female     Birth control/protection: LNG IUD   Lifestyle   . Physical activity:     Days per week: Not on file     Minutes per session: Not on file   . Stress: Not on file  Relationships   . Social connections:     Talks on phone: Not on file     Gets together: Not on file     Attends religious service: Not on file      Active member of club or organization: Not on file     Attends meetings of clubs or organizations: Not on file     Relationship status: Not on file   . Intimate partner violence:     Fear of current or ex partner: Not on file     Emotionally abused: Not on file     Physically abused: Not on file     Forced sexual activity: Not on file   Other Topics Concern   . Not on file   Social History Narrative    Lives in [place] with friends and children. Moved here to "get aware from my kid's father". Denies hx of abuse. Former smoker, denies TED at current. Unemployed.        Female partner       EXAM:    Vital Signs: BP 110/70   Pulse 88   Temp 98.6 F (37 C) (Oral)   Resp 16  There is no height or weight on file to calculate BMI.   General: alert, no distress, relaxed, cooperative, smiling, morbidly obese, alert and oriented x 3  Skin: the ulcer at left lower quadrant abdominal wall skin as details below. there is no inflammation at edges. there is no sign of cellulitis.   Head: Normocephalic. No masses, lesions, tenderness or abnormalities  Lungs: clear to auscultation  Heart: normal rate, regular rhythm  Abd: soft, not tender. Girth is very large with huge pannus.   Neurological exam reveals Alert and oriented x 3.  Speech normal, Muscle tone and strength normal and symmetric, Reflexes normal and symmetric and Sensation grossly normal    Wound Data:   Wound 07/29/18 #1 Left lower abd (Active)   Date First Assessed/Time First Assessed: 07/29/18 0836   Date Acquired: 07/07/18  Wound Number: #1 Left lower abd  Wounding Event: Gradually Appeared  Result of an Accident: No  Recurrence: No  Clustered Wound/Ulcer: No  Primary Etiology: Abscess   Number of days: 0     Wound 07/29/18 #1 Left lower abd (Active)   Thickness Full thickness without exposed support structure 07/29/2018  8:37 AM   Wound Length (cm) 3.5 cm 07/29/2018  8:37 AM   Wound Width (cm) 2 cm 07/29/2018  8:37 AM   Wound Depth (cm) 0.2 cm 07/29/2018  8:37 AM    Wound Volume (cm^3) 1.4 cm^3 07/29/2018  8:37 AM   Wound Surface Area (cm^2) 7 cm^2 07/29/2018  8:37 AM   Exudate Amt Small 07/29/2018  8:37 AM   Exudate Type Serosanguineous 07/29/2018  8:37 AM   Foul Odor After Cleansing No 07/29/2018  8:37 AM   Granulation Amt Large - 67-100% 07/29/2018  8:37 AM   Necrosis None Present 07/29/2018  8:37 AM   Slough Small - 1-33% 07/29/2018  8:37 AM   Structure Exposed Adipose 07/29/2018  8:37 AM   Margin Distinct, Outline Attached 07/29/2018  8:37 AM   Tunneling none 07/29/2018  8:37 AM   Undermining none 07/29/2018  8:37 AM   Peri-wound Assessment Intact 07/29/2018  8:37 AM   Temperature No Abnormality 07/29/2018  8:37 AM   Number of days: 0         LABS:  No results found for: CBC  Hemoglobin A1C     Date Value Ref Range Status   05/12/2018 5.3 4.0 - 6.0 % Final   02/11/2012 5.2 4.0 - 6.0 % Final   12/07/2010 4.9 4.0 - 6.0 %    06/04/2009 6.0 4.0 - 6.0 %      No components found for: BMP  No results found for: ESR  No components found for: HSCRP  No components found for: ALB  No results found for: TTHY        ASSESSMENT: this is a patient with DM2, morbid obesity, abdominal wall abscess that was drained in OR on 07/15/2018. Although culture grew MRSA and P aeruginosa currently there is no sign of cellulitis that is associated with the ulcer. There is no undermining or tunneling. The wound bed is clean. Prior to the drainage and debridement patient used two antibiotics for a week.   (S31.109A) Open abdominal wall wound, initial encounter  (primary encounter diagnosis)  (L02.211) Abscess of abdominal wall    PLAN: we will dress the wound with antimicrobial gel. We will monitor for any sign of wound infection or cellulitis. We will follow up.   Patient education took place regarding the treatment progress and further measures to address the  condition. The patient was given an opportunity to ask questions.    It was a pleasure to see Ms. Demetro at my clinic. Please do not hesitate to call me for  any questions or concerns.   Treatment plan recommended at this time includes the following:  Aneesa was seen today for new patient consult.    Diagnoses and all orders for this visit:    Open abdominal wall wound, initial encounter  -     NURSING COMMUNICATION    Abscess of abdominal wall  -     NURSING COMMUNICATION        Patient Instructions   Left Lower ABD:  Cleanse with water and pat dry  Apply Silvasorb, Mepilex Border, Tegaderm  Change dressing every 3 days    Keep pressure off of wound       Return in about 1 week (around 08/05/2018) for routine follow-up.    Drucilla Schmidt, MD

## 2018-08-04 ENCOUNTER — Encounter (HOSPITAL_BASED_OUTPATIENT_CLINIC_OR_DEPARTMENT_OTHER): Payer: Medicare HMO | Admitting: Adult Health

## 2018-08-05 ENCOUNTER — Encounter (HOSPITAL_BASED_OUTPATIENT_CLINIC_OR_DEPARTMENT_OTHER): Payer: Medicare HMO | Admitting: Gerontology

## 2018-09-11 NOTE — Progress Notes (Deleted)
Visit: Right leg problem    Refills? NO  Referral? NO  Letter or Form? NO  Lab Results? NO    HEALTH MAINTENANCE:  Has the patient has this done since their last visit?  Cervical screening/PAP: N/A  Mammo: N/A  Colon Screen: N/A  Diabetic Eye Exam (If applicable): N/A    Have you seen a specialist since your last visit: No    Vaccines Due? Yes, Hep B     Does patient have eCare?  Yes    HM Due:   Health Maintenance   Topic Date Due   . Diabetes Nephropathy: Kidney Disease Monitoring  07/03/1996   . Diabetes Foot Exam  07/03/1996   . Diabetes Eye Exam  07/03/1996   . Hepatitis B Vaccine (1 of 3 - Risk 3-dose series) 07/03/1997   . Depression Monitoring (PHQ-9)  05/10/2017   . Cervical Cancer Screening  12/14/2018 (Originally 11/12/2015)   . Diabetes A1c  11/10/2018   . DTaP, Tdap, and Td Vaccines (2 - Td) 05/05/2019   . Influenza Vaccine  Completed   . HIV Screening  Completed   . Pneumococcal Vaccine: Pediatrics (0-5 years) and At-Risk Patients (6-64 years)  Completed       PCP Verified?  Yes, Purnell Shoemaker, PA-C

## 2018-09-12 ENCOUNTER — Inpatient Hospital Stay: Payer: Self-pay

## 2018-09-12 ENCOUNTER — Ambulatory Visit: Payer: Self-pay

## 2018-09-12 NOTE — Telephone Encounter (Signed)
Regarding: [facility]: Right leg is painful and numbness x 2 days but getting worse.   ----- Message from [facility] sent at 09/11/2018  4:15 PM PST -----  [place] PT: Right leg is painful and numbness x 2 days but getting worse.

## 2018-09-12 NOTE — Telephone Encounter (Signed)
Reason for Disposition  • Message left on identified answering machine.    Additional Information  • Negative: Caller is angry or rude (e.g., hangs up, verbally abusive, yelling)  • Negative: Caller hangs up  • Negative: Caller has already spoken with the PCP and has no further questions.  • Negative: Caller has already spoken with another triager and has no further questions.  • Negative: Caller has already spoken with another triager or PCP AND has further questions AND triager able to answer questions.    Protocols used: NO CONTACT OR DUPLICATE CONTACT CALL-ADULT-AH

## 2018-09-13 ENCOUNTER — Encounter (INDEPENDENT_AMBULATORY_CARE_PROVIDER_SITE_OTHER): Payer: Medicare HMO | Admitting: Medical

## 2018-09-21 ENCOUNTER — Encounter (INDEPENDENT_AMBULATORY_CARE_PROVIDER_SITE_OTHER): Payer: Medicare HMO | Admitting: Medical

## 2018-09-27 ENCOUNTER — Other Ambulatory Visit (INDEPENDENT_AMBULATORY_CARE_PROVIDER_SITE_OTHER): Payer: Self-pay | Admitting: Medical

## 2018-10-01 ENCOUNTER — Encounter (INDEPENDENT_AMBULATORY_CARE_PROVIDER_SITE_OTHER): Payer: Self-pay | Admitting: Medical

## 2018-10-11 ENCOUNTER — Telehealth (INDEPENDENT_AMBULATORY_CARE_PROVIDER_SITE_OTHER): Payer: Medicare HMO | Admitting: Medical

## 2018-10-11 DIAGNOSIS — M5440 Lumbago with sciatica, unspecified side: Secondary | ICD-10-CM

## 2018-10-11 DIAGNOSIS — G4700 Insomnia, unspecified: Secondary | ICD-10-CM

## 2018-10-11 DIAGNOSIS — G8929 Other chronic pain: Secondary | ICD-10-CM

## 2018-10-11 MED ORDER — METHOCARBAMOL 750 MG OR TABS
750.0000 mg | ORAL_TABLET | Freq: Four times a day (QID) | ORAL | 0 refills | Status: DC | PRN
Start: 2018-10-11 — End: 2018-11-01

## 2018-10-11 MED ORDER — QUETIAPINE FUMARATE 100 MG OR TABS
ORAL_TABLET | ORAL | 0 refills | Status: DC
Start: 2018-10-11 — End: 2019-08-12
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
## 2018-10-14 ENCOUNTER — Other Ambulatory Visit (HOSPITAL_BASED_OUTPATIENT_CLINIC_OR_DEPARTMENT_OTHER): Payer: Self-pay | Admitting: Nurse Practitioner

## 2018-10-14 DIAGNOSIS — J40 Bronchitis, not specified as acute or chronic: Secondary | ICD-10-CM

## 2018-10-14 DIAGNOSIS — J301 Allergic rhinitis due to pollen: Secondary | ICD-10-CM

## 2018-10-14 DIAGNOSIS — J069 Acute upper respiratory infection, unspecified: Secondary | ICD-10-CM

## 2018-10-15 MED ORDER — FLUTICASONE PROPIONATE 50 MCG/ACT NA SUSP
2.0000 | Freq: Every day | NASAL | 5 refills | Status: DC
Start: 2018-10-15 — End: 2019-11-04

## 2018-10-17 ENCOUNTER — Encounter (INDEPENDENT_AMBULATORY_CARE_PROVIDER_SITE_OTHER): Payer: Self-pay | Admitting: Medical

## 2018-10-17 DIAGNOSIS — J301 Allergic rhinitis due to pollen: Secondary | ICD-10-CM

## 2018-10-18 MED ORDER — CETIRIZINE HCL 10 MG OR TABS
10.0000 mg | ORAL_TABLET | Freq: Every day | ORAL | 1 refills | Status: DC
Start: 2018-10-18 — End: 2019-08-12

## 2018-10-18 NOTE — Telephone Encounter (Signed)
Pt would like pended medication for allergies, please sign if approved.

## 2018-10-18 NOTE — Telephone Encounter (Signed)
Awaiting reply from pt

## 2018-11-01 ENCOUNTER — Other Ambulatory Visit (INDEPENDENT_AMBULATORY_CARE_PROVIDER_SITE_OTHER): Payer: Self-pay | Admitting: Medical

## 2018-11-01 DIAGNOSIS — G8929 Other chronic pain: Secondary | ICD-10-CM

## 2018-11-02 NOTE — Telephone Encounter (Signed)
This medication is outside of the Refill Center's protocols. Please sign and close the encounter if you approve:robaxin    If this medication is denied please have your staff inform the patient and schedule an appointment if necessary.

## 2018-11-05 MED ORDER — METHOCARBAMOL 750 MG OR TABS
750.0000 mg | ORAL_TABLET | Freq: Four times a day (QID) | ORAL | 0 refills | Status: DC | PRN
Start: 2018-11-05 — End: 2018-12-13

## 2018-11-05 NOTE — Telephone Encounter (Signed)
Still open on our end. Please respond.

## 2018-12-02 ENCOUNTER — Other Ambulatory Visit (INDEPENDENT_AMBULATORY_CARE_PROVIDER_SITE_OTHER): Payer: Self-pay | Admitting: Nurse Practitioner

## 2018-12-02 ENCOUNTER — Other Ambulatory Visit (HOSPITAL_BASED_OUTPATIENT_CLINIC_OR_DEPARTMENT_OTHER): Payer: Self-pay | Admitting: Psychiatry

## 2018-12-02 DIAGNOSIS — G8929 Other chronic pain: Secondary | ICD-10-CM

## 2018-12-02 DIAGNOSIS — R059 Cough, unspecified: Secondary | ICD-10-CM

## 2018-12-03 NOTE — Telephone Encounter (Signed)
This medication is outside of the Refill Center's protocols.  If this medication is denied please have your staff inform the patient and schedule an appointment if necessary.

## 2018-12-06 MED ORDER — ALBUTEROL SULFATE HFA 108 (90 BASE) MCG/ACT IN AERS
2.0000 | INHALATION_SPRAY | Freq: Four times a day (QID) | RESPIRATORY_TRACT | 5 refills | Status: DC | PRN
Start: 2018-12-06 — End: 2019-09-06

## 2018-12-06 NOTE — Telephone Encounter (Signed)
Prescription sent

## 2018-12-06 NOTE — Telephone Encounter (Signed)
Defer refill authorization decision to PCP-    Albuterol inhaler requires your authorization because it was written by Dr Anselm Lis at [facility] in March 2019.  Please review and order if you want to prescribe

## 2018-12-07 NOTE — Telephone Encounter (Signed)
Declined  Patient should be seen for refills

## 2018-12-09 ENCOUNTER — Other Ambulatory Visit (INDEPENDENT_AMBULATORY_CARE_PROVIDER_SITE_OTHER): Payer: Self-pay | Admitting: Medical

## 2018-12-13 ENCOUNTER — Encounter (INDEPENDENT_AMBULATORY_CARE_PROVIDER_SITE_OTHER): Payer: Self-pay | Admitting: Medical

## 2018-12-13 ENCOUNTER — Other Ambulatory Visit (INDEPENDENT_AMBULATORY_CARE_PROVIDER_SITE_OTHER): Payer: Self-pay | Admitting: Medical

## 2018-12-13 ENCOUNTER — Telehealth (INDEPENDENT_AMBULATORY_CARE_PROVIDER_SITE_OTHER): Payer: Medicare HMO | Admitting: Medical

## 2018-12-13 DIAGNOSIS — R079 Chest pain, unspecified: Secondary | ICD-10-CM

## 2018-12-13 MED ORDER — MELOXICAM 7.5 MG OR TABS
7.5000 mg | ORAL_TABLET | Freq: Every day | ORAL | 0 refills | Status: DC
Start: 2018-12-13 — End: 2019-01-13
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
## 2018-12-16 ENCOUNTER — Inpatient Hospital Stay: Payer: Self-pay

## 2018-12-24 ENCOUNTER — Inpatient Hospital Stay: Payer: Self-pay

## 2019-01-11 ENCOUNTER — Other Ambulatory Visit (INDEPENDENT_AMBULATORY_CARE_PROVIDER_SITE_OTHER): Payer: Self-pay | Admitting: Medical

## 2019-01-11 DIAGNOSIS — R079 Chest pain, unspecified: Secondary | ICD-10-CM

## 2019-01-13 ENCOUNTER — Other Ambulatory Visit (INDEPENDENT_AMBULATORY_CARE_PROVIDER_SITE_OTHER): Payer: Self-pay | Admitting: Medical

## 2019-01-13 DIAGNOSIS — R079 Chest pain, unspecified: Secondary | ICD-10-CM

## 2019-01-13 MED ORDER — MELOXICAM 7.5 MG OR TABS
ORAL_TABLET | ORAL | 1 refills | Status: DC
Start: 2019-01-13 — End: 2019-01-16

## 2019-01-16 ENCOUNTER — Other Ambulatory Visit (INDEPENDENT_AMBULATORY_CARE_PROVIDER_SITE_OTHER): Payer: Self-pay | Admitting: Nurse Practitioner

## 2019-01-16 DIAGNOSIS — G8929 Other chronic pain: Secondary | ICD-10-CM

## 2019-01-16 DIAGNOSIS — M5440 Lumbago with sciatica, unspecified side: Secondary | ICD-10-CM

## 2019-01-16 MED ORDER — MELOXICAM 7.5 MG OR TABS
ORAL_TABLET | ORAL | 0 refills | Status: DC
Start: 2019-01-16 — End: 2019-11-04

## 2019-01-17 NOTE — Telephone Encounter (Signed)
Awaiting reply from pt

## 2019-01-17 NOTE — Telephone Encounter (Signed)
Medication discontinued from med list on 12/13/18.   Should patient be on this medication?

## 2019-01-17 NOTE — Telephone Encounter (Signed)
Patient must be seen for refill. Telemedicine okay.     Staff, please schedule an appointment for her with August Luz, PA-C.

## 2019-03-03 ENCOUNTER — Inpatient Hospital Stay: Payer: Self-pay

## 2019-03-25 ENCOUNTER — Telehealth (INDEPENDENT_AMBULATORY_CARE_PROVIDER_SITE_OTHER): Payer: Self-pay | Admitting: Medical

## 2019-03-25 ENCOUNTER — Telehealth (INDEPENDENT_AMBULATORY_CARE_PROVIDER_SITE_OTHER): Payer: Self-pay

## 2019-03-25 NOTE — Telephone Encounter (Signed)
Called pt to gather information for form completion. Pt unable to speak at length, will return call at a later time.

## 2019-03-25 NOTE — Telephone Encounter (Signed)
Routing to [place] [facility].  Could you assist patient with [employer] paperwork?  It is likely that original forms went to [facility].

## 2019-03-25 NOTE — Telephone Encounter (Signed)
RETURN CALL: Voicemail - Detailed Message      SUBJECT:  Form/Letter/Paperwork Request     DATE NEEDED BY: asap  PICK UP/FAX/MAIL: fax   ADDITIONAL INFORMATION: [employer] has sent a couple different faxes to the clinic to fill out paperwork for patient appt transportation with no response.

## 2019-03-25 NOTE — Telephone Encounter (Signed)
Forms completed. Placed in provider blue folder for review and signature.

## 2019-03-25 NOTE — Telephone Encounter (Signed)
Duplicate encounter, documentation in previous TE.

## 2019-03-25 NOTE — Telephone Encounter (Signed)
Called [employer] to request that forms are faxed to [facility] clinic.    HN advised the forms will be faxed within the next half hour.    Routing to FD as FYI - please notify HN when forms come in.

## 2019-04-12 ENCOUNTER — Encounter (INDEPENDENT_AMBULATORY_CARE_PROVIDER_SITE_OTHER): Payer: Medicare HMO | Admitting: Medical

## 2019-04-24 ENCOUNTER — Other Ambulatory Visit: Payer: Self-pay

## 2019-04-27 ENCOUNTER — Encounter (INDEPENDENT_AMBULATORY_CARE_PROVIDER_SITE_OTHER): Payer: Medicare HMO | Admitting: Family Medicine

## 2019-05-30 ENCOUNTER — Telehealth (INDEPENDENT_AMBULATORY_CARE_PROVIDER_SITE_OTHER): Payer: Self-pay

## 2019-05-30 NOTE — Telephone Encounter (Signed)
Sent eCare to pt advising they are due for AWV.

## 2019-05-31 ENCOUNTER — Ambulatory Visit: Payer: Self-pay

## 2019-05-31 ENCOUNTER — Encounter (INDEPENDENT_AMBULATORY_CARE_PROVIDER_SITE_OTHER): Payer: Self-pay | Admitting: Medical

## 2019-05-31 NOTE — Telephone Encounter (Signed)
Pt says the past 2 days she's having sharp pain at breast bone area and upper stomach.   Pt says pain isn't in her chest and doesn't feel like heartburn. Pt says pain is 7/10 but can get worse.   No pattern noted with food or activity.   Pt says pain is really bad when lying/sleeping.   Pt says she has hiatal hernia and unsure if this is related.   Pt has had some loose BM's-says she's taking tylenol and pepto-bismol w/o relief.     Denies fever,N/V,black stool,belching or indigestion.    Advised eval; pt will go to either [place] or [place] when she gets off work(says [facility] will be closed so will go to ER).     Reason for Disposition  . Constant abdominal pain lasting > 2 hours    Protocols used: ABDOMINAL PAIN - UPPER - ADULT -[place]

## 2019-05-31 NOTE — Telephone Encounter (Signed)
Ecare message:    I been hurting under my [place] in the middle of my stomach for a few days. It not getting better it hurting more and more. I been talking Pepto-Bismol and Tylenol it not helping with the pain. I don't now what to do.

## 2019-05-31 NOTE — Telephone Encounter (Signed)
Routing to triage RN to assist.     Patient reports abdominal pain.

## 2019-07-30 ENCOUNTER — Inpatient Hospital Stay: Payer: Self-pay

## 2019-08-01 ENCOUNTER — Encounter (INDEPENDENT_AMBULATORY_CARE_PROVIDER_SITE_OTHER): Payer: Self-pay

## 2019-08-02 ENCOUNTER — Encounter (INDEPENDENT_AMBULATORY_CARE_PROVIDER_SITE_OTHER): Payer: Self-pay | Admitting: Medical

## 2019-08-02 ENCOUNTER — Telehealth (INDEPENDENT_AMBULATORY_CARE_PROVIDER_SITE_OTHER): Payer: Self-pay | Admitting: Medical

## 2019-08-02 ENCOUNTER — Telehealth (INDEPENDENT_AMBULATORY_CARE_PROVIDER_SITE_OTHER): Payer: Medicare HMO | Admitting: Medical

## 2019-08-02 ENCOUNTER — Other Ambulatory Visit (INDEPENDENT_AMBULATORY_CARE_PROVIDER_SITE_OTHER): Payer: Self-pay | Admitting: Medical

## 2019-08-02 DIAGNOSIS — M79642 Pain in left hand: Secondary | ICD-10-CM

## 2019-08-02 DIAGNOSIS — R109 Unspecified abdominal pain: Secondary | ICD-10-CM

## 2019-08-02 DIAGNOSIS — M79641 Pain in right hand: Secondary | ICD-10-CM

## 2019-08-02 MED ORDER — HYDROCODONE-ACETAMINOPHEN 5-325 MG OR TABS
1.0000 | ORAL_TABLET | ORAL | 0 refills | Status: DC | PRN
Start: 2019-08-02 — End: 2019-08-12

## 2019-08-02 MED ORDER — OMEPRAZOLE 20 MG OR CPDR
20.0000 mg | DELAYED_RELEASE_CAPSULE | Freq: Every day | ORAL | 0 refills | Status: DC
Start: 2019-08-02 — End: 2019-08-04

## 2019-08-02 NOTE — Telephone Encounter (Signed)
LMTCB-Left brief into about our providers that are taking patient and to have also look at our website were she can find further info about the providers.    CCR: If patient returns call please assist with scheduling a OV to establish care. Thank you

## 2019-08-02 NOTE — Addendum Note (Signed)
Addended by: Purnell Shoemaker on: 08/02/2019 03:27 PM     Modules accepted: Level of Service

## 2019-08-02 NOTE — Telephone Encounter (Signed)
Patient would like to transfer care and is would like to be contacted with names of female PCPs taking new pts     Thank you

## 2019-08-02 NOTE — Progress Notes (Signed)
SUBJECTIVE:    Sharon Stephens is a 44 year old year old female who presents for     Distant Site Telemedicine Encounter    I conducted this encounter from [facility] via secure, live, face-to-face video conference with the patient. Sharon Stephens was located at home with Prior to the interview, the risks and benefits of telemedicine were discussed with the patient and verbal consent was obtained.      1/23 went to the ER for chest and abdominal pain   She had a CT scan she was told it was normal  Says she had normal chest X ray   Labs were normal   Says she was told pain may be caused by gas   Localizes pain to under breast bone to bellybutton   Says pain causes her double over   Pain has been consistent for three days and is intermittent   She tried Tums and pepto with no help   She tried Prilosec with no changes   Prior to her ED visit she had an episode of vomiting after eating rice, none since   Denies constipation   Bending over alleviates pain a little   She can telll she is bloated   No dietary changes     Had cholecystomy ?2001  Had bariatric surgery in 2011  Appendectomy in 2007      Has 2 hernias but has not been able to get surgical repair due to her weight     She was last sexually active in October, with a female partner     Also, wants to see a hand specialist  for cortisone injectionss for wrist and hand pain   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  Alert and oriented x3.    PSYCH:  Normal mood an affect.   Speech of normal content, volume, and pace.    (M79.641,  M79.642) Pain in both hands  (primary encounter diagnosis)  Plan: REFERRAL TO ORTHOPEDICS            (R10.9) Stomach pain  Says she is unable to take NSAIDs since  gastric bypass   requests prescription for pain   Asked MA to obtain ED discharge summary for review   Patient understands this is a one time, limited prescription   Plan: omeprazole (PriLOSEC) 20 MG DR capsule BID         HYDROcodone-acetaminophen 5-325 MG tablet,         REFERRAL TO GASTROENTEROLOGY-CLINIC       Discussed ED/UC precautions

## 2019-08-02 NOTE — Progress Notes (Signed)
Preventative Health Care Updates   Since your last visit, please tell us if you have had any of the below services outside of [facility]:     Cervical screening/PAP: NO  If yes, location/date.    Mammo:  NO  If yes, location/date.    Colon Screen: NO  If yes, location/ date.  Specialty Care Updates  Have seen a specialist since your last visit: NO   If yes, Name, location and date.    Any forms to complete today? NO       HM Due:         Health Maintenance   Topic Date Due   . Hepatitis C Screening  1976/05/11   . Cervical Cancer Screening  11/11/2013   . Depression Monitoring (PHQ-9)  05/10/2017   . Influenza Vaccine (1) 04/07/2019   . DTaP, Tdap, and Td Vaccines (2 - Td) 05/05/2019   . Diabetes Screening  07/16/2021   . HIV Screening  Completed   . Pneumococcal Vaccine: Pediatrics (0-5 years) and At-Risk Patients (6-64 years)  Completed   . Hepatitis A Vaccine  Aged Out   . Hepatitis B Vaccine  Aged Out           No future appointments.

## 2019-08-03 ENCOUNTER — Encounter (INDEPENDENT_AMBULATORY_CARE_PROVIDER_SITE_OTHER): Payer: Self-pay

## 2019-08-04 MED ORDER — OMEPRAZOLE 20 MG OR CPDR
20.0000 mg | DELAYED_RELEASE_CAPSULE | Freq: Every day | ORAL | 0 refills | Status: DC
Start: 2019-08-04 — End: 2019-11-09

## 2019-08-04 NOTE — Telephone Encounter (Signed)
2nd attempt through ecare

## 2019-08-05 ENCOUNTER — Encounter (INDEPENDENT_AMBULATORY_CARE_PROVIDER_SITE_OTHER): Payer: Self-pay

## 2019-08-08 ENCOUNTER — Other Ambulatory Visit (INDEPENDENT_AMBULATORY_CARE_PROVIDER_SITE_OTHER): Payer: Self-pay | Admitting: Family Medicine

## 2019-08-12 ENCOUNTER — Telehealth (INDEPENDENT_AMBULATORY_CARE_PROVIDER_SITE_OTHER): Payer: Medicare HMO | Admitting: Family

## 2019-08-12 DIAGNOSIS — Z9884 Bariatric surgery status: Secondary | ICD-10-CM

## 2019-08-12 DIAGNOSIS — F322 Major depressive disorder, single episode, severe without psychotic features: Secondary | ICD-10-CM

## 2019-08-12 DIAGNOSIS — F321 Major depressive disorder, single episode, moderate: Secondary | ICD-10-CM

## 2019-08-12 DIAGNOSIS — R109 Unspecified abdominal pain: Secondary | ICD-10-CM

## 2019-08-12 DIAGNOSIS — I471 Supraventricular tachycardia: Secondary | ICD-10-CM

## 2019-08-12 MED ORDER — BUPROPION HCL ER (XL) 150 MG OR TB24
150.0000 mg | EXTENDED_RELEASE_TABLET | Freq: Every day | ORAL | 2 refills | Status: DC
Start: 2019-08-12 — End: 2019-11-04

## 2019-08-12 MED ORDER — QUETIAPINE FUMARATE 25 MG OR TABS
25.0000 mg | ORAL_TABLET | Freq: Every evening | ORAL | 3 refills | Status: DC
Start: 2019-08-12 — End: 2019-11-04

## 2019-08-12 MED ORDER — HYDROCODONE-ACETAMINOPHEN 5-325 MG OR TABS
1.0000 | ORAL_TABLET | ORAL | 0 refills | Status: DC | PRN
Start: 2019-08-12 — End: 2019-08-16
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
## 2019-08-16 ENCOUNTER — Other Ambulatory Visit (INDEPENDENT_AMBULATORY_CARE_PROVIDER_SITE_OTHER): Payer: Self-pay | Admitting: Family

## 2019-08-16 DIAGNOSIS — R109 Unspecified abdominal pain: Secondary | ICD-10-CM

## 2019-08-20 MED ORDER — HYDROCODONE-ACETAMINOPHEN 5-325 MG OR TABS
1.0000 | ORAL_TABLET | ORAL | 0 refills | Status: DC | PRN
Start: 2019-08-20 — End: 2019-08-28

## 2019-08-20 NOTE — Telephone Encounter (Signed)
1 refill approved for 10 tabs. Please let patient know that she should only be using this as needed and trying to limit use as much as possible. If she needs another refill prior to gastroenterology visit in March then she will need visit with me to discuss.

## 2019-08-22 NOTE — Telephone Encounter (Signed)
Phone call to pt, no answer. Left  msg on VM-mailbox to call the clinic back.     eCare sent.    CCR: If patient returns call, please relay msg below.    PLEASE UPDATE TE IF/WHEN THIS IS DONE.

## 2019-08-28 ENCOUNTER — Other Ambulatory Visit (INDEPENDENT_AMBULATORY_CARE_PROVIDER_SITE_OTHER): Payer: Self-pay | Admitting: Family

## 2019-08-28 DIAGNOSIS — R109 Unspecified abdominal pain: Secondary | ICD-10-CM

## 2019-08-29 ENCOUNTER — Other Ambulatory Visit (INDEPENDENT_AMBULATORY_CARE_PROVIDER_SITE_OTHER): Payer: Self-pay | Admitting: Family

## 2019-08-29 DIAGNOSIS — R109 Unspecified abdominal pain: Secondary | ICD-10-CM

## 2019-08-30 MED ORDER — HYDROCODONE-ACETAMINOPHEN 5-325 MG OR TABS
1.0000 | ORAL_TABLET | ORAL | 0 refills | Status: DC | PRN
Start: 2019-08-30 — End: 2019-11-04

## 2019-08-30 NOTE — Telephone Encounter (Signed)
Will refill since patient is scheduled for 2/26, we will discuss further at that time.

## 2019-09-02 ENCOUNTER — Encounter (INDEPENDENT_AMBULATORY_CARE_PROVIDER_SITE_OTHER): Payer: Medicare HMO | Admitting: Family

## 2019-09-02 NOTE — Progress Notes (Signed)
Ms. Puello did not cancel and was not present for a scheduled appointment today.

## 2019-09-04 ENCOUNTER — Other Ambulatory Visit (INDEPENDENT_AMBULATORY_CARE_PROVIDER_SITE_OTHER): Payer: Self-pay | Admitting: Family

## 2019-09-04 DIAGNOSIS — R109 Unspecified abdominal pain: Secondary | ICD-10-CM

## 2019-09-05 NOTE — Telephone Encounter (Signed)
Needs to be seen. Patient no showed appointment last week.

## 2019-09-06 ENCOUNTER — Other Ambulatory Visit (INDEPENDENT_AMBULATORY_CARE_PROVIDER_SITE_OTHER): Payer: Self-pay | Admitting: Family

## 2019-09-06 DIAGNOSIS — R059 Cough, unspecified: Secondary | ICD-10-CM

## 2019-09-06 DIAGNOSIS — J449 Chronic obstructive pulmonary disease, unspecified: Secondary | ICD-10-CM

## 2019-09-06 DIAGNOSIS — R109 Unspecified abdominal pain: Secondary | ICD-10-CM

## 2019-09-06 NOTE — Telephone Encounter (Signed)
She will need to wait until appointment for refill or can try scheduling sooner visit to discuss if needed.

## 2019-09-07 ENCOUNTER — Other Ambulatory Visit (INDEPENDENT_AMBULATORY_CARE_PROVIDER_SITE_OTHER): Payer: Self-pay | Admitting: Family

## 2019-09-07 DIAGNOSIS — J449 Chronic obstructive pulmonary disease, unspecified: Secondary | ICD-10-CM

## 2019-09-07 MED ORDER — ALBUTEROL SULFATE HFA 108 (90 BASE) MCG/ACT IN AERS
2.0000 | INHALATION_SPRAY | Freq: Four times a day (QID) | RESPIRATORY_TRACT | 5 refills | Status: DC | PRN
Start: 2019-09-07 — End: 2019-09-08

## 2019-09-08 MED ORDER — ALBUTEROL SULFATE HFA 108 (90 BASE) MCG/ACT IN AERS
INHALATION_SPRAY | RESPIRATORY_TRACT | 1 refills | Status: DC
Start: 2019-09-08 — End: 2020-11-19

## 2019-09-09 ENCOUNTER — Other Ambulatory Visit (INDEPENDENT_AMBULATORY_CARE_PROVIDER_SITE_OTHER): Payer: Self-pay | Admitting: Family

## 2019-09-09 DIAGNOSIS — R109 Unspecified abdominal pain: Secondary | ICD-10-CM

## 2019-09-13 ENCOUNTER — Other Ambulatory Visit (INDEPENDENT_AMBULATORY_CARE_PROVIDER_SITE_OTHER): Payer: Self-pay | Admitting: Family

## 2019-09-13 DIAGNOSIS — R109 Unspecified abdominal pain: Secondary | ICD-10-CM

## 2019-09-13 NOTE — Telephone Encounter (Signed)
Medication Refill     Is this a controlled substance:Yes, hydrocodone  Do they have a controlled substance agreement:   No  Was pt seen in the last 3 months Yes:08/12/2019    Last written 08/30/2019  Number of refills 0    Last Labs:    Controlled Substance ( Chronic pain UA) :DateN/A      Rx and pharmacy pended.    Routing to provider to relay instructions for next step. Please advised on appointment type if indicated (in person ov, telemed, or phone visit).

## 2019-09-13 NOTE — Telephone Encounter (Signed)
This is being addressed in separate encounter.  

## 2019-09-13 NOTE — Telephone Encounter (Signed)
No refills until patient is seen.

## 2019-09-13 NOTE — Telephone Encounter (Signed)
Phone call to pt, no answer. Left  msg on VM-mailbox to call the clinic back.     eCare sent.    CCR: If patient returns call, please relay msg below.    PLEASE UPDATE TE IF/WHEN THIS IS DONE.

## 2019-09-14 NOTE — Telephone Encounter (Signed)
Pt read eCare. Closing TE

## 2019-09-15 ENCOUNTER — Encounter (INDEPENDENT_AMBULATORY_CARE_PROVIDER_SITE_OTHER): Payer: Medicare HMO | Admitting: Adult Health

## 2019-09-16 ENCOUNTER — Telehealth (INDEPENDENT_AMBULATORY_CARE_PROVIDER_SITE_OTHER): Payer: Medicare HMO | Admitting: Family
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
## 2019-09-21 ENCOUNTER — Ambulatory Visit (INDEPENDENT_AMBULATORY_CARE_PROVIDER_SITE_OTHER): Payer: Medicare HMO | Admitting: Adult Health

## 2019-09-21 ENCOUNTER — Other Ambulatory Visit (INDEPENDENT_AMBULATORY_CARE_PROVIDER_SITE_OTHER): Payer: Self-pay | Admitting: Adult Health

## 2019-09-21 ENCOUNTER — Encounter (INDEPENDENT_AMBULATORY_CARE_PROVIDER_SITE_OTHER): Payer: Medicare HMO | Admitting: Orthopedic Surgery

## 2019-09-21 VITALS — BP 141/88 | HR 86 | Temp 97.5°F | Ht 66.0 in | Wt >= 6400 oz

## 2019-09-21 DIAGNOSIS — R1319 Other dysphagia: Secondary | ICD-10-CM

## 2019-09-21 DIAGNOSIS — Z9884 Bariatric surgery status: Secondary | ICD-10-CM

## 2019-09-21 DIAGNOSIS — R1013 Epigastric pain: Secondary | ICD-10-CM

## 2019-09-21 DIAGNOSIS — R1310 Dysphagia, unspecified: Secondary | ICD-10-CM

## 2019-09-21 MED ORDER — SUCRALFATE 1 G OR TABS
1.0000 g | ORAL_TABLET | Freq: Two times a day (BID) | ORAL | 1 refills | Status: DC
Start: 2019-09-21 — End: 2019-09-22

## 2019-09-21 NOTE — Patient Instructions (Signed)
-   continue omeprazole 20 mg once daily. Take this at least 2 hours after eating if taking at night    - You'll be contacted to schedule the endoscopy     - Start sucralfate 1 tablet twice daily

## 2019-09-22 MED ORDER — SUCRALFATE 1 G OR TABS
ORAL_TABLET | ORAL | 0 refills | Status: DC
Start: 2019-09-22 — End: 2019-11-04

## 2019-09-25 NOTE — Progress Notes (Signed)
CARDIOLOGY CLINIC NOTE    ID / CHIEF CONCERN:  Sharon Stephens is a 44 year old woman with a history of SVT who was referred by Dr. Newman Pies and presents to clinic to establish care and treat her SVT.     HISTORY OF PRESENT ILLNESS:  Sharon Stephens presents today for evaluation and management of her SVT.  Patient was first diagnosed with SVT in 2018 when she was driving from [place] to [place] when she noticed her heart rate was significantly elevated.  She went to ER at [facility] where she was diagnosed with SVT with HR around 200 BPM.  She was given 2 doses of adenosine to restore sinus rhythm. Her episodes used to occur every 6 months, but now occur at least 1-2 times a month in the past 6 months. Her last episode occurred two weeks ago. These episodes last up to 30 minutes. She performs vagal maneuver and "bears down" to restore normal rhythm.  She denies any associated shortness of breath or chest pain but does experience some lightheadedness and diaphoresis. No syncope.  She has had episodes that have awakened her from sleep.  She has been seen in ER several times at [facility] with SVT, most recently in 2019.     She has a history of alcohol abuse and high energy drink intake which she discontinued about 2 years ago.  Unclear if her palpitations are better since than time, but recently symptoms are worse per above.    She denies chest pain, pressure, or tightness or shortness of breath. She endorses occasional PND. She had a sleep study and used to use a CPAP machine but no longer does. She denies recent LE edema. She had bariatric surgery and lost 300 pounds but gained most of it back.    REVIEW OF SYSTEMS:  Review of systems positive for:  lack of energy, trouble sleeping, unexplained weight loss, unexplained weight gain, fever, itching, chest pain, high blood pressure, shortness of breath, asthma, urinary infections, urinary stones, easy bruising , joint Pain, joint Swelling, arthritis ,  back pain, muscle aches, muscle tenderness, numbness, crying, sadness, depression, suicide attempts and abnormal pap smear   Complete review of systems otherwise negative.     PAST MEDICAL HISTORY:  1. Supraventricular tachycardia, likely AVNRT.  2. Hypertension.  3. Depression.  4. Morbid obesity.  5. Bilateral carpel tunnel syndrome.  6. Otalgia of right ear.  7. Primary osteoarthritis of right knee.  8. B12 deficiency.   9. Sprain of ribs.  10. Plantar fasciitis.  11. Hypokalemia.  12. Eczema.  13. Malabsorption.  14. Lower UTI.    PAST SURGICAL HISTORY:  1. Adrenalectomy.  2. Appendectomy.  3. Bariatric surgery 2011.  4. Cesarean delivery.  5. Cholecystectomy.  6. Tonsillectomy one-half. Age <14.  7. Tubes tied.     ALLERGIES:  AMOXICILLIN.  CUCUMIS.  GABAPENTIN.  IBUPROFEN.  PENICILLIN G.  PENICILLINS.     MEDICATIONS:  1. Acetaminophen 325 mg as needed.  2. Albuterol inhaler.  3. Bupropion 150 mg daily.  4. Fluticasone propionate nasal spray.  5. Hydrocodone-acetaminophen 5-325 mg as needed.  6. Meloxicam 7.5 mg as needed.  7. Omeprazole 20 mg daily.  8. Quetiapine 25 mg daily.     FAMILY HISTORY:  Mother died at age 28 of a heart attack. Father died from a gunshot. She has no siblings. She grew up in [place]. Updated 09/2019.     SOCIAL HISTORY:  She used to  drink heavily to the point where she would pass out. She is now two years sober. Used to use tobacco but quit 20 years ago. 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 She lives in [place]. She is not married. She has two children. She is currently unemployed. Updated 09/2019.     PHYSICAL EXAMINATION:   GEN: Well-appearing, well-nourished, no acute distress.   VITAL SIGNS: BP (!) 137/92   Pulse 79   Temp 97.1 F (36.2 C)   Ht 5\' 6"  (1.676 m)   Wt (!) 408 lb (185.1 kg)   SpO2 99%   BMI 65.85 kg/m      NEUROLOGIC:  Alert and oriented x3.  Moves all four extremities spontaneously.  HEENT: eyes anicteric, no pallor.   NECK:  JVP is flat.  There  are 2+ carotid upstrokes without bruits. No thyromegaly. No cervical adenopathy.  LUNGS:  Clear to auscultation bilaterally.  No rales or wheezes.  CARDIOVASCULAR:  Regular rate and rhythm.  S1, S2 are present.  There are no murmurs, rubs, or gallops.  ABDOMEN:  Soft, nontender, nondistended with active bowel sounds.  No obvious masses or bruits.  No obvious hepatosplenomegaly.  EXTREMITIES:  There is no cyanosis, clubbing or edema.  There are 2+ distal pulses.  SKIN:  There are no obvious lesions.     RESULTS:    Results for orders placed or performed during the hospital encounter of 07/15/18   Basic Metabolic Panel   Result Value Ref Range    Sodium 138 135 - 145 meq/L    Potassium 3.9 3.6 - 5.2 meq/L    Chloride 105 98 - 108 meq/L    Carbon Dioxide, Total 25 22 - 32 meq/L    Anion Gap 8 4 - 12    Glucose 94 62 - 125 mg/dL    Urea Nitrogen 10 8 - 21 mg/dL    Creatinine 09/13/18 7.78 - 1.02 mg/dL    Calcium 8.8 (L) 8.9 - 10.2 mg/dL    GFR, Calc, European American >60 >59 mL/min/[1.73_m2]    GFR, Calc, African American >60 >59 mL/min/[1.73_m2]    GFR, Information       Calculated GFR in mL/min/1.73 m2 by MDRD equation.  Inaccurate with changing renal function.  See [email or web address]     Component   Ref Range & Units 12/25/18 0011 Comments   Albumin   3.2 - 5.5 g/dL 3.4     Total Protein   6.0 - 8.5 g/dL 7.7     Alkaline Phosphatase   25 - 165 U/L 72     ALT   <=50 U/L 15     AST   <=50 U/L 14     Bilirubin Direct   <=0.4 mg/dL 0.1     Bilirubin Total   <=1.2 mg/dL 0.5       Component   Ref Range & Units 05/12/18 0000   Hemoglobin A1C   4.0 - 6.0 % 5.3      09/26/2019 - EKG  Baseline artifact. Normal sinus rhythm. Left anterior fascicular block. Abnormal ECG. When compared with ECG of 07/05/2012, no significant change. EKG from 10/16/2017 reviewed. 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    10/21/2017 - TTE Report  Sinus rhythm.  Moderately increased left ventricular size, with normal  wall thickness, systolic function and regional wall motion. Impaired relaxation.  Normal diastolic function.  Normal RV size and systolic function. Normal estimated right atrial pressure. Unable to  estimate pulmonary artery systolic pressure due to inadequate tricuspid regurgitation.   No significant valvular pathology.   Normal root and ascending aorta.  No pericardial effusion.  No priors for comparison.     ASSESSMENT / PLAN:  Sharon Stephens is a 5 year old woman with a history of SVT who was referred by Dr. [place] and presents to clinic to establish care .      1. 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 Patient reports several month history of worsening palpitations lasting up to 30 minutes per episode. Usually responsive to vagal maneuvers. Given increasing frequency, will start low dose metoprolol in the evening. If symptoms continue, patient is instructed to contact me through e-care to increase metoprolol to 50 mg daily. Plan to reassess symptoms in 3 months. Patient had an echocardiogram in 2019, showed essentially a structurally normal heart.   2. Hypertension. BP is mildly elevated. Target <130/80 mmHg. Will start metoprolol for SVT which should also help get BP to target.  3. Follow-up. Patient will return in 3 months to reevaluate symptoms.      Thank you for referring Sharon Stephens to our clinic.       09/26/2019 @ 4:48 PM - I, Carron Curie acted as a Education administrator and documented the service/procedure performed to the best of my knowledge in the presence of Ruthe Mannan M.D., M.S., F.A.C.C., who will provide the final review and authentication.  Signed: Carron Curie

## 2019-09-26 ENCOUNTER — Ambulatory Visit (INDEPENDENT_AMBULATORY_CARE_PROVIDER_SITE_OTHER): Payer: Medicare HMO | Admitting: Hand Surgery

## 2019-09-26 ENCOUNTER — Ambulatory Visit (INDEPENDENT_AMBULATORY_CARE_PROVIDER_SITE_OTHER): Payer: Medicare HMO | Admitting: Cardiovascular Disease

## 2019-09-26 ENCOUNTER — Encounter (INDEPENDENT_AMBULATORY_CARE_PROVIDER_SITE_OTHER): Payer: Self-pay | Admitting: Cardiovascular Disease

## 2019-09-26 ENCOUNTER — Encounter (INDEPENDENT_AMBULATORY_CARE_PROVIDER_SITE_OTHER): Payer: Self-pay | Admitting: Hand Surgery

## 2019-09-26 ENCOUNTER — Ambulatory Visit: Payer: Medicare HMO | Attending: Hand Surgery

## 2019-09-26 VITALS — BP 132/83 | HR 86 | Temp 95.8°F | Ht 66.0 in | Wt >= 6400 oz

## 2019-09-26 VITALS — BP 137/92 | HR 79 | Temp 97.1°F | Ht 66.0 in | Wt >= 6400 oz

## 2019-09-26 DIAGNOSIS — M79641 Pain in right hand: Secondary | ICD-10-CM

## 2019-09-26 DIAGNOSIS — M79642 Pain in left hand: Secondary | ICD-10-CM

## 2019-09-26 DIAGNOSIS — M19042 Primary osteoarthritis, left hand: Secondary | ICD-10-CM | POA: Insufficient documentation

## 2019-09-26 DIAGNOSIS — I471 Supraventricular tachycardia: Secondary | ICD-10-CM

## 2019-09-26 DIAGNOSIS — G5603 Carpal tunnel syndrome, bilateral upper limbs: Secondary | ICD-10-CM | POA: Insufficient documentation

## 2019-09-26 DIAGNOSIS — I10 Essential (primary) hypertension: Secondary | ICD-10-CM

## 2019-09-26 DIAGNOSIS — M19041 Primary osteoarthritis, right hand: Secondary | ICD-10-CM | POA: Insufficient documentation

## 2019-09-26 LAB — EKG 12 LEAD
Atrial Rate: 76 {beats}/min
P Axis: 75 degrees
P-R Interval: 152 ms
Q-T Interval: 424 ms
QRS Duration: 110 ms
QTC Calculation: 477 ms
R Axis: -55 degrees
T Axis: 43 degrees
Ventricular Rate: 76 {beats}/min

## 2019-09-26 MED ORDER — METOPROLOL SUCCINATE ER 25 MG OR TB24
25.0000 mg | EXTENDED_RELEASE_TABLET | Freq: Every day | ORAL | 3 refills | Status: DC
Start: 2019-09-26 — End: 2020-08-31

## 2019-09-26 NOTE — Progress Notes (Signed)
09/26/19     I, Taresa Montville, MD, MS, FACC, personally performed the services described in this documentation, as scribed by Janae Xhelili in my presence.  I have reviewed and edited the note as appropriate. It is both accurate and complete.    Davona Kinoshita, MD, MS, FACC  Clinical Professor of Medicine  [employer]   of [employer]

## 2019-09-26 NOTE — Progress Notes (Signed)
Chief Complaint   Patient presents with   . Wrist Problem     bilateral hand pain, last seen in 2011 for L wrist pain         Dear Dr. Caesar Chestnut, Lacinda Axon, PA-C,    I had the pleasure of seeing your patient, Sharon Stephens at [facility] today on 09/26/2019 in consultation for evaluation and treatment of bilateral hand pain.    As you know, Sharon Stephens is a 44 year old RHD female who is unemployed since 2004. She presents with a chief complaint of pain, numbness, swelling and weakness in the bilateral hand that has been ongoing for 6 months. Patient denies any history of trauma or injury to the hand.  She notes night time wakenings from this with tingling in the fingertips.  She has symptoms mostly in her R index, middle, and ring fingers as well as L index and middle fingers.      She currently rates her pain as 7 out of 10 that is Aching and Sharp. Pain is aggravated by activity and sleep. Sensation is diminished in the fingertips, mostly affecting the index and middle fingers. She notes a decrease in strength. She currently rates her strength as 6 out of 10. The symptoms are gradually worsening.    Past Medical History:  Past Medical History:   Diagnosis Date   . Allergic rhinitis, cause unspecified    . Chronic obstructive asthma, unspecified (HCC)    . Depressive disorder, not elsewhere classified    . Esophageal reflux    . Essential hypertension, benign    . Generalized osteoarthrosis, unspecified site    . Headache    . Hernia of other specified sites of abdominal cavity without mention of obstruction or gangrene     notes having 3 in abdomen    . Morbid obesity (HCC)    . Thyroid disease    . Type II or unspecified type diabetes mellitus without mention of complication, not stated as uncontrolled     broaderline    . Unspecified sleep apnea    . Wound infection        Medications:  Current Outpatient Medications   Medication Sig Dispense Refill   . Acetaminophen 325 MG Oral Tab Take  1 tablet (325 mg) by mouth every 6 hours as needed for pain. For Pain. 50 tablet 3   . albuterol HFA 108 (90 Base) MCG/ACT inhaler INHALE 2 PUFFS BY MOUTH EVERY 6 HOURS AS NEEDED FOR SHORTNESS OF BREATH OR WHEEZING OR COUGH 25.5 g 1   . buPROPion XL 150 MG 24 hr tablet Take 1 tablet (150 mg) by mouth daily. 30 tablet 2   . fluticasone propionate 50 MCG/ACT nasal spray Spray 2 sprays into each nostril daily. 16 g 5   . HYDROcodone-acetaminophen 5-325 MG tablet Take 1-2 tablets by mouth every 4 hours as needed for severe pain. Do not exceed 8 tablets per day. 10 tablet 0   . Levonorgestrel 20 MCG/24HR Intrauterine IUD 1 Intra Uterine Device by Intrauterine route One time.     . meloxicam 7.5 MG tablet TAKE 1 TABLET BY MOUTH EVERY DAY AS NEEDED FOR PAIN 90 tablet 0   . omeprazole 20 MG DR capsule Take 1 capsule (20 mg) by mouth daily on an empty stomach. 90 capsule 0   . QUEtiapine 25 MG tablet Take 1 tablet (25 mg) by mouth at bedtime. 90 tablet 3   . sucralfate 1 g  tablet TAKE 1 TABLET(1 GRAM) BY MOUTH TWICE DAILY 180 tablet 0     No current facility-administered medications for this visit.        Allergies:   Review of patient's allergies indicates:  Allergies   Allergen Reactions   . Amoxicillin      Other reaction(s): Undetermined  Unsure from childhood    . Cucumis Itching   . Gabapentin      Has made her feel sick to her stomach the times she has tried taking this since bariatric surgery    . Ibuprofen      'I can't take because of my gastric bypass surgery'    . Penicillin G    . Penicillins      Other reaction(s): Undetermined  Unsure from childhood        Past Surgical History:  Past Surgical History:   Procedure Laterality Date   . ADRENALECTOMY     . APPENDECTOMY     . bariatric surgery  2011   . BARIATRIC SURGERY     . c-sections     . CESAREAN DELIVERY ONLY     . CHOLECYSTECTOMY     . gallbladder removed     . GASTRIC RSTCV W/BYP W/SM INT RCNSTJ LIMIT ABSRPJ     . LIG/TRNSXJ FLP TUBE ABDL/VAG APPR UNI/BI      . TONSILLECTOMY ONE-HALF <AGE 24     . tube tied         Social History:   The patient states her problem is not work related.  She is single and has 2 children.     Social History     Substance and Sexual Activity   Alcohol Use No    Comment: Date Quit: Aug 2015     Social History     Tobacco Use   Smoking Status Never Smoker   Smokeless Tobacco Never Used       ROS:   Positive for Eyes:  glasses/contacts  Heart:  irregular heart beat  Lungs:  shortness of breath and asthma  Muscle/Bones:  arthritis  Mental Health:  Anxiety and depression    All other systems of the 14 reviewed are negative.     Hand & Upper Extremity Examination    Physical Examination  Gen: Patient is healthy, alert, no distress    Psych: Alert and oriented times 3  Pleasant female. Mood and affect appropriate.    Skin: warm, normal color and sweat patterns.    Vascular: Fingers warm, pink, with brisk capillary refill    Neurologic: Sensation to light touch is decreased over the index and middle fingers bilaterally  Negative Tinel's on the R  Positive Tinel's L carpal tunnel  Positive Durkan's bilateral carpal tunnel    Musculoskeletal:  Inspection of the  bilateral upper extremity shows no gross deformity or evidence of atrophy.  Wrist flexion 80 extension 70 bilaterally  Full composite flexion and extension all fingers bilateral hands  5/5 APB strength bilaterally        Assessment:   44 yo female with bilateral carpal tunnel syndrome      Plan:  I discussed with Ms. Bobeck that her history and exam are c/w bilateral carpal tunnel syndrome. I recommended NCS/EMG for further evaluation    Follow-up to review NCS findings and treatment options.      Thank you for allowing [place] to participate in the care of your patient.    Sincerely,  Loistine Simas, MD  Associate Professor  [facility] of [facility]  Department of Orthopaedics and Sports Medicine  Hand, Wrist, and Elbow Surgery

## 2019-09-26 NOTE — Patient Instructions (Addendum)
[facility]  [facility]  [facility]  [address]  [place], [place] [address]  Phone #: [phone]  FAX #: [phone]    Please take this form to the reception desk, prior to leaving the clinic today.    Follow-up Appointment:  3 months    Labs to be Performed:  None    Studies to be Performed:  None    Referrals  None     Instructions for Patient  1. Please start metoprolol succinate 25 mg daily.  2. Please avoid drinking alcohol and energy drinks.  3. If you experience worsening symptoms, please contact me through e-care.

## 2019-10-03 ENCOUNTER — Encounter (INDEPENDENT_AMBULATORY_CARE_PROVIDER_SITE_OTHER): Payer: Self-pay | Admitting: Family

## 2019-10-07 ENCOUNTER — Telehealth (INDEPENDENT_AMBULATORY_CARE_PROVIDER_SITE_OTHER): Payer: Medicare HMO | Admitting: Family

## 2019-10-07 ENCOUNTER — Telehealth (INDEPENDENT_AMBULATORY_CARE_PROVIDER_SITE_OTHER): Payer: Self-pay | Admitting: Family

## 2019-10-07 DIAGNOSIS — Z9884 Bariatric surgery status: Secondary | ICD-10-CM

## 2019-10-07 DIAGNOSIS — M5440 Lumbago with sciatica, unspecified side: Secondary | ICD-10-CM

## 2019-10-07 DIAGNOSIS — G8929 Other chronic pain: Secondary | ICD-10-CM

## 2019-10-07 DIAGNOSIS — G5603 Carpal tunnel syndrome, bilateral upper limbs: Secondary | ICD-10-CM

## 2019-10-07 DIAGNOSIS — G4733 Obstructive sleep apnea (adult) (pediatric): Secondary | ICD-10-CM

## 2019-10-07 DIAGNOSIS — I10 Essential (primary) hypertension: Secondary | ICD-10-CM

## 2019-10-07 DIAGNOSIS — M1711 Unilateral primary osteoarthritis, right knee: Secondary | ICD-10-CM

## 2019-10-07 NOTE — Telephone Encounter (Addendum)
Addressed during telemedicine visit.    Disabled parking permit completed, prescription printed and signed. Please mail to patient.

## 2019-10-07 NOTE — Telephone Encounter (Signed)
Please call patient to help with scheduling for endoscopy - patient has been calling every day and is frustrated that she cannot get scheduled for this.
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
## 2019-10-07 NOTE — Telephone Encounter (Signed)
Mailed to patient

## 2019-10-12 ENCOUNTER — Other Ambulatory Visit (HOSPITAL_COMMUNITY): Payer: Self-pay | Admitting: Adult Health

## 2019-10-12 ENCOUNTER — Telehealth (HOSPITAL_BASED_OUTPATIENT_CLINIC_OR_DEPARTMENT_OTHER): Payer: Self-pay

## 2019-10-12 DIAGNOSIS — R1013 Epigastric pain: Secondary | ICD-10-CM

## 2019-10-12 DIAGNOSIS — R1310 Dysphagia, unspecified: Secondary | ICD-10-CM

## 2019-10-12 NOTE — Telephone Encounter (Signed)
GI Health Assessment Questions    Estimated body mass index is 65.85 kg/m as calculated from the following:    Height as of 09/26/19: 5\' 6"  (1.676 m).    Weight as of 09/26/19: 185.1 kg (408 lb).    HT: 5'6"  WT: 408 LBS  BMI: 65.85    Patient Assessment      Have you had a previous Endoscopy? Yes  If yes:  Where? [facility]  When? 2011  Procedure Type? EGD    Are you taking any Anti-coagulant or Anti-Platelet Medications? No  If yes, which medication are you taking:   Prescribing provider:   Inform patient to call the doctor who prescribed these medicines and ask for special instructions for your blood-thinning medicines     Are you on dialysis? No  If yes to any kind of Dialysis, OK to schedule. Advised patient to come dry/drained.   [facility] - DO NOT SCHEDULE  [facility] - route TE to Charge Nurse Pool for further review  [facility] - note in appointment line    Do you have a Pacemaker or Defibrillator? No  If yes, what kind of device do you have (Ex. [address] [place], [place])? No  If yes, name of Cardiologist? No  . Internal [place] Cardiologist, route TE to "p [facility] Device Clinic Pool" and "p [facility] Charge Nurse Pool" for further review  . External Cardiologist, fax device form to patient's provider and route TE to Charge Nurse Pool for further review    Are you Diabetic?  No  Inform patient to call the doctor who prescribed these medicines and ask for special instructions for your diabetes medications     Are you a difficult IV start? Yes  If yes, arranged for [facility] early arrival time?   Do you require an ultrasound machine to assist with IV start?   If yes to ultrasound, DO NOT SCHEDULE AT [facility]     Are you wheelchair bound? No  If yes, DO NOT SCHEDULE AT [facility]     Is the patient scheduled for an ERCP? No  If yes, do you have an allergy to contrast, iodine or shellfish? If yes, what are your symptoms? No  If yes, ok to schedule. Routed TE to Interventional Pool for further evaluation of contrast allergies? No    Is the  patient scheduled for an ALS PEG? No  If yes, do you have a CPAP/BIPAP? No  If yes, are you able to remove your CPAP or BiPAP mask on your own? No  If no, arranged for an ICU bed post procedure? No    Is the patient scheduled for a Therapeutic procedure? No  If yes, please obtain IMAGING.    PROCEDURE        Procedure MD: Dr  Procedure Type: EGD  Procedure Date:  4/16       Time: 8:30    Procedure Check-In Time: 7:30    Patient Teaching    Was the topic of blood thinners taught?  Yes     Reminder that family member or friend must escort patient? Yes     What is the name of their driver?     How were the procedure preparation instruction materials delivered?  Verbal: Yes  On Paper: No  eCare message: Yes  Email: No  E-mail address:      ZipWhip Appointment Confirmation Sent: No     Does this procedure require bowel prep? No  If yes, were  instructions for the ordered laxative taught? No  If yes, which RX was prescribed? N/A     General Notes:

## 2019-10-13 ENCOUNTER — Telehealth (HOSPITAL_BASED_OUTPATIENT_CLINIC_OR_DEPARTMENT_OTHER): Payer: Self-pay

## 2019-10-13 ENCOUNTER — Other Ambulatory Visit (HOSPITAL_BASED_OUTPATIENT_CLINIC_OR_DEPARTMENT_OTHER): Payer: Self-pay

## 2019-10-13 DIAGNOSIS — Z20822 Contact with and (suspected) exposure to covid-19: Secondary | ICD-10-CM

## 2019-10-13 DIAGNOSIS — Z01812 Encounter for preprocedural laboratory examination: Secondary | ICD-10-CM

## 2019-10-13 NOTE — Telephone Encounter (Signed)
Pt has been scheduled.  °

## 2019-10-13 NOTE — Telephone Encounter (Signed)
Pt schd for EGD w/ Dr Orbie Pyo on 4/16    Pt schd for C19 test on 4/13 at [facility]    Pending C19 order to RN [facility]

## 2019-10-13 NOTE — Telephone Encounter (Signed)
Placed an order for C19 pre-procedural testing.

## 2019-10-17 ENCOUNTER — Telehealth (INDEPENDENT_AMBULATORY_CARE_PROVIDER_SITE_OTHER): Payer: Self-pay | Admitting: Family

## 2019-10-17 ENCOUNTER — Encounter (INDEPENDENT_AMBULATORY_CARE_PROVIDER_SITE_OTHER): Payer: Self-pay | Admitting: Family

## 2019-10-17 NOTE — Telephone Encounter (Signed)
RETURN CALL: Voicemail - General Message  Vicky   [phone]    SUBJECT:  Referral Request/Questions      REASON FOR REFERRAL: sleep study   NAME OF CLINIC/SPECIALTY: [facility]  PROVIDER:  n/a  PHONE: [phone] FAX: [phone]  ADDITIONAL INFORMATION:

## 2019-10-17 NOTE — Telephone Encounter (Signed)
Sleep Med referral placed on 10/07/2019 - referral currently pending chart note completion

## 2019-10-18 ENCOUNTER — Ambulatory Visit (HOSPITAL_COMMUNITY): Payer: Self-pay

## 2019-10-18 ENCOUNTER — Ambulatory Visit: Payer: Medicare HMO | Attending: Gastroenterology

## 2019-10-18 DIAGNOSIS — Z01812 Encounter for preprocedural laboratory examination: Secondary | ICD-10-CM | POA: Insufficient documentation

## 2019-10-18 DIAGNOSIS — Z20822 Contact with and (suspected) exposure to covid-19: Secondary | ICD-10-CM | POA: Insufficient documentation

## 2019-10-18 NOTE — Progress Notes (Signed)
Patient was seen on 10/18/2019 at the [place] [facility] COVID 19 TEST SITE drive up site where a sample of Nasal Pharyngeal collection was taken. The specimen was sent to the [place] lab for COVID-19 testing.  Patient will be informed of test results within 48 hours.  Patient received informational instructions on self-care.    The specimen was collected by:Niya Chacko, RN

## 2019-10-18 NOTE — Patient Instructions (Signed)
Evaluation for COVID-19  Testing, Result Information, Symptom Management    Who is being tested for COVID-19?  [facility] is testing patients for COVID-19 for:  1. Patients who have symptoms that may be related to COVID-19  2. Patients who were exposed to COVID-19  3. Patients who require testing for travel or to return to work  4. Patients who do not have symptoms, but have an upcoming surgery or procedure that requires routine COVID-19 testing beforehand    If a COVID-19 test was ordered, what number do I call to set up a swabbing appointment?  You may call the [facility] COVID-19 Line at [phone].    If you are experiencing symptoms, what do we believe you have?  You have a viral syndrome, which may include symptoms like muscle aches, fevers, chills, runny nose, cough, sneezing, sore throat, vomiting or diarrhea.     SARS-CoV-2, the virus that causes COVID-19, is one of the potential viruses you may have. You may be just as likely to have a different viral infection such as the common cold or flu.    Most patients with COVID-19 have mild symptoms and recover on their own. Resting, staying hydrated, and sleeping are typically helpful. As of today's visit, you are well enough to go home and treat your symptoms with oral fluids and medicines for fevers, cough, pain, etc. If your symptoms worsen, you should seek additional medical care.    Why is COVID-19 testing being performed before my surgery/procedure?  The safety of our patients and staff is our top priority. We are performing COVID-19 testing before certain surgeries and procedures to maintain everyone's safety and help prevent others from getting infected or exposed.    When will I receive results for my COVID-19 test?  If COVID-19 testing is performed, the results should be available in 1-2 days. You may find testing follow-up instructions here: [email or web address]    Who can I contact for questions?  Call  [phone] for any COVID-19 questions or if your symptoms are worsening. Please allow 48 hours for results to finalize before contacting us about your result status.    How do I receive results?  Please do not contact the Emergency Department or clinic for results of this test. Please wait to be contacted as outlined below and do not go to your doctor's office for results.    IF THE RESULT IS POSITIVE OR INCONCLUSIVE  A member of the [facility] team will call you for further discussion. You may also view your result in eCare or through a QR code you may receive at your testing site.    IF THE RESULT IS NEGATIVE  You will receive this information by phone, via eCare, or a QR code you may receive at your testing site.    Pre-surgical evaluations  A member of your surgery team will review your results and contact you if needed. Please remain isolated until your surgery date to reduce the risk of COVID-19 exposure.    eCare  If you are a [facility] patient, eCare ([email or web address]) is the fastest way to receive your results. Results will be released to eCare within one hour of being posted in our system, and you may receive your results before we are able to contact you.    QR Code  You may receive a QR code label at the time of your test. If you do not have an eCare account, you may use the QR code label to view   your results at [email or web address]. You will not receive a notification when your result is ready to view on this site, but you can visit the site as many times as you wish to check the result status.    What do I do while I wait for my test results?   Stay home except to get medical care. Do not return to work or your regular activities outside of home. Remain isolated until you receive your results.    After receiving your results, follow the instructions here: [email or web address]    Please follow the precautions below:   Stay home except to  get medical care.     Do not go to work, school, or public areas. Avoid using public transportation, ride-sharing, or taxis.   Separate yourself from other people in your home as much as possible.   Stay in a specific room and away from other people in your home as much as possible. Use a separate bathroom if possible.   Do not share household items with other people in your home.   This includes sharing dishes, drinking glasses, cups, eating utensils, towels or bedding. After using these items, they should be washed thoroughly with soap and water.   Clean all "high-touch" surfaces regularly.   This includes counters, tabletops, doorknobs, bathroom fixtures, toilets, phones, keyboards, tablets and bedside tables. Also, clean any surfaces that may have blood, stool or body fluids on them. Use a household cleaning spray or wipe, according to the label instructions.   Cover your coughs and sneezes with a tissue, mask or the inside of your elbow.   Throw used tissues in a lined trash can; immediately wash your hands with soap and water for at least 20 seconds or clean your hands with an alcohol-based hand sanitizer that contains at least 60% alcohol. Soap and water should be used if hands are visibly dirty.   When seeking care at a healthcare facility:   Seek prompt medical attention if your illness is worsening (e.g., difficulty breathing).   When possible, call the healthcare provider before arriving.   Put on a facemask before you enter the facility.   If possible, put on a facemask before the ambulance or paramedics arrive.   These steps will help the healthcare provider's office prevent other people from getting infected or exposed.      Please see the resources below for more information  Information Lines  [place] [facility] COVID-19 Call Center: 1-800-525-0127   [facility] COVID-19 Line: [phone]    [facility] Websites  COVID-19  Information  [email or web address]    [place] Department of Health Websites  General Facts on COVID-19  doh.[place].gov/Emergencies/NovelCoronavirusOutbreak2020/FactSheet    What to do if you were potentially exposed to someone with confirmed coronavirus disease (COVID-19)  doh.wa.gov/Portals/1/Documents/1600/coronavirus/COVIDexposed.pdf    Centers for Disease Control and Prevention (CDC) Websites  COVID-19 FAQs:  cdc.gov/coronavirus/2019-ncov/faq.html    What to do if you are sick: cdc.gov/coronavirus/2019-ncov/if-you-are-sick/steps-when-sick.html

## 2019-10-18 NOTE — Telephone Encounter (Signed)
Awaiting patient response

## 2019-10-19 LAB — COVID-19 CORONAVIRUS QUALITATIVE PCR: COVID-19 Coronavirus Qual PCR Result: NOT DETECTED

## 2019-10-19 NOTE — Telephone Encounter (Signed)
Per review of chart, referral was placed by PCP for preferred location on 4/2 but is not done being processed

## 2019-10-20 ENCOUNTER — Encounter (HOSPITAL_COMMUNITY): Payer: Self-pay | Admitting: Neurosurgery

## 2019-10-20 DIAGNOSIS — K219 Gastro-esophageal reflux disease without esophagitis: Secondary | ICD-10-CM | POA: Insufficient documentation

## 2019-10-20 NOTE — Anesthesia Preprocedure Evaluation (Addendum)
Patient: [facility]    Procedure Information     Date/Time: 10/21/19 0830    Scheduled providers: Sumner Boast, MD; Sherol Dade, MD    Procedure: EGD    Location: [facility]        HPI: Per GI  44 year old female with morbid obesity, s/p RYGB 2011, s/p cholecystectomy and appendectomy with chronic intermittent epigastric pain since RYGB. Prior work up including CT has revealed incisional hernia, but no other acute findings to explain pain. She has no recent evalaution with EGD. She denies etoh, tobacco, nsaid use and has no changes to stools. She does experience persistent, intermittent solid food dysphagia. I've recommneded EGD at this time to r/o PUD, esophagitis, esophageal stricture/ring and neoplasm.    Relevant Problems   Cardio   (+) Essential hypertension, benign   (+) Supraventricular tachycardia (HCC)      GI/Hepatic/Renal   (+) GERD (gastroesophageal reflux disease)      Other   (+) Morbid obesity (HCC)       Relevant surgical history:   Past Surgical History:   Procedure Laterality Date   . ADRENALECTOMY     . bariatric surgery  2011   . BARIATRIC SURGERY     . c-sections     . gallbladder removed     . PR APPENDECTOMY     . PR CESAREAN DELIVERY ONLY     . PR CHOLECYSTECTOMY     . PR GASTRIC RSTCV W/BYP W/SM INT RCNSTJ LIMIT ABSRPJ     . PR LIG/TRNSXJ FLP TUBE ABDL/VAG APPR UNI/BI     . PR TONSILLECTOMY ONE-HALF <AGE 28     . tube tied           Medications:   Prior to Admission medications    Medication Sig Start Date End Date Taking? Authorizing Provider   Acetaminophen 325 MG Oral Tab Take 1 tablet (325 mg) by mouth every 6 hours as needed for pain. For Pain. 10/05/17   Boxwell, Fayrene Helper, ARNP   albuterol HFA 108 (90 Base) MCG/ACT inhaler INHALE 2 PUFFS BY MOUTH EVERY 6 HOURS AS NEEDED FOR SHORTNESS OF BREATH OR WHEEZING OR COUGH 09/08/19   Loleta Books, ARNP   buPROPion XL 150 MG 24 hr tablet Take 1 tablet (150 mg) by mouth daily. 08/12/19 11/10/19  Loleta Books,  ARNP   fluticasone propionate 50 MCG/ACT nasal spray Spray 2 sprays into each nostril daily. 10/15/18   Boxwell, Fayrene Helper, ARNP   HYDROcodone-acetaminophen 5-325 MG tablet Take 1-2 tablets by mouth every 4 hours as needed for severe pain. Do not exceed 8 tablets per day. 08/30/19   Loleta Books, ARNP   Levonorgestrel 20 MCG/24HR Intrauterine IUD 1 Intra Uterine Device by Intrauterine route One time. 07/10/15   Purnell Shoemaker, PA-C   meloxicam 7.5 MG tablet TAKE 1 TABLET BY MOUTH EVERY DAY AS NEEDED FOR PAIN 01/16/19   Purnell Shoemaker, PA-C   metoprolol succinate ER 25 MG 24 hr tablet Take 1 tablet (25 mg) by mouth daily. Do not chew or crush. 09/26/19   Leone Payor, MD   omeprazole 20 MG DR capsule Take 1 capsule (20 mg) by mouth daily on an empty stomach. 08/04/19   Purnell Shoemaker, PA-C   QUEtiapine 25 MG tablet Take 1 tablet (25 mg) by mouth at bedtime. 08/12/19   Loleta Books, ARNP   sucralfate 1 g tablet TAKE 1 TABLET(1 GRAM) BY MOUTH  TWICE DAILY 09/22/19   Johnston Ebbs, ARNP       Review of patient's allergies indicates:  Allergies   Allergen Reactions   . Amoxicillin      Other reaction(s): Undetermined  Unsure from childhood    . Cucumis Skin: Itching   . Gabapentin      Has made her feel sick to her stomach the times she has tried taking this since bariatric surgery    . Ibuprofen      'I can't take because of my gastric bypass surgery'    . Penicillin G    . Penicillins      Other reaction(s): Undetermined  Unsure from childhood        Social History:   Social History     Tobacco Use   . Smoking status: Never Smoker   . Smokeless tobacco: Never Used   Substance Use Topics   . Alcohol use: No     Comment: Date Quit: Aug 2015   . 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  Moderately increased left ventricular size, with normal wall thickness, systolic function and regional wall motion. Impaired relaxation.  Normal diastolic function.  Normal RV size and systolic function. Normal estimated right atrial pressure. Unable to estimate pulmonary artery systolic pressure due to inadequate tricuspid regurgitation.   No significant valvular pathology.   Normal root and ascending aorta.  No pericardial effusion.  No priors for comparison.    09/26/2019 - EKG (cardiology note 09/26/19)  Baseline artifact. Normal sinus rhythm. Left anterior fascicular block. Abnormal ECG. When compared with ECG of 07/05/2012, no significant change. EKG from 10/16/2017 reviewed. 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    Anesthesia Plan    PAT Discussion    Assessment / Additional Concerns: COVID19 NP swab negative 10/18/19    Supervising Provider - Day of Procedure  ASA 2     Planned Anesthetic Type: general    Anesthetic plan and risks discussed with patient.  Use of blood products discussed with who consented to blood products.

## 2019-10-20 NOTE — Telephone Encounter (Signed)
Referral sent to [facility] without chart notes. Nothing further needed for the Referral Team. Closing TE.    Your fax has been successfully sent to [facility] at [phone].  ------------------------------------------------------------  From: Referral Team  ------------------------------------------------------------  12:13:15 PM 10/19/2019              Fax information edited by [facility]    10/19/2019 12:13:36 PM Transmission Record              Sent to [phone] with remote ID "[email or web address]"              Inbound user ID [email or web address], routing code 100              Result: (0/339;0/0) Success              Page record: 1 - 7              Elapsed time: 04:59 on channel 4

## 2019-10-21 ENCOUNTER — Encounter (HOSPITAL_BASED_OUTPATIENT_CLINIC_OR_DEPARTMENT_OTHER): Payer: Self-pay | Admitting: Gastroenterology

## 2019-10-21 ENCOUNTER — Ambulatory Visit
Admission: RE | Admit: 2019-10-21 | Discharge: 2019-10-21 | Disposition: A | Discharge status: Home / Self Care | Payer: Medicare HMO | Attending: Gastroenterology | Admitting: Gastroenterology

## 2019-10-21 ENCOUNTER — Ambulatory Visit (HOSPITAL_BASED_OUTPATIENT_CLINIC_OR_DEPARTMENT_OTHER): Payer: Medicare HMO | Admitting: Anesthesiology

## 2019-10-21 ENCOUNTER — Encounter (HOSPITAL_COMMUNITY): Payer: Self-pay | Admitting: Gastroenterology

## 2019-10-21 ENCOUNTER — Ambulatory Visit (HOSPITAL_COMMUNITY): Payer: Medicare HMO | Admitting: Anesthesiology

## 2019-10-21 DIAGNOSIS — R1013 Epigastric pain: Secondary | ICD-10-CM | POA: Insufficient documentation

## 2019-10-21 DIAGNOSIS — Z9884 Bariatric surgery status: Secondary | ICD-10-CM | POA: Insufficient documentation

## 2019-10-21 DIAGNOSIS — R1310 Dysphagia, unspecified: Secondary | ICD-10-CM

## 2019-10-21 DIAGNOSIS — K229 Disease of esophagus, unspecified: Secondary | ICD-10-CM | POA: Insufficient documentation

## 2019-10-21 MED ORDER — GLUCAGON HCL RDNA (DIAGNOSTIC) 1 MG IJ SOLR
1.0000 mg | INTRAMUSCULAR | Status: DC | PRN
Start: 2019-10-21 — End: 2019-10-21

## 2019-10-21 MED ORDER — LIDOCAINE HCL 4 % EX SOLN
CUTANEOUS | Status: DC | PRN
Start: 2019-10-21 — End: 2019-10-21
  Administered 2019-10-21: 4 mL via TOPICAL

## 2019-10-21 MED ORDER — ACETAMINOPHEN 325 MG OR TABS
650.0000 mg | ORAL_TABLET | ORAL | Status: DC
Start: 2019-10-21 — End: 2019-10-23

## 2019-10-21 MED ORDER — EPHEDRINE SULFATE-NACL 25-0.9 MG/5ML-% IV SOSY
PREFILLED_SYRINGE | INTRAVENOUS | Status: DC | PRN
Start: 2019-10-21 — End: 2019-10-21
  Administered 2019-10-21 (×2): 10 mg via INTRAVENOUS

## 2019-10-21 MED ORDER — ALBUTEROL SULFATE (2.5 MG/3ML) 0.083% IN NEBU
2.5000 mg | INHALATION_SOLUTION | Freq: Once | RESPIRATORY_TRACT | Status: DC
Start: 2019-10-21 — End: 2019-10-23

## 2019-10-21 MED ORDER — FENTANYL CITRATE (PF) 100 MCG/2ML IJ SOLN
12.5000 ug | INTRAMUSCULAR | Status: DC | PRN
Start: 2019-10-21 — End: 2019-10-23

## 2019-10-21 MED ORDER — LIDOCAINE HCL URETHRAL/MUCOSAL 2 % EX GEL
Freq: Once | CUTANEOUS | Status: DC | PRN
Start: 2019-10-21 — End: 2019-10-21

## 2019-10-21 MED ORDER — LACTATED RINGERS IV SOLN
100.0000 mL/h | INTRAVENOUS | Status: DC
Start: 2019-10-21 — End: 2019-10-23
  Administered 2019-10-21: 100 mL/h via INTRAVENOUS

## 2019-10-21 MED ORDER — ONDANSETRON HCL 4 MG/2ML IJ SOLN
INTRAMUSCULAR | Status: DC | PRN
Start: 2019-10-21 — End: 2019-10-21
  Administered 2019-10-21: 4 mg via INTRAVENOUS

## 2019-10-21 MED ORDER — ONDANSETRON HCL 4 MG/2ML IJ SOLN
4.0000 mg | INTRAMUSCULAR | Status: DC | PRN
Start: 2019-10-21 — End: 2019-10-23

## 2019-10-21 MED ORDER — DEXTROSE 50 % IV SOLN
50.0000 mL | INTRAVENOUS | Status: DC | PRN
Start: 2019-10-21 — End: 2019-10-21

## 2019-10-21 MED ORDER — SUCCINYLCHOLINE CHLORIDE 200 MG/10ML IV SOSY
PREFILLED_SYRINGE | INTRAVENOUS | Status: DC | PRN
Start: 2019-10-21 — End: 2019-10-21
  Administered 2019-10-21: 140 mg via INTRAVENOUS

## 2019-10-21 MED ORDER — ATROPINE SULFATE 1 MG/10ML IJ SOSY
0.5000 mg | PREFILLED_SYRINGE | INTRAMUSCULAR | Status: DC | PRN
Start: 2019-10-21 — End: 2019-10-23

## 2019-10-21 MED ORDER — SODIUM CHLORIDE 0.9 % IV SOLN
125.0000 mL/h | INTRAVENOUS | Status: AC
Start: 2019-10-21 — End: 2019-10-21

## 2019-10-21 MED ORDER — PHENYLEPHRINE HCL-NACL 1-0.9 MG/10ML-% IV SOSY
PREFILLED_SYRINGE | INTRAVENOUS | Status: DC | PRN
Start: 2019-10-21 — End: 2019-10-21
  Administered 2019-10-21: 100 ug via INTRAVENOUS

## 2019-10-21 MED ORDER — DEXTROSE 50 % IV SOLN
25.0000 mL | INTRAVENOUS | Status: DC | PRN
Start: 2019-10-21 — End: 2019-10-21

## 2019-10-21 MED ORDER — LIDOCAINE HCL (PF) 2 % IJ SOLN (OB ANESTHESIA OSM ONLY)
INTRAMUSCULAR | Status: DC | PRN
Start: 2019-10-21 — End: 2019-10-21
  Administered 2019-10-21: 80 mL via EPIDURAL

## 2019-10-21 MED ORDER — INSULIN LISPRO 100 UNIT/ML IJ SOLN
2.0000 [IU] | INTRAMUSCULAR | Status: DC | PRN
Start: 2019-10-21 — End: 2019-10-21

## 2019-10-21 MED ORDER — LACTATED RINGERS IV SOLN
INTRAVENOUS | Status: DC | PRN
Start: 2019-10-21 — End: 2019-10-21

## 2019-10-21 MED ORDER — DEXAMETHASONE SOD PHOSPHATE PF 10 MG/ML IJ SOLN
INTRAMUSCULAR | Status: DC | PRN
Start: 2019-10-21 — End: 2019-10-21
  Administered 2019-10-21: 10 mg via INTRAVENOUS

## 2019-10-21 MED ORDER — EPINEPHRINE 1 MG/10ML IJ SOSY
1.0000 mg | PREFILLED_SYRINGE | INTRAVENOUS | Status: DC | PRN
Start: 2019-10-21 — End: 2019-10-21

## 2019-10-21 MED ORDER — ATROPINE SULFATE 1 MG/10ML IJ SOSY
0.5000 mg | PREFILLED_SYRINGE | INTRAMUSCULAR | Status: DC | PRN
Start: 2019-10-21 — End: 2019-10-21

## 2019-10-21 MED ORDER — LABETALOL HCL 5 MG/ML IV SOLN
2.5000 mg | INTRAVENOUS | Status: DC | PRN
Start: 2019-10-21 — End: 2019-10-23

## 2019-10-21 MED ORDER — ONDANSETRON HCL 4 MG/2ML IJ SOLN
4.0000 mg | Freq: Three times a day (TID) | INTRAMUSCULAR | Status: DC | PRN
Start: 2019-10-21 — End: 2019-10-21

## 2019-10-21 MED ORDER — FENTANYL CITRATE (PF) 100 MCG/2ML IJ SOLN
INTRAMUSCULAR | Status: AC
Start: 2019-10-21 — End: ?
  Filled 2019-10-21: qty 2

## 2019-10-21 MED ORDER — NALOXONE HCL 0.4 MG/ML IJ SOLN
0.0400 mg | INTRAMUSCULAR | Status: DC | PRN
Start: 2019-10-21 — End: 2019-10-23

## 2019-10-21 MED ORDER — HEPARIN SOD (PORK) LOCK FLUSH 100 UNIT/ML IV SOLN
5.0000 mL | Freq: Once | INTRAVENOUS | Status: DC | PRN
Start: 2019-10-21 — End: 2019-10-23

## 2019-10-21 MED ORDER — ETHANOLAMINE OLEATE 5 % IV SOLN
2.0000 mL | INTRAVENOUS | Status: DC | PRN
Start: 2019-10-21 — End: 2019-10-21

## 2019-10-21 MED ORDER — LACTATED RINGERS BOLUS
500.0000 mL | Freq: Once | INTRAVENOUS | Status: DC | PRN
Start: 2019-10-21 — End: 2019-10-23

## 2019-10-21 MED ORDER — FENTANYL CITRATE (PF) 50 MCG/ML IJ SOLN WRAPPER (ANESTHESIA OSM ONLY)
INTRAMUSCULAR | Status: DC | PRN
Start: 2019-10-21 — End: 2019-10-21
  Administered 2019-10-21 (×2): 50 ug via INTRAVENOUS

## 2019-10-21 MED ORDER — GLUCAGON HCL RDNA (DIAGNOSTIC) 1 MG IJ SOLR
0.5000 mg | INTRAMUSCULAR | Status: DC | PRN
Start: 2019-10-21 — End: 2019-10-21

## 2019-10-21 MED ORDER — PROPOFOL 10 MG/ML IV EMUL WRAPPER (OSM ONLY)
INTRAVENOUS | Status: DC | PRN
Start: 2019-10-21 — End: 2019-10-21
  Administered 2019-10-21: 170 mg via INTRAVENOUS

## 2019-10-21 MED ORDER — GLUCAGON HCL RDNA (DIAGNOSTIC) 1 MG IJ SOLR
0.2500 mg | INTRAMUSCULAR | Status: DC | PRN
Start: 2019-10-21 — End: 2019-10-21

## 2019-10-21 NOTE — Procedure Nursing Note (Signed)
EGD, GA, bx, no dilation

## 2019-10-21 NOTE — Anesthesia Postprocedure Evaluation (Signed)
Patient: [facility]    Procedure Summary     Date: 10/21/19 Room / Location: [facility]    Anesthesia Start: 0843 Anesthesia Stop: 0926    Procedure: EGD Diagnosis:       Dysphagia, unspecified      Epigastric pain      (Dysphagia)      (OTHER)    Scheduled Providers: Sumner Boast, MD; Sherol Dade, MD; Irven Shelling, CRNA Responsible Provider: Sherol Dade, MD    Anesthesia Type: general ASA Status: Not recorded        Final Anesthesia Type: general    Vitals Value Taken Time   BP 110/97 10/21/19 0940   Temp 36.3 C 10/21/19 0922   Pulse 77 10/21/19 0948   SpO2 100 % 10/21/19 0948   Vitals shown include unvalidated device data.    Place of evaluation: PACU    Patient participation: patient participated    Level of consciousness: fully conscious    Patient pain control satisfaction: patient is satisfied with level of pain control    Airway patency: patent    Cardiovascular status during assessment: stable    Respiratory status during assessment: breathing comfortably    Anesthetic complications: no    Intravascular volume status assessment: euvolemic    Nausea / vomiting: patient is not experiencing nausea      Planned post-operative disposition at time of assessment: ward care

## 2019-10-21 NOTE — Anesthesia Procedure Notes (Signed)
PERIPHERAL IV    Staff:  Performing provider: Sherol Dade, MD  Authorizing provider: Sherol Dade, MD    Procedure Indication/Location:  Patient location: OR/Procedural area  Indication for IV: for care provided by anesthesia    Preparation:  Site prep: alcohol 70%  Skin local anesthetic used: local    Placement:  Laterality: right  Location: foot  Size: 24 G  Total attempts for all operators: 1
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
## 2019-10-21 NOTE — Anesthesia Procedure Notes (Signed)
Airway Placement    Staff:  Performing Provider: Irven Shelling, CRNA  Authorizing Provider: Sherol Dade, MD    Airway management:   Patient location: OR/Procedural area  Final airway type: endotracheal airway  Intubation reason: general anesthesia    Induction:  Positioning: supine  Patient was pre-oxygenated: yes  Mask Ventilation: Grade 1 - Ventilated by mask     Intubation:  Attempt 1  Airway Type: ETT  Primary Laryngoscopy: Colin Ina Size: 2  Laryngoscopic View: Grade I  ETT Type: standard, cuffed  ETT Route: oral  Size: 6.5  ETT secured with adhesive tape  Depth at: lips  (21cm)    Number of Attempts: 1    Assessment:  Confirmation: waveform capnography  Procedure Abandoned: no    Date / Time Airway Secured / Re-Secured:  10/21/2019 8:58 AM    Final procedure comments:  Pt ramped up on blankets and pillows

## 2019-10-21 NOTE — Procedure Nursing Note (Signed)
Patient emerged from anesthesia within minutes.  Ice chips/PO fluids given and tolerated well without nausea.  All vitals stable in PACU.  Sign out completed with anesthesia PACU resident.  Pt transferred to GI floor in stable condition.  See flowsheets for detailed assessment.

## 2019-10-25 NOTE — Addendum Note (Signed)
Encounter addended by: Dinah Beers on: 10/25/2019 7:44 AM   Actions taken: SmartForm saved

## 2019-10-26 LAB — PATHOLOGY, SURGICAL

## 2019-10-26 NOTE — Addendum Note (Signed)
Encounter addended by: Dinah Beers on: 10/26/2019 12:19 PM   Actions taken: Charge Capture section accepted

## 2019-10-27 ENCOUNTER — Encounter (HOSPITAL_BASED_OUTPATIENT_CLINIC_OR_DEPARTMENT_OTHER): Payer: Self-pay | Admitting: Gastroenterology

## 2019-10-27 NOTE — Result Encounter Note (Signed)
No celiac, no H pylori, no EoE, no esophageal reflux  ecare to patient  Fu with Elmer Sow in GI clinic

## 2019-10-28 ENCOUNTER — Encounter (HOSPITAL_BASED_OUTPATIENT_CLINIC_OR_DEPARTMENT_OTHER): Payer: Self-pay

## 2019-10-31 NOTE — Progress Notes (Signed)
Review of Systems   Constitutional: Positive for activity change.   HENT: Negative.    Eyes: Negative.    Respiratory: Negative.    Cardiovascular: Negative.    Gastrointestinal: Positive for abdominal pain.   Endocrine: Negative.    Genitourinary: Negative.    Musculoskeletal: Positive for arthralgias, back pain, gait problem and myalgias.   Skin: Negative.    Allergic/Immunologic: Negative.    Hematological: Positive for adenopathy.   Psychiatric/Behavioral: Negative.        Date PainTracker completed:  10/30/2019    Pain Tracker Scores  ORT Score     -           10/26  AUDIT-C Score -        0/12  PHQ-9 Score  -           9/27  SI Score          -           0/3  GAD-7:           -           3/21  PTSD Score   -           0/4  STOP Score   -           1/4  FM Score       -            11/31    Pain intensity & Interference (lower is better)  Pain intensity              8/10     Pain Interference        10/10    Sleep (lower is better)  Sleep Initiation            7/10  Sleep Maintenance     8/10    ODI & Important Activity Difficulty (lower is better)  ODI                             50/100  Activity Difficulty         7/10    QOL & Satisfaction   Quality of Life                 10/10  Treatment Satisfaction   0/10    Days With Excess Meds (Last Month): None    Important Activity:  Housework    Most Bothersome Side Effect: No

## 2019-10-31 NOTE — Progress Notes (Signed)
[facility]    REFERRED BY:   Loleta Books, ARNP  [phone] [address]  [place],  [place] [address]  REFERRED FOR: Evaluation of "44 year old female with chronic pain in legs, hands, back. 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 Chronic low back pain. Carpal tunnel in both hands. Patient states "I hurt all over." "    HISTORY OF PRESENT ILLNESS  Sharon Stephens is a 44 year old female w/ morbid obesity (BMI >65), OSA, gastric bypass 2011, depression w/ h/o suicide attempt, h/o polysubstance abuse who presents for evaluation of her multiple joint pain. Of note, regarding gastric bypass she lost about 300 pounds but has gained back around 100 pounds.     She has multiple complaints, in order of severity:  1) Bilateral hands which she attributes to carpal tunnel syndrome. Constant, 8/10 in severity. Reports numbness and weakness. She is in the process of getting wrist braces and is scheduled for EMG.   2) Bilateral low back pain which radiates to her buttock but not down her legs. Constant, 6/10 in severity.   3) Bilateral feet, diagnosed with plantar fasciitis. Constant, 2/10 but worsens throughout day.     She reports a few episodes of bowel incontinence (few times/year) when low back pain is especially severe. Denies any saddle anesthesia, bladder issues, recent fever/chills, weight loss.     Mood: "good," does not have regular psychiatry of counselor follow-up  Sleep: poor, having another sleep study done; hand pain wakes her up at night  Activity/exercise:     Current pain and related condition therapies: bupropion 150 (prescribed by PCP), seroquel 25 (has been on for 3 years)  Other specialists/providers who have seen/evaluated her for pain complaints include Dr. Renaldo Reel (ortho hand)  Treatment for this pain complaint and associated symptoms has included:   INTERVENTIONS: gastric bypass 2011 (lost 300 pounds, but gained 100 pounds back)   THERAPIES:   MEDICATIONS: gabapentin (nausea),  duloxetine (GI upset), sertraline; has not tried pregabalin but she does note difficulty w/ taking capsule medications since bariatric surgery   OTHER TREATMENTS, INCLUDING COMPLEMENTARY:     Diagnostic and radiologic tests/results:  CT L-spine 11/04/17  1. No evidence of large, displaced, acute fracture involving the lumbar spine.   The possibility of minimally displaced, subtle fractures, particularly   involving the transverse processes, cannot be entirely excluded.  2. Multilevel degenerative changes as above described.      Date PainTracker completed:  10/30/2019  Pain Tracker Scores  ORT Score -  10/26  AUDIT-C Score - 0/12  PHQ-9 Score - 9/27  SI Score - 0/3  GAD-7: - 3/21  PTSD Score - 0/4  STOP Score - 1/4  FM Score       -            11/31    Pain intensity & Interference (lower is better)  Pain intensity8/10   Pain Interference 10/10    Sleep (lower is better)  Sleep Initiation            7/10  Sleep Maintenance 8/10    ODI & Important Activity Difficulty (lower is better)  ODI 50/100  Activity Difficulty 7/10    QOL & Satisfaction   Quality of Life  10/10  Treatment Satisfaction 0/10    Days With Excess Meds (Last Month): None    Important Activity: Housework    Most Bothersome Side Effect: No  PAST HISTORY  Past Medical and Surgical History  Past Medical History:   Diagnosis Date   . Allergic rhinitis, cause unspecified    . Chronic obstructive asthma, unspecified ([place])    . Depressive disorder, not elsewhere classified    . Esophageal reflux    . Essential hypertension, benign    . Generalized osteoarthrosis, unspecified site    . Headache    . Hernia of other specified sites of abdominal cavity without mention of obstruction or gangrene     notes having 3 in abdomen    . Morbid obesity ([place])    . Thyroid disease     . Type II or unspecified type diabetes mellitus without mention of complication, not stated as uncontrolled     broaderline    . Unspecified sleep apnea    . Wound infection      Past Surgical History:   Procedure Laterality Date   . ADRENALECTOMY     . bariatric surgery  2011   . BARIATRIC SURGERY     . c-sections     . gallbladder removed     . PR APPENDECTOMY     . PR CESAREAN DELIVERY ONLY     . PR CHOLECYSTECTOMY     . PR GASTRIC RSTCV W/BYP W/SM INT RCNSTJ LIMIT ABSRPJ     . PR LIG/TRNSXJ FLP TUBE ABDL/VAG APPR UNI/BI     . PR TONSILLECTOMY ONE-HALF <AGE 33     . tube tied       Family History   Family History     Problem (# of Occurrences) Relation (Name,Age of Onset)    Alcohol Abuse (2) Father, Paternal Medical laboratory scientific officer (2) Mother, Father    Arthritis (38) Mother, Maternal Grandmother, Maternal Grandfather, Paternal 4, Paternal 25, Father, Maternal 81, Maternal Uncle, Paternal 50, Paternal Uncle    Asthma (2) Maternal Grandmother, Paternal Grandmother    Breast Cancer (1) Aunt/Uncle    Cancer (2) Maternal Grandmother, Maternal Grandfather    Depression (1) Maternal Grandmother    Diabetes (5) Mother, Maternal Grandmother, Maternal Grandfather, Paternal 54, Paternal Aunt    Drug Abuse (1) Paternal Aunt    Early Sudden Death (71) Mother    Fibromyalgia (2) Paternal 39, Paternal Uncle    GU (22) Paternal Grandmother    Heart Disease (3) Mother, Maternal Grandfather, Paternal Grandmother    Hypertension (62) Maternal Grandmother, Maternal Grandfather, Paternal 56, Paternal Grandfather    Lipids (2) Maternal Grandfather, Aunt/Uncle    Mental Illness (2) Maternal Uncle, Paternal Aunt    Migraine Headaches (11) Maternal Grandmother, Maternal Uncle, Paternal 4, Paternal Uncle    Miscarriages (2) Mother, Aunt/Uncle    Stroke (2) Maternal Grandfather, Paternal Grandfather        Social Histor  Social History     Tobacco Use   . Smoking status: Never Smoker   . Smokeless  tobacco: Never Used   Substance Use Topics   . Alcohol use: No     Comment: Date Quit: Aug 2015   . Drug use: Never     Social History     Social History Narrative    Lives in [place] with friends and children. Moved here to "get aware from my kid's father". Denies hx of abuse. Former smoker, denies TED at current. Unemployed.    History of alcoholism, quit 2018.     Female partner    Previously worked as a Scientist, water quality     Medications  Current Outpatient  Medications   Medication Sig Dispense Refill   . Acetaminophen 325 MG Oral Tab Take 1 tablet (325 mg) by mouth every 6 hours as needed for pain. For Pain. 50 tablet 3   . albuterol HFA 108 (90 Base) MCG/ACT inhaler INHALE 2 PUFFS BY MOUTH EVERY 6 HOURS AS NEEDED FOR SHORTNESS OF BREATH OR WHEEZING OR COUGH 25.5 g 1   . buPROPion XL 150 MG 24 hr tablet Take 1 tablet (150 mg) by mouth daily. 30 tablet 2   . cetirizine 10 MG tablet Take by mouth.     . fluticasone propionate 50 MCG/ACT nasal spray Spray 2 sprays into each nostril daily. (Patient not taking: Reported on 11/01/2019) 16 g 5   . HYDROcodone-acetaminophen 5-325 MG tablet Take 1-2 tablets by mouth every 4 hours as needed for severe pain. Do not exceed 8 tablets per day. (Patient not taking: Reported on 11/01/2019) 10 tablet 0   . Levonorgestrel 20 MCG/24HR Intrauterine IUD 1 Intra Uterine Device by Intrauterine route One time.     . meloxicam 7.5 MG tablet TAKE 1 TABLET BY MOUTH EVERY DAY AS NEEDED FOR PAIN (Patient not taking: Reported on 11/01/2019) 90 tablet 0   . metoprolol succinate ER 25 MG 24 hr tablet Take 1 tablet (25 mg) by mouth daily. Do not chew or crush. 90 tablet 3   . omeprazole 20 MG DR capsule Take 1 capsule (20 mg) by mouth daily on an empty stomach. 90 capsule 0   . QUEtiapine 25 MG tablet Take 1 tablet (25 mg) by mouth at bedtime. 90 tablet 3   . sucralfate 1 g tablet TAKE 1 TABLET(1 GRAM) BY MOUTH TWICE DAILY (Patient not taking: Reported on 11/01/2019) 180 tablet 0     No current  facility-administered medications for this visit.      Allergies  Amoxicillin, Cucumis, Gabapentin, Ibuprofen, Penicillin g, and Penicillins    Past medical, surgical, family, and social history, as well as medications and allergies as above were reviewed and updated personally with Ms. Tinkey.     PRESCRIPTION MONITORING PROGRAM  [place] Medication Dispense History (from 11/06/2018 to 11/01/2019)     Dispensed Days Supply Quantity   HYDROCODONE-ACETAMIN 5-325 MG 08/31/2019 3 10 unspecified   HYDROCODONE-ACETAMIN 5-325 MG 08/31/2019 3 10 unspecified   HYDROCODONE-ACETAMIN 5-325 MG 08/20/2019 2 10 unspecified   HYDROCODONE-ACETAMIN 5-325 MG 08/20/2019 2 10 unspecified   HYDROCODONE-ACETAMIN 5-325 MG 08/12/2019 1 10 unspecified   HYDROCODONE-ACETAMIN 5-325 MG 08/12/2019 1 10 unspecified   HYDROCODONE-ACETAMIN 5-325 MG 08/02/2019 2 10 unspecified   HYDROCODONE-ACETAMIN 5-325 MG 08/02/2019 2 10 unspecified   HYDROCODONE-ACETAMIN 5-325 MG 07/31/2019 3 15 unspecified   HYDROCODONE-ACETAMIN 5-325 MG 07/31/2019 3 15 unspecified   HYDROCODONE-ACETAMIN 5-325 MG 06/16/2019 7 10 unspecified   HYDROCODONE-ACETAMIN 5-325 MG 06/16/2019 7 10 unspecified       REVIEW OF SYSTEMS  The Review of Systems (ROS) was provided by Ms. Mccown and entered in the MA note associated with this encounter. I personally reviewed and confirmed the ROS in the record with the patient today.    PHYSICAL EXAM  BP (P) 137/85   Pulse (P) 71   Temp (P) 35.7 C (Temporal)   Resp (P) 12   Ht (P) 5\' 6"  (1.676 m)   Wt (!) (P) 182.8 kg (403 lb)   SpO2 (P) 98%   BMI (P) 65.05 kg/m   General: no distress, cooperative, obese woman  HEENT: atraumatic, normocephalic  CV: no cyanosis, no JVD,  no LE edema  Resp: no distress, no SOB  GI: soft, NT/ND  Psych: alert and oriented; affect, mood, judgment normal  Skin: negative on exposed areas and areas of pain  MSK:  Gait: hesitant, shuffling  Squat: unable to squat and rise   Myofascial tenderness: tenderness along low  lumbar paraspinals and R >L PSIS  Spine:  Lumbar: full flexion/extension w/ some pain in low lumbar back, facet loading negative    Neuro:  Strength: 5/5 in all muscle groups of upper and lower extremities except 3/5 strength in bilateral hands   Sensory: normal to light touch in all extremities except numbness in bilateral palms  Allodynia in R >L hand in median nerve distribution (thumb, 2nd and 3rd fingers)    IMPRESSION  (G89.29) Other chronic pain  (E66.01) Morbid obesity (HCC)  (Z98.84) History of bariatric surgery  (M17.11) Primary osteoarthritis of right knee  (G56.03) Bilateral carpal tunnel syndrome  (M54.40,  G89.29) Chronic right-sided low back pain with sciatica, sciatica laterality unspecified    Pershing ProudJulia Raye Croucher is a 44 year old female w/ morbid obesity (BMI >65), OSA, gastric bypass 2011, depression w/ h/o suicide attempt, h/o polysubstance abuse who presents for evaluation of her multiple joint pain and bilateral hand pain. Her bilateral hand pain is most significant component of her pain.     Exam and history consistent w/ bilateral carpal tunnel syndrome; numbness and allodynia in bilateral median nerve distribution R>L, significant weakness with bilateral thumb and finger movements. Agree w/ EMG to further evaluate and continue F/U with hand surgery to discuss next steps.     She asked specifically about a "pain medication." Discussed that opioids are not indicated for long-standing chronic pain, with risks including, but not limited to, physical dependence, mental status changes, mood disturbances, tolerance, addiction, and increased pain, which at times represents opioid induced hyperalgesia. Consider trial of low-dose TCA as below.     Discussed that her other pains (low back, knees, feet) are most likely exacerbated by her weight. Emphasized importance of remaining physically active despite her hand pain. She is open to physical therapy to address low back pain and determine effective home  exercise regimen.     PLAN  We recommend a multimodal approach to pain management for all patients. Discussed with Ms. Howatt the following plan extensively, and all questions were answered.     Diagnostics: agree w/ EMG to evaluate bilateral hands  Medication: By clinic policy we do not prescribe and are therefore grateful if further medication management is done with the patient's PCP.  - Consider trial of nortriptyline for neuropathic pain and sleep. Would trial at low dose. Start at 10mg  for 2 weeks. Then increase to 25mg , therapeutic trial for 4-6 weeks. If ineffective after that time, can taper off  - Discuss this in conjunction with cardiology given her history of supraventricular tachycardia and small risk of arrhythmia given anticholinergic activity of nortriptyline  - Discussed with pt that weight gain is a potential side effect of TCA therapy, however, at this low dose it is unlikely and improvement in sleep and pain will ideally allow her to increase her activity. Regardless would continue to monitor overall weight trend carefully     Rehabilitation:   - PT referral placed. Physical therapy can reduce pain and improve functional status.  We discussed a physical therapy approach focused on gradual, progressive stretching and strengthening and developing an independent home exercise program.   - Advised her to wear wrist braces  at night for carpal tunnel    Mental health: none indicated at this time    Coordination of care: RTC once next steps are determined for carpal tunnel   - Continue F/U with ortho hand    I thank the referring provider Loleta Books for involving [place] in the care of this patient.     Emi Belfast, MD  Pain Medicine Fellow

## 2019-11-01 ENCOUNTER — Ambulatory Visit: Payer: Medicare HMO | Attending: Pain Management | Admitting: Anesthesiology

## 2019-11-01 ENCOUNTER — Encounter (HOSPITAL_BASED_OUTPATIENT_CLINIC_OR_DEPARTMENT_OTHER): Payer: Self-pay | Admitting: Anesthesiology

## 2019-11-01 DIAGNOSIS — G5603 Carpal tunnel syndrome, bilateral upper limbs: Secondary | ICD-10-CM | POA: Insufficient documentation

## 2019-11-01 DIAGNOSIS — G8929 Other chronic pain: Secondary | ICD-10-CM | POA: Insufficient documentation

## 2019-11-01 DIAGNOSIS — M1711 Unilateral primary osteoarthritis, right knee: Secondary | ICD-10-CM | POA: Insufficient documentation

## 2019-11-01 DIAGNOSIS — M5440 Lumbago with sciatica, unspecified side: Secondary | ICD-10-CM | POA: Insufficient documentation

## 2019-11-01 DIAGNOSIS — Z9884 Bariatric surgery status: Secondary | ICD-10-CM | POA: Insufficient documentation

## 2019-11-01 NOTE — Patient Instructions (Addendum)
We recommend a multimodal approach to pain management for all patients. Discussed with Sharon Stephens the following plan extensively, and all questions were answered.     Diagnostics: agree w/ EMG to evaluate bilateral hands  Medication: By clinic policy we do not prescribe and are therefore grateful if further medication management is done with the patient's PCP.  - Consider trial of nortriptyline for neuropathic pain and sleep. Would trial at low dose. Start at 10mg  for 2 weeks. Then increase to 25mg , therapeutic trial for 4-6 weeks. If ineffective after that time, can taper off    Rehabilitation:   - PT referral placed. Physical therapy can reduce pain and improve functional status.  We discussed a physical therapy approach focused on gradual, progressive stretching and strengthening and developing an independent home exercise program.     Mental health: none indicated at this time    Coordination of care: RTC once next steps are determined for carpal tunnel. Call [phone] to schedule follow-up appointment.  - Continue F/U with ortho hand

## 2019-11-03 ENCOUNTER — Encounter (HOSPITAL_BASED_OUTPATIENT_CLINIC_OR_DEPARTMENT_OTHER): Payer: Self-pay | Admitting: Anesthesiology

## 2019-11-03 ENCOUNTER — Other Ambulatory Visit (INDEPENDENT_AMBULATORY_CARE_PROVIDER_SITE_OTHER): Payer: Self-pay | Admitting: Family

## 2019-11-03 DIAGNOSIS — F322 Major depressive disorder, single episode, severe without psychotic features: Secondary | ICD-10-CM

## 2019-11-03 DIAGNOSIS — Z9884 Bariatric surgery status: Secondary | ICD-10-CM

## 2019-11-03 DIAGNOSIS — F321 Major depressive disorder, single episode, moderate: Secondary | ICD-10-CM

## 2019-11-03 NOTE — Telephone Encounter (Signed)
Patient has appointment tomorrow with PCP. Will have provider complete at that time.

## 2019-11-03 NOTE — Addendum Note (Signed)
Addended by: Eliezer Mccoy on: 11/03/2019 08:55 PM     Modules accepted: Level of Service

## 2019-11-04 ENCOUNTER — Telehealth (INDEPENDENT_AMBULATORY_CARE_PROVIDER_SITE_OTHER): Payer: Medicare HMO | Admitting: Family

## 2019-11-04 DIAGNOSIS — F321 Major depressive disorder, single episode, moderate: Secondary | ICD-10-CM

## 2019-11-04 DIAGNOSIS — F322 Major depressive disorder, single episode, severe without psychotic features: Secondary | ICD-10-CM

## 2019-11-04 DIAGNOSIS — M79672 Pain in left foot: Secondary | ICD-10-CM

## 2019-11-04 DIAGNOSIS — M5440 Lumbago with sciatica, unspecified side: Secondary | ICD-10-CM

## 2019-11-04 DIAGNOSIS — Z9884 Bariatric surgery status: Secondary | ICD-10-CM

## 2019-11-04 DIAGNOSIS — M62838 Other muscle spasm: Secondary | ICD-10-CM

## 2019-11-04 DIAGNOSIS — M25512 Pain in left shoulder: Secondary | ICD-10-CM

## 2019-11-04 DIAGNOSIS — G8929 Other chronic pain: Secondary | ICD-10-CM

## 2019-11-04 DIAGNOSIS — M1711 Unilateral primary osteoarthritis, right knee: Secondary | ICD-10-CM

## 2019-11-04 DIAGNOSIS — G629 Polyneuropathy, unspecified: Secondary | ICD-10-CM

## 2019-11-04 MED ORDER — BUPROPION HCL ER (XL) 150 MG OR TB24
150.0000 mg | EXTENDED_RELEASE_TABLET | Freq: Every day | ORAL | 3 refills | Status: DC
Start: 2019-11-04 — End: 2020-08-31

## 2019-11-04 MED ORDER — NORTRIPTYLINE HCL 10 MG OR CAPS
10.0000 mg | ORAL_CAPSULE | Freq: Every evening | ORAL | 0 refills | Status: DC
Start: 2019-11-04 — End: 2019-12-08

## 2019-11-04 MED ORDER — LIDOCAINE 5 % EX PTCH
1.0000 | MEDICATED_PATCH | CUTANEOUS | 5 refills | Status: DC
Start: 2019-11-04 — End: 2020-08-31

## 2019-11-04 MED ORDER — TIZANIDINE HCL 2 MG OR TABS
2.0000 mg | ORAL_TABLET | Freq: Three times a day (TID) | ORAL | 3 refills | Status: DC | PRN
Start: 2019-11-04 — End: 2020-08-31

## 2019-11-04 MED ORDER — QUETIAPINE FUMARATE 25 MG OR TABS
25.0000 mg | ORAL_TABLET | Freq: Every evening | ORAL | 3 refills | Status: DC
Start: 2019-11-04 — End: 2019-12-22
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
## 2019-11-04 NOTE — Progress Notes (Signed)
I saw and evaluated the patient. I have reviewed and edited the pain fellow Dr Zhang's documentation and agree with it

## 2019-11-05 ENCOUNTER — Telehealth (INDEPENDENT_AMBULATORY_CARE_PROVIDER_SITE_OTHER): Payer: Self-pay | Admitting: Family

## 2019-11-05 NOTE — Telephone Encounter (Signed)
See attached PA request  [id number]

## 2019-11-08 ENCOUNTER — Telehealth (INDEPENDENT_AMBULATORY_CARE_PROVIDER_SITE_OTHER): Payer: Self-pay | Admitting: Family

## 2019-11-08 ENCOUNTER — Other Ambulatory Visit (INDEPENDENT_AMBULATORY_CARE_PROVIDER_SITE_OTHER): Payer: Self-pay | Admitting: Medical

## 2019-11-08 DIAGNOSIS — R109 Unspecified abdominal pain: Secondary | ICD-10-CM

## 2019-11-08 NOTE — Telephone Encounter (Signed)
See attached PA request form    [id number]

## 2019-11-08 NOTE — Telephone Encounter (Signed)
OPEN IN ERROR. CLOSING TE.

## 2019-11-09 ENCOUNTER — Encounter (HOSPITAL_BASED_OUTPATIENT_CLINIC_OR_DEPARTMENT_OTHER): Payer: Self-pay

## 2019-11-09 MED ORDER — OMEPRAZOLE 20 MG OR CPDR
DELAYED_RELEASE_CAPSULE | ORAL | 2 refills | Status: DC
Start: 2019-11-09 — End: 2020-08-31

## 2019-11-09 NOTE — Telephone Encounter (Signed)
Chart notes needed to submit with PA request     Saved under KEY [id number]

## 2019-11-09 NOTE — Telephone Encounter (Signed)
2nd request receive 11/09/19 for patient Lidocaine Rx from [name]

## 2019-11-09 NOTE — Telephone Encounter (Signed)
Reviewed. Holding for pcp.

## 2019-11-17 ENCOUNTER — Other Ambulatory Visit (INDEPENDENT_AMBULATORY_CARE_PROVIDER_SITE_OTHER): Payer: Self-pay | Admitting: Family

## 2019-11-17 ENCOUNTER — Encounter (INDEPENDENT_AMBULATORY_CARE_PROVIDER_SITE_OTHER): Payer: Self-pay | Admitting: Family

## 2019-11-17 NOTE — Telephone Encounter (Signed)
Needs appointment, please assist with scheduling.

## 2019-11-18 NOTE — Telephone Encounter (Signed)
LMTCB    CCR: Please assist patient with scheduling OV. Thank you    1st attempt

## 2019-11-19 NOTE — Telephone Encounter (Signed)
2nd attempt.    LMTCB      3rd attempt.  Mychart msg sent.

## 2019-11-21 NOTE — Telephone Encounter (Signed)
Try reaching out patient one last time and LVM.    No further action     CCR: if patient returns call please assist with scheduling OV with PCP or with any provider available. Thank you

## 2019-11-24 ENCOUNTER — Encounter (INDEPENDENT_AMBULATORY_CARE_PROVIDER_SITE_OTHER): Payer: Self-pay

## 2019-11-24 ENCOUNTER — Encounter (INDEPENDENT_AMBULATORY_CARE_PROVIDER_SITE_OTHER): Payer: Medicare HMO | Admitting: Family Medicine

## 2019-11-24 NOTE — Progress Notes (Signed)
Ms. Buonocore did not cancel and was not present for a scheduled appointment today.

## 2019-11-25 ENCOUNTER — Encounter (INDEPENDENT_AMBULATORY_CARE_PROVIDER_SITE_OTHER): Payer: Medicare HMO | Admitting: Family Medicine

## 2019-11-25 NOTE — Progress Notes (Signed)
DNKA. TC made, N/A, M/L.      This encounter was opened in error

## 2019-11-28 NOTE — Telephone Encounter (Signed)
Chart note completed

## 2019-11-28 NOTE — Telephone Encounter (Signed)
Approval Received  Insurance: [employer]   Medication: Lidocaine patches   Quantity / Frequency: As written   Dates Approved: 11/28/2019-07/06/2020   Prior Auth. Number: [id number]     Additional Notes: Authorization faxed to Pharmacy    Excerpt of Approval

## 2019-11-28 NOTE — Telephone Encounter (Signed)
PA submitted via [employer].  Will check for response in 4 business days.  If no response at that time will call insurance to check status.      Navinet KeyKarlyne Stephens - PA Case ID: [id number]

## 2019-12-02 ENCOUNTER — Telehealth (INDEPENDENT_AMBULATORY_CARE_PROVIDER_SITE_OTHER): Payer: Medicare HMO | Admitting: Family

## 2019-12-06 ENCOUNTER — Other Ambulatory Visit (INDEPENDENT_AMBULATORY_CARE_PROVIDER_SITE_OTHER): Payer: Self-pay | Admitting: Family

## 2019-12-06 DIAGNOSIS — G629 Polyneuropathy, unspecified: Secondary | ICD-10-CM

## 2019-12-06 DIAGNOSIS — G8929 Other chronic pain: Secondary | ICD-10-CM

## 2019-12-08 ENCOUNTER — Telehealth (INDEPENDENT_AMBULATORY_CARE_PROVIDER_SITE_OTHER): Payer: Medicare HMO | Admitting: Family

## 2019-12-08 DIAGNOSIS — G8929 Other chronic pain: Secondary | ICD-10-CM

## 2019-12-08 DIAGNOSIS — G629 Polyneuropathy, unspecified: Secondary | ICD-10-CM

## 2019-12-08 DIAGNOSIS — M25561 Pain in right knee: Secondary | ICD-10-CM

## 2019-12-08 MED ORDER — NORTRIPTYLINE HCL 10 MG OR CAPS
30.0000 mg | ORAL_CAPSULE | Freq: Every evening | ORAL | 5 refills | Status: DC
Start: 2019-12-08 — End: 2019-12-29

## 2019-12-08 MED ORDER — NORTRIPTYLINE HCL 10 MG OR CAPS
ORAL_CAPSULE | ORAL | 0 refills | Status: DC
Start: 2019-12-08 — End: 2019-12-08

## 2019-12-08 MED ORDER — HYDROCODONE-ACETAMINOPHEN 5-325 MG OR TABS
1.0000 | ORAL_TABLET | Freq: Four times a day (QID) | ORAL | 0 refills | Status: DC | PRN
Start: 2019-12-08 — End: 2019-12-27
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
## 2019-12-13 ENCOUNTER — Ambulatory Visit (INDEPENDENT_AMBULATORY_CARE_PROVIDER_SITE_OTHER): Payer: Medicare HMO | Admitting: Family

## 2019-12-15 ENCOUNTER — Encounter (INDEPENDENT_AMBULATORY_CARE_PROVIDER_SITE_OTHER): Payer: Medicare HMO | Admitting: Sports Medicine

## 2019-12-17 NOTE — Progress Notes (Deleted)
CARDIOLOGY CLINIC NOTE    ID / CHIEF CONCERN:  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      INTERVAL HISTORY:   [name] presents today for follow up.                                             [name] presents today for evaluation and management of her SVT.  Patient was first diagnosed with SVT in 2018 when she was driving from [place] to [place] when she noticed her heart rate was significantly elevated.  She went to ER at [facility] where she was diagnosed with SVT with HR around 200 BPM.  She was given 2 doses of adenosine to restore sinus rhythm. Her episodes used to occur every 6 months, but now occur at least 1-2 times a month in the past 6 months. Her last episode occurred two weeks ago. These episodes last up to 30 minutes. She performs vagal maneuver and "bears down" to restore normal rhythm.  She denies any associated shortness of breath or chest pain but does experience some lightheadedness and diaphoresis. No syncope.  She has had episodes that have awakened her from sleep.  She has been seen in ER several times at [facility] with SVT, most recently in 2019.     She has a history of alcohol abuse and high energy drink intake which she discontinued about 2 years ago.  Unclear if her palpitations are better since than time, but recently symptoms are worse per above.    She denies chest pain, pressure, or tightness or shortness of breath. She endorses occasional PND. She had a sleep study and used to use a CPAP machine but no longer does. She denies recent LE edema. She had bariatric surgery and lost 300 pounds but gained most of it back.    PAST MEDICAL HISTORY:  1. Supraventricular tachycardia, likely AVNRT.  2. Hypertension.  3. Depression.  4. Morbid obesity.  5. Bilateral carpel tunnel syndrome.  6. Otalgia of right ear.  7. Primary osteoarthritis  of right knee.  8. B12 deficiency.   9. Sprain of ribs.  10. Plantar fasciitis.  11. Hypokalemia.  12. Eczema.  13. Malabsorption.  14. Lower UTI.    PAST SURGICAL HISTORY:  1. Adrenalectomy.  2. Appendectomy.  3. Bariatric surgery 2011.  4. Cesarean delivery.  5. Cholecystectomy.  6. Tonsillectomy one-half. Age <14.  7. Tubes tied.     ALLERGIES:  AMOXICILLIN.  CUCUMIS.  GABAPENTIN.  IBUPROFEN.  PENICILLIN G.  PENICILLINS.     MEDICATIONS:  1. Metoprolol succinate 25 mg daily.  2. Hydrocodone-acetaminophen 5-325 mg every 6 hours as needed.  3. Omeprazole 20 mg daily.  4. Nortriptyline 30 mg daily.  5. Cetirizine 10 mg daily.  6. Albuterol inhaler    FAMILY HISTORY:  Mother died at age 62 of a heart attack. Father died from a gunshot. She has no siblings. She grew up in [place]. Updated 12/2019.     SOCIAL HISTORY:  She used to drink heavily to the point where she would pass out. She is now two years sober. Used to use tobacco but quit 20 years ago. 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  She lives in [place]. She is not married. She has two children. She is currently unemployed. Updated 12/2019.     PHYSICAL EXAMINATION:   GEN: Well-appearing, well-nourished, no acute distress.   VITAL SIGNS: There were no vitals taken for this visit.    NEUROLOGIC: Alert and oriented x3. Moves all four extremities spontaneously.  HEENT: eyes anicteric, no pallor.   NECK: JVP is flat. There are 2+ carotid upstrokes without bruits. No thyromegaly. No cervical adenopathy.  LUNGS: Clear to auscultation bilaterally. No rales or wheezes.  CARDIOVASCULAR: Regular rate and rhythm. S1, S2 are present. There are no murmurs, rubs, or gallops.  ABDOMEN: Obese, soft, nontender, nondistended with active bowel sounds. No obvious masses or bruits. No obvious hepatosplenomegaly.  EXTREMITIES: There is no cyanosis, clubbing or edema. There are 2+ distal pulses.  SKIN: There are no obvious lesions.      RESULTS:  No recent  labs.    ASSESSMENT / PLAN:  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     1. 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     2. Hypertension. BP is mildly elevated. Target <130/80 mmHg.*    3. Follow-up.                [Scribe signature] ***

## 2019-12-19 ENCOUNTER — Encounter (INDEPENDENT_AMBULATORY_CARE_PROVIDER_SITE_OTHER): Payer: Medicare HMO | Admitting: Cardiovascular Disease

## 2019-12-19 ENCOUNTER — Encounter (INDEPENDENT_AMBULATORY_CARE_PROVIDER_SITE_OTHER): Payer: Medicare HMO | Admitting: Hand Surgery

## 2019-12-22 ENCOUNTER — Other Ambulatory Visit (INDEPENDENT_AMBULATORY_CARE_PROVIDER_SITE_OTHER): Payer: Self-pay | Admitting: Family

## 2019-12-22 DIAGNOSIS — F321 Major depressive disorder, single episode, moderate: Secondary | ICD-10-CM

## 2019-12-22 DIAGNOSIS — M25561 Pain in right knee: Secondary | ICD-10-CM

## 2019-12-22 DIAGNOSIS — G8929 Other chronic pain: Secondary | ICD-10-CM

## 2019-12-22 DIAGNOSIS — F322 Major depressive disorder, single episode, severe without psychotic features: Secondary | ICD-10-CM

## 2019-12-23 MED ORDER — QUETIAPINE FUMARATE 25 MG OR TABS
25.0000 mg | ORAL_TABLET | Freq: Every evening | ORAL | 2 refills | Status: DC
Start: 2019-12-23 — End: 2020-08-31

## 2019-12-26 ENCOUNTER — Other Ambulatory Visit (INDEPENDENT_AMBULATORY_CARE_PROVIDER_SITE_OTHER): Payer: Self-pay | Admitting: Family

## 2019-12-26 DIAGNOSIS — G8929 Other chronic pain: Secondary | ICD-10-CM

## 2019-12-26 DIAGNOSIS — M25561 Pain in right knee: Secondary | ICD-10-CM

## 2019-12-26 NOTE — Telephone Encounter (Signed)
Routing to provider for consideration of refill.

## 2019-12-26 NOTE — Telephone Encounter (Signed)
Still open on our end. Please respond.

## 2019-12-27 ENCOUNTER — Other Ambulatory Visit (INDEPENDENT_AMBULATORY_CARE_PROVIDER_SITE_OTHER): Payer: Self-pay | Admitting: Family

## 2019-12-27 DIAGNOSIS — G8929 Other chronic pain: Secondary | ICD-10-CM

## 2019-12-27 DIAGNOSIS — M25561 Pain in right knee: Secondary | ICD-10-CM

## 2019-12-28 ENCOUNTER — Other Ambulatory Visit (INDEPENDENT_AMBULATORY_CARE_PROVIDER_SITE_OTHER): Payer: Self-pay | Admitting: Family

## 2019-12-28 DIAGNOSIS — G8929 Other chronic pain: Secondary | ICD-10-CM

## 2019-12-28 DIAGNOSIS — M25561 Pain in right knee: Secondary | ICD-10-CM

## 2019-12-28 MED ORDER — HYDROCODONE-ACETAMINOPHEN 5-325 MG OR TABS
1.0000 | ORAL_TABLET | Freq: Four times a day (QID) | ORAL | 0 refills | Status: DC | PRN
Start: 2019-12-28 — End: 2020-04-11

## 2019-12-28 NOTE — Telephone Encounter (Signed)
See other TE.

## 2019-12-28 NOTE — Telephone Encounter (Signed)
Small amount refilled, please make sure pt has reached out to ortho to schedule.

## 2019-12-28 NOTE — Telephone Encounter (Addendum)
#  5 requests received please respond

## 2019-12-29 ENCOUNTER — Other Ambulatory Visit (INDEPENDENT_AMBULATORY_CARE_PROVIDER_SITE_OTHER): Payer: Self-pay | Admitting: Family

## 2019-12-29 DIAGNOSIS — G629 Polyneuropathy, unspecified: Secondary | ICD-10-CM

## 2019-12-30 MED ORDER — NORTRIPTYLINE HCL 10 MG OR CAPS
30.0000 mg | ORAL_CAPSULE | Freq: Every evening | ORAL | 4 refills | Status: DC
Start: 2019-12-30 — End: 2020-08-31

## 2020-01-02 ENCOUNTER — Other Ambulatory Visit (INDEPENDENT_AMBULATORY_CARE_PROVIDER_SITE_OTHER): Payer: Self-pay | Admitting: Family Medicine

## 2020-01-02 DIAGNOSIS — G8929 Other chronic pain: Secondary | ICD-10-CM

## 2020-01-02 DIAGNOSIS — M25561 Pain in right knee: Secondary | ICD-10-CM

## 2020-01-02 NOTE — Telephone Encounter (Signed)
Hydrocodone not for long term use of pain management, patient needs to be seen if still needing medication

## 2020-01-02 NOTE — Telephone Encounter (Signed)
Left message for pt to return our call. 

## 2020-01-04 NOTE — Telephone Encounter (Signed)
Pt did not answer, left VM to call clinic back

## 2020-01-05 NOTE — Telephone Encounter (Signed)
Left message for pt to return our call.  Forward to [place], should we send ecare message?  Pt has not read the last 3 ecare messages.

## 2020-01-05 NOTE — Telephone Encounter (Signed)
Nothing further needed 

## 2020-01-16 ENCOUNTER — Telehealth (INDEPENDENT_AMBULATORY_CARE_PROVIDER_SITE_OTHER): Payer: Medicare HMO | Admitting: Family

## 2020-02-28 ENCOUNTER — Encounter (INDEPENDENT_AMBULATORY_CARE_PROVIDER_SITE_OTHER): Payer: Self-pay | Admitting: Family

## 2020-02-28 NOTE — Telephone Encounter (Signed)
Nurse triage encounter started, await patient call back.

## 2020-04-03 ENCOUNTER — Encounter (INDEPENDENT_AMBULATORY_CARE_PROVIDER_SITE_OTHER): Payer: Self-pay | Admitting: Family

## 2020-04-03 NOTE — Telephone Encounter (Signed)
Spoke to patient and was able to assist with scheduling.    No further action need it.

## 2020-04-04 ENCOUNTER — Encounter (INDEPENDENT_AMBULATORY_CARE_PROVIDER_SITE_OTHER): Payer: Self-pay | Admitting: Family

## 2020-04-04 ENCOUNTER — Encounter (INDEPENDENT_AMBULATORY_CARE_PROVIDER_SITE_OTHER): Payer: Medicare HMO | Admitting: Family

## 2020-04-04 NOTE — Telephone Encounter (Signed)
1st attempt.    Left msg that apt changed to telemed.    2nd attempt.  mychart msg sent.

## 2020-04-04 NOTE — Progress Notes (Signed)
Sharon Stephens did not cancel and was not present for a scheduled appointment today.  Disposition: Provider attempted to call patient to ask her to sign on but did not answer. Chart reviewed, recommend patient reschedule.

## 2020-04-04 NOTE — Telephone Encounter (Signed)
Noted. Will plan to follow-up with patient regarding ER visit.

## 2020-04-04 NOTE — Telephone Encounter (Signed)
Okay to convert to telemedicine, but patient may need to come in for additional work-up / evaluation at some point after the visit.    Note: needs to be telemedicine though, phone appointment not approved

## 2020-04-05 ENCOUNTER — Encounter (INDEPENDENT_AMBULATORY_CARE_PROVIDER_SITE_OTHER): Payer: Self-pay | Admitting: Family

## 2020-04-05 ENCOUNTER — Emergency Department: Payer: Self-pay

## 2020-04-05 ENCOUNTER — Encounter (INDEPENDENT_AMBULATORY_CARE_PROVIDER_SITE_OTHER): Payer: Self-pay

## 2020-04-05 NOTE — Telephone Encounter (Signed)
Needs visit, please assist with scheduling. Can override session limits next week if needed. Can also offer appt with different provider if there are no openings with me. Please schedule for 40 minutes.

## 2020-04-05 NOTE — Telephone Encounter (Signed)
1st attempt LMTCB      ONG:EXBMWU offer patient a ED follow up with a careteam member. If patient only would like to see pcp please warm transfer to the FD.    Thank you!

## 2020-04-06 NOTE — Telephone Encounter (Signed)
Per review of chart ER follow-up visit has already been scheduled with PCP for 10/15.  Closing TE

## 2020-04-06 NOTE — Telephone Encounter (Signed)
Patient has schedule [facility] 10/15 Would you like for patient to come in person instead or is it ok to keep TH.     Routing to [employer]

## 2020-04-06 NOTE — Telephone Encounter (Signed)
Okay to keep as telemedicine appointment.

## 2020-04-08 ENCOUNTER — Encounter (INDEPENDENT_AMBULATORY_CARE_PROVIDER_SITE_OTHER): Payer: Self-pay | Admitting: Family

## 2020-04-09 ENCOUNTER — Other Ambulatory Visit (INDEPENDENT_AMBULATORY_CARE_PROVIDER_SITE_OTHER): Payer: Self-pay | Admitting: Family Medicine

## 2020-04-09 DIAGNOSIS — G8929 Other chronic pain: Secondary | ICD-10-CM

## 2020-04-09 DIAGNOSIS — M25561 Pain in right knee: Secondary | ICD-10-CM

## 2020-04-09 NOTE — Telephone Encounter (Signed)
1st attempt.    Left msg for patient that unfortunately no apts available today.  Pls schedule next available with any provider when patient returns call.

## 2020-04-09 NOTE — Telephone Encounter (Signed)
Controlled substance, needs appt prior to refills.

## 2020-04-10 NOTE — Telephone Encounter (Signed)
Pt scheduled telemed. R/s her for sooner appt 10/6

## 2020-04-11 ENCOUNTER — Telehealth (INDEPENDENT_AMBULATORY_CARE_PROVIDER_SITE_OTHER): Payer: Medicare HMO | Admitting: Physician Assistant

## 2020-04-11 DIAGNOSIS — G8929 Other chronic pain: Secondary | ICD-10-CM

## 2020-04-11 DIAGNOSIS — M25561 Pain in right knee: Secondary | ICD-10-CM

## 2020-04-11 MED ORDER — HYDROCODONE-ACETAMINOPHEN 5-325 MG OR TABS
1.0000 | ORAL_TABLET | Freq: Four times a day (QID) | ORAL | 0 refills | Status: DC | PRN
Start: 2020-04-11 — End: 2020-08-31

## 2020-04-11 NOTE — Patient Instructions (Signed)
(  M25.561) Acute pain of right knee  Plan:     Acute on chronic right knee pain; reports re-injury with ED evaluation 9/30 and negative XR. She endorses 8/10 pain with inability to walk. Primary concern is pain control.    Review of records shows history of chronic pain. She has been previously referred to orthopedics but failed to schedule.     Will provide hydrocodone/acetaminophen 5/325#10. Discussed max dose tylenol 4g daily. Discussed should not be taking oral NSAID with GI history.     Schedule in clinic evaluation; she is aware that hydrocodone is not a long term management option.     (M25.561,  G89.29) Chronic pain of right knee  Plan: see above.
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
## 2020-04-20 ENCOUNTER — Telehealth (INDEPENDENT_AMBULATORY_CARE_PROVIDER_SITE_OTHER): Payer: Medicare HMO | Admitting: Family

## 2020-04-25 ENCOUNTER — Other Ambulatory Visit: Payer: Self-pay

## 2020-05-11 ENCOUNTER — Emergency Department: Payer: Self-pay

## 2020-05-25 ENCOUNTER — Emergency Department: Payer: Self-pay

## 2020-05-28 ENCOUNTER — Encounter (INDEPENDENT_AMBULATORY_CARE_PROVIDER_SITE_OTHER): Payer: Self-pay

## 2020-06-08 ENCOUNTER — Ambulatory Visit (INDEPENDENT_AMBULATORY_CARE_PROVIDER_SITE_OTHER): Payer: Self-pay

## 2020-06-08 ENCOUNTER — Encounter (INDEPENDENT_AMBULATORY_CARE_PROVIDER_SITE_OTHER): Payer: Self-pay

## 2020-06-09 ENCOUNTER — Ambulatory Visit (INDEPENDENT_AMBULATORY_CARE_PROVIDER_SITE_OTHER): Payer: Self-pay

## 2020-06-09 ENCOUNTER — Emergency Department: Payer: Self-pay

## 2020-06-09 NOTE — Telephone Encounter (Signed)
Call Type:  Triage Call    Caller Name:  Kaileen Bronkema    Facility Name:  [facility]    Presenting Problem:  2nd attempt missed nurse cb, coughing just coughed up mucous, doesn't feel good at all not sure what to do    Assessment:  01. Symptoms: What are your symptoms? no contact  02. Onset: When did your symptoms/concern begin? (minutes, hours, days, or months ago?) na  03. Action: What actions or treatments have you taken to treat your symptoms/concern? na  04. Response: What was the response to your actions taken? na  05. Severity: How severe are your symptoms? (ex. interference with normal activities, pain scale, size or appearance of injury) na    Triage Note:  No contact left message on a voicemail    Guideline Title:  No Contact or Duplicate Contact Call (Adult)    Guideline Question:  [1] Message left on identified voice mail? Yes    Recommended Disposition:  No contact call    Original Inclination:  No Contact    Intended Action:  No contact call    Care Advice:  No Contact or Duplicate Contact Call (Adult) guideline.  Note to [facility] - No Contact Calls:  * Document time of attempts.  * Verify phone number.

## 2020-06-09 NOTE — Telephone Encounter (Signed)
Call Type:  Triage Call    Caller Name:  Shanna Un    Facility Name:  [facility]    Presenting Problem:  coughing up mucus, feeling ill for two days. having flu like symptoms.    Assessment:  01. Symptoms: What are your symptoms? Message left on voicemail. 1 CB per charge.  02. Onset: When did your symptoms/concern begin? (minutes, hours, days, or months ago?) UTA  03. Action: What actions or treatments have you taken to treat your symptoms/concern? UTA  04. Response: What was the response to your actions taken? UTA  05. Severity: How severe are your symptoms? (ex. interference with normal activities, pain scale, size or appearance of injury) UTA    Guideline Title:  No Contact or Duplicate Contact Call (Adult)    Guideline Question:  [1] Message left on unidentified voice mail.  Phone number verified.? Yes    Recommended Disposition:  No contact call    Original Inclination:  NA-Urgent Call    Intended Action:  No contact call    Care Advice:  No Contact or Duplicate Contact Call (Adult) guideline.

## 2020-06-10 ENCOUNTER — Other Ambulatory Visit (HOSPITAL_BASED_OUTPATIENT_CLINIC_OR_DEPARTMENT_OTHER): Payer: Self-pay

## 2020-06-10 ENCOUNTER — Ambulatory Visit (INDEPENDENT_AMBULATORY_CARE_PROVIDER_SITE_OTHER): Payer: Self-pay

## 2020-06-10 ENCOUNTER — Emergency Department (HOSPITAL_COMMUNITY): Payer: Medicare HMO

## 2020-06-10 ENCOUNTER — Emergency Department
Admission: EM | Admit: 2020-06-10 | Discharge: 2020-06-10 | Disposition: A | Discharge status: Home / Self Care | Payer: Medicare HMO | Attending: Emergency Medicine | Admitting: Emergency Medicine

## 2020-06-10 ENCOUNTER — Emergency Department (EMERGENCY_DEPARTMENT_HOSPITAL): Payer: Medicare HMO

## 2020-06-10 ENCOUNTER — Other Ambulatory Visit: Payer: Self-pay

## 2020-06-10 DIAGNOSIS — Z9884 Bariatric surgery status: Secondary | ICD-10-CM | POA: Insufficient documentation

## 2020-06-10 DIAGNOSIS — Z8709 Personal history of other diseases of the respiratory system: Secondary | ICD-10-CM | POA: Insufficient documentation

## 2020-06-10 DIAGNOSIS — Z5900 Homelessness unspecified: Secondary | ICD-10-CM | POA: Insufficient documentation

## 2020-06-10 DIAGNOSIS — Z20822 Contact with and (suspected) exposure to covid-19: Secondary | ICD-10-CM | POA: Insufficient documentation

## 2020-06-10 DIAGNOSIS — J069 Acute upper respiratory infection, unspecified: Secondary | ICD-10-CM | POA: Insufficient documentation

## 2020-06-10 DIAGNOSIS — Z79899 Other long term (current) drug therapy: Secondary | ICD-10-CM | POA: Insufficient documentation

## 2020-06-10 DIAGNOSIS — B309 Viral conjunctivitis, unspecified: Secondary | ICD-10-CM | POA: Insufficient documentation

## 2020-06-10 DIAGNOSIS — Z888 Allergy status to other drugs, medicaments and biological substances status: Secondary | ICD-10-CM | POA: Insufficient documentation

## 2020-06-10 DIAGNOSIS — Z886 Allergy status to analgesic agent status: Secondary | ICD-10-CM | POA: Insufficient documentation

## 2020-06-10 DIAGNOSIS — Z792 Long term (current) use of antibiotics: Secondary | ICD-10-CM | POA: Insufficient documentation

## 2020-06-10 DIAGNOSIS — Z880 Allergy status to penicillin: Secondary | ICD-10-CM | POA: Insufficient documentation

## 2020-06-10 DIAGNOSIS — R059 Cough, unspecified: Secondary | ICD-10-CM

## 2020-06-10 LAB — RAPID FLU A & B,  RSV, SARS-COV-2 PCR
COVID-19 Coronavirus Qual PCR Result: NOT DETECTED
Rapid Influenza A PCR Result: NOT DETECTED
Rapid Influenza B PCR Result: NOT DETECTED
Rapid RSV PCR Result: NOT DETECTED

## 2020-06-10 MED ORDER — ERYTHROMYCIN 5 MG/GM OP OINT
TOPICAL_OINTMENT | Freq: Every evening | OPHTHALMIC | 0 refills | Status: DC
Start: 2020-06-10 — End: 2020-08-31
  Filled 2020-06-10: qty 3.5, 14d supply, fill #0

## 2020-06-10 MED ORDER — ALBUTEROL SULFATE HFA 108 (90 BASE) MCG/ACT IN AERS
2.0000 | INHALATION_SPRAY | Freq: Four times a day (QID) | RESPIRATORY_TRACT | 0 refills | Status: DC | PRN
Start: 2020-06-10 — End: 2020-08-31
  Filled 2020-06-10: qty 18, 25d supply, fill #0

## 2020-06-10 NOTE — ED Triage Notes (Signed)
Pt was seen yesterday at [facility] for B eye pain. Also reports cough, ST. Pt was not tested for covid.

## 2020-06-10 NOTE — Telephone Encounter (Signed)
Call Type:  Triage Call    Caller Name:  Alaynna Kerwood    Facility Name:  [facility]    Presenting Problem:  Pink eye in both eyes swollen and hurting...if caller doesn't answer on first call try back a second time.    Assessment:  01. Symptoms: What are your symptoms? Bilat eye pain, swelling, redness  02. Onset: When did your symptoms/concern begin? (minutes, hours, days, or months ago?) 1 day ago  03. Action: What actions or treatments have you taken to treat your symptoms/concern? Heat compress Cefdinir last dose 5 hours ago  04. Response: What was the response to your actions taken? No relief  05. Severity: How severe are your symptoms? (ex. interference with normal activities, pain scale, size or appearance of injury) Bilat eye pain 9/10, swelling, redness, yellow eye drainage.  06. Additional/Further RN Assessment: (ex. weight, I/O, etc.) Seen in ER 1 day ago, dx: double pink eye    Triage Note:  Agrees be seen in ER.  HOSPITAL NAME: [place].    Guideline Title:  Eye Pain (Adult)    Guideline Question:  [1] SEVERE eye pain? Yes    Recommended Disposition:  GO to ED Now    Original Inclination:  Called your Doctor (Dentist, Pharmacist)    Intended Action:  GO to ED Now    Care Advice:  Care Advice given per Eye Pain (Adult) guideline.  Go to ED Now:  * You need to be seen in the Emergency Department.  Another Adult Should Drive:  * It is better and safer if another adult drives instead of you.  Bring Medicines:  * Please bring a list of your current medicines when you go to the Emergency Department (ER).

## 2020-06-10 NOTE — Telephone Encounter (Signed)
Call Type:  Triage Call    Caller Name:  Delma Drone    Facility Name:  [id number]    Presenting Problem:  Coughing & mucus. Does not feel good at all.Should caller be seen in office?    Assessment:  01. Symptoms: What are your symptoms? sore throat , cough ear pain runny nose  02. Onset: When did your symptoms/concern begin? (minutes, hours, days, or months ago?) x 2 1/2 days  03. Action: What actions or treatments have you taken to treat your symptoms/concern? alka seltzer cold plus every 4-6 hours humidifier, using albuterol MDI 2 puffs every 6 hour more oftentheam usual  04. Response: What was the response to your actions taken? no improvement  05. Severity: How severe are your symptoms? (ex. interference with normal activities, pain scale, size or appearance of injury) cough is getting worse , coughing up yellow brown mucus, sore throat2-3/10, hot ans cold flashes  05. Severity: How severe are your symptoms? (ex. interference with normal activities, pain scale, size or appearance of injury) mild sob, use MDI more often , wheezing off an on more when lying down,  06. Additional/Further RN Assessment: (ex. weight, I/O, etc.) does not feel like asthma/ no known exposure to COVID 19 , PMH-heart arrhythmia, unable to check temp    Guideline Title:  Common Cold (Adult)    Guideline Question:  [1] Cough is main symptom? Yes    Recommended Disposition:  See Physician Within 4 Hours (or PCP triage)    Guideline Title:  Cough - Acute Productive (Adult)    Guideline Question:  [1] MILD difficulty breathing (e.g., minimal/no SOB at rest, SOB with walking, pulse <100) AND [2] still present when not coughing? Yes    Recommended Disposition:  See Physician Within 4 Hours (or PCP triage)    Original Inclination:  Called your Doctor (Dentist, Pharmacist)    Intended Action:  Urgent Care Center    Care Advice Guideline Title:  Common Cold (Adult)    Care Advice:  Care Advice given per Cough - Acute Productive (Adult)  guideline.  See HCP Within 4 Hours (or PCP Triage):  * If Office Will Be Open: You need to be seen within the next 3 or 4 hours. Call your doctor (or NP/PA) now or as soon as the office opens.  Bring Medicines:  * Please bring a list of your current medicines when you go to see the doctor.  * It is also a good idea to bring the pill bottles too. This will help the doctor to make certain you are taking the right medicines and the right dose.  Call Back If:  * You become worse.    Care Advice Guideline Title:  Cough - Acute Productive (Adult)    Care Advice:  Care Advice given per Cough - Acute Productive (Adult) guideline.  See HCP Within 4 Hours (or PCP Triage):  * If Office Will Be Open: You need to be seen within the next 3 or 4 hours. Call your doctor (or NP/PA) now or as soon as the office opens.  Bring Medicines:  * Please bring a list of your current medicines when you go to see the doctor.  * It is also a good idea to bring the pill bottles too. This will help the doctor to make certain you are taking the right medicines and the right dose.  Call Back If:  * You become worse.

## 2020-06-10 NOTE — Discharge Instructions (Addendum)
Your Covid swab was negative, and your chest x-ray with did not show any pneumonia.  You are being given a prescription for an albuterol inhaler.  Unfortunately the social workers are unable to assist you with quarantine and isolation, since you do not have Covid or influenza.  Follow-up with your primary care doctor.  I hope you feel better.

## 2020-06-10 NOTE — ED Notes (Signed)
ED Social Work Assessment    ID / CC / Reason for Referral:  Patient is a 44 YO female seen in ED for c/o eye pain and cough. Patient is referred to EDSW for material resource needs.        Identifying data/reason for referral:  Referral Source: Physician  Referral Reason: Information/Resources    Social Work Summary:  HPI: EDSW meets with the patient in RME. The patient states that she is homeless and asks for assistance with transportation. EDSW discusses with the patient local homeless shelters and provides printed information listing their locations. SW explains the first-come/first-serve policy currently in place at shelters as well as the current dearth in shelter beds citywide. EDSW encourages the patient to go to listed first-come/first-serve shelter sand/or the [facility]. The patient states that she believes that she ,may be able to call her aunt for assistance with shelter tonight. SW meets with the patient again a short time later and patient reports that her aunt has agreed to pay for a room for her at [facility] in [place] tonight. She asks for transportation assistance to this motel. EDSW requests a cab to transport the patient to [address].    Social History:  Living situation prior to admit: Homeless  Support system: Family members       Healthcare Decision Making Information: Patient's daughter is her Chief of Staff in Following Order for Legal Next of Kin Decision Making:  1. Patient as able  2. Mariana Kaufman  Daughter  [phone]  Other Family/Friends/Contact: n/a    Language: English  Interpreter: No     Impression: Patient has material resource needs.    Plan: Patient discharged with plan to take a taxi to her motel.          Lennice Sites, MSW, LICSW   Emergency Services Social Worker  [facility]  [phone]  SW Emergency department services (minutes): 25
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
## 2020-06-11 ENCOUNTER — Emergency Department: Payer: Self-pay

## 2020-06-13 ENCOUNTER — Encounter (INDEPENDENT_AMBULATORY_CARE_PROVIDER_SITE_OTHER): Payer: Self-pay | Admitting: Family Medicine

## 2020-06-15 ENCOUNTER — Other Ambulatory Visit (HOSPITAL_BASED_OUTPATIENT_CLINIC_OR_DEPARTMENT_OTHER): Payer: Self-pay

## 2020-07-04 ENCOUNTER — Encounter (INDEPENDENT_AMBULATORY_CARE_PROVIDER_SITE_OTHER): Payer: Self-pay | Admitting: Medical

## 2020-07-16 ENCOUNTER — Encounter (INDEPENDENT_AMBULATORY_CARE_PROVIDER_SITE_OTHER): Payer: Self-pay

## 2020-08-24 ENCOUNTER — Emergency Department: Payer: Self-pay

## 2020-08-28 ENCOUNTER — Encounter (INDEPENDENT_AMBULATORY_CARE_PROVIDER_SITE_OTHER): Payer: Self-pay | Admitting: Family

## 2020-08-28 NOTE — Telephone Encounter (Signed)
Routing to pcp to adv - no ov openings prior to Friday (pt also doesn't have appt on Friday)

## 2020-08-28 NOTE — Telephone Encounter (Signed)
Routing to [employer]: please assist in scheduling ED follow up

## 2020-08-29 NOTE — Telephone Encounter (Signed)
TM okay?

## 2020-08-29 NOTE — Telephone Encounter (Signed)
Please call patient to clarify what medications she needs refilled.   I may be able to refill short supply until she is able to be seen. Please assist with scheduling appointment - can schedule with any provider if no appointments available with me. Can override session limits/holds.

## 2020-08-29 NOTE — Telephone Encounter (Signed)
1st attempt.    LMTCB    CCR pls ask patient what medication she is requesting and also offer sooner telemed with any provider.  See msg below.

## 2020-08-30 NOTE — Telephone Encounter (Signed)
Spoke to patient, asked her to clarify which medication(s) needing refilled. Patient stated she will wait to discuss with PCP at [facility] on 08/31/20; these are new rxs provided during treatment.    No further action needed.  Routing to PCP as FYI.

## 2020-08-31 ENCOUNTER — Telehealth (INDEPENDENT_AMBULATORY_CARE_PROVIDER_SITE_OTHER): Payer: Medicare HMO | Admitting: Family

## 2020-08-31 DIAGNOSIS — M25512 Pain in left shoulder: Secondary | ICD-10-CM

## 2020-08-31 DIAGNOSIS — F411 Generalized anxiety disorder: Secondary | ICD-10-CM

## 2020-08-31 DIAGNOSIS — M1711 Unilateral primary osteoarthritis, right knee: Secondary | ICD-10-CM

## 2020-08-31 DIAGNOSIS — G8929 Other chronic pain: Secondary | ICD-10-CM

## 2020-08-31 DIAGNOSIS — F1010 Alcohol abuse, uncomplicated: Secondary | ICD-10-CM

## 2020-08-31 DIAGNOSIS — F1910 Other psychoactive substance abuse, uncomplicated: Secondary | ICD-10-CM

## 2020-08-31 DIAGNOSIS — M5440 Lumbago with sciatica, unspecified side: Secondary | ICD-10-CM

## 2020-08-31 DIAGNOSIS — F4310 Post-traumatic stress disorder, unspecified: Secondary | ICD-10-CM

## 2020-08-31 DIAGNOSIS — F333 Major depressive disorder, recurrent, severe with psychotic symptoms: Secondary | ICD-10-CM

## 2020-08-31 MED ORDER — TIZANIDINE HCL 4 MG OR TABS
4.0000 mg | ORAL_TABLET | Freq: Two times a day (BID) | ORAL | 5 refills | Status: DC
Start: 2020-08-31 — End: 2021-02-22

## 2020-08-31 MED ORDER — QUETIAPINE FUMARATE 200 MG OR TABS
200.0000 mg | ORAL_TABLET | Freq: Every evening | ORAL | 5 refills | Status: DC
Start: 2020-08-31 — End: 2020-12-07

## 2020-08-31 MED ORDER — FLUOXETINE HCL 20 MG OR CAPS
20.0000 mg | ORAL_CAPSULE | Freq: Two times a day (BID) | ORAL | 5 refills | Status: DC
Start: 2020-08-31 — End: 2021-02-22

## 2020-08-31 MED ORDER — BUSPIRONE HCL 10 MG OR TABS
10.0000 mg | ORAL_TABLET | Freq: Three times a day (TID) | ORAL | 5 refills | Status: DC
Start: 2020-08-31 — End: 2021-02-22

## 2020-08-31 MED ORDER — LIDOCAINE 5 % EX PTCH
1.0000 | MEDICATED_PATCH | CUTANEOUS | 5 refills | Status: DC
Start: 2020-08-31 — End: 2020-09-17

## 2020-08-31 MED ORDER — QUETIAPINE FUMARATE 25 MG OR TABS
25.0000 mg | ORAL_TABLET | Freq: Two times a day (BID) | ORAL | 5 refills | Status: DC
Start: 2020-08-31 — End: 2022-12-15
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
## 2020-09-03 ENCOUNTER — Telehealth (INDEPENDENT_AMBULATORY_CARE_PROVIDER_SITE_OTHER): Payer: Self-pay | Admitting: Family

## 2020-09-03 NOTE — Telephone Encounter (Signed)
Suboxone treatment options in [place] - sent phone numbers to patient on mychart

## 2020-09-04 ENCOUNTER — Telehealth (INDEPENDENT_AMBULATORY_CARE_PROVIDER_SITE_OTHER): Payer: Self-pay | Admitting: Counselor

## 2020-09-04 NOTE — Telephone Encounter (Addendum)
Received referral to assist patient in locating mental health and SUD treatment. Called patient and left voicemail requesting pt call back to discuss resources. (SW intends to provide resources for [facility] and [employer], as both provide mental health and medication assisted treatment.)     Addendum (09/10/20): Called patient to follow up on SW referral. Per chart review, pt had an appt with PCP [facility] and appears to have a suboxone treatment center. Patient picked up the phone, however states she is unable to talk at the moment, and requests a call back at a later time.     Addendum 09/12/20: Called pt to follow up on SW referral. No answer at this time and left voicemail message requesting a call back if patient would like support around locating mental health/substance use treatment programs.

## 2020-09-06 ENCOUNTER — Telehealth (INDEPENDENT_AMBULATORY_CARE_PROVIDER_SITE_OTHER): Payer: Self-pay | Admitting: Family

## 2020-09-06 NOTE — Telephone Encounter (Signed)
Placed [employer] form in Erica's inbox.

## 2020-09-06 NOTE — Telephone Encounter (Signed)
Pt has been scheduled.  °

## 2020-09-06 NOTE — Telephone Encounter (Signed)
Needs visit to complete form, please schedule. Telemedicine okay.

## 2020-09-07 ENCOUNTER — Telehealth (INDEPENDENT_AMBULATORY_CARE_PROVIDER_SITE_OTHER): Payer: Medicare HMO | Admitting: Family

## 2020-09-07 DIAGNOSIS — N3000 Acute cystitis without hematuria: Secondary | ICD-10-CM

## 2020-09-07 MED ORDER — SULFAMETHOXAZOLE-TRIMETHOPRIM 800-160 MG OR TABS
1.0000 | ORAL_TABLET | Freq: Two times a day (BID) | ORAL | 0 refills | Status: AC
Start: 2020-09-07 — End: 2020-09-12
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
## 2020-09-07 NOTE — Telephone Encounter (Signed)
Faxed [employer] form back to [employer].  Pt had a telemed visit with Alcario Drought today to fill out form.

## 2020-09-17 ENCOUNTER — Encounter (INDEPENDENT_AMBULATORY_CARE_PROVIDER_SITE_OTHER): Payer: Self-pay | Admitting: Family

## 2020-09-17 DIAGNOSIS — G8929 Other chronic pain: Secondary | ICD-10-CM

## 2020-09-17 DIAGNOSIS — M1711 Unilateral primary osteoarthritis, right knee: Secondary | ICD-10-CM

## 2020-09-17 DIAGNOSIS — M25512 Pain in left shoulder: Secondary | ICD-10-CM

## 2020-09-17 MED ORDER — LIDOCAINE 5 % EX PTCH
2.0000 | MEDICATED_PATCH | CUTANEOUS | 5 refills | Status: DC
Start: 2020-09-17 — End: 2021-02-22

## 2020-09-17 NOTE — Telephone Encounter (Signed)
Patient requesting rx increased dosage    Routing to PCP

## 2020-09-18 NOTE — Telephone Encounter (Signed)
Needs visit to discuss need for yeast infection medication. TM or in person okay, can schedule with any provider.

## 2020-09-18 NOTE — Telephone Encounter (Signed)
Patient requesting new rx for  Yeast infection  Routing to PCP

## 2020-09-18 NOTE — Telephone Encounter (Signed)
1st attempt.    LMTCB    CCR pls schedule apt.

## 2020-09-20 NOTE — Telephone Encounter (Signed)
Pt has been scheduled.  °

## 2020-09-21 ENCOUNTER — Encounter (INDEPENDENT_AMBULATORY_CARE_PROVIDER_SITE_OTHER): Payer: Medicare HMO | Admitting: Family

## 2020-09-21 ENCOUNTER — Telehealth (INDEPENDENT_AMBULATORY_CARE_PROVIDER_SITE_OTHER): Payer: Self-pay | Admitting: Family

## 2020-09-21 NOTE — Telephone Encounter (Signed)
RETURN CALL: No Call Back Needed      SUBJECT:  Cancellation/Reschedule Request     REASON: insurance issues  ADDITIONAL INFORMATION: patient called to cancel 3/18 16:00 appointment. CCR unable to cancel as appointment is already marked arrived.

## 2020-09-21 NOTE — Telephone Encounter (Signed)
Noted  

## 2020-09-21 NOTE — Progress Notes (Signed)
This encounter was opened in error      Patient late canceled appointment today.

## 2020-10-05 ENCOUNTER — Encounter (INDEPENDENT_AMBULATORY_CARE_PROVIDER_SITE_OTHER): Payer: Self-pay

## 2020-10-22 ENCOUNTER — Telehealth (INDEPENDENT_AMBULATORY_CARE_PROVIDER_SITE_OTHER): Payer: Medicare HMO | Admitting: Family

## 2020-10-22 DIAGNOSIS — R3915 Urgency of urination: Secondary | ICD-10-CM

## 2020-10-22 DIAGNOSIS — R350 Frequency of micturition: Secondary | ICD-10-CM

## 2020-10-22 DIAGNOSIS — M7989 Other specified soft tissue disorders: Secondary | ICD-10-CM

## 2020-10-22 NOTE — Progress Notes (Signed)
Distant Site Telemedicine Encounter    I conducted this encounter from [facility] Medicine Primary Care via secure, live, face-to-face video conference with the patient. Sharon Stephens was located at Home.  I reviewed the risks and benefits of telemedicine as pertinent to this visit and the patient agreed to proceed.       Chief Complaint   Patient presents with   . Leg Swelling        Subjective:     Sharon Stephens is a 45 year old female who presents on 10/22/2020.    Swollen lower leg to ankle   Redness  Painful   Has been gradually worsening over the last week or so   Both legs   No open wounds or discharge  Right is worse than left   Has had this before     Urinary urgency and frequency     No fever  No chest pain   No shortness of breath            Objective:    Vitals: There were no vitals taken for this visit.  Physical Exam         Assessment and Plan:      1. Leg swelling  Follow up in clinic scheduled. Needs in person evaluation. Will come into clinic tomorrow.

## 2020-10-23 ENCOUNTER — Ambulatory Visit (INDEPENDENT_AMBULATORY_CARE_PROVIDER_SITE_OTHER): Payer: Medicare HMO | Admitting: Family

## 2020-10-23 ENCOUNTER — Encounter (INDEPENDENT_AMBULATORY_CARE_PROVIDER_SITE_OTHER): Payer: Self-pay | Admitting: Family

## 2020-10-23 VITALS — BP 144/87 | HR 78 | Temp 96.5°F | Resp 12 | Wt 392.0 lb

## 2020-10-23 DIAGNOSIS — R609 Edema, unspecified: Secondary | ICD-10-CM

## 2020-10-23 DIAGNOSIS — I872 Venous insufficiency (chronic) (peripheral): Secondary | ICD-10-CM

## 2020-10-23 DIAGNOSIS — I10 Essential (primary) hypertension: Secondary | ICD-10-CM

## 2020-10-23 DIAGNOSIS — M7989 Other specified soft tissue disorders: Secondary | ICD-10-CM

## 2020-10-23 DIAGNOSIS — R300 Dysuria: Secondary | ICD-10-CM

## 2020-10-23 DIAGNOSIS — M79672 Pain in left foot: Secondary | ICD-10-CM

## 2020-10-23 LAB — CBC, DIFF
% Basophils: 0 %
% Eosinophils: 4 %
% Immature Granulocytes: 0 %
% Lymphocytes: 36 %
% Monocytes: 7 %
% Neutrophils: 53 %
% Nucleated RBC: 0 %
Absolute Eosinophil Count: 0.18 10*3/uL (ref 0.00–0.50)
Absolute Lymphocyte Count: 1.78 10*3/uL (ref 1.00–4.80)
Basophils: 0.01 10*3/uL (ref 0.00–0.20)
Hematocrit: 36 % (ref 36.0–45.0)
Hemoglobin: 11.2 g/dL — ABNORMAL LOW (ref 11.5–15.5)
Immature Granulocytes: 0 10*3/uL (ref 0.00–0.05)
MCH: 28.4 pg (ref 27.3–33.6)
MCHC: 30.8 g/dL — ABNORMAL LOW (ref 32.2–36.5)
MCV: 92 fL (ref 81–98)
Monocytes: 0.33 10*3/uL (ref 0.00–0.80)
Neutrophils: 2.68 10*3/uL (ref 1.80–7.00)
Nucleated RBC: 0 10*3/uL
Platelet Count: 210 10*3/uL (ref 150–400)
RBC: 3.94 10*6/uL (ref 3.80–5.00)
RDW-CV: 15.4 % — ABNORMAL HIGH (ref 11.6–14.4)
WBC: 4.98 10*3/uL (ref 4.3–10.0)

## 2020-10-23 LAB — U/A AUTO W/MICRO, ONSITE
Bilirubin, Urine: NEGATIVE
Casts, URN: NEGATIVE /LPF
Crystals, URN: NEGATIVE /LPF
Glucose, Urine: NEGATIVE mg/dL
Ketones, URN: NEGATIVE mg/dL
Leukocytes: NEGATIVE
Nitrite, URN: NEGATIVE
RBC, Urine: NEGATIVE
Specific Gravity, Urine: 1.025 (ref 1.005–1.030)
Urobilinogen, URN: 2 E.U./dL — AB (ref 0.2–1.0)
WBC, URN: NEGATIVE /HPF (ref ?–5)
pH, URN: 5.5 (ref 5.0–8.0)

## 2020-10-23 MED ORDER — FUROSEMIDE 20 MG OR TABS
20.0000 mg | ORAL_TABLET | Freq: Every day | ORAL | 0 refills | Status: DC
Start: 2020-10-23 — End: 2020-12-25

## 2020-10-23 MED ORDER — OTHER MEDICATION (SEE SIG/INSTRUCTIONS)
2 refills | Status: DC
Start: 2020-10-23 — End: 2022-12-15

## 2020-10-23 NOTE — Patient Instructions (Signed)
Get compression stockings. Start wearing these as soon as possible.     Elevated your legs frequently.     Make sure you are drinking plenty of fluids and walking routinely.     Monitor your legs closely - if you have any worsening redness or swelling let me know right away or be seen in an urgent care     Go to ER with any fever, shortness of breath, chest pain, palpitations, dizziness, severe headache     Take furosemide daily for 5 days     Monitor your blood pressure daily     Follow up with me in 2 weeks - sooner if needed

## 2020-10-23 NOTE — Progress Notes (Signed)
Chief Complaint   Patient presents with   . Leg Swelling     Pt c/o R and L leg swelling xd./bf,cma    . Urinary Tract Infection     Pt c/o polyuria and dysuria x d./bf,cma        Subjective:     Sharon Stephens is a 45 year old female who presents on 10/23/2020.    Leg swelling - see visit from TM yesterday     No new or worsening symptoms     No fever     Dysuria - no burning or pain, peeing more frequently, feels like she isnt emptying her bladder completely     Previously on betablocker - metoprolol, d/c while in treatment due to hypotension and dizziness/lightheadedness     left foot, twisted foot about 4-5 months ago, ongoing pain at first and second metatarsal on top of foot and on medial side     Objective:    Vitals: BP (!) 144/87   Pulse 78   Temp (!) 35.8 C (Temporal)   Resp (!) 12   Wt (!) 177.8 kg (392 lb)   SpO2 100%   BMI (P) 63.27 kg/m   Physical Exam  General: well appearing, alert, no acute distress  Heart: normal rhythm and rate  Lungs: clear to auscultation  Lower extremity pitting edema bilaterally, mild erythema and warmth, no open wounds or sores        Assessment and Plan:    {VT  LOS Calculator  Job aid  AMA's MDM grid :999}  1. Venous insufficiency  Edema most consistent with venous insufficiency - recommend compression stockings, elevating feet when sitting, increased fluids, walking.   Monitor your legs closely - if you have any worsening redness or swelling let me know right away or be seen in an urgent care     Go to ER with any fever, shortness of breath, chest pain, palpitations, dizziness, severe headache     Take furosemide daily for 5 days     Monitor your blood pressure daily     Follow up with me in 2 weeks - sooner if needed   - Other Meds (See Sig/Instructions); Medication name: Compression stockings - 20-51mmhg compression. Use daily for bilateral leg edema.  Dispense: 3 each; Refill: 2    2. Leg swelling  See above   - Other Meds (See Sig/Instructions); Medication  name: Compression stockings - 20-42mmhg compression. Use daily for bilateral leg edema.  Dispense: 3 each; Refill: 2  - Comprehensive Metabolic Panel; Future  - CBC with Diff; Future  - B-Type Natriuretic Peptide; Future  - furosemide 20 MG tablet; Take 1 tablet (20 mg) by mouth daily for 5 days.  Dispense: 5 tablet; Refill: 0  - B-Type Natriuretic Peptide  - CBC with Diff  - Comprehensive Metabolic Panel    3. Essential hypertension, benign  Only mildly elevated today, pt to monitor BP at home, follow up in 2 weeks. May need to restart daily antihypertensive medication.   - furosemide 20 MG tablet; Take 1 tablet (20 mg) by mouth daily for 5 days.  Dispense: 5 tablet; Refill: 0    4. Left foot pain  No fracture, recommend wearing supportive shoes - add inserts with good arch supports. Follow up if not improving or with any new/worsening symptoms.   - XR Foot 2 Vw Left    5. Dysuria  Normal UA today. Increase fluids, avoid bladder irritants, monitor for new or worsening symptoms,  follow up as needed.   - POC Urine Dipstick, Automated w/UA SED

## 2020-10-24 LAB — COMPREHENSIVE METABOLIC PANEL
ALT (GPT): 7 U/L (ref 7–33)
AST (GOT): 14 U/L (ref 9–38)
Albumin: 4.1 g/dL (ref 3.5–5.2)
Alkaline Phosphatase (Total): 82 U/L (ref 25–112)
Anion Gap: 7 (ref 4–12)
Bilirubin (Total): 0.6 mg/dL (ref 0.2–1.3)
Calcium: 8.7 mg/dL — ABNORMAL LOW (ref 8.9–10.2)
Carbon Dioxide, Total: 29 meq/L (ref 22–32)
Chloride: 105 meq/L (ref 98–108)
Creatinine: 0.62 mg/dL (ref 0.38–1.02)
Glucose: 94 mg/dL (ref 62–125)
Potassium: 4.4 meq/L (ref 3.6–5.2)
Protein (Total): 6.9 g/dL (ref 6.0–8.2)
Sodium: 141 meq/L (ref 135–145)
Urea Nitrogen: 13 mg/dL (ref 8–21)
eGFR by CKD-EPI: >60 mL/min/{1.73_m2} (ref >59)

## 2020-10-24 LAB — B_TYPE NATRIURETIC PEPTIDE: B_Type Natriuretic Peptide: 154 pg/mL — ABNORMAL HIGH (ref <101)

## 2020-11-09 ENCOUNTER — Telehealth (INDEPENDENT_AMBULATORY_CARE_PROVIDER_SITE_OTHER): Payer: Medicare HMO | Admitting: Family

## 2020-11-19 ENCOUNTER — Encounter (INDEPENDENT_AMBULATORY_CARE_PROVIDER_SITE_OTHER): Payer: Self-pay | Admitting: Family

## 2020-11-19 ENCOUNTER — Telehealth (INDEPENDENT_AMBULATORY_CARE_PROVIDER_SITE_OTHER): Payer: Medicare HMO | Admitting: Family

## 2020-11-19 DIAGNOSIS — R058 Other specified cough: Secondary | ICD-10-CM

## 2020-11-19 DIAGNOSIS — J449 Chronic obstructive pulmonary disease, unspecified: Secondary | ICD-10-CM

## 2020-11-19 DIAGNOSIS — H9201 Otalgia, right ear: Secondary | ICD-10-CM

## 2020-11-19 MED ORDER — CEFPODOXIME PROXETIL 200 MG OR TABS
200.0000 mg | ORAL_TABLET | Freq: Two times a day (BID) | ORAL | 0 refills | Status: AC
Start: 2020-11-19 — End: 2020-11-26

## 2020-11-19 MED ORDER — ALBUTEROL SULFATE HFA 108 (90 BASE) MCG/ACT IN AERS
INHALATION_SPRAY | RESPIRATORY_TRACT | 1 refills | Status: DC
Start: 2020-11-19 — End: 2020-12-13
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
## 2020-12-05 ENCOUNTER — Ambulatory Visit (INDEPENDENT_AMBULATORY_CARE_PROVIDER_SITE_OTHER): Payer: Self-pay

## 2020-12-05 ENCOUNTER — Telehealth (INDEPENDENT_AMBULATORY_CARE_PROVIDER_SITE_OTHER): Payer: Self-pay | Admitting: Family

## 2020-12-05 NOTE — Telephone Encounter (Signed)
Call Type:  Triage Call    Caller Name:  Dorie Ohms    Facility Name:  [id number]    Presenting Problem:  Breathing Difficulty / Respiratory Rate - having a hard time sleep, thinks she has fluid in lungs, phlegm, wheezing really bad, taking a covid test right now    Assessment:  01. Symptoms: What are your symptoms? history of asthma-wheezing-productive cough with brownish green sputum production  02. Onset: When did your symptoms/concern begin? (minutes, hours, days, or months ago?) 2 days ago-Did test negative for covid prior to calling-treated for URI 2 weeks ago-denied any known covid exposure  03. Action: What actions or treatments have you taken to treat your symptoms/concern? Albuteral inhaler q 6 hours for the past week  04. Response: What was the response to your actions taken? not helping  05. Severity: How severe are your symptoms? (ex. interference with normal activities, pain scale, size or appearance of injury) caller and roommate can hear autible wheezing on occassion-mild SOB with exertion only. No autible wheezing heard over the phone.  06. Additional/Further RN Assessment: (ex. weight, I/O, etc.) Pulse ox 98% at present and pulse 73    Guideline Title:  Asthma Attack (Adult)    Guideline Question:  [1] MILD asthma attack (e.g., no SOB at rest, mild SOB with walking, speaks normally in sentences, mild wheezing) AND [2] lasting > 24 hours on prescribed treatment? Yes    Recommended Disposition:  See PCP or Video Visit Within 24 Hours    Original Inclination:  NA-Urgent Call    Intended Action:  See PCP or Video Visit Within 24 Hours    Care Advice:  Care Advice given per Asthma Attack (Adult) guideline.  * For mild asthma symptoms use your quick-relief inhaler (2 puffs each time) or your nebulizer every 4 hours. Continue the quick-relief asthma medicine until you have not wheezed or coughed for 48 hours. It takes a minimum of 7 days of medicine for lung function to return to normal.  Call  Back If:  * Moderate asthma attack (audible wheezes, breathing difficulty) and not better after 2 or 3 inhaler or nebulizer treatments given 20 minutes apart  * Quick-relief asthma medicine (such as albuterol by inhaler or nebulizer) is needed more often than every 4 hours  * You become worse  See PCP Within 24 Hours:  * If Office Will Be Open: You need to be examined within the next 24 hours. Call your doctor (or NP/PA) when the office opens and make an appointment.  * If Office Will Be Closed: You need to be seen within the next 24 hours. A clinic or an urgent care center is often a good source of care if your doctor's office is closed or you can't get an appointment.  Drink Plenty of Liquids and Use a Humidifier:  * Drink plenty of liquids. It is important to stay well-hydrated.  * A healthy adult should drink 8 cups (240 ml) or more of liquid each day.  * If the air is dry, use a humidifier to prevent drying of the upper airway.

## 2020-12-05 NOTE — Telephone Encounter (Signed)
RETURN CALL: Voicemail - Detailed Message      SUBJECT:  Appointment Request     REASON FOR VISIT: Cough, wheezing, wants chest x-ray.    PREFERRED DATE/TIME: 06/02, 12pm or after.     ADDITIONAL INFORMATION: CCR unable to schedule per DT. Patient wants to be seen in person. CCR attempted to transfer to the clinic, but they were assisting other patients. Thank you!

## 2020-12-05 NOTE — Telephone Encounter (Addendum)
Spoke with patient, cough, wheezing, wants chest x-ray, tested negative for Covid two days ago, warm transferred to [facility] to PCP as [employer]

## 2020-12-07 ENCOUNTER — Telehealth (INDEPENDENT_AMBULATORY_CARE_PROVIDER_SITE_OTHER): Payer: Self-pay | Admitting: Family

## 2020-12-07 ENCOUNTER — Telehealth (INDEPENDENT_AMBULATORY_CARE_PROVIDER_SITE_OTHER): Payer: Medicare HMO | Admitting: Family

## 2020-12-07 DIAGNOSIS — F333 Major depressive disorder, recurrent, severe with psychotic symptoms: Secondary | ICD-10-CM

## 2020-12-07 DIAGNOSIS — R0602 Shortness of breath: Secondary | ICD-10-CM

## 2020-12-07 DIAGNOSIS — R059 Cough, unspecified: Secondary | ICD-10-CM

## 2020-12-07 DIAGNOSIS — J4521 Mild intermittent asthma with (acute) exacerbation: Secondary | ICD-10-CM

## 2020-12-07 MED ORDER — QUETIAPINE FUMARATE 50 MG OR TABS
50.0000 mg | ORAL_TABLET | Freq: Every evening | ORAL | 11 refills | Status: DC
Start: 2020-12-07 — End: 2022-12-15

## 2020-12-07 MED ORDER — PREDNISONE 20 MG OR TABS
40.0000 mg | ORAL_TABLET | Freq: Every day | ORAL | 0 refills | Status: AC
Start: 2020-12-07 — End: 2020-12-12

## 2020-12-07 NOTE — Progress Notes (Signed)
Distant Site Telemedicine Encounter    I conducted this encounter from My Home via secure, live, face-to-face video conference with the patient. Karris was located at Home.  I reviewed the risks and benefits of telemedicine as pertinent to this visit and the patient agreed to proceed.       Chief Complaint   Patient presents with   . URI        Subjective:     Sharon Stephens is a 45 year old female who presents on 12/07/2020.    Had a URI which started on may 13   Was seen on may 16   Tuesday night and Wednesday - worsened cough and wheezing   Treated with cefpodoxime - did get better   But then earlier this week her symptoms worsened  Feels it is allergies   More coughing in the morning and at night   Wheezing at night   Some wheezing and shortness of breath during the day   No fever  Took COVID test on Wednesday which was negative   Using albuterol which does help some   Doesn't feel sick   Doesn't take allergy medication       seroquel - takes 25 mg during the day and 100 mg at night   Feels she is too sleepy   Wants to decrease night dose  Mood is stable        Objective:    Vitals: There were no vitals taken for this visit.  Physical Exam  General: well appearing, alert, no acute distress         Assessment and Plan:      1. Mild intermittent asthma with exacerbation  Suspect asthma flare from recent URI. Treat with prednisone. F/u in clinic if not improving in 2-3 days, sooner with new or worsening symptoms.   - predniSONE 20 MG tablet; Take 2 tablets (40 mg) by mouth daily for 5 days. Take with food.  Dispense: 10 tablet; Refill: 0    2. Severe episode of recurrent major depressive disorder, with psychotic features (HCC)  Okay to try decreasing seroquel, monitor for mood changes.   - QUEtiapine 50 MG tablet; Take 1 tablet (50 mg) by mouth at bedtime.  Dispense: 30 tablet; Refill: 11

## 2020-12-07 NOTE — Telephone Encounter (Addendum)
1st attempt: Ldvmtcb for pharmacist to confirm whether orders sent today received.    Spoke with pt, notified her rxs sent & attempt was made to reach pharmacy.    CCR: please confirm if rxs received & processed.  -  prednisone 20 MG tablet (resent via RightFax)  -  QUEtiapine 50 MG (unable to pull up on RightFax)

## 2020-12-07 NOTE — Telephone Encounter (Signed)
RETURN CALL: Voicemail - Detailed Message      SUBJECT:  General Message     MESSAGE: Patient stated that her pharmacy has not received her order for her medications following her telemedicine visit on 12/07/20. Patient stated she had called her pharmacy [id number] [phone] [id number] already.

## 2020-12-10 ENCOUNTER — Other Ambulatory Visit (INDEPENDENT_AMBULATORY_CARE_PROVIDER_SITE_OTHER): Payer: Self-pay | Admitting: Family

## 2020-12-10 DIAGNOSIS — J449 Chronic obstructive pulmonary disease, unspecified: Secondary | ICD-10-CM

## 2020-12-11 ENCOUNTER — Encounter (INDEPENDENT_AMBULATORY_CARE_PROVIDER_SITE_OTHER): Payer: Medicare HMO | Admitting: Family

## 2020-12-11 DIAGNOSIS — Z124 Encounter for screening for malignant neoplasm of cervix: Secondary | ICD-10-CM

## 2020-12-11 DIAGNOSIS — Z23 Encounter for immunization: Secondary | ICD-10-CM

## 2020-12-11 DIAGNOSIS — Z1159 Encounter for screening for other viral diseases: Secondary | ICD-10-CM

## 2020-12-13 MED ORDER — ALBUTEROL SULFATE HFA 108 (90 BASE) MCG/ACT IN AERS
INHALATION_SPRAY | RESPIRATORY_TRACT | 3 refills | Status: DC
Start: 2020-12-13 — End: 2020-12-14

## 2020-12-14 ENCOUNTER — Other Ambulatory Visit (INDEPENDENT_AMBULATORY_CARE_PROVIDER_SITE_OTHER): Payer: Self-pay | Admitting: Family

## 2020-12-14 DIAGNOSIS — J449 Chronic obstructive pulmonary disease, unspecified: Secondary | ICD-10-CM

## 2020-12-16 MED ORDER — ALBUTEROL SULFATE HFA 108 (90 BASE) MCG/ACT IN AERS
2.0000 | INHALATION_SPRAY | Freq: Four times a day (QID) | RESPIRATORY_TRACT | 3 refills | Status: DC | PRN
Start: 2020-12-16 — End: 2022-01-13

## 2020-12-24 ENCOUNTER — Encounter (INDEPENDENT_AMBULATORY_CARE_PROVIDER_SITE_OTHER): Payer: Self-pay | Admitting: Family

## 2020-12-24 ENCOUNTER — Other Ambulatory Visit (INDEPENDENT_AMBULATORY_CARE_PROVIDER_SITE_OTHER): Payer: Self-pay | Admitting: Family

## 2020-12-24 DIAGNOSIS — M7989 Other specified soft tissue disorders: Secondary | ICD-10-CM

## 2020-12-24 DIAGNOSIS — I10 Essential (primary) hypertension: Secondary | ICD-10-CM

## 2020-12-25 ENCOUNTER — Telehealth (INDEPENDENT_AMBULATORY_CARE_PROVIDER_SITE_OTHER): Payer: Self-pay | Admitting: Family

## 2020-12-25 ENCOUNTER — Ambulatory Visit (INDEPENDENT_AMBULATORY_CARE_PROVIDER_SITE_OTHER): Payer: Self-pay | Admitting: Family

## 2020-12-25 MED ORDER — FUROSEMIDE 20 MG OR TABS
20.0000 mg | ORAL_TABLET | Freq: Every day | ORAL | 0 refills | Status: AC
Start: 2020-12-25 — End: 2020-12-28

## 2020-12-25 NOTE — Telephone Encounter (Signed)
Copied from my chart encounter dated 6/20 at 10:03AM  Can you refill my lastix my leg start swollen over the weekend.     "Both my ankles and my feet began to swell over the weekend" "I think it's because I have been falling asleep in my chair with my feet down"       Reason for Disposition  . MILD swelling of both ankles (i.e., pedal edema) AND new onset or worsening    Additional Information  . Negative: Sounds like a life-threatening emergency to the triager  . Negative: Chest pain  . Negative: Small area of swelling and followed an insect bite to the area  . Negative: Followed a knee injury  . Negative: Ankle or foot injury  . Negative: Pregnant with leg swelling or edema  . Negative: Difficulty breathing at rest  . Negative: Entire foot is cool or blue in comparison to other side  . Negative: SEVERE swelling (e.g., swelling extends above knee, entire leg is swollen, weeping fluid)  . Negative: Thigh or calf pain and only 1 side and present > 1 hour  . Negative: Thigh, calf, or ankle swelling in only one leg  . Negative: Thigh, calf, or ankle swelling in both legs, but one side is definitely more swollen  . Negative: Cast on leg or ankle and has increasing pain  . Negative: Can't walk or can barely stand (new onset)  . Negative: Fever and red area (or area very tender to touch)  . Negative: Patient sounds very sick or weak to the triager  . Negative: Swelling of face, arm or hands (Exception: slight puffiness of fingers during hot weather)  . Negative: Pregnant > 20 weeks and sudden weight gain (i.e., > 2 lbs, 1 kg in one week)  . Negative: MODERATE swelling of both ankles (e.g., swelling extends up to the knees) AND new onset or worsening  . Negative: Difficulty breathing with exertion AND worsening or new onset  . Negative: Looks like a boil, infected sore, deep ulcer, or other infected rash (spreading redness, pus)  . Negative: Patient wants to be seen    Answer Assessment - Initial Assessment Questions  1.  ONSET: "When did the swelling start?" (e.g., minutes, hours, days)      Over the weekend  2. LOCATION: "What part of the leg is swollen?"  "Are both legs swollen or just one leg?"      Feet and ankles in both legs  3. SEVERITY: "How bad is the swelling?" (e.g., localized; mild, moderate, severe)   - Localized - small area of swelling localized to one leg   - MILD pedal edema - swelling limited to foot and ankle, pitting edema < 1/4 inch (6 mm) deep, rest and elevation eliminate most or all swelling   - MODERATE edema - swelling of lower leg to knee, pitting edema > 1/4 inch (6 mm) deep, rest and elevation only partially reduce swelling   - SEVERE edema - swelling extends above knee, facial or hand swelling present       Mild to moderate   4. REDNESS: "Does the swelling look red or infected?"      A little red  5. PAIN: "Is the swelling painful to touch?" If so, ask: "How painful is it?"   (Scale 1-10; mild, moderate or severe)      Mild   6. FEVER: "Do you have a fever?" If so, ask: "What is it, how was it measured, and when did  it start?"       No   7. CAUSE: "What do you think is causing the leg swelling?"      Leaving my feet down instead of elevating them  8. MEDICAL HISTORY: "Do you have a history of heart failure, kidney disease, liver failure, or cancer?"      No   9. RECURRENT SYMPTOM: "Have you had leg swelling before?" If so, ask: "When was the last time?" "What happened that time?"      Recently in April and I was treated with Lasix  10. OTHER SYMPTOMS: "Do you have any other symptoms?" (e.g., chest pain, difficulty breathing)        No   11. PREGNANCY: "Is there any chance you are pregnant?" "When was your last menstrual period?"        N/a    Protocols used: LEG SWELLING AND EDEMA-ADULT - [place]

## 2020-12-25 NOTE — Telephone Encounter (Signed)
See separate pt message encounter

## 2020-12-25 NOTE — Telephone Encounter (Signed)
Reviewed RN triage note. Sent 3 days of furosemide for patient. If not improving or if she develops new/worsenign symptoms she needs to be seen in clinic or at [facility]. Sent message to pt to notify her.

## 2020-12-25 NOTE — Telephone Encounter (Signed)
Telephone triage encounter created to assess current symptoms

## 2020-12-25 NOTE — Telephone Encounter (Signed)
Patient requesting refill  Rx: furosemide 20 MG tablet(Expired)  Pharmacy pended  Routing to PCP

## 2020-12-25 NOTE — Telephone Encounter (Signed)
Routing symptom based message to nurse triage:    Leg swelling    Routing to PCP - patient also requesting furosemide on refill TE

## 2020-12-25 NOTE — Telephone Encounter (Signed)
"Both my ankles and my feet began to swell over the weekend" "I think it's because I have been falling asleep in my chair with my feet down"     Declined Disposition to be seen within 3 days at this time  Requesting a refill of lasix    Reason for Disposition  . MILD swelling of both ankles (i.e., pedal edema) AND new onset or worsening    Additional Information  . Negative: Sounds like a life-threatening emergency to the triager  . Negative: Chest pain  . Negative: Small area of swelling and followed an insect bite to the area  . Negative: Followed a knee injury  . Negative: Ankle or foot injury  . Negative: Pregnant with leg swelling or edema  . Negative: Difficulty breathing at rest  . Negative: Entire foot is cool or blue in comparison to other side  . Negative: SEVERE swelling (e.g., swelling extends above knee, entire leg is swollen, weeping fluid)  . Negative: Thigh or calf pain and only 1 side and present > 1 hour  . Negative: Thigh, calf, or ankle swelling in only one leg  . Negative: Thigh, calf, or ankle swelling in both legs, but one side is definitely more swollen  . Negative: Cast on leg or ankle and has increasing pain  . Negative: Can't walk or can barely stand (new onset)  . Negative: Fever and red area (or area very tender to touch)  . Negative: Patient sounds very sick or weak to the triager  . Negative: Swelling of face, arm or hands (Exception: slight puffiness of fingers during hot weather)  . Negative: Pregnant > 20 weeks and sudden weight gain (i.e., > 2 lbs, 1 kg in one week)  . Negative: MODERATE swelling of both ankles (e.g., swelling extends up to the knees) AND new onset or worsening  . Negative: Difficulty breathing with exertion AND worsening or new onset  . Negative: Looks like a boil, infected sore, deep ulcer, or other infected rash (spreading redness, pus)  . Negative: Patient wants to be seen    Answer Assessment - Initial Assessment Questions  1. ONSET: "When did the swelling  start?" (e.g., minutes, hours, days)      Over the weekend  2. LOCATION: "What part of the leg is swollen?"  "Are both legs swollen or just one leg?"      Feet and ankles in both legs  3. SEVERITY: "How bad is the swelling?" (e.g., localized; mild, moderate, severe)   - Localized - small area of swelling localized to one leg   - MILD pedal edema - swelling limited to foot and ankle, pitting edema < 1/4 inch (6 mm) deep, rest and elevation eliminate most or all swelling   - MODERATE edema - swelling of lower leg to knee, pitting edema > 1/4 inch (6 mm) deep, rest and elevation only partially reduce swelling   - SEVERE edema - swelling extends above knee, facial or hand swelling present       Mild to moderate   4. REDNESS: "Does the swelling look red or infected?"      A little red  5. PAIN: "Is the swelling painful to touch?" If so, ask: "How painful is it?"   (Scale 1-10; mild, moderate or severe)      Mild   6. FEVER: "Do you have a fever?" If so, ask: "What is it, how was it measured, and when did it start?"  No   7. CAUSE: "What do you think is causing the leg swelling?"      Leaving my feet down instead of elevating them  8. MEDICAL HISTORY: "Do you have a history of heart failure, kidney disease, liver failure, or cancer?"      No   9. RECURRENT SYMPTOM: "Have you had leg swelling before?" If so, ask: "When was the last time?" "What happened that time?"      Recently in April and I was treated with Lasix  10. OTHER SYMPTOMS: "Do you have any other symptoms?" (e.g., chest pain, difficulty breathing)        No   11. PREGNANCY: "Is there any chance you are pregnant?" "When was your last menstrual period?"        N/a    Protocols used: LEG SWELLING AND EDEMA-ADULT - [place]

## 2021-01-16 ENCOUNTER — Emergency Department: Payer: Self-pay

## 2021-02-08 ENCOUNTER — Other Ambulatory Visit: Payer: Self-pay

## 2021-02-22 ENCOUNTER — Other Ambulatory Visit (INDEPENDENT_AMBULATORY_CARE_PROVIDER_SITE_OTHER): Payer: Self-pay | Admitting: Family

## 2021-02-22 DIAGNOSIS — G8929 Other chronic pain: Secondary | ICD-10-CM

## 2021-02-22 DIAGNOSIS — F333 Major depressive disorder, recurrent, severe with psychotic symptoms: Secondary | ICD-10-CM

## 2021-02-22 DIAGNOSIS — M1711 Unilateral primary osteoarthritis, right knee: Secondary | ICD-10-CM

## 2021-02-22 DIAGNOSIS — F411 Generalized anxiety disorder: Secondary | ICD-10-CM

## 2021-02-22 DIAGNOSIS — F4310 Post-traumatic stress disorder, unspecified: Secondary | ICD-10-CM

## 2021-02-22 DIAGNOSIS — M25512 Pain in left shoulder: Secondary | ICD-10-CM

## 2021-02-24 ENCOUNTER — Other Ambulatory Visit (INDEPENDENT_AMBULATORY_CARE_PROVIDER_SITE_OTHER): Payer: Self-pay | Admitting: Family

## 2021-02-24 DIAGNOSIS — F333 Major depressive disorder, recurrent, severe with psychotic symptoms: Secondary | ICD-10-CM

## 2021-02-24 DIAGNOSIS — F411 Generalized anxiety disorder: Secondary | ICD-10-CM

## 2021-02-24 DIAGNOSIS — F4310 Post-traumatic stress disorder, unspecified: Secondary | ICD-10-CM

## 2021-02-25 MED ORDER — BUSPIRONE HCL 10 MG OR TABS
10.0000 mg | ORAL_TABLET | Freq: Three times a day (TID) | ORAL | 5 refills | Status: AC
Start: 2021-02-25 — End: 2022-12-15

## 2021-02-25 MED ORDER — LIDOCAINE 5 % EX PTCH
2.0000 | MEDICATED_PATCH | CUTANEOUS | 5 refills | Status: DC
Start: 2021-02-25 — End: 2021-09-16

## 2021-02-25 MED ORDER — FLUOXETINE HCL 20 MG OR CAPS
20.0000 mg | ORAL_CAPSULE | Freq: Two times a day (BID) | ORAL | 5 refills | Status: DC
Start: 2021-02-25 — End: 2021-02-28

## 2021-02-25 NOTE — Telephone Encounter (Signed)
This request falls outside refill center's protocol:    Tizanidine-for pain. defer to provider    If this medication is denied,please have your staff inform the patient and next visit on 03/01/21.

## 2021-02-26 ENCOUNTER — Other Ambulatory Visit (INDEPENDENT_AMBULATORY_CARE_PROVIDER_SITE_OTHER): Payer: Self-pay | Admitting: Family

## 2021-02-26 DIAGNOSIS — F333 Major depressive disorder, recurrent, severe with psychotic symptoms: Secondary | ICD-10-CM

## 2021-02-26 DIAGNOSIS — G8929 Other chronic pain: Secondary | ICD-10-CM

## 2021-02-26 DIAGNOSIS — F411 Generalized anxiety disorder: Secondary | ICD-10-CM

## 2021-02-26 DIAGNOSIS — F4310 Post-traumatic stress disorder, unspecified: Secondary | ICD-10-CM

## 2021-02-26 NOTE — Telephone Encounter (Signed)
Buspirone was already sent to pharmacy. Removing duplicate request from the refill inbox, patient to check with pharmacy on readiness.

## 2021-02-27 MED ORDER — TIZANIDINE HCL 4 MG OR TABS
4.0000 mg | ORAL_TABLET | Freq: Two times a day (BID) | ORAL | 5 refills | Status: DC
Start: 2021-02-27 — End: 2021-09-13

## 2021-02-27 NOTE — Telephone Encounter (Signed)
Still open on our end. Please respond.

## 2021-02-28 MED ORDER — FLUOXETINE HCL 20 MG OR CAPS
ORAL_CAPSULE | ORAL | 4 refills | Status: DC
Start: 2021-02-28 — End: 2021-09-12

## 2021-02-28 NOTE — Telephone Encounter (Signed)
Next visit 03/08/21

## 2021-03-01 ENCOUNTER — Telehealth (INDEPENDENT_AMBULATORY_CARE_PROVIDER_SITE_OTHER): Payer: Medicare HMO | Admitting: Family

## 2021-03-08 ENCOUNTER — Telehealth (INDEPENDENT_AMBULATORY_CARE_PROVIDER_SITE_OTHER): Payer: Medicare HMO | Admitting: Family

## 2021-03-15 ENCOUNTER — Telehealth (INDEPENDENT_AMBULATORY_CARE_PROVIDER_SITE_OTHER): Payer: Medicare HMO | Admitting: Family

## 2021-05-08 ENCOUNTER — Other Ambulatory Visit: Payer: Self-pay

## 2021-06-05 ENCOUNTER — Encounter (INDEPENDENT_AMBULATORY_CARE_PROVIDER_SITE_OTHER): Payer: Self-pay

## 2021-08-16 ENCOUNTER — Telehealth (INDEPENDENT_AMBULATORY_CARE_PROVIDER_SITE_OTHER): Payer: Self-pay | Admitting: Family

## 2021-08-16 NOTE — Telephone Encounter (Signed)
RETURN CALL: No Call Back Needed      SUBJECT:  General Message     MESSAGE: Anuju from [place] is calling to confer with Dr. Juliane Lack about a mutual patient.    Anuju states that she saw patient today in her home and they reviewed behavioral health questions.  She states that she will be the case manager for patient going forward for behavioral health services and will be seeing her regularly.    She just wanted to let Dr. Juliane Lack know.  This is just an FYI, [facility].

## 2021-08-19 NOTE — Telephone Encounter (Signed)
Patient is overdue for follow up with me. Not urgent but should have follow up visit in the next month. Please help patient schedule in person visit.

## 2021-08-19 NOTE — Telephone Encounter (Signed)
1st attempt  LMTCB    2nd attempt  Mychart scheduling request sent

## 2021-09-11 ENCOUNTER — Other Ambulatory Visit (INDEPENDENT_AMBULATORY_CARE_PROVIDER_SITE_OTHER): Payer: Self-pay | Admitting: Family

## 2021-09-11 DIAGNOSIS — F411 Generalized anxiety disorder: Secondary | ICD-10-CM

## 2021-09-11 DIAGNOSIS — F4310 Post-traumatic stress disorder, unspecified: Secondary | ICD-10-CM

## 2021-09-11 DIAGNOSIS — G8929 Other chronic pain: Secondary | ICD-10-CM

## 2021-09-11 DIAGNOSIS — F333 Major depressive disorder, recurrent, severe with psychotic symptoms: Secondary | ICD-10-CM

## 2021-09-12 MED ORDER — FLUOXETINE HCL 20 MG OR CAPS
ORAL_CAPSULE | ORAL | 4 refills | Status: AC
Start: 2021-09-12 — End: ?

## 2021-09-12 NOTE — Telephone Encounter (Signed)
Refill request for TIZANIDINE requires your authorization because it was discontinued off medication list on 08/26/21 but patient is now requesting a refill.    Reason for discontinuation: not listed    Last filled 08/18/21    If medication is DISCONTINUED please "refuse all" medication requests and have your staff contact the patient.  If medication is to be continued please add refills, sign order and close encounter.

## 2021-09-13 ENCOUNTER — Other Ambulatory Visit (INDEPENDENT_AMBULATORY_CARE_PROVIDER_SITE_OTHER): Payer: Self-pay | Admitting: Family

## 2021-09-13 DIAGNOSIS — M25512 Pain in left shoulder: Secondary | ICD-10-CM

## 2021-09-13 DIAGNOSIS — M1711 Unilateral primary osteoarthritis, right knee: Secondary | ICD-10-CM

## 2021-09-13 DIAGNOSIS — G8929 Other chronic pain: Secondary | ICD-10-CM

## 2021-09-13 MED ORDER — TIZANIDINE HCL 4 MG OR TABS
ORAL_TABLET | ORAL | 5 refills | Status: DC
Start: 2021-09-13 — End: 2022-12-15

## 2021-09-13 NOTE — Telephone Encounter (Signed)
Patient has moved and is seeing a new provider.    Fyi, PCP.

## 2021-09-16 MED ORDER — LIDOCAINE 5 % EX PTCH
MEDICATED_PATCH | CUTANEOUS | 0 refills | Status: DC
Start: 2021-09-16 — End: 2021-10-11

## 2021-09-16 NOTE — Telephone Encounter (Signed)
Needs to reschedule cancelled appt  Patient is due for f/u exam.  One refill authorized.  Please schedule follow up visit.

## 2021-09-18 NOTE — Telephone Encounter (Signed)
1st attempt.    LMTCB    CCR pls schedule fu apt.      2nd attempt.  mychart msg sent.

## 2021-10-10 ENCOUNTER — Other Ambulatory Visit (INDEPENDENT_AMBULATORY_CARE_PROVIDER_SITE_OTHER): Payer: Self-pay | Admitting: Family

## 2021-10-10 DIAGNOSIS — M25512 Pain in left shoulder: Secondary | ICD-10-CM

## 2021-10-10 DIAGNOSIS — G8929 Other chronic pain: Secondary | ICD-10-CM

## 2021-10-10 DIAGNOSIS — M1711 Unilateral primary osteoarthritis, right knee: Secondary | ICD-10-CM

## 2021-10-11 MED ORDER — LIDOCAINE 5 % EX PTCH
MEDICATED_PATCH | CUTANEOUS | 0 refills | Status: DC
Start: 2021-10-11 — End: 2021-11-24

## 2021-10-11 NOTE — Telephone Encounter (Signed)
1st attempt- Called and LDVM for patient requesting a call back in order to assist in getting patient scheduled for yearly wellness.     CCR please assist in scheduling patient for yearly wellness visit.

## 2021-10-11 NOTE — Telephone Encounter (Signed)
Due for wellness visit.  One refill authorized.  Please reschedule missed appointment

## 2021-11-23 ENCOUNTER — Other Ambulatory Visit (INDEPENDENT_AMBULATORY_CARE_PROVIDER_SITE_OTHER): Payer: Self-pay | Admitting: Family

## 2021-11-23 DIAGNOSIS — M1711 Unilateral primary osteoarthritis, right knee: Secondary | ICD-10-CM

## 2021-11-23 DIAGNOSIS — G8929 Other chronic pain: Secondary | ICD-10-CM

## 2021-11-24 MED ORDER — LIDOCAINE 5 % EX PTCH
MEDICATED_PATCH | CUTANEOUS | 0 refills | Status: AC
Start: 2021-11-24 — End: ?

## 2021-11-24 NOTE — Telephone Encounter (Signed)
Patient is due for yearly exam.  One refill authorized.  Please schedule follow up visit.

## 2021-11-26 NOTE — Telephone Encounter (Signed)
Patient has changed pcp closer to home.

## 2021-12-19 ENCOUNTER — Emergency Department: Payer: Self-pay

## 2021-12-19 ENCOUNTER — Other Ambulatory Visit (INDEPENDENT_AMBULATORY_CARE_PROVIDER_SITE_OTHER): Payer: Self-pay | Admitting: Family

## 2021-12-19 DIAGNOSIS — G8929 Other chronic pain: Secondary | ICD-10-CM

## 2021-12-19 DIAGNOSIS — M1711 Unilateral primary osteoarthritis, right knee: Secondary | ICD-10-CM

## 2021-12-19 DIAGNOSIS — M25512 Pain in left shoulder: Secondary | ICD-10-CM

## 2022-01-10 ENCOUNTER — Other Ambulatory Visit (INDEPENDENT_AMBULATORY_CARE_PROVIDER_SITE_OTHER): Payer: Self-pay | Admitting: Family

## 2022-01-10 DIAGNOSIS — F4310 Post-traumatic stress disorder, unspecified: Secondary | ICD-10-CM

## 2022-01-10 DIAGNOSIS — F411 Generalized anxiety disorder: Secondary | ICD-10-CM

## 2022-01-10 DIAGNOSIS — F333 Major depressive disorder, recurrent, severe with psychotic symptoms: Secondary | ICD-10-CM

## 2022-01-13 ENCOUNTER — Telehealth (INDEPENDENT_AMBULATORY_CARE_PROVIDER_SITE_OTHER): Payer: Self-pay

## 2022-01-13 DIAGNOSIS — J449 Chronic obstructive pulmonary disease, unspecified: Secondary | ICD-10-CM

## 2022-01-13 MED ORDER — ALBUTEROL SULFATE HFA 108 (90 BASE) MCG/ACT IN AERS
2.0000 | INHALATION_SPRAY | Freq: Four times a day (QID) | RESPIRATORY_TRACT | 0 refills | Status: AC | PRN
Start: 2022-01-13 — End: ?

## 2022-01-13 NOTE — Telephone Encounter (Signed)
Incoming fax from [facility]  Need Albuterol Inhaler refill. Pharmacy and medication pended. LS: 12/07/2020  Routing to PCP.

## 2022-01-13 NOTE — Telephone Encounter (Signed)
Refilled x 1 with note to schedule appointment as has not been seen for >1 year

## 2022-02-06 ENCOUNTER — Other Ambulatory Visit (INDEPENDENT_AMBULATORY_CARE_PROVIDER_SITE_OTHER): Payer: Self-pay | Admitting: Registered Nurse

## 2022-02-06 DIAGNOSIS — G8929 Other chronic pain: Secondary | ICD-10-CM

## 2022-02-06 NOTE — Telephone Encounter (Signed)
This medication is outside of the Refill Center's protocols. Please sign and close the encounter if you approve:    Tizanidine 4mg tablet    If this medication is denied please have your staff inform the patient and schedule an appointment if necessary.

## 2022-02-06 NOTE — Telephone Encounter (Signed)
Covering Provider: Defer muscle relaxer refill consideration to prior prescriber when she refers.

## 2022-02-10 NOTE — Telephone Encounter (Signed)
Patient requires appointment. Please assist in scheduling.

## 2022-02-10 NOTE — Telephone Encounter (Signed)
Sent pt Mychart message

## 2022-02-25 NOTE — Telephone Encounter (Signed)
2nd attempt:  LVMTCB    CCR/FD Pool:  Please assist with scheduling f/u appointment.  Thank you!

## 2022-04-09 ENCOUNTER — Emergency Department: Payer: Self-pay

## 2022-09-10 ENCOUNTER — Emergency Department: Payer: Self-pay

## 2022-09-25 ENCOUNTER — Emergency Department: Payer: Self-pay
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
## 2022-11-12 ENCOUNTER — Emergency Department: Payer: Self-pay

## 2022-11-22 LAB — OTHER LAB ORDER
Hemoglobin A1C: 5.6 % (ref 4.0–6.0)
Mean Blood Glucose Estimate: 114 mg/dL (ref 65–125)

## 2022-11-24 ENCOUNTER — Emergency Department: Payer: Self-pay

## 2022-11-24 NOTE — ED Provider Notes (Signed)
[facility]         Visit Date: 11/24/2022                           Arrived by: Car   Accompanied by: FAMILY MEMBER   Primary Care Provider: Vic Ripper, ND   History obtained from: History obtained from the patient.    ________________________________________________________________          TREATMENT SUMMARY AND MEDICAL DECISION MAKING        MDM  Patient with bilateral lower extremity edema.  Is gone on for quite some time.  She had labs done by her new primary care provider who recommended come to ER for sound to look for blood clot.  She was seen on May 8 with a CTA looking for PE which was negative.  She was also told her iron was low.  Acute recheck CBC your hemoglobin is 8.9 looks stable.  Vascular ultrasound is negative for DVT.  I discussed with the patient likelihood of venous insufficiency or possibly lymphedema and we discussed follow-up with her primary care provider and return precautions and she stable for discharge.  Discussed return precautions.    Ddx includes DVT, venous insufficiency, lymphedema, cellulitis.    I reviewed the following prior notes, labs, or studies:  Documents from last hospital admission reviewed: Previous admit date: 04/11/2017    Documents from last surgery reviewed: No surgery found No surgery found     DIAGNOSIS  Final diagnoses:   Leg swelling         DISPOSITION  discharge      ED Prescriptions       None             Follow-up Information       Vic Ripper, ND.    Specialty: Naturopath  Why: Return to the Emergency room if worse or problems  Contact information:  [address]  [place] [address]  [phone]                               ED RECORD      CHIEF COMPLAINT Leg Swelling (- bilateral legs )      HISTORY OF PRESENT ILLNESS   Sharon Stephens is 47 y.o. female with h/o alcohol use who presents to the ED with BLE swelling. The pt reports bilateral foot and swelling for "a while". She further reports she had blood work done on  11/22/22 and was found to have an elevated D-Dimer. The pt explains she received a call from her PCP instructing her to come to the ED for further work up. She endorses a h/o iron deficiency anemia.    The pt denies any other complaints.    REVIEW OF SYSTEMS  All other review of systems reviewed and negative.    PAST MEDICAL HISTORY  She has a past medical history of A-fib (HCC), Alcohol dependence (HCC), Anxiety, Arthritis, Back pain, Cannabis dependence in remission (HCC), Depression, Depression, GERD (gastroesophageal reflux disease), High blood pressure, History of eating disorder, History of ETOH abuse, Migraines, Morbid obesity (HCC), Neck pain (2007), Obesities, morbid (HCC), Sleep apnea, Substance abuse (HCC), Suicide attempt (HCC), and Thyroid disease.    PAST SURGICAL HISTORY   Past Surgical History:   Procedure Laterality Date   . APPENDECTOMY     . CESAREAN SECTION, CLASSIC  x 2   . CHOLECYSTECTOMY  2001   . GASTRIC BYPASS SURGERY  Dec 2011    wt prior to surgery 547#   . OTHER SURGICAL HISTORY      C-SECTION   . OTHER SURGICAL HISTORY      TONSILLECTOMY   . PR APPENDECTOMY     . TONSILLECTOMY     . TUBAL LIGATION  dec 2006        CURRENT MEDICATIONS   Current Outpatient Medications on File Prior to Encounter   Medication Sig   . acetaminophen (TYLENOL) 650 MG CR tablet Take 650 mg by mouth EVERY 4 TO 6 HOURS AS NEEDED.   Marland Kitchen albuterol 90 mcg/puff inhaler Inhale 1-2 puffs into the lungs every 6 hours as needed.   . buprenorphine-naloxone (SUBOXONE) 8-2 mg SL film Place 3 Film under the tongue every 24 hours.   . busPIRone (BUSPAR) 10 MG tablet 30 mg.   . cefpodoxime (VANTIN) 200 mg tablet    . doxycycline hyclate (VIBRAMYCIN) 100 mg capsule Take 1 capsule by mouth 2 times daily.   Marland Kitchen FLUoxetine (PROZAC) 40 MG capsule Take 40 mg by mouth Daily.   . hydrOXYzine hydrochloride (ATARAX) 25 mg tablet Take 2 tablets by mouth every 6 hours as needed for Itching.   Marland Kitchen ibuprofen (ADVIL,MOTRIN) 600 MG tablet Take  600 mg by mouth every 8 hours as needed for Pain. (Patient not taking: Reported on 02/26/2021.)   . IUD'S IU by Intrauterine route.   . lidocaine (LIDODERM) 5% patch    . meloxicam (MOBIC) 7.5 mg tablet Take 7.5 mg by mouth Daily. (Patient not taking: Reported on 02/26/2021.)   . QUEtiapine (SEROQUEL) 100 mg tablet Take 100 mg by mouth nightly. (Patient not taking: Reported on 02/26/2021.)       ALLERGIES  Amoxicillin, Gabapentin, Ibuprofen, Melon [cucumis], and Penicillins    SOCIAL HISTORY  She  reports that she has quit smoking. She has never used smokeless tobacco. She reports that she does not currently use alcohol. She reports that she does not currently use drugs after having used the following drugs: Cocaine and Methamphetamines.    FAMILY HISTORY  Family History   Problem Relation Age of Onset   . Heart attack Mother    . Diabetes Mother    . Heart disease Mother    . Diabetes, IDDM Mother    . Depression Father    . Substance abuse Father    . Alcohol abuse Father    . Substance abuse Maternal Aunt    . Substance abuse Maternal Uncle    . Depression Paternal Aunt    . Substance abuse Paternal Aunt    . Substance abuse Paternal Uncle    . Arthritis Maternal Grandmother    . Asthma Maternal Grandmother    . Cancer Maternal Grandmother         Ovarian cancer   . Diabetes Maternal Grandmother    . Heart disease Maternal Grandmother    . High blood pressure Maternal Grandmother    . Substance abuse Maternal Grandmother    . Arthritis Maternal Grandfather    . Asthma Maternal Grandfather    . Cancer Maternal Grandfather         Colon and Lung   . Heart disease Maternal Grandfather    . High blood pressure Maternal Grandfather    . Diabetes Maternal Grandfather    . Substance abuse Maternal Grandfather    . Arthritis Paternal Grandmother    .  Asthma Paternal Grandmother    . Cancer Paternal Grandmother    . COPD Paternal Grandmother    . Depression Paternal Grandmother    . Heart disease Paternal Grandmother    .  High blood pressure Paternal Grandmother    . Diabetes Paternal Grandmother    . Substance abuse Paternal Grandmother    . Arthritis Paternal Grandfather    . Asthma Paternal Grandfather    . Cancer Paternal Grandfather    . Depression Paternal Grandfather    . Heart disease Paternal Grandfather    . High blood pressure Paternal Grandfather    . Diabetes Paternal Grandfather    . Substance abuse Paternal Grandfather        VITAL SIGNS  Temp: 36.6 C (97.9 F) Pulse: 89 Resp: 18 BP: 133/80 SpO2: 97 %   The initial pulse oximetry was interpreted by me (the emergency provider) as being normal for this patient.    PHYSICAL EXAM  CONSTITUTIONAL: Well-appearing; well-nourished; in no apparent distress.   HEAD: Normocephalic; atraumatic.   EYES: PERRL; EOM intact.   ENT: no rhinorrhea; normal pharynx with no tonsillar hypertrophy.   NECK: Supple; no cervical lymphadenopathy.  CHEST: Normal chest excursion with respiration.   CARDIOVASCULAR: Normal S1, S2; no murmurs, rubs, or gallops.   RESPIRATORY: Normal chest excursion with respiration; breath sounds clear and equal bilaterally; no wheezes, rhonchi, or rales.  GI/GU: non-distended; non-tender; no palpable organomegaly.   BACK: No evidence of trauma or deformity. No CVA tenderness.   EXT: BLE edema although legs are very large. Moves all extremities   SKIN: No rash.   NEURO: A & O x 4; grossly unremarkable.   PSYCHOLOGICAL: The patients mood and manner are appropriate. Grooming  and personal hygiene are appropriate.      NURSING NOTES  Nursing notes reviewed and confirmed      EMERGENCY DEPARTMENT COURSE      DATA GATHERING  The patient was seen and examined.  EMS notes were reviewed if available.  Prior medical records were reviewed.    MEDICATIONS ADMINISTERED DURING VISIT  Medications - No data to display    DATABASE  The following studies were interpreted by me contemporaneously in the emergency department.  Recent Results (from the past 24 hour(s))   CBC with  Differential   Result Value Ref Range    White Blood Cells 5.67 3.8 - 11.0 K/uL    Red Blood Cells 3.91 3.70 - 5.10 M/uL    Hemoglobin 8.9 (L) 11.3 - 15.5 g/dL    Hematocrit 09.8 (L) 34.0 - 46.0 %    MCV 78.0 (L) 80.0 - 100.0 fL    MCH 22.8 (L) 27.0 - 34.0 pg    MCHC 29.2 (L) 32.0 - 35.5 g/dL    RDW-CV 11.9 (H) 14.7 - 15.5 %    RDW-SD 44.0 fL    Platelet Count 202 150 - 400 K/uL    MPV 10.2 9.4 - 12.3 fL    Absolute Neutrophils, Automated 3.43 K/uL    % Neutrophils 60.4 %    % Lymphocytes 31.4 %    % Monocytes 4.8 %    % Eosinophils 2.5 %    % Basophils 0.5 %    % Immature Granulocytes 0.4 %    Absolute Neutrophils 3.43 2.00 - 7.30 K/uL    Absolute Lymphocytes 1.78 1.00 - 3.40 K/uL    Absolute Monocytes 0.27 0.00 - 0.80 K/uL    Absolute Eosinophils 0.14 0.00 - 0.50 K/uL  Absolute Basophils 0.03 0.00 - 0.10 K/uL    Absolute Immature Granulocytes 0.02 0.00 - 0.03 K/uL    % nRBC 0.0 per 100 WBCs         IMAGING (interpreted by radiology, reviewed by me)  VAS Lower Extremity Venous Bilateral    Result Date: 11/25/2022  Images from the original result were not included. Technically difficult exam due to body habitus. Right Lower Extremity: No evidence of deep or superficial venous thrombosis Limited visualization in the calf: unable to rule out isolated calf vein thrombosis Left Lower Extremity: No evidence of deep or superficial venous thrombosis Limited visualization in the calf: unable to rule out isolated calf vein thrombosis THIS IS A PRELIMINARY IMPRESSION Verbal report given to Erven Colla, [facility] If you have questions, please call [facility] at [phone].      PROVIDER AND SCRIBE ATTESTATION   Please see top of note for assessment, diagnosis and disposition.      I, Erven Colla, personally performed the services described in this documentation. All medical record entries made by the scribe were at my direction and in my presence. I have reviewed the chart and discharge instructions (if applicable) and  agree that the record reflects my personal performance and is accurate and complete.    Erven Colla, PA-C     11/25/2022  4:06 AM PDT        By signing my name below, I, Holley Raring (scribe), attest that this documentation has been prepared under the direction and in the presence of Erven Colla, [place].  Electronically Signed: Holley Raring, Scribe. 11/25/2022. 4:06 AM PDT.    This document was generated in part using voice recognition software, occasional wrong-word or "sound-alike" substitutions may have occurred due to the inherent limitations of voice recognition software. Read the chart carefully and recognize, using context, where these substitutions have occurred. In addition entries are made by physician typing and scribe typing.  Although every effort is made to edit the content, transcription and typing errors may occur.     Ginnie Smart, PA-C  11/25/22 [phone]

## 2022-12-15 ENCOUNTER — Encounter (INDEPENDENT_AMBULATORY_CARE_PROVIDER_SITE_OTHER): Payer: Self-pay | Admitting: Internal Medicine

## 2022-12-15 ENCOUNTER — Ambulatory Visit (INDEPENDENT_AMBULATORY_CARE_PROVIDER_SITE_OTHER): Payer: Medicare HMO | Admitting: Internal Medicine

## 2022-12-15 VITALS — BP 140/87 | HR 73 | Temp 99.3°F | Wt >= 6400 oz

## 2022-12-15 DIAGNOSIS — F1520 Other stimulant dependence, uncomplicated: Secondary | ICD-10-CM

## 2022-12-15 DIAGNOSIS — I10 Essential (primary) hypertension: Secondary | ICD-10-CM

## 2022-12-15 DIAGNOSIS — F1020 Alcohol dependence, uncomplicated: Secondary | ICD-10-CM

## 2022-12-15 DIAGNOSIS — F1521 Other stimulant dependence, in remission: Secondary | ICD-10-CM

## 2022-12-15 DIAGNOSIS — E039 Hypothyroidism, unspecified: Secondary | ICD-10-CM

## 2022-12-15 DIAGNOSIS — J4489 Other specified chronic obstructive pulmonary disease: Secondary | ICD-10-CM

## 2022-12-15 DIAGNOSIS — E519 Thiamine deficiency, unspecified: Secondary | ICD-10-CM

## 2022-12-15 DIAGNOSIS — D509 Iron deficiency anemia, unspecified: Secondary | ICD-10-CM

## 2022-12-15 DIAGNOSIS — E559 Vitamin D deficiency, unspecified: Secondary | ICD-10-CM

## 2022-12-15 DIAGNOSIS — F1021 Alcohol dependence, in remission: Secondary | ICD-10-CM

## 2022-12-15 DIAGNOSIS — I471 Supraventricular tachycardia, unspecified: Secondary | ICD-10-CM

## 2022-12-15 DIAGNOSIS — F333 Major depressive disorder, recurrent, severe with psychotic symptoms: Secondary | ICD-10-CM

## 2022-12-15 MED ORDER — IRON 325 (65 FE) MG OR TABS
325.0000 mg | ORAL_TABLET | Freq: Every day | ORAL | 1 refills | Status: DC
Start: 2022-12-15 — End: 2022-12-15

## 2022-12-15 MED ORDER — VITAMIN D3 1.25 MG (50000 UT) OR CAPS
50000.0000 [IU] | ORAL_CAPSULE | ORAL | 0 refills | Status: DC
Start: 2022-12-15 — End: 2022-12-15

## 2022-12-15 MED ORDER — CYANOCOBALAMIN 500 MCG OR TABS
500.0000 ug | ORAL_TABLET | Freq: Every day | ORAL | 2 refills | Status: AC
Start: 2022-12-15 — End: 2023-03-15

## 2022-12-15 MED ORDER — THIAMINE HCL 100 MG OR TABS
100.0000 mg | ORAL_TABLET | Freq: Every day | ORAL | 2 refills | Status: AC
Start: 2022-12-15 — End: 2023-03-15

## 2022-12-15 MED ORDER — CYANOCOBALAMIN 500 MCG OR TABS
500.0000 ug | ORAL_TABLET | Freq: Every day | ORAL | 2 refills | Status: DC
Start: 2022-12-15 — End: 2022-12-15

## 2022-12-15 MED ORDER — LEVOTHYROXINE SODIUM 125 MCG OR TABS
125.0000 ug | ORAL_TABLET | Freq: Every day | ORAL | 3 refills | Status: AC
Start: 2022-12-15 — End: ?

## 2022-12-15 MED ORDER — THIAMINE HCL 100 MG OR TABS
100.0000 mg | ORAL_TABLET | Freq: Every day | ORAL | 2 refills | Status: DC
Start: 2022-12-15 — End: 2022-12-15

## 2022-12-15 MED ORDER — LEVOTHYROXINE SODIUM 125 MCG OR TABS
125.0000 ug | ORAL_TABLET | Freq: Every day | ORAL | 3 refills | Status: DC
Start: 2022-12-15 — End: 2022-12-15

## 2022-12-15 MED ORDER — IRON 325 (65 FE) MG OR TABS
325.0000 mg | ORAL_TABLET | Freq: Every day | ORAL | 1 refills | Status: AC
Start: 2022-12-15 — End: 2023-02-13

## 2022-12-15 MED ORDER — VITAMIN D3 1.25 MG (50000 UT) OR CAPS
50000.0000 [IU] | ORAL_CAPSULE | ORAL | 0 refills | Status: AC
Start: 2022-12-15 — End: 2023-02-13

## 2022-12-15 NOTE — Patient Instructions (Signed)
-   take the new thyroid medication ( levothyroxine 125 mcg once daily)   - take iron tablet with food or orange juice  - take vitamin D 3 50,000 Iu once every week   - wear your compression stockings.
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
## 2022-12-22 ENCOUNTER — Other Ambulatory Visit: Payer: Self-pay

## 2022-12-26 ENCOUNTER — Telehealth (INDEPENDENT_AMBULATORY_CARE_PROVIDER_SITE_OTHER): Payer: Self-pay | Admitting: Internal Medicine

## 2022-12-26 NOTE — Telephone Encounter (Signed)
ROI Received on: 12/15/2022    Request Sent to: Dr. Vic Ripper    Records needed: All records    Request was sent via: Fax; faxed to [phone]    Contact number for follow up: [phone]

## 2023-01-09 ENCOUNTER — Ambulatory Visit: Payer: Medicare HMO | Admitting: Internal Medicine

## 2023-01-09 NOTE — Progress Notes (Signed)
Patient scheduled for fibroscan on 01/09/2023. Fibroscan technically challenging, BMI > 60, and unable to obtain valid results. If warranted, can consider MR elastography for liver fibrosis assessment.

## 2023-01-09 NOTE — Telephone Encounter (Signed)
2nd attempt to request medical records faxed over to the fax number listed below.

## 2023-01-13 NOTE — Telephone Encounter (Signed)
Records received on 01/13/23 from Dr. Vic Ripper . Records are being held in the medical records drawer.    Dr. Aris Georgia - Please let PSS know, if you would like to review these records.    Patient has an appointment on: 01/26/2023    Data entered/Scanned records:  SCANNED: Pap: NO           Mammo: NO                      Colonoscopy: NO           Diabetic Eye Exam: NO  DATA ENTERED (by lab):            INR: NO                      LDL: NO                      A1C: NO  IMMUNIZATIONS: No Immunizations in records    Records to be shredded: 07/2023

## 2023-01-26 ENCOUNTER — Encounter (INDEPENDENT_AMBULATORY_CARE_PROVIDER_SITE_OTHER): Payer: Medicare HMO | Admitting: Internal Medicine

## 2023-02-17 ENCOUNTER — Encounter (INDEPENDENT_AMBULATORY_CARE_PROVIDER_SITE_OTHER): Payer: Medicare HMO | Admitting: Internal Medicine

## 2023-02-27 ENCOUNTER — Telehealth (INDEPENDENT_AMBULATORY_CARE_PROVIDER_SITE_OTHER): Payer: Self-pay | Admitting: Internal Medicine

## 2023-02-27 NOTE — Telephone Encounter (Signed)
Outreach: Phone Call to Patient  Care gaps: Annual Wellness Visit    Attempted to reach patient by phone. Left voicemail message, explained the reason for the call. I encouraged the patient to contact the clinic or the [facility] Medicine Contact Center to schedule an appointment     Set MyChart + ticket    Closing encounter.

## 2023-03-10 ENCOUNTER — Encounter (INDEPENDENT_AMBULATORY_CARE_PROVIDER_SITE_OTHER): Payer: Medicare HMO | Admitting: Internal Medicine

## 2023-03-19 ENCOUNTER — Telehealth (INDEPENDENT_AMBULATORY_CARE_PROVIDER_SITE_OTHER): Payer: Self-pay | Admitting: Internal Medicine

## 2023-03-19 NOTE — Telephone Encounter (Signed)
2nd attempt    Outreach: Phone Call to Patient  Care gaps: Annual Wellness Visit    Attempted to reach patient by phone. Spoke with pt. Explained reason for call. Discussed patient seeing PCP to address care gaps due. Patient asked to postpone making an appointment. Next outreach by date 03/26/23.    Pt is recovering from surgery, rq callback in 1 wk. Will f/u 03/26/23    CCR / PSS - Please schedule the patient for the appropriate visit type for the Care Gap(s) listed above.    Closing encounter.

## 2023-04-02 NOTE — Progress Notes (Deleted)
[facility]    DATE OF SERVICE: 04/06/2023    REFERRING PROVIDER: Vic Ripper, ND (Primary Care)    IDENTIFICATION/CHIEF CONCERN  Sharon Stephens is a 47 year-old woman referred for evaluation of suspected asthma.    PFTs  FeNO  Labs w/ BNP  Repeat TTE  Likely referral to Cards/PH    HISTORY OF PRESENT ILLNESS  QVAR BID    ED for edema in May x2    Has seen Cards before - Renelda Mom, MD ([facility]), last 02/26/2021      REVIEW OF SYSTEMS  A complete review of systems was performed and negative except as discussed in the history of present illness.    PAST MEDICAL HISTORY  1. Suspected Asthma  2. Obesity s/p Gastric Bypass Surgery (2011)  3. Hypothyroidism  4. Lower Extremity Edema  5. Polysubstance Use Disorder: methamphetamine, cocaine, and alcohol.  6. Anemia  7. Atrial Fibrillation  8. Anxiety / Depression  9. Gastroesophageal Reflux Disease  10. Obstructive Sleep Apnea    MJ use?    On suboxone?    MEDICATIONS    Current Outpatient Medications:     albuterol HFA 108 (90 Base) MCG/ACT inhaler, Inhale 2 puffs by mouth every 6 hours as needed for shortness of breath/wheezing. Due for follow-up,please schedule appointment., Disp: 25.5 g, Rfl: 0    buprenorphine-naloxone 8-2 MG SL tablet, Place 2 tablets under the tongue daily. Prescribed by provider in treatment center, needs new outside suboxone prescriber for ongoing rx., Disp: , Rfl:     busPIRone 10 MG tablet, Take 1 tablet (10 mg) by mouth 3 times a day. (Patient taking differently: Take 1 tablet (10 mg) by mouth 2 times a day.), Disp: 90 tablet, Rfl: 5    Cholecalciferol (Vitamin D3) 1.25 MG (50000 UT) capsule, Take 1 capsule (50,000 units) by mouth one time a week., Disp: 6 capsule, Rfl: 0    cyanocobalamin 500 MCG tablet, Take 1 tablet (500 mcg) by mouth daily., Disp: 30 tablet, Rfl: 2    Ferrous Sulfate (iron) 325 (65 Fe) MG tablet, Take 1 tablet (325 mg) by mouth daily., Disp: 30 tablet, Rfl: 1    FLUoxetine 20 MG capsule,  TAKE 1 CAPSULE(20 MG) BY MOUTH TWICE DAILY, Disp: 60 capsule, Rfl: 4    furosemide 20 MG tablet, Take 1 tablet (20 mg) by mouth daily for 3 days., Disp: 3 tablet, Rfl: 0    hydroCHLOROthiazide 25 MG tablet, Take 1 tablet (25 mg) by mouth 2 times a day., Disp: , Rfl:     Levonorgestrel 20 MCG/24HR Intrauterine IUD, Insert 1 intrauterine device into the uterus one time., Disp: , Rfl:     levothyroxine 125 MCG tablet, Take 1 tablet (125 mcg) by mouth daily on an empty stomach., Disp: 90 tablet, Rfl: 3    lidocaine 5 % patch, APPLY 2 PATCHES TO MOST PAINFUL AREA. APPLY 1 TIME FOR UP TO 12 HOURS IN 24 HOUR TIME PERIOD. **Please schedule yearly wellness appointment-3rd request**, Disp: 60 patch, Rfl: 0    mupirocin 2 % ointment, APPLY TOPICALLY TO THE AFFECTED AREA THREE TIMES DAILY, Disp: , Rfl:     ALLERGIES  Amoxicillin (unclear)  Gabapentin (GI upset)    FAMILY HISTORY   Asthma Paternal Grandmother      Cancer Paternal Grandmother      COPD Paternal Grandmother        Asthma Paternal Grandfather      Asthma Maternal Grandfather  Asthma Maternal Grandmother     SOCIAL HISTORY  She reports that she has quit smoking. She has never used smokeless tobacco. She reports that she does not currently use alcohol. She reports that she does not currently use drugs after having used the following drugs: Cocaine and Methamphetamines.     Mother had MI in her 72's and type I diabetes. She is an only child     Lives in _ with _. Works as _.  -Tobacco:  -Alcohol:  -Other Substances:    EXPOSURE HISTORY  Occupational:  Environmental:  Recreational:  Home:    PHYSICAL EXAMINATION  VS: .vs  GEN: well appearing, in no acute distress  HEENT: sclera anicteric, conjunctiva without injection  NECK: no goiter  CV: regular rate and rhythm, normal S1 and S2 without murmurs, gallops, or rubs  PULM: breathing comfortably on air, clear to auscultation bilaterally  ABD: soft, non-tender, non-distended, no hepatosplenomegaly, active bowel  tones  EXT: warm and well perfused  SKIN: non-jaundiced  NEURO: awake and alert, speech fluent, normal gait  PSYCH: normal mood and affect    LABORATORY STUDIES  CBC (11/22/2022): WBC 4.19 K (2.9% EOS or AEC 120 cells/microliter - an AEC of 170 cells/microliter is the highest value dating back to 01/09/2011), HCT 30% (MCV 79, RDW high), PLT 194 K.    NT-proBNP:     > 03/23/2017: 819 pg/mL.     > 07/30/2019: 322 pg/mL.    BNP:     > 01/16/2021: 104 pg/mL.     > 09/25/2022: 108 pg/mL.     > 11/12/2022: 98 pg/mL.    Urine Toxicology: prior positive tests for cocaine most recently on 08/06/2017 and amphetamines on 07/17/2017    IMAGING STUDIES  CT Angiogram Chest (11/12/2022):  No pulmonary embolus.     Mild enlargement of the main pulmonary artery suggesting pulmonary hypertension.     Lungs/Pleura: No consolidation, fibrosis, nodule, or edema is identified. No effusion or pneumothorax is noted.       OTHER DIAGNOSTIC STUDIES  There are no prior pulmonary function testing results available for my review.    Nuclear Medicine MUGA Scan (09/28/2022): Normal MUGA study with ejection fraction 59%.     TTE (09/05/2021):    Overall image quality was poor. A contrast agent was administered.   The study was technically difficult because of body habitus and poor acoustic windows.     Left Ventricle: Left ventricle size is normal. Borderline mildly reduced systolic function. Global hypokinesis present.     LV EF is 47 %, assessed by modified Simpson's biplane.     The right ventricle was not well visualized.     There are no significant valvular abnormalities appreciated.     Consider alternative modality for assessment of LV systolic function.     No prior echocardiogram for comparison.            IMPRESSION    PLAN      Marchia Bond, MD  Division of Pulmonary, Critical Care, & Sleep Medicine  Department of Medicine  [facility] [place]

## 2023-04-06 ENCOUNTER — Encounter (INDEPENDENT_AMBULATORY_CARE_PROVIDER_SITE_OTHER): Payer: Medicare HMO | Admitting: Internal Medicine

## 2023-04-10 ENCOUNTER — Emergency Department: Payer: Self-pay

## 2023-04-15 ENCOUNTER — Encounter (INDEPENDENT_AMBULATORY_CARE_PROVIDER_SITE_OTHER): Payer: Self-pay | Admitting: Pulmonary Disease

## 2023-04-20 ENCOUNTER — Other Ambulatory Visit: Payer: Self-pay

## 2023-05-11 ENCOUNTER — Encounter (INDEPENDENT_AMBULATORY_CARE_PROVIDER_SITE_OTHER): Payer: Medicare HMO | Admitting: Internal Medicine

## 2023-06-27 ENCOUNTER — Emergency Department: Payer: Self-pay
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
## 2023-07-14 NOTE — Telephone Encounter (Signed)
Medical Records have been kept for 6 months, placed in shred bin.      Closing TE

## 2023-09-01 ENCOUNTER — Encounter (INDEPENDENT_AMBULATORY_CARE_PROVIDER_SITE_OTHER): Payer: Self-pay

## 2023-09-28 ENCOUNTER — Telehealth: Payer: Self-pay

## 2023-09-28 NOTE — Telephone Encounter (Signed)
 Patient's chart was reviewed and phone call was placed for possible breast cancer screening - patient due with no open referral.      Outcome: Patient is no longer seeing Dr. Emilie Rutter and has established care elsewhere.

## 2023-10-02 ENCOUNTER — Emergency Department: Payer: Self-pay

## 2023-11-13 LAB — OTHER LAB ORDER
Hemoglobin A1C: 5.5 % (ref 4.0–6.0)
Mean Blood Glucose Estimate: 111 mg/dL (ref 65–125)

## 2024-01-09 ENCOUNTER — Emergency Department: Payer: Self-pay

## 2024-01-25 ENCOUNTER — Emergency Department: Payer: Self-pay

## 2024-02-11 ENCOUNTER — Encounter (INDEPENDENT_AMBULATORY_CARE_PROVIDER_SITE_OTHER): Admitting: Student in an Organized Health Care Education/Training Program

## 2024-06-09 ENCOUNTER — Other Ambulatory Visit (INDEPENDENT_AMBULATORY_CARE_PROVIDER_SITE_OTHER): Payer: Self-pay

## 2024-06-17 ENCOUNTER — Encounter (INDEPENDENT_AMBULATORY_CARE_PROVIDER_SITE_OTHER): Admitting: Student in an Organized Health Care Education/Training Program

## 2024-07-10 NOTE — Progress Notes (Deleted)
 ANNUAL WELLNESS VISIT    Sharon Stephens is a 49 year old female who presents for {INITIAL/SUBSEQUENT:126942::a subsequent} Annual Wellness Visit.    Is this a face-to-face video or an audio-only visit?  {COVID Telehealth Yes/No:500210073::No}    INFORMATION GATHERING:   {Tip  REQUIRED; update each of the following and then check box below.  Start Review  Problem List  Medical Hx  Surgical Hx  Family Hx  Social Hx  Reconcile Meds (including outside medications)  Allergies :999}   The following areas were confirmed with patient/caregiver and/or updated in Epic at this visit:   Past Medical, Surgical, Family, and Social History  Current medications and supplements  Allergies  All of the above components have been reviewed and updated: {YES/NO:106416}    Opioid Use: {Tip  Required components for opioid screening  Review PDMP  If receiving opioids, fill out ORT  Previous ORT (if available) No ORT score found :999}  {OPIOID USE:126936::Patient not taking opioids}    {Tip  It is REQUIRED to review substance use disorder risks during an AWV or IPPE encounter :999}   I {HAVE/HAVE NOT:125383::have} reviewed the patient's risk factors for other substance use disorders.  If appropriate, I've referred the patient for treatment.    {Tip  HRA viewable in Synopsis or scanned form in Media tab :999}  Please see today's HRA for review of patient's functional ability and level of safety. Concerns are addressed below in the Personalized Prevention Plan section.    For list of current providers and suppliers, see Care Team Section, EHR encounters, and/or HRA questionnaire for [facility] providers involved in care    Other providers and suppliers outside [facility]:   {OUTSIDE PROVIDERS:126938::No outside providers identified}      Depression screening: {Tip  REQUIRED  Click to input PHQ-9. Then press Ctrl+F5 to refresh note content. :999}   PHQ-9:      {Preventive Health  Screenings:500125001}    Lifestyle:  Diet: {[facility] Preventive Health Diet options:132266}  Calcium : {CALCIUM  INTAKE:101352:::1}  Exercise: {EXERCISE TYPES:100113}    Intimate Partner Violence Screening:  History of sexual or physical abuse: {NO/YES:103635}  History of any other forms of abuse (e.g. verbal, financial): {NO/YES:103635}  Has the patient been hit, kicked, punched, or otherwise hurt by someone within the past year?: {NO/YES:103635}       EXAM:  There were no vitals taken for this visit.  GAIT:   {GAIT EXAM:126939::Normal, stable, independent}    COGNITION: {Tip  REQUIRED for AWV  Consider Cognition Smartset  (contains screening tools such as Mini-Cog (.minicog), cognitive checklist (.cognitivechecklist), and MoCA (here, enter results here for tracking))  :999}    {COGNITION EXAM:126940::Intact}    ADVANCE CARE PLANNING (ACP)  {Tip  Optional. :999}     Advance care planning:  {ACCEPTED/DECLINED:126944}    {ACP COUNSELING (check all that [id number]    Time Spent on ACP:     ASSESSMENT & PLAN:    Sharon Stephens was seen for her Annual Wellness Visit, including identification of risk factors & conditions that may affect her health and function in the future.   Actions at this visit:     Establishing or updating a written schedule of screening and prevention measures recommended and appropriate for Sharon Stephens for the next 5-10 years  Establishing or updating a list of her risk factors and conditions for which lifestyle or medical interventions are recommended or underway, including mental health risks and conditions, and including risks/benefits of treatment  Furnishing personalized health advice and, as appropriate, referrals to health education or preventive counseling services or programs (such as fall prevention, tobacco cessation, physical activity, nutrition, weight loss)    All of the above components have been reviewed and updated: Yes    COUNSELING:   Based on todays evaluation, I  recommend the following ways to improve your health or functioning.    You have the following risk factors and/or medical conditions for which there are recommended ways (included in this list) to help you stay as healthy as possible:      Here are screening & prevention measures recommended for you:    Health Maintenance   Topic Date Due    Hepatitis C Screening  Never done    Hepatitis A Vaccine (1 of 2 - Risk 2-dose series) Never done    Hepatitis B Vaccine (1 of 3 - 19+ 3-dose series) Never done    Cervical Cancer Screening  11/11/2013    Breast Cancer Screening  Never done    Pneumococcal Vaccine: Pediatrics (0-5 years) and At-Risk Patients (6-49 years) (2 of 2 - PCV) 05/13/2019    Depression Screening (PHQ-2)  11/19/2021    Colorectal Cancer Screening  Never done    COVID-19 Vaccine (6 - 2025-26 season) 03/07/2024    Influenza Vaccine (1) 04/06/2024    Medicare Annual Wellness Visit  07/11/2025    Lipid Disorders Screening  11/12/2028    DTaP, Tdap and Td Vaccines (5 - Td or Tdap) 12/20/2031    HIV Screening  Completed    HPV Vaccine (No Doses Required) Completed    Meningococcal B Vaccine  Aged Out       Please plan to have a Subsequent Annual Wellness Visit in 1 year.

## 2024-07-11 ENCOUNTER — Encounter (INDEPENDENT_AMBULATORY_CARE_PROVIDER_SITE_OTHER): Admitting: Critical Care Medicine

## 2024-07-11 ENCOUNTER — Other Ambulatory Visit (INDEPENDENT_AMBULATORY_CARE_PROVIDER_SITE_OTHER): Payer: Self-pay | Admitting: Critical Care Medicine

## 2024-08-19 ENCOUNTER — Encounter (INDEPENDENT_AMBULATORY_CARE_PROVIDER_SITE_OTHER)
